# Patient Record
Sex: Female | Born: 1960 | Race: White | Hispanic: No | Marital: Single | Smoking: Former smoker
Health system: Southern US, Community
[De-identification: ages and names within clinical notes are randomized; demographics above are authoritative.]

## PROBLEM LIST (undated history)

## (undated) DIAGNOSIS — R200 Anesthesia of skin: Secondary | ICD-10-CM

## (undated) DIAGNOSIS — K589 Irritable bowel syndrome without diarrhea: Secondary | ICD-10-CM

## (undated) DIAGNOSIS — F419 Anxiety disorder, unspecified: Secondary | ICD-10-CM

## (undated) DIAGNOSIS — I749 Embolism and thrombosis of unspecified artery: Secondary | ICD-10-CM

## (undated) DIAGNOSIS — E039 Hypothyroidism, unspecified: Secondary | ICD-10-CM

## (undated) DIAGNOSIS — R1310 Dysphagia, unspecified: Secondary | ICD-10-CM

## (undated) DIAGNOSIS — F329 Major depressive disorder, single episode, unspecified: Secondary | ICD-10-CM

## (undated) DIAGNOSIS — M1990 Unspecified osteoarthritis, unspecified site: Secondary | ICD-10-CM

## (undated) DIAGNOSIS — Z936 Other artificial openings of urinary tract status: Secondary | ICD-10-CM

## (undated) DIAGNOSIS — C801 Malignant (primary) neoplasm, unspecified: Secondary | ICD-10-CM

## (undated) DIAGNOSIS — M069 Rheumatoid arthritis, unspecified: Secondary | ICD-10-CM

## (undated) DIAGNOSIS — K222 Esophageal obstruction: Secondary | ICD-10-CM

## (undated) DIAGNOSIS — K635 Polyp of colon: Secondary | ICD-10-CM

## (undated) DIAGNOSIS — F32A Depression, unspecified: Secondary | ICD-10-CM

## (undated) DIAGNOSIS — G629 Polyneuropathy, unspecified: Secondary | ICD-10-CM

## (undated) DIAGNOSIS — E785 Hyperlipidemia, unspecified: Secondary | ICD-10-CM

## (undated) DIAGNOSIS — K08109 Complete loss of teeth, unspecified cause, unspecified class: Secondary | ICD-10-CM

## (undated) DIAGNOSIS — K5900 Constipation, unspecified: Secondary | ICD-10-CM

## (undated) DIAGNOSIS — I10 Essential (primary) hypertension: Secondary | ICD-10-CM

## (undated) DIAGNOSIS — Z972 Presence of dental prosthetic device (complete) (partial): Secondary | ICD-10-CM

## (undated) DIAGNOSIS — R002 Palpitations: Secondary | ICD-10-CM

## (undated) DIAGNOSIS — R079 Chest pain, unspecified: Secondary | ICD-10-CM

## (undated) DIAGNOSIS — K219 Gastro-esophageal reflux disease without esophagitis: Secondary | ICD-10-CM

## (undated) DIAGNOSIS — R221 Localized swelling, mass and lump, neck: Secondary | ICD-10-CM

## (undated) DIAGNOSIS — R202 Paresthesia of skin: Secondary | ICD-10-CM

## (undated) DIAGNOSIS — Z973 Presence of spectacles and contact lenses: Secondary | ICD-10-CM

## (undated) DIAGNOSIS — K649 Unspecified hemorrhoids: Secondary | ICD-10-CM

## (undated) HISTORY — PX: TONSILLECTOMY: SUR1361

## (undated) HISTORY — DX: Rheumatoid arthritis, unspecified: M06.9

## (undated) HISTORY — DX: Malignant (primary) neoplasm, unspecified: C80.1

## (undated) HISTORY — DX: Depression, unspecified: F32.A

## (undated) HISTORY — DX: Polyp of colon: K63.5

## (undated) HISTORY — DX: Essential (primary) hypertension: I10

## (undated) HISTORY — PX: UPPER GI ENDOSCOPY: SHX6162

## (undated) HISTORY — DX: Chest pain, unspecified: R07.9

## (undated) HISTORY — DX: Dysphagia, unspecified: R13.10

## (undated) HISTORY — DX: Embolism and thrombosis of unspecified artery: I74.9

## (undated) HISTORY — DX: Hyperlipidemia, unspecified: E78.5

## (undated) HISTORY — PX: THYROID SURGERY: SHX805

## (undated) HISTORY — DX: Gastro-esophageal reflux disease without esophagitis: K21.9

## (undated) HISTORY — PX: ABDOMINAL HYSTERECTOMY: SHX81

## (undated) HISTORY — PX: ABLATION: SHX5711

## (undated) HISTORY — DX: Constipation, unspecified: K59.00

## (undated) HISTORY — DX: Major depressive disorder, single episode, unspecified: F32.9

## (undated) HISTORY — PX: COLONOSCOPY: SHX174

## (undated) HISTORY — DX: Localized swelling, mass and lump, neck: R22.1

## (undated) HISTORY — DX: Palpitations: R00.2

---

## 1998-10-18 ENCOUNTER — Emergency Department (HOSPITAL_COMMUNITY): Admission: EM | Admit: 1998-10-18 | Discharge: 1998-10-18 | Payer: Self-pay | Admitting: Emergency Medicine

## 2000-10-21 ENCOUNTER — Emergency Department (HOSPITAL_COMMUNITY): Admission: EM | Admit: 2000-10-21 | Discharge: 2000-10-21 | Payer: Self-pay | Admitting: Emergency Medicine

## 2000-10-21 ENCOUNTER — Encounter: Payer: Self-pay | Admitting: Emergency Medicine

## 2004-09-25 ENCOUNTER — Ambulatory Visit: Payer: Self-pay | Admitting: Internal Medicine

## 2007-03-15 ENCOUNTER — Emergency Department: Payer: Self-pay

## 2007-03-15 ENCOUNTER — Other Ambulatory Visit: Payer: Self-pay

## 2007-06-22 ENCOUNTER — Ambulatory Visit: Payer: Self-pay | Admitting: Family Medicine

## 2007-06-22 DIAGNOSIS — E663 Overweight: Secondary | ICD-10-CM | POA: Insufficient documentation

## 2007-06-22 DIAGNOSIS — F329 Major depressive disorder, single episode, unspecified: Secondary | ICD-10-CM | POA: Insufficient documentation

## 2007-06-22 DIAGNOSIS — F17200 Nicotine dependence, unspecified, uncomplicated: Secondary | ICD-10-CM | POA: Insufficient documentation

## 2007-06-22 DIAGNOSIS — I10 Essential (primary) hypertension: Secondary | ICD-10-CM | POA: Insufficient documentation

## 2007-06-22 DIAGNOSIS — K219 Gastro-esophageal reflux disease without esophagitis: Secondary | ICD-10-CM | POA: Insufficient documentation

## 2007-06-22 DIAGNOSIS — M1990 Unspecified osteoarthritis, unspecified site: Secondary | ICD-10-CM | POA: Insufficient documentation

## 2007-06-23 ENCOUNTER — Encounter: Payer: Self-pay | Admitting: Family Medicine

## 2007-06-23 LAB — CONVERTED CEMR LAB
ALT: 14 units/L (ref 0–35)
AST: 15 units/L (ref 0–37)
Albumin: 3.7 g/dL (ref 3.5–5.2)
Alkaline Phosphatase: 78 units/L (ref 39–117)
BUN: 7 mg/dL (ref 6–23)
Basophils Absolute: 0 10*3/uL (ref 0.0–0.1)
Basophils Relative: 0.3 % (ref 0.0–1.0)
Bilirubin, Direct: 0.2 mg/dL (ref 0.0–0.3)
CO2: 29 meq/L (ref 19–32)
Calcium: 9 mg/dL (ref 8.4–10.5)
Chloride: 105 meq/L (ref 96–112)
Cholesterol: 218 mg/dL (ref 0–200)
Creatinine, Ser: 0.7 mg/dL (ref 0.4–1.2)
Direct LDL: 167.2 mg/dL
Eosinophils Absolute: 0.2 10*3/uL (ref 0.0–0.6)
Eosinophils Relative: 3.3 % (ref 0.0–5.0)
GFR calc Af Amer: 115 mL/min
GFR calc non Af Amer: 95 mL/min
Glucose, Bld: 73 mg/dL (ref 70–99)
HCT: 42 % (ref 36.0–46.0)
HDL: 23.6 mg/dL — ABNORMAL LOW (ref >39.0)
Hemoglobin: 13.9 g/dL (ref 12.0–15.0)
Lymphocytes Relative: 34.8 % (ref 12.0–46.0)
MCHC: 33 g/dL (ref 30.0–36.0)
MCV: 89.1 fL (ref 78.0–100.0)
Monocytes Absolute: 0.4 10*3/uL (ref 0.2–0.7)
Monocytes Relative: 5.5 % (ref 3.0–11.0)
Neutro Abs: 4.2 10*3/uL (ref 1.4–7.7)
Neutrophils Relative %: 56.1 % (ref 43.0–77.0)
Platelets: 380 10*3/uL (ref 150–400)
Potassium: 4.1 meq/L (ref 3.5–5.1)
RBC: 4.71 M/uL (ref 3.87–5.11)
RDW: 12.8 % (ref 11.5–14.6)
Sodium: 142 meq/L (ref 135–145)
TSH: 1.88 microintl units/mL (ref 0.35–5.50)
Total Bilirubin: 0.7 mg/dL (ref 0.3–1.2)
Total CHOL/HDL Ratio: 9.2
Total Protein: 6.1 g/dL (ref 6.0–8.3)
Triglycerides: 150 mg/dL — ABNORMAL HIGH (ref 0–149)
VLDL: 30 mg/dL (ref 0–40)
WBC: 7.4 10*3/uL (ref 4.5–10.5)

## 2007-07-22 ENCOUNTER — Ambulatory Visit: Payer: Self-pay | Admitting: Family Medicine

## 2008-06-16 ENCOUNTER — Encounter: Admission: RE | Admit: 2008-06-16 | Discharge: 2008-06-16 | Payer: Self-pay | Admitting: Family Medicine

## 2009-07-05 ENCOUNTER — Encounter: Admission: RE | Admit: 2009-07-05 | Discharge: 2009-07-05 | Payer: Self-pay | Admitting: Family Medicine

## 2010-04-21 ENCOUNTER — Emergency Department: Payer: Self-pay | Admitting: Emergency Medicine

## 2010-06-18 NOTE — Assessment & Plan Note (Signed)
Summary: new to be est patient fasting/mhf   Vital Signs:  Patient Profile:   50 Years Old Female Height:     68 inches Weight:      213 pounds Temp:     97.5 degrees F oral Pulse rate:   78 / minute Pulse rhythm:   regular BP sitting:   172 / 114  (left arm) Cuff size:   large  Vitals Entered By: Alfred Levins, CMA (June 22, 2007 10:42 AM)                 Chief Complaint:  establish.  History of Present Illness: 50 yr old female to establish with [place]. Has not had a primary care doctor for at least 10 years. Feels good. Wants to quit smoking and lose some weight. Has a 3 month supply of Chantix at home which she plans to start taking soon. Is fasting today.   Current Allergies (reviewed today): No known allergies   Past Medical History:    Reviewed history and no changes required:       Depression       GERD       Hypertension       Osteoarthritis  Past Surgical History:    Reviewed history and no changes required:       Tonsillectomy   Family History:    Reviewed history and no changes required:       Family History Diabetes 1st degree relative       Family History High cholesterol       Family History Hypertension       Family History of Stroke M 1st degree relative <50       Family History of Heart disease  Social History:    Reviewed history and no changes required:       Single       Current Smoker       Alcohol use-no       Drug use-no       Regular exercise-no   Risk Factors:  Tobacco use:  current Drug use:  no Alcohol use:  no Exercise:  no   Review of Systems  The patient denies anorexia, fever, weight loss, vision loss, decreased hearing, hoarseness, chest pain, syncope, dyspnea on exhertion, peripheral edema, prolonged cough, hemoptysis, abdominal pain, melena, hematochezia, severe indigestion/heartburn, hematuria, incontinence, genital sores, muscle weakness, suspicious skin lesions, transient blindness, difficulty walking,  depression, unusual weight change, abnormal bleeding, enlarged lymph nodes, angioedema, breast masses, and testicular masses.     Physical Exam  General:     overweight-appearing.   Neck:     No deformities, masses, or tenderness noted. Lungs:     Normal respiratory effort, chest expands symmetrically. Lungs are clear to auscultation, no crackles or wheezes. Heart:     Normal rate and regular rhythm. S1 and S2 normal without gallop, murmur, click, rub or other extra sounds.    Impression & Recommendations:  Problem # 1:  HYPERTENSION (ICD-401.9)  Orders: UA Dipstick w/o Micro (81002) Venipuncture (91478) TLB-Lipid Panel (80061-LIPID) TLB-BMP (Basic Metabolic Panel-BMET) (80048-METABOL) TLB-CBC Platelet - w/Differential (85025-CBCD) TLB-Hepatic/Liver Function Pnl (80076-HEPATIC) TLB-TSH (Thyroid Stimulating Hormone) (84443-TSH)  Her updated medication list for this problem includes:    Diovan Hct 160-25 Mg Tabs (Valsartan-hydrochlorothiazide) ..... Once daily   Problem # 2:  NICOTINE ADDICTION (ICD-305.1)  Problem # 3:  OVERWEIGHT (ICD-278.02)  Complete Medication List: 1)  Diovan Hct 160-25 Mg Tabs (Valsartan-hydrochlorothiazide) .... Once daily  Patient Instructions: 1)  We discussed the importance of getting exercise, watching her diet, and losing weight. Encouraged her to begin Chantix as well as a daily nicotine patch to stop smoking. Will get labs today. Start Diovan HCT. Recheck with cpx in 3-4 weeks.    Prescriptions: DIOVAN HCT 160-25 MG  TABS (VALSARTAN-HYDROCHLOROTHIAZIDE) once daily  #28 x 0   Entered and Authorized by:   Nelwyn Salisbury MD   Signed by:   Nelwyn Salisbury MD on 06/22/2007   Method used:   Samples Given   RxID:   [id number]  ]  Appended Document: Orders Update    Clinical Lists Changes  Observations: Added new observation of COMMENTS: ..................................................................Marland KitchenWynona Canes, CMA   June 23, 2007 8:24 AM  (06/23/2007 8:24) Added new observation of PH URINE: 6.0  (06/23/2007 8:24) Added new observation of SPEC GR URIN: 1.020  (06/23/2007 8:24) Added new observation of APPEARANCE U: Clear  (06/23/2007 8:24) Added new observation of UA COLOR: yellow  (06/23/2007 8:24) Added new observation of WBC DIPSTK U: negative  (06/23/2007 8:24) Added new observation of NITRITE URN: negative  (06/23/2007 8:24) Added new observation of UROBILINOGEN: 0.2  (06/23/2007 8:24) Added new observation of PROTEIN, URN: negative  (06/23/2007 8:24) Added new observation of BLOOD UR DIP: 2+  (06/23/2007 8:24) Added new observation of KETONES URN: negative  (06/23/2007 8:24) Added new observation of BILIRUBIN UR: negative  (06/23/2007 8:24) Added new observation of GLUCOSE, URN: 100  (06/23/2007 8:24)       Laboratory Results   Urine Tests   Date/Time Reported: June 23, 2007 8:24 AM   Routine Urinalysis   Color: yellow Appearance: Clear Glucose: 100   (Normal Range: Negative) Bilirubin: negative   (Normal Range: Negative) Ketone: negative   (Normal Range: Negative) Spec. Gravity: 1.020   (Normal Range: 1.003-1.035) Blood: 2+   (Normal Range: Negative) pH: 6.0   (Normal Range: 5.0-8.0) Protein: negative   (Normal Range: Negative) Urobilinogen: 0.2   (Normal Range: 0-1) Nitrite: negative   (Normal Range: Negative) Leukocyte Esterace: negative   (Normal Range: Negative)    Comments: ..................................................................Marland KitchenWynona Canes, CMA  June 23, 2007 8:24 AM

## 2010-06-18 NOTE — Letter (Signed)
Summary: Lipid Letter  [facility]  [address] [place], [place] [address]   Phone: [phone]  Fax: [phone]    06/23/2007  Donna [name] [address] [place], [place]  [address]  Dear Donna [name]:  We have carefully reviewed your last lipid profile from 06/22/2007 and the results are noted below with a summary of recommendations for lipid management.    Cholesterol:       218     Goal: <   HDL "good" Cholesterol:   29.5     Goal: >   LDL "bad" Cholesterol:   DEL     Goal: <   Triglycerides:       150     Goal: <    Your labs are normal except your cholesterol is high.  Please watch your diet.    TLC Diet (Therapeutic Lifestyle Change): Saturated Fats & Transfatty acids should be kept < 7% of total calories ***Reduce Saturated Fats Polyunstaurated Fat can be up to 10% of total calories Monounsaturated Fat Fat can be up to 20% of total calories Total Fat should be no greater than 25-35% of total calories Carbohydrates should be 50-60% of total calories Protein should be approximately 15% of total calories Fiber should be at least 20-30 grams a day ***Increased fiber may help lower LDL Total Cholesterol should be < 200mg /day Consider adding plant stanol/sterols to diet (example: Benacol spread) ***A higher intake of unsaturated fat may reduce Triglycerides and Increase HDL    Adjunctive Measures (may lower LIPIDS and reduce risk of Heart Attack) include: Aerobic Exercise (20-30 minutes 3-4 times a week) Limit Alcohol Consumption Weight Reduction Aspirin 75-81 mg a day by mouth (if not allergic or contraindicated) Vitamin E 400 IU a day by mouth Folic Acid 1mg  a day by mouth Dietary Fiber 20-30 grams a day by mouth   If you have any questions, please call. We appreciate being able to work with you.   Sincerely,    [place] [facility] [employer]

## 2010-06-18 NOTE — Assessment & Plan Note (Signed)
Summary: cpx/pap/njr ok per doc/njr   Vital Signs:  Patient Profile:   50 Years Old Female Height:     68 inches Weight:      213 pounds Temp:     97.9 degrees F oral Pulse rate:   78 / minute Pulse rhythm:   regular BP sitting:   112 / 74  (left arm) Cuff size:   large  Vitals Entered By: Alfred Levins, CMA (July 22, 2007 10:12 AM)                 Chief Complaint:  bp check.  History of Present Illness: Here to follow up HTN. Has been on Diovan HCT, and is doing well. BP is well controlled and she feels fine. Recent labs were normal except high cholesterol.    Current Allergies: No known allergies   Past Medical History:    Reviewed history from 06/22/2007 and no changes required:       Depression       GERD       Hypertension       Osteoarthritis     Review of Systems  The patient denies anorexia, fever, weight loss, weight gain, vision loss, decreased hearing, hoarseness, chest pain, syncope, dyspnea on exhertion, peripheral edema, prolonged cough, hemoptysis, abdominal pain, melena, hematochezia, severe indigestion/heartburn, hematuria, incontinence, genital sores, muscle weakness, suspicious skin lesions, transient blindness, difficulty walking, depression, unusual weight change, abnormal bleeding, enlarged lymph nodes, angioedema, breast masses, and testicular masses.     Physical Exam  General:     Well-developed,well-nourished,in no acute distress; alert,appropriate and cooperative throughout examination    Impression & Recommendations:  Problem # 1:  HYPERTENSION (ICD-401.9)  Her updated medication list for this problem includes:    Diovan Hct 160-25 Mg Tabs (Valsartan-hydrochlorothiazide) ..... Once daily   Complete Medication List: 1)  Diovan Hct 160-25 Mg Tabs (Valsartan-hydrochlorothiazide) .... Once daily   Patient Instructions: 1)  Continue meds, diet, and exercise. Plan to do cpx soon.    Prescriptions: DIOVAN HCT 160-25 MG  TABS  (VALSARTAN-HYDROCHLOROTHIAZIDE) once daily  #30 x 11   Entered and Authorized by:   Nelwyn Salisbury MD   Signed by:   Nelwyn Salisbury MD on 07/22/2007   Method used:   Electronically sent to ...       [facility]       [address]       [place], [place]  [address]       Ph: [phone]       Fax: [phone]   RxID:   [id number]  ]

## 2010-07-18 LAB — HM COLONOSCOPY

## 2011-06-17 ENCOUNTER — Inpatient Hospital Stay: Payer: Self-pay | Admitting: Internal Medicine

## 2011-06-17 LAB — BASIC METABOLIC PANEL
Anion Gap: 10 (ref 7–16)
BUN: 11 mg/dL (ref 7–18)
Calcium, Total: 9.2 mg/dL (ref 8.5–10.1)
Chloride: 104 mmol/L (ref 98–107)
Co2: 29 mmol/L (ref 21–32)
Creatinine: 0.74 mg/dL (ref 0.60–1.30)
EGFR (African American): >60
EGFR (Non-African Amer.): >60
Glucose: 96 mg/dL (ref 65–99)
Osmolality: 284 (ref 275–301)
Potassium: 3.1 mmol/L — ABNORMAL LOW (ref 3.5–5.1)
Sodium: 143 mmol/L (ref 136–145)

## 2011-06-17 LAB — CBC
HCT: 40.8 % (ref 35.0–47.0)
HGB: 14 g/dL (ref 12.0–16.0)
MCH: 29.9 pg (ref 26.0–34.0)
MCHC: 34.3 g/dL (ref 32.0–36.0)
MCV: 87 fL (ref 80–100)
Platelet: 354 10*3/uL (ref 150–440)
RBC: 4.67 10*6/uL (ref 3.80–5.20)
RDW: 13.4 % (ref 11.5–14.5)
WBC: 9.5 10*3/uL (ref 3.6–11.0)

## 2011-06-17 LAB — TROPONIN I: Troponin-I: 0.06 ng/mL — ABNORMAL HIGH

## 2011-06-18 DIAGNOSIS — R079 Chest pain, unspecified: Secondary | ICD-10-CM

## 2011-06-18 LAB — CK TOTAL AND CKMB (NOT AT ARMC)
CK, Total: 65 U/L (ref 21–215)
CK, Total: 49 U/L (ref 21–215)
CK, Total: 51 U/L (ref 21–215)
CK-MB: 1.3 ng/mL (ref 0.5–3.6)
CK-MB: 1.2 ng/mL (ref 0.5–3.6)
CK-MB: 0.9 ng/mL (ref 0.5–3.6)

## 2011-06-18 LAB — LIPID PANEL
Cholesterol: 183 mg/dL (ref 0–200)
HDL Cholesterol: 28 mg/dL — ABNORMAL LOW (ref 40–60)
Ldl Cholesterol, Calc: 128 mg/dL — ABNORMAL HIGH (ref 0–100)
Triglycerides: 133 mg/dL (ref 0–200)
VLDL Cholesterol, Calc: 27 mg/dL (ref 5–40)

## 2011-06-18 LAB — TROPONIN I
Troponin-I: 0.06 ng/mL — ABNORMAL HIGH
Troponin-I: 0.11 ng/mL — ABNORMAL HIGH

## 2011-09-09 ENCOUNTER — Other Ambulatory Visit: Payer: Self-pay | Admitting: Family Medicine

## 2011-09-09 DIAGNOSIS — E0590 Thyrotoxicosis, unspecified without thyrotoxic crisis or storm: Secondary | ICD-10-CM

## 2011-09-17 ENCOUNTER — Encounter (HOSPITAL_COMMUNITY)
Admission: RE | Admit: 2011-09-17 | Discharge: 2011-09-17 | Disposition: A | Discharge status: Home / Self Care | Payer: PRIVATE HEALTH INSURANCE | Source: Ambulatory Visit | Attending: Family Medicine | Admitting: Family Medicine

## 2011-09-17 DIAGNOSIS — E0590 Thyrotoxicosis, unspecified without thyrotoxic crisis or storm: Secondary | ICD-10-CM | POA: Insufficient documentation

## 2011-09-18 ENCOUNTER — Encounter (HOSPITAL_COMMUNITY)
Admission: RE | Admit: 2011-09-18 | Discharge: 2011-09-18 | Disposition: A | Discharge status: Home / Self Care | Payer: PRIVATE HEALTH INSURANCE | Source: Ambulatory Visit | Attending: Family Medicine | Admitting: Family Medicine

## 2011-09-18 MED ORDER — SODIUM IODIDE I 131 CAPSULE
8.0000 | Freq: Once | INTRAVENOUS | Status: AC | PRN
  Administered 2011-09-17: 8 via ORAL

## 2011-09-18 MED ORDER — SODIUM PERTECHNETATE TC 99M INJECTION
10.6000 | Freq: Once | INTRAVENOUS | Status: AC | PRN
  Administered 2011-09-18: 10.6 via INTRAVENOUS

## 2011-09-22 ENCOUNTER — Other Ambulatory Visit: Payer: Self-pay | Admitting: Family Medicine

## 2011-09-22 DIAGNOSIS — E042 Nontoxic multinodular goiter: Secondary | ICD-10-CM

## 2011-09-25 ENCOUNTER — Ambulatory Visit
Admission: RE | Admit: 2011-09-25 | Discharge: 2011-09-25 | Disposition: A | Discharge status: Home / Self Care | Payer: PRIVATE HEALTH INSURANCE | Source: Ambulatory Visit | Attending: Family Medicine | Admitting: Family Medicine

## 2011-09-25 DIAGNOSIS — E042 Nontoxic multinodular goiter: Secondary | ICD-10-CM

## 2011-09-30 ENCOUNTER — Other Ambulatory Visit: Payer: Self-pay | Admitting: Family Medicine

## 2011-09-30 DIAGNOSIS — E042 Nontoxic multinodular goiter: Secondary | ICD-10-CM

## 2011-10-08 ENCOUNTER — Ambulatory Visit
Admission: RE | Admit: 2011-10-08 | Discharge: 2011-10-08 | Disposition: A | Discharge status: Home / Self Care | Payer: PRIVATE HEALTH INSURANCE | Source: Ambulatory Visit | Attending: Family Medicine | Admitting: Family Medicine

## 2011-10-08 ENCOUNTER — Other Ambulatory Visit (HOSPITAL_COMMUNITY)
Admission: RE | Admit: 2011-10-08 | Discharge: 2011-10-08 | Disposition: A | Discharge status: Home / Self Care | Payer: PRIVATE HEALTH INSURANCE | Source: Ambulatory Visit | Attending: Diagnostic Radiology | Admitting: Diagnostic Radiology

## 2011-10-08 DIAGNOSIS — E042 Nontoxic multinodular goiter: Secondary | ICD-10-CM

## 2011-10-08 DIAGNOSIS — E041 Nontoxic single thyroid nodule: Secondary | ICD-10-CM | POA: Insufficient documentation

## 2011-10-15 ENCOUNTER — Other Ambulatory Visit: Payer: Self-pay | Admitting: Family Medicine

## 2011-10-15 DIAGNOSIS — E0590 Thyrotoxicosis, unspecified without thyrotoxic crisis or storm: Secondary | ICD-10-CM

## 2011-10-23 ENCOUNTER — Ambulatory Visit (HOSPITAL_COMMUNITY): Payer: PRIVATE HEALTH INSURANCE

## 2011-11-04 ENCOUNTER — Encounter (HOSPITAL_COMMUNITY)
Admission: RE | Admit: 2011-11-04 | Discharge: 2011-11-04 | Disposition: A | Discharge status: Home / Self Care | Payer: PRIVATE HEALTH INSURANCE | Source: Ambulatory Visit | Attending: Family Medicine | Admitting: Family Medicine

## 2011-11-04 DIAGNOSIS — E0590 Thyrotoxicosis, unspecified without thyrotoxic crisis or storm: Secondary | ICD-10-CM | POA: Insufficient documentation

## 2011-11-04 MED ORDER — SODIUM IODIDE I 131 CAPSULE
14.8800 | Freq: Once | INTRAVENOUS | Status: AC | PRN
  Administered 2011-11-04: 14.88 via ORAL

## 2012-04-13 ENCOUNTER — Encounter (INDEPENDENT_AMBULATORY_CARE_PROVIDER_SITE_OTHER): Payer: Self-pay | Admitting: General Surgery

## 2012-04-20 ENCOUNTER — Ambulatory Visit (INDEPENDENT_AMBULATORY_CARE_PROVIDER_SITE_OTHER): Payer: No Typology Code available for payment source | Admitting: General Surgery

## 2012-04-20 ENCOUNTER — Encounter (INDEPENDENT_AMBULATORY_CARE_PROVIDER_SITE_OTHER): Payer: Self-pay | Admitting: General Surgery

## 2012-04-20 VITALS — BP 126/78 | HR 76 | Temp 97.9°F | Resp 16 | Ht 67.5 in | Wt 219.1 lb

## 2012-04-20 DIAGNOSIS — L723 Sebaceous cyst: Secondary | ICD-10-CM

## 2012-04-20 DIAGNOSIS — L720 Epidermal cyst: Secondary | ICD-10-CM

## 2012-04-20 NOTE — Progress Notes (Signed)
Patient ID: Donna [name], female   DOB: August 30, 1960, 51 y.o.   MRN: [id number]  Chief Complaint  Patient presents with  . Mass    Neck    HPI Donna [name] is a 51 y.o. female.  The patient is referred by Dr. Gailen Shelter for a left neck cyst. Patient has since been there for several years. She states that it becomes erythematous but has never drained. She states is no pain involved. She does have a history of having cyst removed on her scalp. HPI  Past Medical History  Diagnosis Date  . Hypertension   . Colon polyp   . Graves disease   . GERD (gastroesophageal reflux disease)   . Hyperlipidemia   . Palpitations   . Constipation   . Neck mass   . Trouble swallowing   . Chest pain     Past Surgical History  Procedure Date  . Tonsillectomy   . Thyroid surgery 10/2011 - approximate    ablation    Family History  Problem Relation Age of Onset  . Heart attack Mother   . CVA Father   . Cancer Brother     lung cancer - stage 4  . Cancer Brother     skin     Social History History  Substance Use Topics  . Smoking status: Former Smoker    Quit date: 06/19/2011  . Smokeless tobacco: Not on file  . Alcohol Use: No    No Known Allergies  Current Outpatient Prescriptions  Medication Sig Dispense Refill  . amLODipine (NORVASC) 5 MG tablet Take 5 mg by mouth daily.      . hydrochlorothiazide (MICROZIDE) 12.5 MG capsule Take 12.5 mg by mouth daily.      Donna Kitchen levothyroxine (SYNTHROID, LEVOTHROID) 100 MCG tablet Take 100 mcg by mouth daily.      . simvastatin (ZOCOR) 20 MG tablet Take 20 mg by mouth every evening.        Review of Systems Review of Systems  All other systems reviewed and are negative.    Blood pressure 126/78, pulse 76, temperature 97.9 F (36.6 C), temperature source Temporal, resp. rate 16, height 5' 7.5" (1.715 m), weight 219 lb 2 oz (99.394 kg).  Physical Exam Physical Exam  Constitutional: She appears well-developed and well-nourished.  HENT:  Head:  Normocephalic and atraumatic.  Eyes: Conjunctivae normal and EOM are normal. Pupils are equal, round, and reactive to light.  Neck: Normal range of motion. Neck supple.    Cardiovascular: Normal rate, regular rhythm and normal heart sounds.   Pulmonary/Chest: Effort normal and breath sounds normal.  Abdominal: Soft. Bowel sounds are normal.  Musculoskeletal: Normal range of motion.    Data Reviewed none  Assessment    51year-old female with a left subcutaneous cyst approximately 1 x 2 cm    Plan    1. We'll proceed to the operating room for excision of the left neck subcutaneous cyst.  2. All risks and benefits were discussed with the patient, to generally include infection, bleeding, damage to surrounding structures, and recurrence. Alternatives were offered and described.  All questions were answered and the patient voiced understanding of the procedure and wishes to proceed at this point.        Marigene [name]., Donna [name] 04/20/2012, 3:27 PM

## 2012-04-21 ENCOUNTER — Ambulatory Visit (INDEPENDENT_AMBULATORY_CARE_PROVIDER_SITE_OTHER): Payer: PRIVATE HEALTH INSURANCE | Admitting: General Surgery

## 2012-05-07 ENCOUNTER — Encounter (HOSPITAL_BASED_OUTPATIENT_CLINIC_OR_DEPARTMENT_OTHER): Payer: Self-pay | Admitting: *Deleted

## 2012-05-07 NOTE — Progress Notes (Signed)
To come in for bmet 

## 2012-05-10 ENCOUNTER — Ambulatory Visit (HOSPITAL_COMMUNITY)
Admission: RE | Admit: 2012-05-10 | Discharge: 2012-05-10 | Disposition: A | Discharge status: Home / Self Care | Payer: PRIVATE HEALTH INSURANCE | Source: Ambulatory Visit | Attending: Anesthesiology | Admitting: Anesthesiology

## 2012-05-10 ENCOUNTER — Encounter (HOSPITAL_COMMUNITY): Payer: Self-pay | Admitting: Pharmacy Technician

## 2012-05-10 ENCOUNTER — Other Ambulatory Visit (HOSPITAL_COMMUNITY): Payer: PRIVATE HEALTH INSURANCE

## 2012-05-10 ENCOUNTER — Encounter (HOSPITAL_COMMUNITY)
Admission: RE | Admit: 2012-05-10 | Discharge: 2012-05-10 | Disposition: A | Discharge status: Home / Self Care | Payer: PRIVATE HEALTH INSURANCE | Source: Ambulatory Visit | Attending: General Surgery | Admitting: General Surgery

## 2012-05-10 ENCOUNTER — Encounter (HOSPITAL_COMMUNITY): Payer: Self-pay

## 2012-05-10 DIAGNOSIS — Z01810 Encounter for preprocedural cardiovascular examination: Secondary | ICD-10-CM | POA: Insufficient documentation

## 2012-05-10 DIAGNOSIS — I10 Essential (primary) hypertension: Secondary | ICD-10-CM | POA: Insufficient documentation

## 2012-05-10 DIAGNOSIS — Z01818 Encounter for other preprocedural examination: Secondary | ICD-10-CM | POA: Insufficient documentation

## 2012-05-10 DIAGNOSIS — R05 Cough: Secondary | ICD-10-CM | POA: Insufficient documentation

## 2012-05-10 DIAGNOSIS — R059 Cough, unspecified: Secondary | ICD-10-CM | POA: Insufficient documentation

## 2012-05-10 DIAGNOSIS — Z87891 Personal history of nicotine dependence: Secondary | ICD-10-CM | POA: Insufficient documentation

## 2012-05-10 DIAGNOSIS — Z01812 Encounter for preprocedural laboratory examination: Secondary | ICD-10-CM | POA: Insufficient documentation

## 2012-05-10 DIAGNOSIS — I517 Cardiomegaly: Secondary | ICD-10-CM | POA: Insufficient documentation

## 2012-05-10 HISTORY — DX: Irritable bowel syndrome, unspecified: K58.9

## 2012-05-10 HISTORY — DX: Paresthesia of skin: R20.2

## 2012-05-10 HISTORY — DX: Esophageal obstruction: K22.2

## 2012-05-10 HISTORY — DX: Unspecified osteoarthritis, unspecified site: M19.90

## 2012-05-10 HISTORY — DX: Polyneuropathy, unspecified: G62.9

## 2012-05-10 HISTORY — DX: Unspecified hemorrhoids: K64.9

## 2012-05-10 HISTORY — DX: Anesthesia of skin: R20.0

## 2012-05-10 HISTORY — DX: Anxiety disorder, unspecified: F41.9

## 2012-05-10 HISTORY — DX: Hypothyroidism, unspecified: E03.9

## 2012-05-10 LAB — BASIC METABOLIC PANEL
BUN: 16 mg/dL (ref 6–23)
CO2: 29 mEq/L (ref 19–32)
Calcium: 9.7 mg/dL (ref 8.4–10.5)
Chloride: 101 mEq/L (ref 96–112)
Creatinine, Ser: 0.87 mg/dL (ref 0.50–1.10)
GFR calc Af Amer: 87 mL/min — ABNORMAL LOW (ref >90)
GFR calc non Af Amer: 75 mL/min — ABNORMAL LOW (ref >90)
Glucose, Bld: 91 mg/dL (ref 70–99)
Potassium: 3.7 mEq/L (ref 3.5–5.1)
Sodium: 140 mEq/L (ref 135–145)

## 2012-05-10 LAB — CBC
HCT: 38.3 % (ref 36.0–46.0)
Hemoglobin: 13.1 g/dL (ref 12.0–15.0)
MCH: 28.9 pg (ref 26.0–34.0)
MCHC: 34.2 g/dL (ref 30.0–36.0)
MCV: 84.4 fL (ref 78.0–100.0)
Platelets: 329 10*3/uL (ref 150–400)
RBC: 4.54 MIL/uL (ref 3.87–5.11)
RDW: 12.4 % (ref 11.5–15.5)
WBC: 10 10*3/uL (ref 4.0–10.5)

## 2012-05-10 LAB — SURGICAL PCR SCREEN
MRSA, PCR: NEGATIVE
Staphylococcus aureus: POSITIVE — AB

## 2012-05-10 NOTE — Progress Notes (Addendum)
Pt was seen at [facility] in  January of this year with chest pain.  Pt said EKG was abnormal and 2 sets of Tropin were elevated.  Pt had a stress test during her stay and was seen by a cardiologist.  Pt did not have to have a follow up with a cardiologist.  Pt denies chest pain and has quit smoking 06/19/11.    I requested discarge notes and stress test and EKG and echo from [facility] and left chart for Revonda Standard to  review.

## 2012-05-10 NOTE — Pre-Procedure Instructions (Signed)
20 JAIDYNN BALSTER  05/10/2012   Your procedure is scheduled on:  Thursday, December 26th.  Report to [facility] at 11:00 AM.  Call this number if you have problems the morning of surgery: [phone]   Remember: Nothing to eat or drink after Midnight.     Take these medicines the morning of surgery with A SIP OF WATER: Amlodipine (Norvasc), Famotidine (Pepcid), Levothyroxine).  May take Acetaminophen (Tylenol) if needed.     Stop taking Ibuprofen.    Do not wear jewelry, make-up or nail polish.  Do not wear lotions, powders, or perfumes. You may wear deodorant.  Do not shave 48 hours prior to surgery. Men may shave face and neck.  Do not bring valuables to the hospital.  Contacts, dentures or bridgework may not be worn into surgery.  Leave suitcase in the car. After surgery it may be brought to your room.  For patients admitted to the hospital, checkout time is 11:00 AM the day of discharge.   Patients discharged the day of surgery will not be allowed to drive home.  Name and phone number of your driver:-   Special Instructions: Shower using CHG 2 nights before surgery and the night before surgery.  If you shower the day of surgery use CHG.  Use special wash - you have one bottle of CHG for all showers.  You should use approximately 1/3 of the bottle for each shower.   Please read over the following fact sheets that you were given: Pain Booklet, Coughing and Deep Breathing and Surgical Site Infection Prevention

## 2012-05-11 NOTE — Consult Note (Signed)
Anesthesia Chart Review:  Patient is a 51 year old female scheduled for excision of left neck subcutaneous cyst by Dr. Derrell Lolling on 05/13/12.  History includes obesity, former smoker, hypertension, hyperlipidemia, palpitations, IBS, GERD, hypothyroidism, anxiety, chest pain with history of normal stress test 06/18/11, neuropathy, Schatzki's ring.  PCP is listed as Dr. Lynnea Ferrier.  EKG on 05/10/12 showed NSR, possible LAE, poor r wave progression, non-specific T wave abnormality.  It does not appear significantly changed since at least 06/18/11.  Nuclear stress test on 06/18/11 [facility]) showed: Exercise myocardial perfusion imaging study was no significant ischemia noted. Normal perfusion and normal wall motion was normal ejection fraction estimated at 65%. No EKG changes concerning for ischemia. No symptoms of chest pain with exertion. Overall this is a low risk scan.  (This was done for evaluation of chest pain with troponin ranging from 0.06-0.11.  According to her discharge summary, patient was evaluated by Cardiologist Dr. Julien Nordmann who felt patient did not have ACS.  Ultimately pain was felt likely musculoskeletal.  Patient has since stopped smoking.)  CXR on 05/10/12 showed mild cardiomegaly.  Pre-operative labs noted.  Anticipate she can proceed as planned.  Shonna Chock, PA-C 05/11/12 1010

## 2012-05-12 MED ORDER — CEFAZOLIN SODIUM-DEXTROSE 2-3 GM-% IV SOLR
2.0000 g | INTRAVENOUS | Status: AC
  Administered 2012-05-13: 2 g via INTRAVENOUS
  Filled 2012-05-12: qty 50

## 2012-05-13 ENCOUNTER — Encounter (HOSPITAL_COMMUNITY): Payer: Self-pay | Admitting: *Deleted

## 2012-05-13 ENCOUNTER — Encounter (HOSPITAL_COMMUNITY): Payer: Self-pay | Admitting: Vascular Surgery

## 2012-05-13 ENCOUNTER — Ambulatory Visit (HOSPITAL_COMMUNITY): Payer: PRIVATE HEALTH INSURANCE | Admitting: Vascular Surgery

## 2012-05-13 ENCOUNTER — Ambulatory Visit (HOSPITAL_COMMUNITY)
Admission: RE | Admit: 2012-05-13 | Discharge: 2012-05-13 | Disposition: A | Discharge status: Home / Self Care | Payer: PRIVATE HEALTH INSURANCE | Source: Ambulatory Visit | Attending: General Surgery | Admitting: General Surgery

## 2012-05-13 ENCOUNTER — Encounter (HOSPITAL_COMMUNITY)
Admission: RE | Disposition: A | Discharge status: Home / Self Care | Payer: Self-pay | Source: Ambulatory Visit | Attending: General Surgery

## 2012-05-13 ENCOUNTER — Ambulatory Visit (HOSPITAL_BASED_OUTPATIENT_CLINIC_OR_DEPARTMENT_OTHER)
Admission: RE | Admit: 2012-05-13 | Payer: PRIVATE HEALTH INSURANCE | Source: Ambulatory Visit | Admitting: General Surgery

## 2012-05-13 ENCOUNTER — Encounter (HOSPITAL_BASED_OUTPATIENT_CLINIC_OR_DEPARTMENT_OTHER): Admission: RE | Payer: Self-pay | Source: Ambulatory Visit

## 2012-05-13 DIAGNOSIS — Z8249 Family history of ischemic heart disease and other diseases of the circulatory system: Secondary | ICD-10-CM | POA: Insufficient documentation

## 2012-05-13 DIAGNOSIS — K219 Gastro-esophageal reflux disease without esophagitis: Secondary | ICD-10-CM | POA: Insufficient documentation

## 2012-05-13 DIAGNOSIS — Z801 Family history of malignant neoplasm of trachea, bronchus and lung: Secondary | ICD-10-CM | POA: Insufficient documentation

## 2012-05-13 DIAGNOSIS — I10 Essential (primary) hypertension: Secondary | ICD-10-CM | POA: Insufficient documentation

## 2012-05-13 DIAGNOSIS — L723 Sebaceous cyst: Secondary | ICD-10-CM | POA: Insufficient documentation

## 2012-05-13 DIAGNOSIS — Z87891 Personal history of nicotine dependence: Secondary | ICD-10-CM | POA: Insufficient documentation

## 2012-05-13 DIAGNOSIS — Z860100 Personal history of colon polyps, unspecified: Secondary | ICD-10-CM | POA: Insufficient documentation

## 2012-05-13 DIAGNOSIS — Z823 Family history of stroke: Secondary | ICD-10-CM | POA: Insufficient documentation

## 2012-05-13 DIAGNOSIS — E785 Hyperlipidemia, unspecified: Secondary | ICD-10-CM | POA: Insufficient documentation

## 2012-05-13 DIAGNOSIS — E0500 Thyrotoxicosis with diffuse goiter without thyrotoxic crisis or storm: Secondary | ICD-10-CM | POA: Insufficient documentation

## 2012-05-13 HISTORY — PX: CYST REMOVAL NECK: SHX6281

## 2012-05-13 HISTORY — DX: Presence of spectacles and contact lenses: Z97.3

## 2012-05-13 HISTORY — DX: Presence of dental prosthetic device (complete) (partial): K08.109

## 2012-05-13 HISTORY — DX: Presence of dental prosthetic device (complete) (partial): Z97.2

## 2012-05-13 HISTORY — PX: EAR CYST EXCISION: SHX22

## 2012-05-13 SURGERY — CYST REMOVAL
Anesthesia: General | Site: Neck

## 2012-05-13 SURGERY — CYST REMOVAL
Anesthesia: General | Site: Neck | Laterality: Left | Wound class: Clean

## 2012-05-13 MED ORDER — OXYCODONE-ACETAMINOPHEN 5-325 MG PO TABS: 1.0000 | ORAL_TABLET | ORAL | Status: DC | PRN

## 2012-05-13 MED ORDER — DEXAMETHASONE SODIUM PHOSPHATE 4 MG/ML IJ SOLN
INTRAMUSCULAR | Status: DC | PRN
  Administered 2012-05-13: 4 mg via INTRAVENOUS

## 2012-05-13 MED ORDER — FENTANYL CITRATE 0.05 MG/ML IJ SOLN
INTRAMUSCULAR | Status: DC | PRN
  Administered 2012-05-13: 100 ug via INTRAVENOUS

## 2012-05-13 MED ORDER — BUPIVACAINE HCL (PF) 0.25 % IJ SOLN
INTRAMUSCULAR | Status: AC
  Filled 2012-05-13: qty 30

## 2012-05-13 MED ORDER — 0.9 % SODIUM CHLORIDE (POUR BTL) OPTIME
TOPICAL | Status: DC | PRN
  Administered 2012-05-13: 1000 mL

## 2012-05-13 MED ORDER — BUPIVACAINE HCL (PF) 0.25 % IJ SOLN
INTRAMUSCULAR | Status: DC | PRN
  Administered 2012-05-13: 1 mL

## 2012-05-13 MED ORDER — LIDOCAINE HCL (CARDIAC) 20 MG/ML IV SOLN
INTRAVENOUS | Status: DC | PRN
  Administered 2012-05-13: 60 mg via INTRAVENOUS

## 2012-05-13 MED ORDER — SUCCINYLCHOLINE CHLORIDE 20 MG/ML IJ SOLN
INTRAMUSCULAR | Status: DC | PRN
  Administered 2012-05-13: 80 mg via INTRAVENOUS

## 2012-05-13 MED ORDER — CHLORHEXIDINE GLUCONATE 4 % EX LIQD: 1.0000 "application " | Freq: Once | CUTANEOUS | Status: DC

## 2012-05-13 MED ORDER — METOCLOPRAMIDE HCL 5 MG/ML IJ SOLN: 10.0000 mg | Freq: Once | INTRAMUSCULAR | Status: DC | PRN

## 2012-05-13 MED ORDER — MIDAZOLAM HCL 5 MG/5ML IJ SOLN
INTRAMUSCULAR | Status: DC | PRN
  Administered 2012-05-13: 2 mg via INTRAVENOUS

## 2012-05-13 MED ORDER — LIDOCAINE HCL 4 % MT SOLN
OROMUCOSAL | Status: DC | PRN
  Administered 2012-05-13: 4 mL via TOPICAL

## 2012-05-13 MED ORDER — FENTANYL CITRATE 0.05 MG/ML IJ SOLN: 25.0000 ug | INTRAMUSCULAR | Status: DC | PRN

## 2012-05-13 MED ORDER — LACTATED RINGERS IV SOLN
INTRAVENOUS | Status: DC
  Administered 2012-05-13: 12:00:00 via INTRAVENOUS

## 2012-05-13 MED ORDER — PROPOFOL 10 MG/ML IV BOLUS
INTRAVENOUS | Status: DC | PRN
  Administered 2012-05-13: 200 mg via INTRAVENOUS

## 2012-05-13 MED ORDER — ONDANSETRON HCL 4 MG/2ML IJ SOLN
INTRAMUSCULAR | Status: DC | PRN
  Administered 2012-05-13: 4 mg via INTRAVENOUS

## 2012-05-13 MED ORDER — LIDOCAINE-EPINEPHRINE (PF) 1 %-1:200000 IJ SOLN
INTRAMUSCULAR | Status: AC
  Filled 2012-05-13: qty 10

## 2012-05-13 SURGICAL SUPPLY — 36 items
ADH SKN CLS APL DERMABOND .7 (GAUZE/BANDAGES/DRESSINGS) ×1
BLADE SURG 15 STRL LF DISP TIS (BLADE) ×1 IMPLANT
BLADE SURG 15 STRL SS (BLADE) ×2
CHLORAPREP W/TINT 26ML (MISCELLANEOUS) ×2 IMPLANT
CLOTH BEACON ORANGE TIMEOUT ST (SAFETY) ×2 IMPLANT
COVER SURGICAL LIGHT HANDLE (MISCELLANEOUS) ×2 IMPLANT
DECANTER SPIKE VIAL GLASS SM (MISCELLANEOUS) ×4 IMPLANT
DERMABOND ADVANCED (GAUZE/BANDAGES/DRESSINGS) ×1
DERMABOND ADVANCED .7 DNX12 (GAUZE/BANDAGES/DRESSINGS) ×1 IMPLANT
DRAPE PED LAPAROTOMY (DRAPES) ×2 IMPLANT
DRAPE UTILITY 15X26 W/TAPE STR (DRAPE) ×4 IMPLANT
ELECT CAUTERY BLADE 6.4 (BLADE) ×2 IMPLANT
ELECT REM PT RETURN 9FT ADLT (ELECTROSURGICAL) ×2
ELECTRODE REM PT RTRN 9FT ADLT (ELECTROSURGICAL) ×1 IMPLANT
GAUZE SPONGE 4X4 16PLY XRAY LF (GAUZE/BANDAGES/DRESSINGS) ×1 IMPLANT
GLOVE BIO SURGEON STRL SZ7.5 (GLOVE) ×3 IMPLANT
GLOVE BIOGEL PI IND STRL 8 (GLOVE) ×1 IMPLANT
GLOVE BIOGEL PI INDICATOR 8 (GLOVE) ×1
GLOVE SURG SS PI 6.0 STRL IVOR (GLOVE) ×1 IMPLANT
GOWN STRL NON-REIN LRG LVL3 (GOWN DISPOSABLE) ×4 IMPLANT
GOWN STRL REIN XL XLG (GOWN DISPOSABLE) ×1 IMPLANT
KIT BASIN OR (CUSTOM PROCEDURE TRAY) ×2 IMPLANT
KIT ROOM TURNOVER OR (KITS) ×2 IMPLANT
NDL HYPO 25GX1X1/2 BEV (NEEDLE) ×1 IMPLANT
NEEDLE HYPO 25GX1X1/2 BEV (NEEDLE) ×2 IMPLANT
NS IRRIG 1000ML POUR BTL (IV SOLUTION) ×2 IMPLANT
PACK SURGICAL SETUP 50X90 (CUSTOM PROCEDURE TRAY) ×2 IMPLANT
PAD ARMBOARD 7.5X6 YLW CONV (MISCELLANEOUS) ×2 IMPLANT
PENCIL BUTTON HOLSTER BLD 10FT (ELECTRODE) ×2 IMPLANT
SUT MNCRL AB 4-0 PS2 18 (SUTURE) ×2 IMPLANT
SUT VIC AB 3-0 SH 27 (SUTURE) ×2
SUT VIC AB 3-0 SH 27XBRD (SUTURE) ×1 IMPLANT
SYR BULB 3OZ (MISCELLANEOUS) ×2 IMPLANT
SYR CONTROL 10ML LL (SYRINGE) ×2 IMPLANT
TOWEL OR 17X24 6PK STRL BLUE (TOWEL DISPOSABLE) ×2 IMPLANT
TOWEL OR 17X26 10 PK STRL BLUE (TOWEL DISPOSABLE) ×2 IMPLANT

## 2012-05-13 NOTE — Anesthesia Procedure Notes (Signed)
Procedure Name: Intubation Date/Time: 05/13/2012 1:01 PM Performed by: Elon Alas Pre-anesthesia Checklist: Patient identified, Timeout performed, Emergency Drugs available, Suction available and Patient being monitored Patient Re-evaluated:Patient Re-evaluated prior to inductionOxygen Delivery Method: Circle system utilized Preoxygenation: Pre-oxygenation with 100% oxygen Intubation Type: IV induction Ventilation: Mask ventilation without difficulty Laryngoscope Size: Mac and 3 Tube type: Oral Tube size: 7.0 mm Number of attempts: 1 Airway Equipment and Method: Stylet and LTA kit utilized Placement Confirmation: positive ETCO2,  ETT inserted through vocal cords under direct vision and breath sounds checked- equal and bilateral Secured at: 22 cm Tube secured with: Tape Dental Injury: Teeth and Oropharynx as per pre-operative assessment

## 2012-05-13 NOTE — Preoperative (Signed)
Beta Blockers   Reason not to administer Beta Blockers:Not Applicable 

## 2012-05-13 NOTE — Op Note (Signed)
Pre Operative Diagnosis:  Subcutaneous neck cyst  Post Operative Diagnosis: same  Procedure: excision of neck cyst  Surgeon: Dr. Axel Filler  Assistant: none  Anesthesia: GETA  EBL: <5cc  Complications: none  Counts: reported as correct x 2  Findings:  The patient 1 cm subcutaneous cystic mass.  Indications for procedure:  Patient is a 51 year old female with a left-sided neck subcutaneous pass. Patient states this had become we then decided to have this electively removed.  Details of the procedure:The patient was taken back to the operating room. The patient was placed in supine position with bilateral SCDs in place. After appropriate anitbiotics were confirmed, a time-out was confirmed and all facts were verified  a 1/2 cm is made over the cystic. Bovie cautery was used to maintain hemostasis and dissection was carried down the mass. This was bluntly dissected circumferentially and sent off to pathology.  There was irrigated out with sterile saline. 3-0 Vicryl was used to reapproximate the the deep dermal layer, 4-0 monocryl was used to reapproximate the skin, and it was then dressed Dermabond.  The patient was taken to the recovery room in stable condition.

## 2012-05-13 NOTE — H&P (Signed)
  Chief Complaint   Patient presents with   .  Mass     Neck   HPI  Donna [name] is a 51 y.o. female. The patient is referred by Dr. Gailen Shelter for a left neck cyst. Patient has since been there for several years. She states that it becomes erythematous but has never drained. She states is no pain involved. She does have a history of having cyst removed on her scalp.  HPI  Past Medical History   Diagnosis  Date   .  Hypertension    .  Colon polyp    .  Graves disease    .  GERD (gastroesophageal reflux disease)    .  Hyperlipidemia    .  Palpitations    .  Constipation    .  Neck mass    .  Trouble swallowing    .  Chest pain     Past Surgical History   Procedure  Date   .  Tonsillectomy    .  Thyroid surgery  10/2011 - approximate     ablation    Family History   Problem  Relation  Age of Onset   .  Heart attack  Mother    .  CVA  Father    .  Cancer  Brother       lung cancer - stage 4    .  Cancer  Brother       skin   Social History  History   Substance Use Topics   .  Smoking status:  Former Smoker     Quit date:  06/19/2011   .  Smokeless tobacco:  Not on file   .  Alcohol Use:  No   No Known Allergies  Current Outpatient Prescriptions   Medication  Sig  Dispense  Refill   .  amLODipine (NORVASC) 5 MG tablet  Take 5 mg by mouth daily.     .  hydrochlorothiazide (MICROZIDE) 12.5 MG capsule  Take 12.5 mg by mouth daily.     Marland Kitchen  levothyroxine (SYNTHROID, LEVOTHROID) 100 MCG tablet  Take 100 mcg by mouth daily.     .  simvastatin (ZOCOR) 20 MG tablet  Take 20 mg by mouth every evening.     Review of Systems  Review of Systems  All other systems reviewed and are negative.  Blood pressure 126/78, pulse 76, temperature 97.9 F (36.6 C), temperature source Temporal, resp. rate 16, height 5' 7.5" (1.715 m), weight 219 lb 2 oz (99.394 kg).  Physical Exam  Physical Exam  Constitutional: She appears well-developed and well-nourished.  HENT:  Head: Normocephalic and  atraumatic.  Eyes: Conjunctivae normal and EOM are normal. Pupils are equal, round, and reactive to light.  Neck: Normal range of motion. Neck supple.   Cardiovascular: Normal rate, regular rhythm and normal heart sounds.  Pulmonary/Chest: Effort normal and breath sounds normal.  Abdominal: Soft. Bowel sounds are normal.  Musculoskeletal: Normal range of motion.  Data Reviewed  none  Assessment  51year-old female with a left subcutaneous cyst approximately 1 x 2 cm  Plan  1. We'll proceed to the operating room for excision of the left neck subcutaneous cyst.  2. All risks and benefits were discussed with the patient, to generally include infection, bleeding, damage to surrounding structures, and recurrence. Alternatives were offered and described. All questions were answered and the patient voiced understanding of the procedure and wishes to proceed at this point.

## 2012-05-13 NOTE — Anesthesia Preprocedure Evaluation (Addendum)
Anesthesia Evaluation  Patient identified by MRN, date of birth, ID band Patient awake    Reviewed: Allergy & Precautions, H&P , NPO status , Patient's Chart, lab work & pertinent test results, reviewed documented beta blocker date and time   Airway Mallampati: II TM Distance: >3 FB Neck ROM: full    Dental  (+) Dental Advisory Given, Edentulous Upper and Edentulous Lower   Pulmonary neg pulmonary ROS,  breath sounds clear to auscultation        Cardiovascular hypertension, Pt. on medications Rhythm:regular     Neuro/Psych PSYCHIATRIC DISORDERS Anxiety Depression negative neurological ROS  negative psych ROS   GI/Hepatic Neg liver ROS, GERD-  Medicated,  Endo/Other  Hypothyroidism   Renal/GU negative Renal ROS  negative genitourinary   Musculoskeletal   Abdominal   Peds  Hematology negative hematology ROS (+)   Anesthesia Other Findings See surgeon's H&P   Reproductive/Obstetrics negative OB ROS                         Anesthesia Physical Anesthesia Plan  ASA: III  Anesthesia Plan: General   Post-op Pain Management:    Induction: Intravenous  Airway Management Planned: Oral ETT  Additional Equipment:   Intra-op Plan:   Post-operative Plan: Extubation in OR  Informed Consent: I have reviewed the patients History and Physical, chart, labs and discussed the procedure including the risks, benefits and alternatives for the proposed anesthesia with the patient or authorized representative who has indicated his/her understanding and acceptance.   Dental advisory given  Plan Discussed with: Surgeon and Anesthesiologist  Anesthesia Plan Comments:        Anesthesia Quick Evaluation

## 2012-05-13 NOTE — Anesthesia Postprocedure Evaluation (Signed)
Anesthesia Post Note  Patient: Donna [name]  Procedure(s) Performed: Procedure(s) (LRB): CYST REMOVAL (Left)  Anesthesia type: general  Patient location: PACU  Post pain: Pain level controlled  Post assessment: Patient's Cardiovascular Status Stable  Last Vitals:  Filed Vitals:   05/13/12 1358  BP:   Pulse: 58  Temp:   Resp: 23    Post vital signs: Reviewed and stable  Level of consciousness: sedated  Complications: No apparent anesthesia complications

## 2012-05-13 NOTE — Transfer of Care (Signed)
Immediate Anesthesia Transfer of Care Note  Patient: Donna [name]  Procedure(s) Performed: Procedure(s) (LRB) with comments: CYST REMOVAL (Left) - excision of neck cyst  Patient Location: PACU  Anesthesia Type:General  Level of Consciousness: awake, alert  and oriented  Airway & Oxygen Therapy: Patient Spontanous Breathing and Patient connected to nasal cannula oxygen  Post-op Assessment: Report given to PACU RN, Post -op Vital signs reviewed and stable and Patient moving all extremities X 4  Post vital signs: Reviewed and stable  Complications: No apparent anesthesia complications

## 2012-05-17 ENCOUNTER — Encounter (HOSPITAL_COMMUNITY): Payer: Self-pay | Admitting: General Surgery

## 2012-06-02 ENCOUNTER — Encounter (INDEPENDENT_AMBULATORY_CARE_PROVIDER_SITE_OTHER): Payer: Self-pay | Admitting: General Surgery

## 2012-06-02 ENCOUNTER — Encounter (INDEPENDENT_AMBULATORY_CARE_PROVIDER_SITE_OTHER): Payer: No Typology Code available for payment source | Admitting: General Surgery

## 2012-06-02 ENCOUNTER — Ambulatory Visit (INDEPENDENT_AMBULATORY_CARE_PROVIDER_SITE_OTHER): Payer: No Typology Code available for payment source | Admitting: General Surgery

## 2012-06-02 VITALS — BP 132/80 | HR 73 | Temp 98.0°F | Ht 67.5 in | Wt 223.4 lb

## 2012-06-02 DIAGNOSIS — Z9889 Other specified postprocedural states: Secondary | ICD-10-CM

## 2012-06-02 NOTE — Progress Notes (Signed)
Patient ID: Donna [name], female   DOB: [birth year], 52 y.o.   MRN: [id number] This 52 year old female status post neck mass removal. Patient had epidermoid inclusion cyst per pathology this was discussed with the patient.  The patient has been doing well postoperatively without any complaints.  On exam:  Wounds clean dry and intact without erythema or drainage.  Assessment and plan: Patient followup when necessary

## 2012-08-06 ENCOUNTER — Other Ambulatory Visit: Payer: Self-pay | Admitting: Physician Assistant

## 2012-08-06 DIAGNOSIS — I10 Essential (primary) hypertension: Secondary | ICD-10-CM

## 2012-08-06 NOTE — Telephone Encounter (Signed)
Medication refilled per protocol. 

## 2012-10-09 ENCOUNTER — Other Ambulatory Visit: Payer: Self-pay | Admitting: Family Medicine

## 2012-11-08 ENCOUNTER — Other Ambulatory Visit: Payer: Self-pay | Admitting: Family Medicine

## 2012-11-09 NOTE — Telephone Encounter (Signed)
Medication refilled per protocol.Patient needs to be seen before any further refills 

## 2012-11-23 ENCOUNTER — Telehealth: Payer: Self-pay | Admitting: Family Medicine

## 2012-11-23 DIAGNOSIS — I10 Essential (primary) hypertension: Secondary | ICD-10-CM

## 2012-11-23 DIAGNOSIS — E538 Deficiency of other specified B group vitamins: Secondary | ICD-10-CM

## 2012-11-23 DIAGNOSIS — Z79899 Other long term (current) drug therapy: Secondary | ICD-10-CM

## 2012-11-23 DIAGNOSIS — E785 Hyperlipidemia, unspecified: Secondary | ICD-10-CM

## 2012-11-23 DIAGNOSIS — E0500 Thyrotoxicosis with diffuse goiter without thyrotoxic crisis or storm: Secondary | ICD-10-CM

## 2012-11-24 NOTE — Telephone Encounter (Signed)
Labs ordered for 7/18 OV

## 2012-11-30 ENCOUNTER — Other Ambulatory Visit: Payer: PRIVATE HEALTH INSURANCE

## 2012-11-30 DIAGNOSIS — Z79899 Other long term (current) drug therapy: Secondary | ICD-10-CM

## 2012-11-30 DIAGNOSIS — E538 Deficiency of other specified B group vitamins: Secondary | ICD-10-CM

## 2012-11-30 DIAGNOSIS — E785 Hyperlipidemia, unspecified: Secondary | ICD-10-CM

## 2012-11-30 DIAGNOSIS — E0500 Thyrotoxicosis with diffuse goiter without thyrotoxic crisis or storm: Secondary | ICD-10-CM

## 2012-11-30 DIAGNOSIS — I10 Essential (primary) hypertension: Secondary | ICD-10-CM

## 2012-11-30 LAB — VITAMIN B12: Vitamin B-12: 859 pg/mL (ref 211–911)

## 2012-11-30 LAB — CBC WITH DIFFERENTIAL/PLATELET
Basophils Absolute: 0 10*3/uL (ref 0.0–0.1)
Basophils Relative: 0 % (ref 0–1)
Eosinophils Absolute: 0.3 10*3/uL (ref 0.0–0.7)
Eosinophils Relative: 4 % (ref 0–5)
HCT: 37.4 % (ref 36.0–46.0)
Hemoglobin: 12.7 g/dL (ref 12.0–15.0)
Lymphocytes Relative: 32 % (ref 12–46)
Lymphs Abs: 2.1 10*3/uL (ref 0.7–4.0)
MCH: 27.3 pg (ref 26.0–34.0)
MCHC: 34 g/dL (ref 30.0–36.0)
MCV: 80.4 fL (ref 78.0–100.0)
Monocytes Absolute: 0.3 10*3/uL (ref 0.1–1.0)
Monocytes Relative: 5 % (ref 3–12)
Neutro Abs: 3.7 10*3/uL (ref 1.7–7.7)
Neutrophils Relative %: 59 % (ref 43–77)
Platelets: 359 10*3/uL (ref 150–400)
RBC: 4.65 MIL/uL (ref 3.87–5.11)
RDW: 13.6 % (ref 11.5–15.5)
WBC: 6.4 10*3/uL (ref 4.0–10.5)

## 2012-11-30 LAB — TSH: TSH: 0.024 u[IU]/mL — ABNORMAL LOW (ref 0.350–4.500)

## 2012-11-30 LAB — COMPLETE METABOLIC PANEL WITH GFR
ALT: 11 U/L (ref 0–35)
AST: 13 U/L (ref 0–37)
Albumin: 4.1 g/dL (ref 3.5–5.2)
Alkaline Phosphatase: 84 U/L (ref 39–117)
BUN: 15 mg/dL (ref 6–23)
CO2: 29 mEq/L (ref 19–32)
Calcium: 9.8 mg/dL (ref 8.4–10.5)
Chloride: 107 mEq/L (ref 96–112)
Creat: 0.68 mg/dL (ref 0.50–1.10)
GFR, Est African American: >89 mL/min
GFR, Est Non African American: >89 mL/min
Glucose, Bld: 95 mg/dL (ref 70–99)
Potassium: 4.5 mEq/L (ref 3.5–5.3)
Sodium: 144 mEq/L (ref 135–145)
Total Bilirubin: 0.4 mg/dL (ref 0.3–1.2)
Total Protein: 6.6 g/dL (ref 6.0–8.3)

## 2012-11-30 LAB — LIPID PANEL
Cholesterol: 163 mg/dL (ref 0–200)
HDL: 35 mg/dL — ABNORMAL LOW (ref >39)
LDL Cholesterol: 105 mg/dL — ABNORMAL HIGH (ref 0–99)
Total CHOL/HDL Ratio: 4.7 Ratio
Triglycerides: 116 mg/dL (ref <150)
VLDL: 23 mg/dL (ref 0–40)

## 2012-12-03 ENCOUNTER — Encounter: Payer: Self-pay | Admitting: Family Medicine

## 2012-12-03 ENCOUNTER — Other Ambulatory Visit: Payer: Self-pay | Admitting: Family Medicine

## 2012-12-03 ENCOUNTER — Ambulatory Visit (INDEPENDENT_AMBULATORY_CARE_PROVIDER_SITE_OTHER): Payer: PRIVATE HEALTH INSURANCE | Admitting: Family Medicine

## 2012-12-03 VITALS — BP 120/70 | HR 60 | Temp 97.6°F | Resp 18 | Wt 215.0 lb

## 2012-12-03 DIAGNOSIS — I10 Essential (primary) hypertension: Secondary | ICD-10-CM

## 2012-12-03 DIAGNOSIS — E039 Hypothyroidism, unspecified: Secondary | ICD-10-CM | POA: Insufficient documentation

## 2012-12-03 DIAGNOSIS — E785 Hyperlipidemia, unspecified: Secondary | ICD-10-CM

## 2012-12-03 DIAGNOSIS — E538 Deficiency of other specified B group vitamins: Secondary | ICD-10-CM

## 2012-12-03 DIAGNOSIS — Z79899 Other long term (current) drug therapy: Secondary | ICD-10-CM

## 2012-12-03 HISTORY — DX: Hyperlipidemia, unspecified: E78.5

## 2012-12-03 MED ORDER — LEVOTHYROXINE SODIUM 175 MCG PO TABS: 175.0000 ug | ORAL_TABLET | Freq: Every day | ORAL | Status: DC

## 2012-12-03 MED ORDER — PHENTERMINE HCL 37.5 MG PO CAPS: 37.5000 mg | ORAL_CAPSULE | ORAL | Status: DC

## 2012-12-03 MED ORDER — LEVOTHYROXINE SODIUM 75 MCG PO TABS: 75.0000 ug | ORAL_TABLET | Freq: Every day | ORAL | Status: DC

## 2012-12-03 NOTE — Telephone Encounter (Signed)
Pharmacy ask that we order levothyroxine 175 mcg instead of 100 mcg + 75 mcg.  New order placed for one med.

## 2012-12-03 NOTE — Progress Notes (Signed)
Subjective:    Patient ID: Donna [name], female    DOB: 10/01/1960, 53 y.o.   MRN: [id number]  HPI Patient is here for a followup of hypertension. She's currently taking Norvasc 10 mg by mouth daily and hydrochlorothiazide 12.5 mg by mouth daily.  She denies chest pain, shortness of breath, dyspnea on exertion. Her blood pressure is well controlled at 120/70.  She also has hyperlipidemia.  She is currently on Zocor 20 mg by mouth daily. She denies myalgia or right upper quadrant pain.  She also has hypothyroidism. She is currently on levothyroxine 200 mg by mouth daily. She continues to report  trouble with weight gain.  However she has lost 5 pounds since her office visit in January.  Unfortunately her thyroid is supertherapeutic.    She is requesting medication to help with weight loss.  She is dieting. She denies fast food consumption. She is trying to exercise more frequently. She is becoming depressed over her inability to lose weight.  Appointment on 11/30/2012  Component Date Value Range Status  . Sodium 11/30/2012 144  135 - 145 mEq/L Final  . Potassium 11/30/2012 4.5  3.5 - 5.3 mEq/L Final  . Chloride 11/30/2012 107  96 - 112 mEq/L Final  . CO2 11/30/2012 29  19 - 32 mEq/L Final  . Glucose, Bld 11/30/2012 95  70 - 99 mg/dL Final  . BUN 40/98/1191 15  6 - 23 mg/dL Final  . Creat 47/82/9562 0.68  0.50 - 1.10 mg/dL Final  . Total Bilirubin 11/30/2012 0.4  0.3 - 1.2 mg/dL Final  . Alkaline Phosphatase 11/30/2012 84  39 - 117 U/L Final  . AST 11/30/2012 13  0 - 37 U/L Final  . ALT 11/30/2012 11  0 - 35 U/L Final  . Total Protein 11/30/2012 6.6  6.0 - 8.3 g/dL Final  . Albumin 13/12/6576 4.1  3.5 - 5.2 g/dL Final  . Calcium 46/96/2952 9.8  8.4 - 10.5 mg/dL Final  . GFR, Est African American 11/30/2012 >89   Final  . GFR, Est Non African American 11/30/2012 >89   Final   Comment:                            The estimated GFR is a calculation valid for adults (>=15 years old)                     that uses the CKD-EPI algorithm to adjust for age and sex. It is                            not to be used for children, pregnant women, hospitalized patients,                             patients on dialysis, or with rapidly changing kidney function.                          According to the NKDEP, eGFR >89 is normal, 60-89 shows mild                          impairment, 30-59 shows moderate impairment, 15-29 shows severe  impairment and <15 is ESRD.                             . TSH 11/30/2012 0.024* 0.350 - 4.500 uIU/mL Final  . Cholesterol 11/30/2012 163  0 - 200 mg/dL Final   Comment: ATP III Classification:                                < 200        mg/dL        Desirable                               200 - 239     mg/dL        Borderline High                               >= 240        mg/dL        High                             . Triglycerides 11/30/2012 116  <150 mg/dL Final  . HDL 16/02/9603 35* >39 mg/dL Final  . Total CHOL/HDL Ratio 11/30/2012 4.7   Final  . VLDL 11/30/2012 23  0 - 40 mg/dL Final  . LDL Cholesterol 11/30/2012 105* 0 - 99 mg/dL Final   Comment:                            Total Cholesterol/HDL Ratio:CHD Risk                                                 Coronary Heart Disease Risk Table                                                                 Men       Women                                   1/2 Average Risk              3.4        3.3                                       Average Risk              5.0        4.4                                    2X Average Risk  9.6        7.1                                    3X Average Risk             23.4       11.0                          Use the calculated Patient Ratio above and the CHD Risk table                           to determine the patient's CHD Risk.                          ATP III Classification (LDL):                                < 100         mg/dL         Optimal                               100 - 129     mg/dL         Near or Above Optimal                               130 - 159     mg/dL         Borderline High                               160 - 189     mg/dL         High                                > 190        mg/dL         Very High                             . Vitamin B-12 11/30/2012 859  211 - 911 pg/mL Final  . WBC 11/30/2012 6.4  4.0 - 10.5 K/uL Final  . RBC 11/30/2012 4.65  3.87 - 5.11 MIL/uL Final  . Hemoglobin 11/30/2012 12.7  12.0 - 15.0 g/dL Final  . HCT 11/91/4782 37.4  36.0 - 46.0 % Final  . MCV 11/30/2012 80.4  78.0 - 100.0 fL Final  . MCH 11/30/2012 27.3  26.0 - 34.0 pg Final  . MCHC 11/30/2012 34.0  30.0 - 36.0 g/dL Final  . RDW 95/62/1308 13.6  11.5 - 15.5 % Final  . Platelets 11/30/2012 359  150 - 400 K/uL Final  . Neutrophils Relative % 11/30/2012 59  43 - 77 % Final  . Neutro Abs 11/30/2012 3.7  1.7 - 7.7 K/uL Final  . Lymphocytes Relative 11/30/2012 32  12 - 46 % Final  . Lymphs Abs 11/30/2012 2.1  0.7 - 4.0 K/uL Final  .  Monocytes Relative 11/30/2012 5  3 - 12 % Final  . Monocytes Absolute 11/30/2012 0.3  0.1 - 1.0 K/uL Final  . Eosinophils Relative 11/30/2012 4  0 - 5 % Final  . Eosinophils Absolute 11/30/2012 0.3  0.0 - 0.7 K/uL Final  . Basophils Relative 11/30/2012 0  0 - 1 % Final  . Basophils Absolute 11/30/2012 0.0  0.0 - 0.1 K/uL Final  . Smear Review 11/30/2012 Criteria for review not met   Final   Past Medical History  Diagnosis Date  . Hypertension   . Colon polyp   . GERD (gastroesophageal reflux disease)   . Hyperlipidemia   . Palpitations   . Constipation   . Neck mass   . Trouble swallowing   . Chest pain   . Full dentures   . Wears glasses   . Hypothyroidism   . Anxiety   . IBS (irritable bowel syndrome)   . Hemorrhoid   . Numbness and tingling in hands   . Numbness in both legs   . Neuropathy     feet  . Arthritis   . Schatzki's ring   . HLD  (hyperlipidemia) 12/03/2012   Current outpatient prescriptions:acetaminophen (TYLENOL) 325 MG tablet, Take 650 mg by mouth every 6 (six) hours as needed. For pain, Disp: , Rfl: ;  amLODipine (NORVASC) 10 MG tablet, , Disp: , Rfl: ;  diphenhydrAMINE (BENADRYL) 25 MG tablet, Take 25 mg by mouth at bedtime as needed. For sleep, Disp: , Rfl: ;  famotidine (PEPCID) 20 MG tablet, Take 20 mg by mouth 2 (two) times daily as needed. For acid reflux, Disp: , Rfl:  hydrochlorothiazide (MICROZIDE) 12.5 MG capsule, TAKE ONE CAPSULE BY MOUTH EVERY DAY, Disp: 30 capsule, Rfl: 6;  ibuprofen (ADVIL,MOTRIN) 200 MG tablet, Take 400 mg by mouth every 6 (six) hours as needed. For pain, Disp: , Rfl: ;  levothyroxine (LEVOTHROID) 100 MCG tablet, Take 100 mcg by mouth. 2 tab po qd, Disp: , Rfl: ;  simvastatin (ZOCOR) 20 MG tablet, TAKE ONE TABLET BY MOUTH AT BEDTIME, Disp: 30 tablet, Rfl: 0 vitamin B-12 (CYANOCOBALAMIN) 1000 MCG tablet, Take 1,000 mcg by mouth daily., Disp: , Rfl: ;  levothyroxine (SYNTHROID) 75 MCG tablet, Take 1 tablet (75 mcg total) by mouth daily before breakfast., Disp: 30 tablet, Rfl: 5;  phentermine 37.5 MG capsule, Take 1 capsule (37.5 mg total) by mouth every morning., Disp: 30 capsule, Rfl: 2  No Known Allergies History   Social History  . Marital Status: Single    Spouse Name: N/A    Number of Children: N/A  . Years of Education: N/A   Occupational History  . Not on file.   Social History Main Topics  . Smoking status: Former Smoker -- 32 years    Quit date: 06/19/2011  . Smokeless tobacco: Not on file  . Alcohol Use: No  . Drug Use: No  . Sexually Active: Not on file   Other Topics Concern  . Not on file   Social History Narrative  . No narrative on file   Family History  Problem Relation Age of Onset  . Heart attack Mother   . CVA Father   . Cancer Brother     lung cancer - stage 4  . Cancer Brother     skin       Review of Systems  All other systems reviewed and are  negative.       Objective:   Physical Exam  Vitals  reviewed. Constitutional: She is oriented to person, place, and time. She appears well-developed and well-nourished.  HENT:  Head: Normocephalic and atraumatic.  Right Ear: External ear normal.  Left Ear: External ear normal.  Eyes: Conjunctivae and EOM are normal. Pupils are equal, round, and reactive to light.  Neck: Neck supple. No JVD present. No thyromegaly present.  Cardiovascular: Normal rate, regular rhythm, normal heart sounds and intact distal pulses.  Exam reveals no gallop and no friction rub.   No murmur heard. Pulmonary/Chest: Effort normal and breath sounds normal. No respiratory distress. She has no wheezes. She has no rales. She exhibits no tenderness.  Abdominal: Soft. Bowel sounds are normal. She exhibits no distension and no mass. There is no tenderness. There is no rebound and no guarding.  Musculoskeletal: Normal range of motion. She exhibits no edema.  Lymphadenopathy:    She has no cervical adenopathy.  Neurological: She is alert and oriented to person, place, and time. She has normal reflexes. She displays normal reflexes. No cranial nerve deficit. She exhibits normal muscle tone. Coordination normal.  Skin: Skin is warm. No rash noted. No erythema. No pallor.  Psychiatric: She has a normal mood and affect. Her behavior is normal. Judgment and thought content normal.          Assessment & Plan:  1. Encounter for long-term (current) use of other medications B12 level was normal. I recommended she continue over-the-counter vitamin B12 given her history of peripheral neuropathy and B12 deficiency. I also prescribed patient adipex 37.5 mg by mouth every morning with 2 refills to use for weight loss. Continue to encourage therapeutic lifestyle  2. HTN (hypertension) Blood pressure is well controlled. Continue current medication at the present dosages.  3. HLD (hyperlipidemia) LDL cholesterol is acceptable. I  did recommend increasing aerobic exercise to try to raise her low HDL.  4. B12 deficiency See problem #1  5. Unspecified hypothyroidism Her TSH is supertherapeutic.  Decrease levothyroxine to 175 mcg by mouth daily and recheck TSH in 2 months.

## 2012-12-12 ENCOUNTER — Other Ambulatory Visit: Payer: Self-pay | Admitting: Family Medicine

## 2013-01-01 ENCOUNTER — Ambulatory Visit: Payer: PRIVATE HEALTH INSURANCE | Admitting: Emergency Medicine

## 2013-01-01 VITALS — BP 130/80 | HR 67 | Temp 97.6°F | Resp 16 | Ht 68.5 in | Wt 206.6 lb

## 2013-01-01 DIAGNOSIS — L519 Erythema multiforme, unspecified: Secondary | ICD-10-CM

## 2013-01-01 DIAGNOSIS — L538 Other specified erythematous conditions: Secondary | ICD-10-CM

## 2013-01-01 NOTE — Patient Instructions (Signed)
Erythema Multiforme Erythema multiforme (EM) is a rash that occurs mostly on the skin. Sometimes it occurs on the lips and mouth. It is usually a mild illness that goes away on its own. It usually affects young adults in the spring and fall. It tends to be recurrent with each episode lasting 1 to 4 weeks. CAUSES  The cause of EM may be an overreaction by the body's immune system to a trigger (something that causes the body to react).  Common triggers include:  Infections, including:  Viruses.  Bacteria.  Fungi.  Parasites.  Medicines. Less common triggers include:  Foods.  Chemicals.  Injuries to the skin.  Pregnancy.  Other illnesses. In some cases the cause may not be known. SYMPTOMS  The rash from EM shows up suddenly. The rash may appear days after the trigger. It may start as small, red, round or oval marks that become bumps or raised welts over 24 to 48 hours. These can spread and be quite large (about one inch [several centimeters]). These skin changes usually appear first on the backs of the hands, then spread to the tops of the feet, arms, elbows, knees, palms and soles. There may be a mild rash on the lips and lining of the mouth. The skin rash may show up in waves over a few days. There may be mild itching or burning of the skin at first. It may take up to 4 weeks to go away. The rash may come back again at a later time. DIAGNOSIS  Diagnosis of EM is usually made by physical exam. Sometimes a skin biopsy is done if the diagnosis is not certain. A skin biopsy is the removal of a small piece of tissue which can be examined under a microscope by a specialist (pathologist). TREATMENT  Most episodes of EM heal on their own and treatment may not be needed. If possible, it is best to remove the trigger or treat the infection. If your trigger is a herpes virus infection (cold sore), use sunscreen lotion and sunscreen-containing lip balm to prevent sunlight triggered outbreaks of  herpes virus. Medicine for itching may be given. Medicines can be used for severe cases and to prevent repeat bouts of EM.  HOME CARE INSTRUCTIONS   If possible, avoid known triggers.  If a medicine was your trigger, be sure to notify all of your caregivers. You should avoid this medicine or any like it in the future. SEEK MEDICAL CARE IF:   Your EM rash shows up again in the future SEEK IMMEDIATE MEDICAL CARE IF:   Red, swollen lips or mouth develop.  Burning feeling in the mouth or lips.  Blisters or open sores in the mouth, lips, vagina, penis or anus.  Eye pain, redness or drainage.  Blisters on the skin.  Difficulty breathing.  Difficulty swallowing; drooling.  Blood in urine.  Pain with urinating. Document Released: 05/05/2005 Document Revised: 07/28/2011 Document Reviewed: 04/21/2008 [employer] Patient Information 2014 [employer].  

## 2013-01-01 NOTE — Progress Notes (Signed)
[facility] [address] [place] [address] [phone]  Date:  01/01/2013   Name:  Donna [name]   DOB:  [birth year]   MRN:  [id number]  PCP:  Odette Fraction, MD    Chief Complaint: Rash   History of Present Illness:  Donna [name] is a 52 y.o. very pleasant female patient who presents with the following:  Has an eruption predominantly on her palms and hands since Wednesday.  Pruritic.  No recent antecedent infection or medication change.  No arthritis, no fever or chills, nausea or vomiting.  No stool change, cough or coryza.  No ill contact.  No improvement with over the counter medications or other home remedies. Denies other complaint or health concern today.   Patient Active Problem List   Diagnosis Date Noted  . Unspecified hypothyroidism 12/03/2012  . HLD (hyperlipidemia) 12/03/2012  . OVERWEIGHT 06/22/2007  . NICOTINE ADDICTION 06/22/2007  . DEPRESSION 06/22/2007  . HYPERTENSION 06/22/2007  . GERD 06/22/2007  . OSTEOARTHRITIS 06/22/2007    Past Medical History  Diagnosis Date  . Hypertension   . Colon polyp   . GERD (gastroesophageal reflux disease)   . Hyperlipidemia   . Palpitations   . Constipation   . Neck mass   . Trouble swallowing   . Chest pain   . Full dentures   . Wears glasses   . Hypothyroidism   . Anxiety   . IBS (irritable bowel syndrome)   . Hemorrhoid   . Numbness and tingling in hands   . Numbness in both legs   . Neuropathy     feet  . Arthritis   . Schatzki's ring   . HLD (hyperlipidemia) 12/03/2012  . Depression     Past Surgical History  Procedure Laterality Date  . Tonsillectomy    . Thyroid surgery  10/2011 - approximate    ablation  . Colonoscopy    . Upper gi endoscopy    . Ear cyst excision  05/13/2012    Procedure: CYST REMOVAL;  Surgeon: Ralene Ok, MD;  Location: [facility];  Service: General;  Laterality: Left;  excision of neck cyst  . Cyst removal neck  05/13/12    History   Substance Use Topics  . Smoking status: Former Smoker -- 105 years    Quit date: 06/19/2011  . Smokeless tobacco: Not on file  . Alcohol Use: No    Family History  Problem Relation Age of Onset  . Heart attack Mother   . Hypertension Mother   . Diabetes Mother   . Heart disease Mother   . CVA Father   . Hypertension Father   . Diabetes Father   . Stroke Father   . Cancer Brother     lung cancer - stage 4  . Cancer Brother     skin   . Heart disease Maternal Grandfather     No Known Allergies  Medication list has been reviewed and updated.  Current Outpatient Prescriptions on File Prior to Visit  Medication Sig Dispense Refill  . acetaminophen (TYLENOL) 325 MG tablet Take 650 mg by mouth every 6 (six) hours as needed. For pain      . amLODipine (NORVASC) 10 MG tablet       . amLODipine (NORVASC) 10 MG tablet Take 1 tablet (10 mg total) by mouth daily.  30 tablet  5  . diphenhydrAMINE (BENADRYL) 25 MG tablet Take 25 mg by mouth at bedtime as needed. For  sleep      . famotidine (PEPCID) 20 MG tablet Take 20 mg by mouth 2 (two) times daily as needed. For acid reflux      . hydrochlorothiazide (MICROZIDE) 12.5 MG capsule TAKE ONE CAPSULE BY MOUTH EVERY DAY  30 capsule  6  . ibuprofen (ADVIL,MOTRIN) 200 MG tablet Take 400 mg by mouth every 6 (six) hours as needed. For pain      . levothyroxine (SYNTHROID, LEVOTHROID) 175 MCG tablet Take 1 tablet (175 mcg total) by mouth daily before breakfast.  30 tablet  1  . phentermine 37.5 MG capsule Take 1 capsule (37.5 mg total) by mouth every morning.  30 capsule  2  . simvastatin (ZOCOR) 20 MG tablet TAKE ONE TABLET BY MOUTH ONCE DAILY AT BEDTIME  30 tablet  5  . vitamin B-12 (CYANOCOBALAMIN) 1000 MCG tablet Take 1,000 mcg by mouth daily.       No current facility-administered medications on file prior to visit.    Review of Systems:  As per HPI, otherwise negative.    Physical Examination: Filed Vitals:   01/01/13 0852  BP:  130/80  Pulse: 67  Temp: 97.6 F (36.4 C)  Resp: 16   Filed Vitals:   01/01/13 0852  Height: 5' 8.5" (1.74 m)  Weight: 206 lb 9.6 oz (93.713 kg)   Body mass index is 30.95 kg/(m^2). Ideal Body Weight: Weight in (lb) to have BMI = 25: 166.5  GEN: WDWN, NAD, Non-toxic, A & O x 3 HEENT: Atraumatic, Normocephalic. Neck supple. No masses, No LAD. Ears and Nose: No external deformity. CV: RRR, No M/G/R. No JVD. No thrill. No extra heart sounds. PULM: CTA B, no wheezes, crackles, rhonchi. No retractions. No resp. distress. No accessory muscle use. ABD: S, NT, ND, +BS. No rebound. No HSM. EXTR: No c/c/e NEURO Normal gait.  PSYCH: Normally interactive. Conversant. Not depressed or anxious appearing.  Calm demeanor.  SKIN:  Erythematous rash with target on hands including palms.    Assessment and Plan: Erythema multiforme Benadryl   Signed,  Ellison Carwin, MD

## 2013-01-25 ENCOUNTER — Ambulatory Visit (INDEPENDENT_AMBULATORY_CARE_PROVIDER_SITE_OTHER): Payer: PRIVATE HEALTH INSURANCE | Admitting: Family Medicine

## 2013-01-25 ENCOUNTER — Encounter: Payer: Self-pay | Admitting: Family Medicine

## 2013-01-25 VITALS — BP 138/74 | HR 78 | Temp 97.9°F | Resp 18

## 2013-01-25 DIAGNOSIS — J029 Acute pharyngitis, unspecified: Secondary | ICD-10-CM

## 2013-01-25 DIAGNOSIS — B86 Scabies: Secondary | ICD-10-CM

## 2013-01-25 MED ORDER — AMOXICILLIN 875 MG PO TABS: 875.0000 mg | ORAL_TABLET | Freq: Two times a day (BID) | ORAL | Status: DC

## 2013-01-25 MED ORDER — PERMETHRIN 5 % EX CREA: TOPICAL_CREAM | Freq: Once | CUTANEOUS | Status: DC

## 2013-01-25 NOTE — Progress Notes (Signed)
Subjective:    Patient ID: Donna [name], female    DOB: Apr 23, 1961, 52 y.o.   MRN: [id number]  HPI Patient reports 2 weeks of sore throat. She reports subjective fever. 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 She denies any cough. She denies any rhinorrhea. She denies any ear pain nausea vomiting or diarrhea.    She also has a pruritic rash on both forearms consistent of numerous 1-2 mm skin colored papules and numerous excoriations. It is only located on the forearms and on the hands. Past Medical History  Diagnosis Date  . Hypertension   . Colon polyp   . GERD (gastroesophageal reflux disease)   . Hyperlipidemia   . Palpitations   . Constipation   . Neck mass   . Trouble swallowing   . Chest pain   . Full dentures   . Wears glasses   . Hypothyroidism   . Anxiety   . IBS (irritable bowel syndrome)   . Hemorrhoid   . Numbness and tingling in hands   . Numbness in both legs   . Neuropathy     feet  . Arthritis   . Schatzki's ring   . HLD (hyperlipidemia) 12/03/2012  . Depression    Current Outpatient Prescriptions on File Prior to Visit  Medication Sig Dispense Refill  . acetaminophen (TYLENOL) 325 MG tablet Take 650 mg by mouth every 6 (six) hours as needed. For pain      . amLODipine (NORVASC) 10 MG tablet Take 1 tablet (10 mg total) by mouth daily.  30 tablet  5  . diphenhydrAMINE (BENADRYL) 25 MG tablet Take 25 mg by mouth at bedtime as needed. For sleep      . famotidine (PEPCID) 20 MG tablet Take 20 mg by mouth 2 (two) times daily as needed. For acid reflux      . hydrochlorothiazide (MICROZIDE) 12.5 MG capsule TAKE ONE CAPSULE BY MOUTH EVERY DAY  30 capsule  6  . ibuprofen (ADVIL,MOTRIN) 200 MG tablet Take 400 mg by mouth every 6 (six) hours as needed. For pain      . levothyroxine (SYNTHROID, LEVOTHROID) 175 MCG tablet Take 1 tablet (175 mcg total) by mouth daily before breakfast.  30 tablet  1  . phentermine 37.5 MG capsule Take 1 capsule (37.5 mg  total) by mouth every morning.  30 capsule  2  . simvastatin (ZOCOR) 20 MG tablet TAKE ONE TABLET BY MOUTH ONCE DAILY AT BEDTIME  30 tablet  5  . vitamin B-12 (CYANOCOBALAMIN) 1000 MCG tablet Take 1,000 mcg by mouth daily.       No current facility-administered medications on file prior to visit.   No Known Allergies History   Social History  . Marital Status: Single    Spouse Name: N/A    Number of Children: N/A  . Years of Education: N/A   Occupational History  . Not on file.   Social History Main Topics  . Smoking status: Former Smoker -- 32 years    Quit date: 06/19/2011  . Smokeless tobacco: Not on file  . Alcohol Use: No  . Drug Use: No  . Sexual Activity: Yes   Other Topics Concern  . Not on file   Social History Narrative  . No narrative on file      Review of Systems  All other systems reviewed and are negative.       Objective:   Physical Exam  Vitals reviewed. Constitutional:  She appears well-developed and well-nourished. No distress.  HENT:  Right Ear: External ear normal.  Left Ear: External ear normal.  Nose: Nose normal.  Mouth/Throat: Posterior oropharyngeal edema and posterior oropharyngeal erythema present. No oropharyngeal exudate.  Eyes: Pupils are equal, round, and reactive to light. Right eye exhibits no discharge. Left eye exhibits no discharge. No scleral icterus.  Neck: Normal range of motion. Neck supple.  Cardiovascular: Normal rate, regular rhythm and normal heart sounds.  Exam reveals no gallop and no friction rub.   No murmur heard. Pulmonary/Chest: Effort normal and breath sounds normal. No respiratory distress. She has no wheezes. She has no rales.  Abdominal: Soft. Bowel sounds are normal.  Lymphadenopathy:    She has no cervical adenopathy.  Skin: Rash (see description and history of present illness) noted. She is not diaphoretic. No erythema.          Assessment & Plan:  1. Acute pharyngitis [place] 2 weeks of sore  throat that is not responding to tincture of time and her known exposure to strep throat, I would treat with amoxicillin 875 mg by mouth twice a day for 10 days. - amoxicillin (AMOXIL) 875 MG tablet; Take 1 tablet (875 mg total) by mouth 2 (two) times daily.  Dispense: 20 tablet; Refill: 0  2. Scabies Begin Elimite cream. Apply head to toe and rinse off after 8 hours.  Recheck in 3-4 days if no better.

## 2013-03-18 ENCOUNTER — Other Ambulatory Visit: Payer: Self-pay | Admitting: Family Medicine

## 2013-03-18 NOTE — Telephone Encounter (Signed)
.?   OK to Refill Phentermine

## 2013-03-18 NOTE — Telephone Encounter (Signed)
ok 

## 2013-04-07 ENCOUNTER — Other Ambulatory Visit: Payer: Self-pay | Admitting: Family Medicine

## 2013-04-21 ENCOUNTER — Other Ambulatory Visit: Payer: Self-pay | Admitting: Family Medicine

## 2013-04-21 NOTE — Telephone Encounter (Signed)
Ok x 1

## 2013-04-21 NOTE — Telephone Encounter (Signed)
Ok to refill 

## 2013-05-16 ENCOUNTER — Ambulatory Visit (INDEPENDENT_AMBULATORY_CARE_PROVIDER_SITE_OTHER): Payer: PRIVATE HEALTH INSURANCE | Admitting: Family Medicine

## 2013-05-16 ENCOUNTER — Encounter: Payer: Self-pay | Admitting: Family Medicine

## 2013-05-16 VITALS — BP 128/74 | HR 64 | Temp 97.0°F | Resp 14 | Wt 215.0 lb

## 2013-05-16 DIAGNOSIS — E785 Hyperlipidemia, unspecified: Secondary | ICD-10-CM

## 2013-05-16 DIAGNOSIS — E039 Hypothyroidism, unspecified: Secondary | ICD-10-CM

## 2013-05-16 DIAGNOSIS — M75100 Unspecified rotator cuff tear or rupture of unspecified shoulder, not specified as traumatic: Secondary | ICD-10-CM

## 2013-05-16 DIAGNOSIS — M7551 Bursitis of right shoulder: Secondary | ICD-10-CM

## 2013-05-16 DIAGNOSIS — K5900 Constipation, unspecified: Secondary | ICD-10-CM

## 2013-05-16 LAB — COMPLETE METABOLIC PANEL WITH GFR
ALT: 15 U/L (ref 0–35)
AST: 16 U/L (ref 0–37)
Albumin: 4.2 g/dL (ref 3.5–5.2)
Alkaline Phosphatase: 81 U/L (ref 39–117)
BUN: 11 mg/dL (ref 6–23)
CO2: 26 mEq/L (ref 19–32)
Calcium: 9.4 mg/dL (ref 8.4–10.5)
Chloride: 105 mEq/L (ref 96–112)
Creat: 0.59 mg/dL (ref 0.50–1.10)
GFR, Est African American: >89 mL/min
GFR, Est Non African American: >89 mL/min
Glucose, Bld: 97 mg/dL (ref 70–99)
Potassium: 4.3 mEq/L (ref 3.5–5.3)
Sodium: 143 mEq/L (ref 135–145)
Total Bilirubin: 0.4 mg/dL (ref 0.3–1.2)
Total Protein: 6.6 g/dL (ref 6.0–8.3)

## 2013-05-16 LAB — TSH: TSH: 0.173 u[IU]/mL — ABNORMAL LOW (ref 0.350–4.500)

## 2013-05-16 LAB — LIPID PANEL
Cholesterol: 148 mg/dL (ref 0–200)
HDL: 46 mg/dL (ref >39)
LDL Cholesterol: 83 mg/dL (ref 0–99)
Total CHOL/HDL Ratio: 3.2 Ratio
Triglycerides: 96 mg/dL (ref <150)
VLDL: 19 mg/dL (ref 0–40)

## 2013-05-16 NOTE — Progress Notes (Signed)
Subjective:    Patient ID: Donna [name], female    DOB: 03-30-1961, 52 y.o.   MRN: [id number]  HPI  Patient presents today for followup of her hypertension, hyperlipidemia, and hypothyroidism. She is due for fasting lipid panel, CMP, and TSH. Her blood pressures well controlled today. She denies any chest pain shortness of breath or dyspnea on exertion. Otherwise she is doing well. She does have 2 other medical concerns.   His ago she developed pain in her right shoulder. It hurts to abduct the arm greater than 90. It aches and throbs constantly. He got better started taking ibuprofen. She denies any injury to the shoulder. She denies any neck pain. She denies any neuropathic pain in the right arm.  She also reports one month of constipation. She is going to bathroom every other day. The stools are hard. She has tried increasing fiber without success. She does not get regular exercise. She is not drinking any regular water. She tried over-the-counter stool softeners with success. Past Medical History  Diagnosis Date  . Hypertension   . Colon polyp   . GERD (gastroesophageal reflux disease)   . Hyperlipidemia   . Palpitations   . Constipation   . Neck mass   . Trouble swallowing   . Chest pain   . Full dentures   . Wears glasses   . Hypothyroidism   . Anxiety   . IBS (irritable bowel syndrome)   . Hemorrhoid   . Numbness and tingling in hands   . Numbness in both legs   . Neuropathy     feet  . Arthritis   . Schatzki's ring   . HLD (hyperlipidemia) 12/03/2012  . Depression    Current Outpatient Prescriptions on File Prior to Visit  Medication Sig Dispense Refill  . acetaminophen (TYLENOL) 325 MG tablet Take 650 mg by mouth every 6 (six) hours as needed. For pain      . amLODipine (NORVASC) 10 MG tablet Take 1 tablet (10 mg total) by mouth daily.  30 tablet  5  . diphenhydrAMINE (BENADRYL) 25 MG tablet Take 25 mg by mouth at bedtime as needed. For sleep      . famotidine  (PEPCID) 20 MG tablet Take 20 mg by mouth 2 (two) times daily as needed. For acid reflux      . hydrochlorothiazide (MICROZIDE) 12.5 MG capsule TAKE ONE CAPSULE BY MOUTH ONCE DAILY  30 capsule  3  . ibuprofen (ADVIL,MOTRIN) 200 MG tablet Take 400 mg by mouth every 6 (six) hours as needed. For pain      . levothyroxine (SYNTHROID, LEVOTHROID) 175 MCG tablet TAKE ONE TABLET BY MOUTH ONCE DAILY BEFORE BREAKFAST  30 tablet  1  . phentermine 37.5 MG capsule TAKE ONE CAPSULE BY MOUTH IN THE MORNING  30 capsule  0  . simvastatin (ZOCOR) 20 MG tablet TAKE ONE TABLET BY MOUTH ONCE DAILY AT BEDTIME  30 tablet  5  . vitamin B-12 (CYANOCOBALAMIN) 1000 MCG tablet Take 1,000 mcg by mouth daily.       No current facility-administered medications on file prior to visit.   No Known Allergies History   Social History  . Marital Status: Single    Spouse Name: N/A    Number of Children: N/A  . Years of Education: N/A   Occupational History  . Not on file.   Social History Main Topics  . Smoking status: Former Smoker -- 32 years    Quit date: 06/19/2011  .  Smokeless tobacco: Not on file  . Alcohol Use: No  . Drug Use: No  . Sexual Activity: Yes   Other Topics Concern  . Not on file   Social History Narrative  . No narrative on file     Review of Systems  All other systems reviewed and are negative.       Objective:   Physical Exam  Vitals reviewed. Cardiovascular: Normal rate, regular rhythm and normal heart sounds.  Exam reveals no gallop and no friction rub.   No murmur heard. Pulmonary/Chest: Effort normal and breath sounds normal. No respiratory distress. She has no wheezes. She has no rales.  Abdominal: Soft. Bowel sounds are normal. She exhibits no distension. There is no tenderness. There is no rebound.  Musculoskeletal: She exhibits tenderness.   patient has decreased range of motion of the right shoulder. Abduction is limited to 110 due to pain. She has a positive empty can  sign in the right shoulder. She has a positive Hawkins maneuver in the right shoulder.        Assessment & Plan:  1. HLD (hyperlipidemia) Check fasting lipid panel. Goal LDL is less than 100. - COMPLETE METABOLIC PANEL WITH GFR - Lipid panel  2. Unspecified hypothyroidism Given her constipation I will check a TSH to make sure thyroid medication is therapeutic. - TSH  3. Subacromial bursitis, right The patient decided to continue ibuprofen 800 mg by mouth twice a day for the next 2 weeks. If symptoms do not improve, she will proceed with a cortisone injection in the right shoulder.  4. Unspecified constipation I recommended increasing water to 6-8 glasses a day, increasing fiber, and increasing aerobic exercise. I will also check a TSH. If constipation does not improve after conservative measures we will begin further wu.

## 2013-05-17 ENCOUNTER — Encounter: Payer: Self-pay | Admitting: *Deleted

## 2013-05-17 ENCOUNTER — Telehealth: Payer: Self-pay | Admitting: Family Medicine

## 2013-05-17 NOTE — Telephone Encounter (Signed)
Call back number [phone] Pt is needing to know what she needs to about her hand

## 2013-05-18 NOTE — Telephone Encounter (Signed)
Pt states that her hand had started hurting like her shoulder and was maybe wanting a shot in her wrist? Pt did go get a wrist splint yesterday and it has helped a lot and if it gets worse she will call us back for an appointment.

## 2013-05-25 ENCOUNTER — Other Ambulatory Visit: Payer: Self-pay | Admitting: Family Medicine

## 2013-07-02 ENCOUNTER — Other Ambulatory Visit: Payer: Self-pay | Admitting: Family Medicine

## 2013-07-28 ENCOUNTER — Other Ambulatory Visit: Payer: Self-pay | Admitting: Family Medicine

## 2013-07-28 NOTE — Telephone Encounter (Signed)
Refill appropriate and filled per protocol. 

## 2013-09-02 ENCOUNTER — Encounter: Payer: Self-pay | Admitting: Family Medicine

## 2013-09-02 ENCOUNTER — Ambulatory Visit (INDEPENDENT_AMBULATORY_CARE_PROVIDER_SITE_OTHER): Payer: No Typology Code available for payment source | Admitting: Family Medicine

## 2013-09-02 VITALS — BP 120/78 | HR 64 | Temp 97.1°F | Resp 18 | Ht 67.5 in | Wt 210.0 lb

## 2013-09-02 DIAGNOSIS — F3289 Other specified depressive episodes: Secondary | ICD-10-CM

## 2013-09-02 DIAGNOSIS — F32A Depression, unspecified: Secondary | ICD-10-CM

## 2013-09-02 DIAGNOSIS — F329 Major depressive disorder, single episode, unspecified: Secondary | ICD-10-CM

## 2013-09-02 MED ORDER — VENLAFAXINE HCL ER 75 MG PO CP24: 150.0000 mg | ORAL_CAPSULE | Freq: Every day | ORAL | Status: DC

## 2013-09-02 NOTE — Progress Notes (Signed)
Subjective:    Patient ID: Donna [name], female    DOB: 1961/03/31, 53 y.o.   MRN: [id number]  HPI Patient has a history of severe depression in the past.  Years ago she was admitted to the hospital for 2 weeks after an accidental overdose of sleeping pills. She's been treated with numerous antidepressants in the past including Paxil, amitriptyline, and Zoloft. She saw modest improvement with some of his medications as she cannot remember one particularly worked well.  Last few months she's had increasing symptoms of feelings of depression, increasing anhedonia, insomnia due to perseveration over stress and daily events, poor concentration, decreased energy, decreased appetite, and increased mood swings. He cries very easily for no reason. She denies any suicidal ideation. She denies any homicidal ideation. Past Medical History  Diagnosis Date  . Hypertension   . Colon polyp   . GERD (gastroesophageal reflux disease)   . Hyperlipidemia   . Palpitations   . Constipation   . Neck mass   . Trouble swallowing   . Chest pain   . Full dentures   . Wears glasses   . Hypothyroidism   . Anxiety   . IBS (irritable bowel syndrome)   . Hemorrhoid   . Numbness and tingling in hands   . Numbness in both legs   . Neuropathy     feet  . Arthritis   . Schatzki's ring   . HLD (hyperlipidemia) 12/03/2012  . Depression    Current Outpatient Prescriptions on File Prior to Visit  Medication Sig Dispense Refill  . acetaminophen (TYLENOL) 325 MG tablet Take 650 mg by mouth every 6 (six) hours as needed. For pain      . amLODipine (NORVASC) 10 MG tablet TAKE ONE TABLET BY MOUTH ONCE DAILY  30 tablet  5  . diphenhydrAMINE (BENADRYL) 25 MG tablet Take 25 mg by mouth at bedtime as needed. For sleep      . famotidine (PEPCID) 20 MG tablet Take 20 mg by mouth 2 (two) times daily as needed. For acid reflux      . hydrochlorothiazide (MICROZIDE) 12.5 MG capsule TAKE ONE CAPSULE BY MOUTH ONCE DAILY  30  capsule  3  . ibuprofen (ADVIL,MOTRIN) 200 MG tablet Take 400 mg by mouth every 6 (six) hours as needed. For pain      . levothyroxine (SYNTHROID, LEVOTHROID) 175 MCG tablet TAKE ONE TABLET BY MOUTH ONCE DAILY BEFORE BREAKFAST  30 tablet  5  . phentermine 37.5 MG capsule TAKE ONE CAPSULE BY MOUTH IN THE MORNING  30 capsule  0  . simvastatin (ZOCOR) 20 MG tablet TAKE ONE TABLET BY MOUTH AT BEDTIME  30 tablet  5  . vitamin B-12 (CYANOCOBALAMIN) 1000 MCG tablet Take 1,000 mcg by mouth daily.       No current facility-administered medications on file prior to visit.   No Known Allergies History   Social History  . Marital Status: Single    Spouse Name: N/A    Number of Children: N/A  . Years of Education: N/A   Occupational History  . Not on file.   Social History Main Topics  . Smoking status: Former Smoker -- 37 years    Quit date: 06/19/2011  . Smokeless tobacco: Not on file  . Alcohol Use: No  . Drug Use: No  . Sexual Activity: Yes   Other Topics Concern  . Not on file   Social History Narrative  . No narrative on file  Family History  Problem Relation Age of Onset  . Heart attack Mother   . Hypertension Mother   . Diabetes Mother   . Heart disease Mother   . CVA Father   . Hypertension Father   . Diabetes Father   . Stroke Father   . Cancer Brother     lung cancer - stage 4  . Cancer Brother     skin   . Heart disease Maternal Grandfather       Review of Systems  All other systems reviewed and are negative.      Objective:   Physical Exam  Vitals reviewed. Cardiovascular: Normal rate and regular rhythm.   Pulmonary/Chest: Effort normal and breath sounds normal.  Psychiatric: Her speech is normal and behavior is normal. Judgment and thought content normal. Cognition and memory are normal. She exhibits a depressed mood.          Assessment & Plan:  1. Depression Begin Effexor XR 75 mg by mouth every morning for one week then increase to 150 mg  by mouth every morning thereafter. Recheck in one month or sooner if worse. I also gave the patient contact information for local counselors for therapy. - venlafaxine XR (EFFEXOR-XR) 75 MG 24 hr capsule; Take 2 capsules (150 mg total) by mouth daily with breakfast.  Dispense: 60 capsule; Refill: 3

## 2013-09-08 ENCOUNTER — Other Ambulatory Visit: Payer: Self-pay | Admitting: Family Medicine

## 2013-09-08 ENCOUNTER — Encounter: Payer: Self-pay | Admitting: Family Medicine

## 2013-09-08 DIAGNOSIS — Z1231 Encounter for screening mammogram for malignant neoplasm of breast: Secondary | ICD-10-CM

## 2013-12-07 ENCOUNTER — Other Ambulatory Visit: Payer: Self-pay | Admitting: Family Medicine

## 2013-12-09 ENCOUNTER — Telehealth: Payer: Self-pay | Admitting: Family Medicine

## 2013-12-09 MED ORDER — HYDROCHLOROTHIAZIDE 12.5 MG PO CAPS: 12.5000 mg | ORAL_CAPSULE | Freq: Every day | ORAL | Status: DC

## 2013-12-09 NOTE — Telephone Encounter (Signed)
Medication refilled per protocol. 

## 2014-01-16 ENCOUNTER — Other Ambulatory Visit: Payer: Self-pay | Admitting: Family Medicine

## 2014-01-16 NOTE — Telephone Encounter (Signed)
Refill appropriate and filled per protocol. 

## 2014-04-26 ENCOUNTER — Other Ambulatory Visit: Payer: Self-pay | Admitting: Family Medicine

## 2014-04-27 ENCOUNTER — Encounter: Payer: Self-pay | Admitting: Family Medicine

## 2014-04-27 NOTE — Telephone Encounter (Signed)
Medication refill for one time only.  Patient needs to be seen.  Letter sent for patient to call and schedule 

## 2014-05-03 ENCOUNTER — Other Ambulatory Visit: Payer: Self-pay | Admitting: *Deleted

## 2014-05-03 MED ORDER — HYDROCHLOROTHIAZIDE 12.5 MG PO CAPS: 12.5000 mg | ORAL_CAPSULE | Freq: Every day | ORAL | Status: DC

## 2014-05-03 NOTE — Telephone Encounter (Signed)
Received fax requesting refill on HCTZ.  Medication filled x1 with no refills.   Requires office visit before any further refills can be given.   Letter sent.

## 2014-05-04 ENCOUNTER — Ambulatory Visit (INDEPENDENT_AMBULATORY_CARE_PROVIDER_SITE_OTHER): Payer: No Typology Code available for payment source | Admitting: Family Medicine

## 2014-05-04 ENCOUNTER — Encounter: Payer: Self-pay | Admitting: Family Medicine

## 2014-05-04 VITALS — BP 126/84 | HR 74 | Temp 97.8°F | Resp 18 | Ht 67.5 in | Wt 228.0 lb

## 2014-05-04 DIAGNOSIS — E038 Other specified hypothyroidism: Secondary | ICD-10-CM

## 2014-05-04 DIAGNOSIS — I10 Essential (primary) hypertension: Secondary | ICD-10-CM

## 2014-05-04 DIAGNOSIS — E785 Hyperlipidemia, unspecified: Secondary | ICD-10-CM

## 2014-05-04 LAB — COMPLETE METABOLIC PANEL WITH GFR
ALT: 9 U/L (ref 0–35)
AST: 15 U/L (ref 0–37)
Albumin: 4 g/dL (ref 3.5–5.2)
Alkaline Phosphatase: 103 U/L (ref 39–117)
BUN: 11 mg/dL (ref 6–23)
CO2: 26 mEq/L (ref 19–32)
Calcium: 9.2 mg/dL (ref 8.4–10.5)
Chloride: 101 mEq/L (ref 96–112)
Creat: 0.74 mg/dL (ref 0.50–1.10)
GFR, Est African American: >89 mL/min
GFR, Est Non African American: >89 mL/min
Glucose, Bld: 92 mg/dL (ref 70–99)
Potassium: 3.3 mEq/L — ABNORMAL LOW (ref 3.5–5.3)
Sodium: 140 mEq/L (ref 135–145)
Total Bilirubin: 0.4 mg/dL (ref 0.2–1.2)
Total Protein: 6.3 g/dL (ref 6.0–8.3)

## 2014-05-04 LAB — CBC WITH DIFFERENTIAL/PLATELET
Basophils Absolute: 0 10*3/uL (ref 0.0–0.1)
Basophils Relative: 0 % (ref 0–1)
Eosinophils Absolute: 0.3 10*3/uL (ref 0.0–0.7)
Eosinophils Relative: 4 % (ref 0–5)
HCT: 37.6 % (ref 36.0–46.0)
Hemoglobin: 12.6 g/dL (ref 12.0–15.0)
Lymphocytes Relative: 29 % (ref 12–46)
Lymphs Abs: 2.2 10*3/uL (ref 0.7–4.0)
MCH: 27.6 pg (ref 26.0–34.0)
MCHC: 33.5 g/dL (ref 30.0–36.0)
MCV: 82.3 fL (ref 78.0–100.0)
MPV: 9.1 fL — ABNORMAL LOW (ref 9.4–12.4)
Monocytes Absolute: 0.5 10*3/uL (ref 0.1–1.0)
Monocytes Relative: 6 % (ref 3–12)
Neutro Abs: 4.6 10*3/uL (ref 1.7–7.7)
Neutrophils Relative %: 61 % (ref 43–77)
Platelets: 370 10*3/uL (ref 150–400)
RBC: 4.57 MIL/uL (ref 3.87–5.11)
RDW: 13.1 % (ref 11.5–15.5)
WBC: 7.5 10*3/uL (ref 4.0–10.5)

## 2014-05-04 LAB — LIPID PANEL
Cholesterol: 154 mg/dL (ref 0–200)
HDL: 37 mg/dL — ABNORMAL LOW (ref >39)
LDL Cholesterol: 98 mg/dL (ref 0–99)
Total CHOL/HDL Ratio: 4.2 Ratio
Triglycerides: 97 mg/dL (ref <150)
VLDL: 19 mg/dL (ref 0–40)

## 2014-05-04 LAB — TSH: TSH: 1.077 u[IU]/mL (ref 0.350–4.500)

## 2014-05-04 NOTE — Progress Notes (Signed)
Subjective:    Patient ID: Donna [name], female    DOB: [birth year], 53 y.o.   MRN: [id number]  HPI Patient is here today for recheck of her blood pressure, her thyroid condition, and her hyperlipidemia. She has gained substantial weight since I last saw her. She is not dieting. She is not exercising. She does report that she has some mild dyspnea on exertion although she denies any angina or chest pain. She denies any cough. She declines pulmonary function test and chest x-ray today. She attributes the shortness of breath to obesity and deconditioning. She would like to try to start gradually exercising and see if the shortness of breath improves. If not at that time she would consent to a chest x-ray as well as pulmonary function test. Patient has had a stress test within the last 2 years that was normal. Past Medical History  Diagnosis Date  . Hypertension   . Colon polyp   . GERD (gastroesophageal reflux disease)   . Hyperlipidemia   . Palpitations   . Constipation   . Neck mass   . Trouble swallowing   . Chest pain   . Full dentures   . Wears glasses   . Hypothyroidism   . Anxiety   . IBS (irritable bowel syndrome)   . Hemorrhoid   . Numbness and tingling in hands   . Numbness in both legs   . Neuropathy     feet  . Arthritis   . Schatzki's ring   . HLD (hyperlipidemia) 12/03/2012  . Depression    Past Surgical History  Procedure Laterality Date  . Tonsillectomy    . Thyroid surgery  10/2011 - approximate    ablation  . Colonoscopy    . Upper gi endoscopy    . Ear cyst excision  05/13/2012    Procedure: CYST REMOVAL;  Surgeon: Ralene Ok, MD;  Location: [facility];  Service: General;  Laterality: Left;  excision of neck cyst  . Cyst removal neck  05/13/12   Current Outpatient Prescriptions on File Prior to Visit  Medication Sig Dispense Refill  . amLODipine (NORVASC) 10 MG tablet TAKE ONE TABLET BY MOUTH ONCE DAILY 30 tablet 3  . diphenhydrAMINE (BENADRYL) 25 MG  tablet Take 25 mg by mouth at bedtime as needed. For sleep    . famotidine (PEPCID) 20 MG tablet Take 20 mg by mouth 2 (two) times daily as needed. For acid reflux    . hydrochlorothiazide (MICROZIDE) 12.5 MG capsule Take 1 capsule (12.5 mg total) by mouth daily. 30 capsule 0  . levothyroxine (SYNTHROID, LEVOTHROID) 175 MCG tablet TAKE ONE TABLET BY MOUTH ONCE DAILY BEFORE BREAKFAST 30 tablet 0  . simvastatin (ZOCOR) 20 MG tablet TAKE ONE TABLET BY MOUTH AT BEDTIME 30 tablet 3  . venlafaxine XR (EFFEXOR-XR) 75 MG 24 hr capsule TAKE TWO CAPSULES BY MOUTH ONCE DAILY WITH BREAKFAST 60 capsule 0  . vitamin B-12 (CYANOCOBALAMIN) 1000 MCG tablet Take 1,000 mcg by mouth daily.    . phentermine 37.5 MG capsule TAKE ONE CAPSULE BY MOUTH IN THE MORNING (Patient not taking: Reported on 05/04/2014) 30 capsule 0   No current facility-administered medications on file prior to visit.   No Known Allergies History   Social History  . Marital Status: Single    Spouse Name: N/A    Number of Children: N/A  . Years of Education: N/A   Occupational History  . Not on file.   Social History Main Topics  .  Smoking status: Former Smoker -- 20 years    Quit date: 06/19/2011  . Smokeless tobacco: Not on file  . Alcohol Use: No  . Drug Use: No  . Sexual Activity: Yes   Other Topics Concern  . Not on file   Social History Narrative      Review of Systems  All other systems reviewed and are negative.      Objective:   Physical Exam  Constitutional: She appears well-developed and well-nourished.  Eyes: Conjunctivae are normal.  Neck: Neck supple. No JVD present. No thyromegaly present.  Cardiovascular: Normal rate, regular rhythm, normal heart sounds and intact distal pulses.  Exam reveals no gallop and no friction rub.   No murmur heard. Pulmonary/Chest: Effort normal and breath sounds normal. No respiratory distress. She has no wheezes. She has no rales. She exhibits no tenderness.  Abdominal:  Soft. Bowel sounds are normal. She exhibits no distension and no mass. There is no tenderness. There is no rebound and no guarding.  Musculoskeletal: She exhibits no edema.  Lymphadenopathy:    She has no cervical adenopathy.  Vitals reviewed.         Assessment & Plan:  HLD (hyperlipidemia) - Plan: COMPLETE METABOLIC PANEL WITH GFR, CBC with Differential, Lipid panel  Other specified hypothyroidism - Plan: TSH  Essential hypertension  Patient's blood pressure is excellent. I will check her TSH to ensure that she is on the appropriate dose of levothyroxine. I will also check a fasting lipid panel with a goal LDL cholesterol less than 130. I have recommended increasing diet and exercise attempts to help lose weight and improve her deconditioning. If her dyspnea does not improve using these measures, I will obtain a chest x-ray as well as pulmonary function testing to rule out stage I COPD.

## 2014-05-08 ENCOUNTER — Telehealth: Payer: Self-pay | Admitting: Family Medicine

## 2014-05-08 MED ORDER — HYDROCHLOROTHIAZIDE 12.5 MG PO CAPS: 12.5000 mg | ORAL_CAPSULE | Freq: Every day | ORAL | Status: DC

## 2014-05-08 NOTE — Telephone Encounter (Signed)
Patient called about lab results.  Told all normal.  Continue all medications no changes.  HCTZ refilled

## 2014-05-08 NOTE — Telephone Encounter (Signed)
-----   Message from Susy Frizzle, MD sent at 05/05/2014  7:13 AM EST ----- Labs are good.  Nochange in meds.  Recheck in 6 months, sooner if sob worsens.

## 2014-06-03 ENCOUNTER — Other Ambulatory Visit: Payer: Self-pay | Admitting: Family Medicine

## 2014-08-15 ENCOUNTER — Encounter (HOSPITAL_COMMUNITY): Payer: Self-pay | Admitting: Emergency Medicine

## 2014-08-15 ENCOUNTER — Emergency Department (HOSPITAL_COMMUNITY)
Admission: EM | Admit: 2014-08-15 | Discharge: 2014-08-15 | Disposition: A | Discharge status: Home / Self Care | Payer: PRIVATE HEALTH INSURANCE | Attending: Emergency Medicine | Admitting: Emergency Medicine

## 2014-08-15 DIAGNOSIS — Z87738 Personal history of other specified (corrected) congenital malformations of digestive system: Secondary | ICD-10-CM | POA: Diagnosis not present

## 2014-08-15 DIAGNOSIS — K219 Gastro-esophageal reflux disease without esophagitis: Secondary | ICD-10-CM | POA: Insufficient documentation

## 2014-08-15 DIAGNOSIS — Z79899 Other long term (current) drug therapy: Secondary | ICD-10-CM | POA: Diagnosis not present

## 2014-08-15 DIAGNOSIS — R5383 Other fatigue: Secondary | ICD-10-CM | POA: Insufficient documentation

## 2014-08-15 DIAGNOSIS — R197 Diarrhea, unspecified: Secondary | ICD-10-CM | POA: Diagnosis not present

## 2014-08-15 DIAGNOSIS — R109 Unspecified abdominal pain: Secondary | ICD-10-CM | POA: Insufficient documentation

## 2014-08-15 DIAGNOSIS — I10 Essential (primary) hypertension: Secondary | ICD-10-CM | POA: Insufficient documentation

## 2014-08-15 DIAGNOSIS — F419 Anxiety disorder, unspecified: Secondary | ICD-10-CM | POA: Diagnosis not present

## 2014-08-15 DIAGNOSIS — M791 Myalgia: Secondary | ICD-10-CM | POA: Insufficient documentation

## 2014-08-15 DIAGNOSIS — R05 Cough: Secondary | ICD-10-CM | POA: Diagnosis not present

## 2014-08-15 DIAGNOSIS — E039 Hypothyroidism, unspecified: Secondary | ICD-10-CM | POA: Insufficient documentation

## 2014-08-15 DIAGNOSIS — Z8669 Personal history of other diseases of the nervous system and sense organs: Secondary | ICD-10-CM | POA: Diagnosis not present

## 2014-08-15 DIAGNOSIS — Z86010 Personal history of colonic polyps: Secondary | ICD-10-CM | POA: Diagnosis not present

## 2014-08-15 DIAGNOSIS — Z972 Presence of dental prosthetic device (complete) (partial): Secondary | ICD-10-CM | POA: Diagnosis not present

## 2014-08-15 DIAGNOSIS — E785 Hyperlipidemia, unspecified: Secondary | ICD-10-CM | POA: Insufficient documentation

## 2014-08-15 DIAGNOSIS — Z87891 Personal history of nicotine dependence: Secondary | ICD-10-CM | POA: Insufficient documentation

## 2014-08-15 DIAGNOSIS — R1110 Vomiting, unspecified: Secondary | ICD-10-CM | POA: Diagnosis not present

## 2014-08-15 DIAGNOSIS — R531 Weakness: Secondary | ICD-10-CM | POA: Insufficient documentation

## 2014-08-15 LAB — URINALYSIS, ROUTINE W REFLEX MICROSCOPIC
Bilirubin Urine: NEGATIVE
Glucose, UA: NEGATIVE mg/dL
Hgb urine dipstick: NEGATIVE
Ketones, ur: NEGATIVE mg/dL
Leukocytes, UA: NEGATIVE
Nitrite: NEGATIVE
Protein, ur: NEGATIVE mg/dL
Specific Gravity, Urine: 1.034 — ABNORMAL HIGH (ref 1.005–1.030)
Urobilinogen, UA: 0.2 mg/dL (ref 0.0–1.0)
pH: 5 (ref 5.0–8.0)

## 2014-08-15 LAB — COMPREHENSIVE METABOLIC PANEL
ALT: 13 U/L (ref 0–35)
AST: 20 U/L (ref 0–37)
Albumin: 4.2 g/dL (ref 3.5–5.2)
Alkaline Phosphatase: 103 U/L (ref 39–117)
Anion gap: 11 (ref 5–15)
BUN: 22 mg/dL (ref 6–23)
CO2: 27 mmol/L (ref 19–32)
Calcium: 8.7 mg/dL (ref 8.4–10.5)
Chloride: 102 mmol/L (ref 96–112)
Creatinine, Ser: 0.76 mg/dL (ref 0.50–1.10)
GFR calc Af Amer: >90 mL/min (ref >90)
GFR calc non Af Amer: >90 mL/min (ref >90)
Glucose, Bld: 172 mg/dL — ABNORMAL HIGH (ref 70–99)
Potassium: 3.4 mmol/L — ABNORMAL LOW (ref 3.5–5.1)
Sodium: 140 mmol/L (ref 135–145)
Total Bilirubin: 0.6 mg/dL (ref 0.3–1.2)
Total Protein: 7.6 g/dL (ref 6.0–8.3)

## 2014-08-15 LAB — CBC WITH DIFFERENTIAL/PLATELET
Basophils Absolute: 0 10*3/uL (ref 0.0–0.1)
Basophils Relative: 0 % (ref 0–1)
Eosinophils Absolute: 0 10*3/uL (ref 0.0–0.7)
Eosinophils Relative: 0 % (ref 0–5)
HCT: 41 % (ref 36.0–46.0)
Hemoglobin: 13.7 g/dL (ref 12.0–15.0)
Lymphocytes Relative: 2 % — ABNORMAL LOW (ref 12–46)
Lymphs Abs: 0.3 10*3/uL — ABNORMAL LOW (ref 0.7–4.0)
MCH: 28 pg (ref 26.0–34.0)
MCHC: 33.4 g/dL (ref 30.0–36.0)
MCV: 83.8 fL (ref 78.0–100.0)
Monocytes Absolute: 0.3 10*3/uL (ref 0.1–1.0)
Monocytes Relative: 2 % — ABNORMAL LOW (ref 3–12)
Neutro Abs: 13 10*3/uL — ABNORMAL HIGH (ref 1.7–7.7)
Neutrophils Relative %: 96 % — ABNORMAL HIGH (ref 43–77)
Platelets: 359 10*3/uL (ref 150–400)
RBC: 4.89 MIL/uL (ref 3.87–5.11)
RDW: 13.1 % (ref 11.5–15.5)
WBC: 13.6 10*3/uL — ABNORMAL HIGH (ref 4.0–10.5)

## 2014-08-15 LAB — POC OCCULT BLOOD, ED: Fecal Occult Bld: POSITIVE — AB

## 2014-08-15 LAB — LIPASE, BLOOD: Lipase: 21 U/L (ref 11–59)

## 2014-08-15 LAB — TROPONIN I: Troponin I: 0.03 ng/mL (ref <0.031)

## 2014-08-15 MED ORDER — ONDANSETRON 8 MG PO TBDP
8.0000 mg | ORAL_TABLET | Freq: Once | ORAL | Status: AC
  Administered 2014-08-15: 8 mg via ORAL
  Filled 2014-08-15: qty 1

## 2014-08-15 MED ORDER — ONDANSETRON 8 MG PO TBDP: 8.0000 mg | ORAL_TABLET | Freq: Three times a day (TID) | ORAL | Status: DC | PRN

## 2014-08-15 NOTE — ED Provider Notes (Signed)
Hemoccult + but stool was brown per nursing, no melena/blood reported   Ripley Fraise, MD 08/15/14 1031

## 2014-08-15 NOTE — ED Notes (Signed)
Pt. aware of stool sample needed.  Pt. was offered fluids.

## 2014-08-15 NOTE — ED Notes (Signed)
Guaiac specimen card to lab.  Specimen via digital rectal approach.

## 2014-08-15 NOTE — Discharge Instructions (Signed)

## 2014-08-15 NOTE — ED Notes (Signed)
Patient c/o N/V/D onset 2000 yesterday. Patient reports 10-12 episodes of diarrhea in last 24 hours, denies blood in same; one episode of vomiting in last 24 hours; epigastric pain described as "pressure".

## 2014-08-15 NOTE — ED Provider Notes (Signed)
CSN: [id number]     Arrival date & time 08/15/14  6301 History   First MD Initiated Contact with Patient 08/15/14 0710     Chief Complaint  Patient presents with  . Nausea  . Emesis  . Diarrhea    Patient is a 54 y.o. female presenting with vomiting and diarrhea. The history is provided by the patient.  Emesis Severity:  Moderate Timing:  Intermittent Progression:  Improving Chronicity:  New Relieved by:  Nothing Worsened by:  Nothing tried Associated symptoms: abdominal pain, cough, diarrhea and myalgias   Associated symptoms: no fever   Diarrhea Associated symptoms: abdominal pain, cough, myalgias and vomiting   Pt reports she began having diarrhea yesterday - she has had multiple episodes that are nonbloody She also reports some vomiting - she reports after an episode that "felt like my GERD" she had dark material "come out of my nose"  She denies bloody emesis She CP.  No new SOB. She reports mild cough She is a paramedic +sick contacts No recent travel She reports associated abdominal cramping  Past Medical History  Diagnosis Date  . Hypertension   . Colon polyp   . GERD (gastroesophageal reflux disease)   . Hyperlipidemia   . Palpitations   . Constipation   . Neck mass   . Trouble swallowing   . Chest pain   . Full dentures   . Wears glasses   . Hypothyroidism   . Anxiety   . IBS (irritable bowel syndrome)   . Hemorrhoid   . Numbness and tingling in hands   . Numbness in both legs   . Neuropathy     feet  . Arthritis   . Schatzki's ring   . HLD (hyperlipidemia) 12/03/2012  . Depression    Past Surgical History  Procedure Laterality Date  . Tonsillectomy    . Thyroid surgery  10/2011 - approximate    ablation  . Colonoscopy    . Upper gi endoscopy    . Ear cyst excision  05/13/2012    Procedure: CYST REMOVAL;  Surgeon: Ralene Ok, MD;  Location: [facility];  Service: General;  Laterality: Left;  excision of neck cyst  . Cyst removal neck  05/13/12    Family History  Problem Relation Age of Onset  . Heart attack Mother   . Hypertension Mother   . Diabetes Mother   . Heart disease Mother   . CVA Father   . Hypertension Father   . Diabetes Father   . Stroke Father   . Cancer Brother     lung cancer - stage 4  . Cancer Brother     skin   . Heart disease Maternal Grandfather    History  Substance Use Topics  . Smoking status: Former Smoker -- 67 years    Quit date: 06/19/2011  . Smokeless tobacco: Not on file  . Alcohol Use: No   OB History    No data available     Review of Systems  Constitutional: Positive for fatigue.  Respiratory: Positive for cough.   Cardiovascular: Negative for chest pain.  Gastrointestinal: Positive for vomiting, abdominal pain and diarrhea. Negative for blood in stool.  Musculoskeletal: Positive for myalgias.  Neurological: Positive for weakness.  All other systems reviewed and are negative.     Allergies  Review of patient's allergies indicates no known allergies.  Home Medications   Prior to Admission medications   Medication Sig Start Date End Date Taking? Authorizing Provider  amLODipine (NORVASC) 10 MG tablet TAKE ONE TABLET BY MOUTH ONCE DAILY 06/05/14  Yes Susy Frizzle, MD  diphenhydrAMINE (BENADRYL) 25 MG tablet Take 25 mg by mouth at bedtime as needed. For sleep   Yes Historical Provider, MD  famotidine (PEPCID) 20 MG tablet Take 20 mg by mouth 2 (two) times daily as needed. For acid reflux   Yes Historical Provider, MD  hydrochlorothiazide (MICROZIDE) 12.5 MG capsule Take 1 capsule (12.5 mg total) by mouth daily. 05/08/14  Yes Susy Frizzle, MD  levothyroxine (SYNTHROID, LEVOTHROID) 175 MCG tablet Take 1 tablet (175 mcg total) by mouth daily before breakfast. 06/05/14  Yes Susy Frizzle, MD  simvastatin (ZOCOR) 20 MG tablet TAKE ONE TABLET BY MOUTH AT BEDTIME 06/05/14  Yes Susy Frizzle, MD  venlafaxine XR (EFFEXOR-XR) 75 MG 24 hr capsule TAKE TWO CAPSULES BY MOUTH  ONCE DAILY WITH BREAKFAST Patient taking differently: TAKE ONE CAPSULES BY MOUTH ONCE DAILY WITH BREAKFAST 04/27/14  Yes Susy Frizzle, MD  vitamin B-12 (CYANOCOBALAMIN) 1000 MCG tablet Take 1,000 mcg by mouth daily.   Yes Historical Provider, MD  phentermine 37.5 MG capsule TAKE ONE CAPSULE BY MOUTH IN THE MORNING Patient not taking: Reported on 05/04/2014 04/21/13   Susy Frizzle, MD  venlafaxine XR (EFFEXOR-XR) 75 MG 24 hr capsule TAKE TWO CAPSULES BY MOUTH ONCE DAILY WITH BREAKFAST Patient not taking: Reported on 08/15/2014 06/05/14   Susy Frizzle, MD   BP 154/86 mmHg  Pulse 87  Temp(Src) 97.5 F (36.4 C) (Oral)  Resp 18  Ht 5\' 7"  (1.702 m)  Wt 230 lb (104.327 kg)  BMI 36.01 kg/m2  SpO2 100% Physical Exam CONSTITUTIONAL: Well developed/well nourished HEAD: Normocephalic/atraumatic EYES: EOMI/PERRL, no icterus ENMT: Mucous membranes dry NECK: supple no meningeal signs SPINE/BACK:entire spine nontender CV: S1/S2 noted, no murmurs/rubs/gallops noted LUNGS: Lungs are clear to auscultation bilaterally, no apparent distress ABDOMEN: soft, nontender, no rebound or guarding, bowel sounds noted throughout abdomen GU:no cva tenderness NEURO: Pt is awake/alert/appropriate, moves all extremitiesx4.  No facial droop.   EXTREMITIES: pulses normal/equal, full ROM SKIN: warm, color normal PSYCH: no abnormalities of mood noted, alert and oriented to situation  ED Course  Procedures   8:17 AM Pt requests oral zofran Labs pending Suspect viral GE  10:18 AM Pt improved Taking PO fluids No vomiting/diarrhea here  I doubt acute GI bleed given history/exam/labs She would like to go home I feel this is appropriate We discussed strict return precautions  Labs Review Labs Reviewed  CBC WITH DIFFERENTIAL/PLATELET - Abnormal; Notable for the following:    WBC 13.6 (*)    Neutrophils Relative % 96 (*)    Neutro Abs 13.0 (*)    Lymphocytes Relative 2 (*)    Lymphs Abs 0.3 (*)     Monocytes Relative 2 (*)    All other components within normal limits  COMPREHENSIVE METABOLIC PANEL - Abnormal; Notable for the following:    Potassium 3.4 (*)    Glucose, Bld 172 (*)    All other components within normal limits  URINALYSIS, ROUTINE W REFLEX MICROSCOPIC - Abnormal; Notable for the following:    Specific Gravity, Urine 1.034 (*)    All other components within normal limits  POC OCCULT BLOOD, ED - Abnormal; Notable for the following:    Fecal Occult Bld POSITIVE (*)    All other components within normal limits  LIPASE, BLOOD  TROPONIN I    Medications  ondansetron (ZOFRAN-ODT) disintegrating tablet 8 mg (  8 mg Oral Given 08/15/14 0855)     MDM   Final diagnoses:  Vomiting and diarrhea    Nursing notes including past medical history and social history reviewed and considered in documentation Labs/vital reviewed myself and considered during evaluation     Ripley Fraise, MD 08/15/14 1018

## 2014-09-10 NOTE — Consult Note (Signed)
General Aspect 54 year old female who presents with long smoking hx, hyperlipidemia, strong family hx of CAD and CVA, who presents with  left-sided chest pain. Cardiology was consulted for chest pain.   Symptoms began about 4:30 in the evening while the patient was walking in a store. She developed chest discomfort. ain was worse with movement and palpation. She describes it as sharp pain that is constant, severity 5 to 6/10. She denies any shortness of breath, palpitations, or dizziness associated with her symptoms. The patient denies any other aggravating or alleviating factors. Some other epsiodes of chest pain, breast pain, worse with stress.    Present Illness . SOCIAL HISTORY: Smoker 1 ppd, since she was 54 yo, no significant alcohol abuse, or drug use. She is employed as a Audiological scientist. Two jobs.   FAMILY HISTORY: Positive for hypertension. Mother died at the age of 58 and had a myocardial infarction. Father is deceased and had a myocardial infarction. One brother and one sister in their late 36s have had a stroke.   Physical Exam:   GEN WD, WN, NAD    HEENT red conjunctivae    NECK supple    RESP normal resp effort  clear BS    CARD Regular rate and rhythm  No murmur    ABD denies tenderness  soft    LYMPH negative neck    EXTR negative edema    SKIN normal to palpation    NEURO motor/sensory function intact    PSYCH alert, A+O to time, place, person, good insight   Review of Systems:   Subjective/Chief Complaint stress, chest pain    General: No Complaints    Skin: No Complaints    ENT: No Complaints    Eyes: No Complaints    Neck: No Complaints    Respiratory: No Complaints    Cardiovascular: Chest pain or discomfort    Gastrointestinal: No Complaints    Genitourinary: No Complaints    Vascular: No Complaints    Musculoskeletal: No Complaints    Neurologic: No Complaints    Hematologic: No Complaints    Endocrine: No Complaints     Psychiatric: No Complaints    Review of Systems: All other systems were reviewed and found to be negative    Medications/Allergies Reviewed Medications/Allergies reviewed     HTN:    Denies surgical history.:        Admit Diagnosis:   CHEST PAIN: 18-Jun-2011, Active, CHEST PAIN      Admit Reason:   Unspecified chest pain: (786.50) 17-Jun-2011, Active, ICD9, Unspecified chest pain, Auto-generated by [facility] Based on Admission Order   Intermediate coronary syndrome: (411.1) 17-Jun-2011, Active, ICD9, Intermediate coronary syndrome, Auto-generated by [facility] Based on Admission Order   Acute myocardial infarction, unspecified site, episode of care unspecified: (410.90) 17-Jun-2011, Active, ICD9, Acute myocardial infarction, unspecified site, episode of care unspecified, Auto-generated by [facility] Based on Admission Order  Home Medications:  enalapril-hydrochlorothiazide 5 mg-12.5 mg tablet: 1 tab(s) orally once a day x 30 days , Active    Routine Hem:  29-Jan-13 20:49    WBC (CBC) 9.5   RBC (CBC) 4.67   Hemoglobin (CBC) 14.0   Hematocrit (CBC) 40.8   Platelet Count (CBC) 354   MCV 87   MCH 29.9   MCHC 34.3   RDW 13.4  Routine Chem:  29-Jan-13 20:49    Glucose, Serum 96   BUN 11   Creatinine (comp) 0.74   Sodium, Serum 143   Potassium, Serum 3.1  Chloride, Serum 104   CO2, Serum 29   Calcium (Total), Serum 9.2   Anion Gap 10   Osmolality (calc) 284   eGFR (African American) >60   eGFR (Non-African American) >60  Cardiac:  29-Jan-13 20:49    Troponin I 0.06   CK, Total 65   CPK-MB, Serum 1.3  30-Jan-13 04:47    Troponin I 0.06   CK, Total 49   CPK-MB, Serum 1.2  Routine Chem:  30-Jan-13 04:47    Cholesterol, Serum 183   Triglycerides, Serum 133   HDL (INHOUSE) 28   VLDL Cholesterol Calculated 27   LDL Cholesterol Calculated 128   EKG:   Interpretation EKG shows NSR with T wave ABN in V1 to V3    Rate 72   Radiology Results: XRay:    29-Jan-13 21:22, Chest  Portable Single View   Chest Portable Single View    REASON FOR EXAM:    cp  COMMENTS:       PROCEDURE: DXR - DXR PORTABLE CHEST SINGLE VIEW  - Jun 17 2011  9:22PM     RESULT: The lungs are clear. The cardiovascular structures are   unremarkable.     IMPRESSION:      No acute abnormality.    Thank you for the opportunity to contribute to the care of your patient.         Verified By: Osa Craver, M.D., MD    No Known Allergies:   Vital Signs/Nurse's Notes: **Vital Signs.:   30-Jan-13 04:26   Vital Signs Type Routine   Temperature Temperature (F) 97.8   Celsius 36.5   Pulse Pulse 62   Pulse source per Dinamap   Respirations Respirations 20   Systolic BP Systolic BP 881   Diastolic BP (mmHg) Diastolic BP (mmHg) 78   Mean BP 95   BP Source Dinamap   Pulse Ox % Pulse Ox % 97   Pulse Ox Activity Level  At rest   Oxygen Delivery Room Air/ 21 %     Impression 54 year old female who presents with long smoking hx, hyperlipidemia, strong family hx of CAD and CVA, who presents with  left-sided chest pain.   A/P 1) Chest pain She does have risk factors for CAD, smoking, hyperlipidemia, strong family hx. Stress test ordered to rule out ischemia cardiac enz neg so far  2) HTN: bp sable potassium low. Would hold HCTZ, increase potassium in her diet Would continue enalapril at 10 mg daily  3) Hyperlpidemia: Would continue statin   Electronic Signatures: Ida Rogue (MD)  (Signed 30-Jan-13 09:44)  Authored: General Aspect/Present Illness, History and Physical Exam, Review of System, Past Medical History, Health Issues, Home Medications, Labs, EKG , Radiology, Allergies, Vital Signs/Nurse's Notes, Impression/Plan   Last Updated: 30-Jan-13 09:44 by Ida Rogue (MD)

## 2014-09-10 NOTE — Discharge Summary (Signed)
PATIENT NAME:  Donna [name], Donna [name] MR#:  [id number] DATE OF BIRTH:  [birth year]  DATE OF ADMISSION:  06/17/2011 DATE OF DISCHARGE:  06/18/2011  DIAGNOSES: 1. Atypical noncardiac chest pain, possibly musculoskeletal,  2. Hypertension.  3. Hyperlipidemia.  4. Hypokalemia.  5. Smoking.   DISPOSITION: Patient will be discharged home.   FOLLOW UP: Follow up with primary care physician in 1 to 2 weeks after discharge.   DIET: Low sodium.   ACTIVITY: As tolerated.   DISCHARGE MEDICATIONS:  1. Enalapril 10 mg daily.  2. Simvastatin 20 mg daily.   CONSULTATION: Cardiology consultation with Dr. Rockey Situ.   LABORATORY, DIAGNOSTIC, AND RADIOLOGICAL DATA: Inpatient stress test negative for any ischemia. Chest x-ray showed no acute cardiopulmonary pathology. CBC normal. Potassium 3.1 which is supplemented. Phospholipid profile showed cholesterol level of 128. Cardiac enzymes 0.06 to 0.11.   HOSPITAL COURSE: Patient is 54 year old female with history of hypertension, hyperlipidemia, smoking who presented with left-sided chest pain. Given her risk factors she was admitted to the hospital. Cardiac enzymes were checked and ranged from 0.06 to 0.11. Patient was evaluated by Dr. Rockey Situ who felt that patient did not have any acute coronary syndrome. Her chest pain was felt to be musculoskeletal because she had tenderness to palpation on her chest wall. Patient underwent an inpatient nuclear stress test which showed no evidence of ischemia. Patient had hypokalemia which was supplemented and hypokalemia was felt to be due to use of HCTZ. Dr. Rockey Situ recommended that patient's antihypertensive medication be changed to enalapril 10 mg daily. Patient was also found to have hyperlipidemia with LDL of 128. She reported that she was taking symbicor on and off. On recommendation of cardiologist she has been prescribed simvastatin. Patient smokes 1 to 1-1/2 packs per day and was counseled about smoking cessation. Patient was being  discharged home in a stable condition. She will follow up with her primary care physician in 1 to 2 weeks after discharge.   TIME SPENT: 45 minutes.  ____________________________ Cherre Huger, MD sp:cms D: 06/18/2011 19:27:27 ET T: 06/19/2011 12:07:22 ET JOB#: [id number]  cc: Cherre Huger, MD, <Dictator> Cherre Huger MD ELECTRONICALLY SIGNED 06/19/2011 16:44

## 2014-09-10 NOTE — H&P (Signed)
PATIENT NAME:  Donna [name], Donna [name] MR#:  [id number] DATE OF BIRTH:  [birth year]  DATE OF ADMISSION:  06/17/2011  PRIMARY CARE PHYSICIAN: None.  EMERGENCY ROOM PHYSICIAN: Conni Slipper, MD  CHIEF COMPLAINT: Chest pain.   HISTORY OF PRESENT ILLNESS: The patient is a 54 year old female who presents with the chief complaint of substernal and left-sided chest pain. Symptoms began about 4:30 in the evening while the patient was walking in a store. She developed chest discomfort. She describes it as sharp pain that is constant, severity 5 to 6/10. She denies any shortness of breath, palpitations, or dizziness associated with her symptoms. The patient denies any other aggravating or alleviating factors.   ALLERGIES: No known drug allergies.   PAST MEDICAL HISTORY: Hypertension.   CURRENT MEDICATIONS: Enalapril/HCTZ.  SOCIAL HISTORY: The patient denies tobacco abuse, alcohol abuse, or drug use. She is employed as a Audiological scientist.   FAMILY HISTORY: Positive for hypertension. Mother died at the age of 71 and had a myocardial infarction. Father is deceased and had a myocardial infarction. One brother and one sister in their late 8s have had a stroke.   REVIEW OF SYSTEMS: CONSTITUTIONAL: The patient denies any fevers, chills, or any night sweats. HEENT: The patient denies any hearing loss, dysphagia, visual problems, or sore throat. CARDIOVASCULAR: The patient denies any chest pain, orthopnea, or paroxysmal nocturnal dyspnea. RESPIRATORY: The patient denied any cough, wheezing, or hemoptysis. GI: The patient denies any nausea, vomiting, abdominal pain, hematemesis, hematochezia, or melena. GENITOURINARY: The patient denies any hematuria, dysuria, or frequency. NEURO: The patient denies any headache, focal weakness, or seizures. SKIN: The patient any lesions or rash. ENDOCRINE: The patient denies any polyuria, polyphagia, or polydipsia. MUSCULOSKELETAL: The patient denies any arthritis, joint swelling, or swelling.  HEMATOLOGIC: The patient denies any easy bleeding or bruising.   PHYSICAL EXAMINATION:   VITAL SIGNS: Temperature 97.1, heart rate 66, respirations 26, blood pressure 144/90, and oxygen saturation 96%.   HEENT: Atraumatic, normocephalic. Pupils are equal, round, and reactive to light and accommodation. Sclerae anicteric. Mucous membranes moist.   NECK: Supple. No organomegaly.   CARDIOVASCULAR: S1 and S2 regular rate and rhythm. No gallops. No thrills. No murmurs.  RESPIRATORY: Lungs are clear to auscultation. No rales, no rhonchi, no wheezes, and no bronchial breath sounds.   GI: Abdomen is soft, nontender, and nondistended. Normal bowel sounds. No hepatosplenomegaly.   GU: There is no hematuria or masses noted.   SKIN: No lesions and no rash.   ENDOCRINE: No masses and no thyromegaly. No lymphadenopathy or nodes palpable.   NEUROLOGIC: Cranial nerves II through XII grossly intact. Motor strength is 5/5 in bilateral upper and lower extremities. Sensation is within normal limits. No focal neurological deficits are noted on examination.   MUSCULOSKELETAL: No arthritis, joint effusion, or swelling.   HEMATOLOGIC: No ecchymosis or petechiae noted.   LABS/STUDIES: EKG: Normal sinus rhythm, left atrial enlargement, 86 beats per minute.   WBC count 9500, hemoglobin 14, hematocrit 40.8, platelet count 354, and MCV 87. Glucose 96, BUN 11, creatinine 0.74, sodium 143, potassium 3.1, chloride 104, CO2 29, calcium 9.2, and estimated GFR is greater than 60. Troponin 0.06.   ASSESSMENT AND PLAN:  1. The patient at 54 year old female who presents with the chief complaint of chest pain and elevated troponin. Admit to telemetry. Start chest pain clinical pathway orders. Aspirin, metoprolol, and Lovenox. Check cardiac enzymes, troponin, and echo. Cardiology consultation.  2. Hypokalemia. Replace potassium. Recheck in the morning. ____________________________ Tyrone Schimke,  MD  jsp:slb D: 06/18/2011 00:17:00 ET T: 06/18/2011 07:01:18 ET JOB#: [id number]  cc: Tyrone Schimke, MD, <Dictator> Tyrone Schimke MD ELECTRONICALLY SIGNED 06/18/2011 22:01

## 2014-10-25 ENCOUNTER — Other Ambulatory Visit: Payer: No Typology Code available for payment source | Admitting: Family Medicine

## 2014-10-25 ENCOUNTER — Ambulatory Visit (INDEPENDENT_AMBULATORY_CARE_PROVIDER_SITE_OTHER): Payer: No Typology Code available for payment source | Admitting: Physician Assistant

## 2014-10-25 ENCOUNTER — Encounter: Payer: Self-pay | Admitting: Physician Assistant

## 2014-10-25 ENCOUNTER — Other Ambulatory Visit: Payer: No Typology Code available for payment source | Admitting: Physician Assistant

## 2014-10-25 VITALS — BP 130/86 | HR 63 | Temp 98.3°F | Resp 19 | Wt 233.0 lb

## 2014-10-25 DIAGNOSIS — Z1239 Encounter for other screening for malignant neoplasm of breast: Secondary | ICD-10-CM

## 2014-10-25 DIAGNOSIS — Z124 Encounter for screening for malignant neoplasm of cervix: Secondary | ICD-10-CM | POA: Diagnosis not present

## 2014-10-25 DIAGNOSIS — N950 Postmenopausal bleeding: Secondary | ICD-10-CM | POA: Diagnosis not present

## 2014-10-25 DIAGNOSIS — Z01419 Encounter for gynecological examination (general) (routine) without abnormal findings: Secondary | ICD-10-CM

## 2014-10-25 NOTE — Progress Notes (Signed)
Patient ID: MAELA TAKEDA MRN: [id number], DOB: 03-17-1961, 54 y.o. Date of Encounter: @DATE @  Chief Complaint:  Chief Complaint  Patient presents with  . Pap smear    HPI: 54 y.o. year old white female  presents with c/o postmenopausal bleeding.  She sees Dr. Dennard Schaumann at our office for her routine visits and management of her medical problems.  She states that last week she started noticing some bleeding. Says that she was uncomfortable having her GYN exam with Dr. Dennard Schaumann so she scheduled this visit with me.  States that she has not had a menses in > 5 years. Says that last week she noticed some brown coloration in her underwear for about 4 days. Then on Wednesday (which was 8 days ago) she actually saw some pink tinged blood. Since then she has had small amount of spotting/blood present each day. Says that she also feels cramps like she had with her menses in the past.  States that she has not had a Pap smear in 20 years.  Says that she has not had a mammogram in over 2 years.   Past Medical History  Diagnosis Date  . Hypertension   . Colon polyp   . GERD (gastroesophageal reflux disease)   . Hyperlipidemia   . Palpitations   . Constipation   . Neck mass   . Trouble swallowing   . Chest pain   . Full dentures   . Wears glasses   . Hypothyroidism   . Anxiety   . IBS (irritable bowel syndrome)   . Hemorrhoid   . Numbness and tingling in hands   . Numbness in both legs   . Neuropathy     feet  . Arthritis   . Schatzki's ring   . HLD (hyperlipidemia) 55/18/2014  . Depression      Home Meds: Outpatient Prescriptions Prior to Visit  Medication Sig Dispense Refill  . amLODipine (NORVASC) 10 MG tablet TAKE ONE TABLET BY MOUTH ONCE DAILY 30 tablet 11  . diphenhydrAMINE (BENADRYL) 25 MG tablet Take 25 mg by mouth at bedtime as needed. For sleep    . famotidine (PEPCID) 20 MG tablet Take 20 mg by mouth 2 (two) times daily as needed. For acid reflux    .  hydrochlorothiazide (MICROZIDE) 12.5 MG capsule Take 1 capsule (12.5 mg total) by mouth daily. 30 capsule 5  . levothyroxine (SYNTHROID, LEVOTHROID) 175 MCG tablet Take 1 tablet (175 mcg total) by mouth daily before breakfast. 30 tablet 11  . ondansetron (ZOFRAN ODT) 8 MG disintegrating tablet Take 1 tablet (8 mg total) by mouth every 8 (eight) hours as needed for vomiting. 8 tablet 0  . simvastatin (ZOCOR) 20 MG tablet TAKE ONE TABLET BY MOUTH AT BEDTIME 30 tablet 5  . venlafaxine XR (EFFEXOR-XR) 75 MG 24 hr capsule TAKE TWO CAPSULES BY MOUTH ONCE DAILY WITH BREAKFAST (Patient taking differently: TAKE ONE CAPSULES BY MOUTH ONCE DAILY WITH BREAKFAST) 60 capsule 0  . vitamin B-12 (CYANOCOBALAMIN) 1000 MCG tablet Take 1,000 mcg by mouth daily.     No facility-administered medications prior to visit.    Allergies: No Known Allergies  History   Social History  . Marital Status: Single    Spouse Name: N/A  . Number of Children: N/A  . Years of Education: N/A   Occupational History  . Not on file.   Social History Main Topics  . Smoking status: Former Smoker -- 54 years    Quit date: 06/19/2011  .  Smokeless tobacco: Not on file  . Alcohol Use: No  . Drug Use: No  . Sexual Activity: Yes   Other Topics Concern  . Not on file   Social History Narrative    Family History  Problem Relation Age of Onset  . Heart attack Mother   . Hypertension Mother   . Diabetes Mother   . Heart disease Mother   . CVA Father   . Hypertension Father   . Diabetes Father   . Stroke Father   . Cancer Brother     lung cancer - stage 4  . Cancer Brother     skin   . Heart disease Maternal Grandfather      Review of Systems:  See HPI for pertinent ROS. All other ROS negative.    Physical Exam: Blood pressure 130/86, pulse 63, temperature 98.3 F (36.8 C), temperature source Oral, resp. rate 19, weight 233 lb (105.688 kg)., Body mass index is 36.48 kg/(m^2). General: Obese WF. Appears in no  acute distress. Neck: Supple. No thyromegaly. No lymphadenopathy. Breast Exam:  Inspection is normal. Symmetrical. No skin changes. Palpation is normal. No masses on with palpation. No nipple discharge. Lungs: Clear bilaterally to auscultation without wheezes, rales, or rhonchi. Breathing is unlabored. Heart: RRR with S1 S2. No murmurs, rubs, or gallops. Musculoskeletal:  Strength and tone normal for age. Pelvic Exam: External genitalia normal. Vaginal mucosa normal. Cervix appears normal. Pap smear obtained. Bimanual exam is normal with no adnexal mass and no uterine mass. However body habitus/obesity does limit this exam. Extremities/Skin: Warm and dry.  Neuro: Alert and oriented X 3. Moves all extremities spontaneously. Gait is normal. CNII-XII grossly in tact. Psych:  Responds to questions appropriately with a normal affect.     ASSESSMENT AND PLAN:  54 y.o. year old female with  1. Postmenopausal bleeding - PAP, Thin Prep w/HPV rflx HPV Type 16/18 - Ambulatory referral to Gynecology  2. Encounter for cervical Pap smear with pelvic exam - PAP, Thin Prep w/HPV rflx HPV Type 16/18  3. Breast cancer screening - MM Digital Screening; Future  Discussed that she definitely needs follow-up with gynecologist. She is agreeable with this. States that she will definitely follow up with gynecologist .  Also, discussed importance of follow-up with mammogram and she is agreeable to go if I will schedule.   Marin Olp [place], [place], [facility] 10/25/2014 2:47 PM

## 2014-10-27 ENCOUNTER — Telehealth: Payer: Self-pay | Admitting: Family Medicine

## 2014-10-27 NOTE — Telephone Encounter (Signed)
Patient calling to see if her gynecology referral has been done - need to coordinate with work schedule [phone]

## 2014-10-27 NOTE — Telephone Encounter (Signed)
Referral has been faxed to [facility]..10/27/14/ss

## 2014-10-28 LAB — PAP, THIN PREP W/HPV RFLX HPV TYPE 16/18: HPV DNA High Risk: NOT DETECTED

## 2014-11-01 ENCOUNTER — Encounter: Payer: Self-pay | Admitting: *Deleted

## 2014-11-15 ENCOUNTER — Other Ambulatory Visit: Payer: Self-pay | Admitting: Obstetrics and Gynecology

## 2014-12-07 DIAGNOSIS — C569 Malignant neoplasm of unspecified ovary: Secondary | ICD-10-CM | POA: Insufficient documentation

## 2015-02-11 ENCOUNTER — Other Ambulatory Visit: Payer: Self-pay | Admitting: Family Medicine

## 2015-06-24 ENCOUNTER — Other Ambulatory Visit: Payer: Self-pay | Admitting: Family Medicine

## 2015-06-25 ENCOUNTER — Other Ambulatory Visit: Payer: Self-pay | Admitting: Physician Assistant

## 2015-06-25 DIAGNOSIS — Z1231 Encounter for screening mammogram for malignant neoplasm of breast: Secondary | ICD-10-CM

## 2015-06-25 NOTE — Telephone Encounter (Signed)
Refill appropriate and filled per protocol. 

## 2015-07-24 ENCOUNTER — Other Ambulatory Visit: Payer: Self-pay | Admitting: Family Medicine

## 2015-08-06 ENCOUNTER — Other Ambulatory Visit: Payer: Self-pay | Admitting: Family Medicine

## 2015-10-29 ENCOUNTER — Ambulatory Visit: Payer: Self-pay | Admitting: Physician Assistant

## 2015-10-29 ENCOUNTER — Encounter: Payer: Self-pay | Admitting: Physician Assistant

## 2015-10-29 VITALS — BP 120/80 | HR 71 | Temp 98.1°F

## 2015-10-29 DIAGNOSIS — R21 Rash and other nonspecific skin eruption: Secondary | ICD-10-CM

## 2015-10-29 DIAGNOSIS — M62838 Other muscle spasm: Secondary | ICD-10-CM

## 2015-10-29 MED ORDER — AMOXICILLIN 875 MG PO TABS: 875.0000 mg | ORAL_TABLET | Freq: Two times a day (BID) | ORAL | Status: DC

## 2015-10-29 MED ORDER — BACLOFEN 10 MG PO TABS: 10.0000 mg | ORAL_TABLET | Freq: Three times a day (TID) | ORAL | Status: DC

## 2015-10-29 NOTE — Progress Notes (Signed)
S: c/o 2 bite marks that are itchy on her upper back, been there for a week, did mow the grass before she saw them, ? Tick vs [place], also some back spasms, has knot in her back, hx of ovarian CA  O: vitals wnl, nad, 2 large hive like areas with bite marks in center, no pus or drainage, typical of stari tick bite; also 2 cm by 1cm knot in r side of back, smooth nonmobile, n/v intact  A: tick bite, knot in back with muscle spasms  P: amoxil, baclofen, f/u with oncologist for eval of back pain

## 2016-02-19 ENCOUNTER — Other Ambulatory Visit: Payer: Self-pay | Admitting: Family Medicine

## 2016-03-28 ENCOUNTER — Telehealth: Payer: Self-pay | Admitting: Family Medicine

## 2016-03-28 NOTE — Telephone Encounter (Signed)
NTBS sooner if worsening

## 2016-03-28 NOTE — Telephone Encounter (Signed)
Pt calling stating that she has been having "chest discomfort"  Says has been going on for awhile and Oncologist advised she see her PCP.  Tried to get more specific answer but patient remained vague.  Only stressed that she wanted to see PCP.  Advised next available appt with PCP is Wednesday 04/09/16.  She made appt for that day.  Pt told if having chest discomfort, acute, short of breath or worsening, PCP does advise going to ED immediatly.

## 2016-04-09 ENCOUNTER — Ambulatory Visit: Payer: Self-pay | Admitting: Family Medicine

## 2016-06-24 ENCOUNTER — Ambulatory Visit: Payer: No Typology Code available for payment source | Admitting: Family Medicine

## 2016-06-30 ENCOUNTER — Encounter: Payer: Self-pay | Admitting: Family Medicine

## 2016-06-30 ENCOUNTER — Other Ambulatory Visit: Payer: Self-pay | Admitting: Family Medicine

## 2016-06-30 ENCOUNTER — Ambulatory Visit (INDEPENDENT_AMBULATORY_CARE_PROVIDER_SITE_OTHER): Payer: Managed Care, Other (non HMO) | Admitting: Family Medicine

## 2016-06-30 VITALS — BP 126/80 | HR 78 | Temp 97.9°F | Resp 18 | Ht 67.5 in | Wt 243.0 lb

## 2016-06-30 DIAGNOSIS — I10 Essential (primary) hypertension: Secondary | ICD-10-CM | POA: Diagnosis not present

## 2016-06-30 DIAGNOSIS — C569 Malignant neoplasm of unspecified ovary: Secondary | ICD-10-CM | POA: Diagnosis not present

## 2016-06-30 DIAGNOSIS — E038 Other specified hypothyroidism: Secondary | ICD-10-CM | POA: Diagnosis not present

## 2016-06-30 DIAGNOSIS — E7800 Pure hypercholesterolemia, unspecified: Secondary | ICD-10-CM | POA: Diagnosis not present

## 2016-06-30 NOTE — Progress Notes (Signed)
Subjective:    Patient ID: Donna [name], female    DOB: [birth year], 56 y.o.   MRN: [id number]  HPI I have not seen this patient in quite some time. She has a history of ovarian cancer. She is currently being treated at [facility]. She underwent a hysterectomy. Recently her CA-125 was found to have risen CT scan of abdomen and pelvis revealed fluid in the pelvis and also borderline enlarged lymph nodes. Patient was diagnosed with recurrent ovarian cancer was started in the clinical trial. At the current time she is being treated at [facility] with carboplatin and doxorubicin. She states that the doctors at [facility] and perform an echocardiogram which was "normal". I have not received it this information. She also has a history of hypertension currently on amlodipine and hydrochlorothiazide. However she states that her potassium levels continue to remain low on chemotherapy. Therefore hydrochlorothiazide may not be the best option for this patient. She denies any cramps or muscle pains. She also has a history of hyperlipidemia and is currently taking simvastatin. She denies any myalgias or right upper quadrant pain. I'm aware that she is taking simvastatin and amlodipine. She is primarily taking amlodipine because I felt that in the past she was having vasospasms causing chest pain. Since she started amlodipine, the symptoms have improved. She is also on levothyroxine for hypothyroidism. She is overdue to recheck a TSH. Otherwise she is doing well. Past Medical History:  Diagnosis Date  . Anxiety   . Arthritis   . Cancer ([place])    ovarian, recurrent on chemo (carboplatin, doxorubicin) at [facility]  . Chest pain   . Colon polyp   . Constipation   . Depression   . Full dentures   . GERD (gastroesophageal reflux disease)   . Hemorrhoid   . HLD (hyperlipidemia) 12/03/2012  . Hyperlipidemia   . Hypertension   . Hypothyroidism   . IBS (irritable bowel syndrome)   . Neck mass   . Neuropathy (HCC)    feet  . Numbness  and tingling in hands   . Numbness in both legs   . Palpitations   . Schatzki's ring   . Trouble swallowing   . Wears glasses    Past Surgical History:  Procedure Laterality Date  . COLONOSCOPY    . CYST REMOVAL NECK  05/13/12  . EAR CYST EXCISION  05/13/2012   Procedure: CYST REMOVAL;  Surgeon: Ralene Ok, MD;  Location: [place];  Service: General;  Laterality: Left;  excision of neck cyst  . THYROID SURGERY  10/2011 - approximate   ablation  . TONSILLECTOMY    . UPPER GI ENDOSCOPY     Current Outpatient Prescriptions on File Prior to Visit  Medication Sig Dispense Refill  . amLODipine (NORVASC) 10 MG tablet TAKE ONE TABLET BY MOUTH ONCE DAILY 30 tablet 11  . diphenhydrAMINE (BENADRYL) 25 MG tablet Take 25 mg by mouth at bedtime as needed. For sleep    . famotidine (PEPCID) 20 MG tablet Take 20 mg by mouth 2 (two) times daily as needed. For acid reflux    . hydrochlorothiazide (MICROZIDE) 12.5 MG capsule TAKE ONE CAPSULE BY MOUTH ONCE DAILY 30 capsule 11  . levothyroxine (SYNTHROID, LEVOTHROID) 175 MCG tablet TAKE ONE TABLET BY MOUTH ONCE DAILY BEFORE  BREAKFAST 30 tablet 11  . ondansetron (ZOFRAN ODT) 8 MG disintegrating tablet Take 1 tablet (8 mg total) by mouth every 8 (eight) hours as needed for vomiting. 8 tablet 0  . potassium chloride  SA (K-DUR,KLOR-CON) 20 MEQ tablet Take 20 mEq by mouth daily.     . simvastatin (ZOCOR) 20 MG tablet TAKE ONE TABLET BY MOUTH AT BEDTIME 30 tablet 11  . vitamin B-12 (CYANOCOBALAMIN) 1000 MCG tablet Take 1,000 mcg by mouth daily.    . [DISCONTINUED] phentermine 37.5 MG capsule TAKE ONE CAPSULE BY MOUTH IN THE MORNING (Patient not taking: Reported on 05/04/2014) 30 capsule 0   No current facility-administered medications on file prior to visit.    Allergies  Allergen Reactions  . Paclitaxel Other (See Comments)    Flushing, Itching, Throat tightness   Social History   Social History  . Marital status: Single    Spouse name: N/A  .  Number of children: N/A  . Years of education: N/A   Occupational History  . Not on file.   Social History Main Topics  . Smoking status: Former Smoker    Years: 32.00    Quit date: 06/19/2011  . Smokeless tobacco: Never Used  . Alcohol use No  . Drug use: No  . Sexual activity: Yes   Other Topics Concern  . Not on file   Social History Narrative  . No narrative on file   Current Outpatient Prescriptions on File Prior to Visit  Medication Sig Dispense Refill  . amLODipine (NORVASC) 10 MG tablet TAKE ONE TABLET BY MOUTH ONCE DAILY 30 tablet 11  . diphenhydrAMINE (BENADRYL) 25 MG tablet Take 25 mg by mouth at bedtime as needed. For sleep    . famotidine (PEPCID) 20 MG tablet Take 20 mg by mouth 2 (two) times daily as needed. For acid reflux    . hydrochlorothiazide (MICROZIDE) 12.5 MG capsule TAKE ONE CAPSULE BY MOUTH ONCE DAILY 30 capsule 11  . levothyroxine (SYNTHROID, LEVOTHROID) 175 MCG tablet TAKE ONE TABLET BY MOUTH ONCE DAILY BEFORE  BREAKFAST 30 tablet 11  . ondansetron (ZOFRAN ODT) 8 MG disintegrating tablet Take 1 tablet (8 mg total) by mouth every 8 (eight) hours as needed for vomiting. 8 tablet 0  . potassium chloride SA (K-DUR,KLOR-CON) 20 MEQ tablet Take 20 mEq by mouth daily.     . simvastatin (ZOCOR) 20 MG tablet TAKE ONE TABLET BY MOUTH AT BEDTIME 30 tablet 11  . vitamin B-12 (CYANOCOBALAMIN) 1000 MCG tablet Take 1,000 mcg by mouth daily.    . [DISCONTINUED] phentermine 37.5 MG capsule TAKE ONE CAPSULE BY MOUTH IN THE MORNING (Patient not taking: Reported on 05/04/2014) 30 capsule 0   No current facility-administered medications on file prior to visit.    Allergies  Allergen Reactions  . Paclitaxel Other (See Comments)    Flushing, Itching, Throat tightness   Social History   Social History  . Marital status: Single    Spouse name: N/A  . Number of children: N/A  . Years of education: N/A   Occupational History  . Not on file.   Social History Main  Topics  . Smoking status: Former Smoker    Years: 32.00    Quit date: 06/19/2011  . Smokeless tobacco: Never Used  . Alcohol use No  . Drug use: No  . Sexual activity: Yes   Other Topics Concern  . Not on file   Social History Narrative  . No narrative on file      Review of Systems  All other systems reviewed and are negative.      Objective:   Physical Exam  Constitutional: She appears well-developed and well-nourished.  Neck: Neck supple. No JVD  present. No thyromegaly present.  Cardiovascular: Normal rate, regular rhythm and normal heart sounds.   No murmur heard. Pulmonary/Chest: Effort normal and breath sounds normal. No respiratory distress. She has no wheezes. She has no rales.  Abdominal: Soft. Bowel sounds are normal. She exhibits no distension and no mass. There is no tenderness. There is no rebound and no guarding.  Musculoskeletal: She exhibits no edema.  Lymphadenopathy:    She has no cervical adenopathy.  Vitals reviewed.         Assessment & Plan:  Other specified hypothyroidism - Plan: TSH  Pure hypercholesterolemia - Plan: CBC with Differential/Platelet, COMPLETE METABOLIC PANEL WITH GFR, LDL Cholesterol, Direct  Malignant neoplasm of ovary, unspecified laterality (HCC)  Benign essential HTN  Blood pressure today is well controlled. Goal blood pressure is less than 140/90. Check the patient's potassium today. If her potassium is low, I will discontinue hydrochlorothiazide and replace it with lisinopril. I will also check a TSH to ensure that her levothyroxine is in the therapeutic range. Check a direct LDL. Goal LDL cholesterol is less than 100. Consider switching simvastatin to Lipitor to avoid toxicity with amlodipine. I would like to receive records from [facility] regarding her echocardiogram to evaluate for cardiomyopathy on the doxorubicin.

## 2016-07-01 LAB — CBC WITH DIFFERENTIAL/PLATELET
Basophils Absolute: 0 cells/uL (ref 0–200)
Basophils Relative: 0 %
Eosinophils Absolute: 35 cells/uL (ref 15–500)
Eosinophils Relative: 1 %
HCT: 27.8 % — ABNORMAL LOW (ref 35.0–45.0)
Hemoglobin: 9.5 g/dL — ABNORMAL LOW (ref 12.0–15.0)
Lymphocytes Relative: 44 %
Lymphs Abs: 1540 cells/uL (ref 850–3900)
MCH: 32.2 pg (ref 27.0–33.0)
MCHC: 34.2 g/dL (ref 32.0–36.0)
MCV: 94.2 fL (ref 80.0–100.0)
MPV: 8.9 fL (ref 7.5–12.5)
Monocytes Absolute: 140 cells/uL — ABNORMAL LOW (ref 200–950)
Monocytes Relative: 4 %
Neutro Abs: 1785 cells/uL (ref 1500–7800)
Neutrophils Relative %: 51 %
Platelets: 171 10*3/uL (ref 140–400)
RBC: 2.95 MIL/uL — ABNORMAL LOW (ref 3.80–5.10)
RDW: 16.6 % — ABNORMAL HIGH (ref 11.0–15.0)
WBC: 3.5 10*3/uL — ABNORMAL LOW (ref 3.8–10.8)

## 2016-07-01 LAB — COMPLETE METABOLIC PANEL WITH GFR
ALT: 15 U/L (ref 6–29)
AST: 16 U/L (ref 10–35)
Albumin: 4.1 g/dL (ref 3.6–5.1)
Alkaline Phosphatase: 89 U/L (ref 33–130)
BUN: 9 mg/dL (ref 7–25)
CO2: 28 mmol/L (ref 20–31)
Calcium: 9.4 mg/dL (ref 8.6–10.4)
Chloride: 104 mmol/L (ref 98–110)
Creat: 0.67 mg/dL (ref 0.50–1.05)
GFR, Est African American: >89 mL/min (ref >=60)
GFR, Est Non African American: >89 mL/min (ref >=60)
Glucose, Bld: 88 mg/dL (ref 70–99)
Potassium: 3.8 mmol/L (ref 3.5–5.3)
Sodium: 141 mmol/L (ref 135–146)
Total Bilirubin: 0.4 mg/dL (ref 0.2–1.2)
Total Protein: 6.1 g/dL (ref 6.1–8.1)

## 2016-07-01 LAB — TSH: TSH: 5.2 mIU/L — ABNORMAL HIGH

## 2016-07-01 LAB — LDL CHOLESTEROL, DIRECT: Direct LDL: 113 mg/dL (ref <130)

## 2016-07-03 LAB — IRON AND TIBC
%SAT: 57 % — ABNORMAL HIGH (ref 11–50)
Iron: 169 ug/dL — ABNORMAL HIGH (ref 45–160)
TIBC: 299 ug/dL (ref 250–450)
UIBC: 130 ug/dL (ref 125–400)

## 2016-07-03 LAB — FERRITIN: Ferritin: 246 ng/mL — ABNORMAL HIGH (ref 10–232)

## 2016-07-03 LAB — VITAMIN B12: Vitamin B-12: 474 pg/mL (ref 200–1100)

## 2016-07-15 ENCOUNTER — Telehealth: Payer: Self-pay | Admitting: Family Medicine

## 2016-07-15 MED ORDER — AZITHROMYCIN 250 MG PO TABS: ORAL_TABLET | ORAL | 0 refills | Status: DC

## 2016-07-15 NOTE — Telephone Encounter (Signed)
Medication called/sent to requested pharmacy and pt aware 

## 2016-07-15 NOTE — Telephone Encounter (Signed)
zpack

## 2016-07-15 NOTE — Telephone Encounter (Signed)
Pt is having symptoms of congestion, cough, fever, chills, and doesn't feel good. Wants to know if she can get antibiotic called in. Please call her back.

## 2016-08-12 ENCOUNTER — Other Ambulatory Visit: Payer: Self-pay | Admitting: Family Medicine

## 2016-10-02 ENCOUNTER — Ambulatory Visit: Payer: Self-pay | Admitting: Physician Assistant

## 2016-10-02 ENCOUNTER — Encounter: Payer: Self-pay | Admitting: Physician Assistant

## 2016-10-02 ENCOUNTER — Encounter (INDEPENDENT_AMBULATORY_CARE_PROVIDER_SITE_OTHER): Payer: Self-pay

## 2016-10-02 VITALS — BP 130/70 | HR 70 | Temp 98.5°F | Ht 69.0 in | Wt 240.0 lb

## 2016-10-02 DIAGNOSIS — Z0189 Encounter for other specified special examinations: Principal | ICD-10-CM

## 2016-10-02 DIAGNOSIS — Z0000 Encounter for general adult medical examination without abnormal findings: Secondary | ICD-10-CM

## 2016-10-02 DIAGNOSIS — Z008 Encounter for other general examination: Secondary | ICD-10-CM

## 2016-10-02 NOTE — Progress Notes (Signed)
S: pt here for  biometrics for insurance purposes, had a physical with her pcp on 06/2016;  no complaints ros neg. PMH:    Social:  Fam:  O: vitals wnl, nad, ENT wnl, neck supple no lymph, lungs c t a, cv rrr, abd soft nontender bs normal all 4 quads  A: wellness, biometric physical  P: need lipid profile

## 2016-10-03 LAB — LIPID PANEL
Chol/HDL Ratio: 4.6 ratio — ABNORMAL HIGH (ref 0.0–4.4)
Cholesterol, Total: 174 mg/dL (ref 100–199)
HDL: 38 mg/dL — ABNORMAL LOW (ref >39)
LDL Calculated: 110 mg/dL — ABNORMAL HIGH (ref 0–99)
Triglycerides: 128 mg/dL (ref 0–149)
VLDL Cholesterol Cal: 26 mg/dL (ref 5–40)

## 2017-02-03 DIAGNOSIS — R911 Solitary pulmonary nodule: Secondary | ICD-10-CM | POA: Insufficient documentation

## 2017-02-03 DIAGNOSIS — N899 Noninflammatory disorder of vagina, unspecified: Secondary | ICD-10-CM | POA: Insufficient documentation

## 2017-03-20 ENCOUNTER — Other Ambulatory Visit: Payer: Self-pay | Admitting: Family Medicine

## 2017-09-12 ENCOUNTER — Other Ambulatory Visit: Payer: Self-pay | Admitting: Family Medicine

## 2017-10-15 ENCOUNTER — Other Ambulatory Visit: Payer: Self-pay | Admitting: Family Medicine

## 2017-11-02 ENCOUNTER — Encounter: Payer: No Typology Code available for payment source | Admitting: Family Medicine

## 2017-11-04 ENCOUNTER — Ambulatory Visit (INDEPENDENT_AMBULATORY_CARE_PROVIDER_SITE_OTHER): Payer: Managed Care, Other (non HMO) | Admitting: Family Medicine

## 2017-11-04 ENCOUNTER — Encounter: Payer: Self-pay | Admitting: Family Medicine

## 2017-11-04 DIAGNOSIS — E7800 Pure hypercholesterolemia, unspecified: Secondary | ICD-10-CM

## 2017-11-04 DIAGNOSIS — Z114 Encounter for screening for human immunodeficiency virus [HIV]: Secondary | ICD-10-CM

## 2017-11-04 DIAGNOSIS — Z0000 Encounter for general adult medical examination without abnormal findings: Secondary | ICD-10-CM

## 2017-11-04 DIAGNOSIS — Z23 Encounter for immunization: Secondary | ICD-10-CM | POA: Diagnosis not present

## 2017-11-04 DIAGNOSIS — Z1159 Encounter for screening for other viral diseases: Secondary | ICD-10-CM

## 2017-11-04 DIAGNOSIS — R739 Hyperglycemia, unspecified: Secondary | ICD-10-CM

## 2017-11-04 DIAGNOSIS — I10 Essential (primary) hypertension: Secondary | ICD-10-CM

## 2017-11-04 DIAGNOSIS — E039 Hypothyroidism, unspecified: Secondary | ICD-10-CM

## 2017-11-04 DIAGNOSIS — C569 Malignant neoplasm of unspecified ovary: Secondary | ICD-10-CM

## 2017-11-05 LAB — CBC WITH DIFFERENTIAL/PLATELET
Basophils Absolute: 38 cells/uL (ref 0–200)
Basophils Relative: 0.5 %
Eosinophils Absolute: 203 cells/uL (ref 15–500)
Eosinophils Relative: 2.7 %
HCT: 38.3 % (ref 35.0–45.0)
Hemoglobin: 13.4 g/dL (ref 11.7–15.5)
Lymphs Abs: 1913 cells/uL (ref 850–3900)
MCH: 28.4 pg (ref 27.0–33.0)
MCHC: 35 g/dL (ref 32.0–36.0)
MCV: 81.1 fL (ref 80.0–100.0)
MPV: 9.3 fL (ref 7.5–12.5)
Monocytes Relative: 6.1 %
Neutro Abs: 4890 cells/uL (ref 1500–7800)
Neutrophils Relative %: 65.2 %
Platelets: 303 10*3/uL (ref 140–400)
RBC: 4.72 10*6/uL (ref 3.80–5.10)
RDW: 13 % (ref 11.0–15.0)
Total Lymphocyte: 25.5 %
WBC mixed population: 458 cells/uL (ref 200–950)
WBC: 7.5 10*3/uL (ref 3.8–10.8)

## 2017-11-05 LAB — COMPLETE METABOLIC PANEL WITH GFR
AG Ratio: 1.9 (calc) (ref 1.0–2.5)
ALT: 12 U/L (ref 6–29)
AST: 14 U/L (ref 10–35)
Albumin: 4.2 g/dL (ref 3.6–5.1)
Alkaline phosphatase (APISO): 97 U/L (ref 33–130)
BUN: 12 mg/dL (ref 7–25)
CO2: 26 mmol/L (ref 20–32)
Calcium: 9.5 mg/dL (ref 8.6–10.4)
Chloride: 104 mmol/L (ref 98–110)
Creat: 0.72 mg/dL (ref 0.50–1.05)
GFR, Est African American: 107 mL/min/{1.73_m2} (ref >=60)
GFR, Est Non African American: 92 mL/min/{1.73_m2} (ref >=60)
Globulin: 2.2 g/dL (calc) (ref 1.9–3.7)
Glucose, Bld: 100 mg/dL — ABNORMAL HIGH (ref 65–99)
Potassium: 3.9 mmol/L (ref 3.5–5.3)
Sodium: 142 mmol/L (ref 135–146)
Total Bilirubin: 0.5 mg/dL (ref 0.2–1.2)
Total Protein: 6.4 g/dL (ref 6.1–8.1)

## 2017-11-05 LAB — TSH: TSH: 0.36 mIU/L — ABNORMAL LOW (ref 0.40–4.50)

## 2017-11-05 LAB — LIPID PANEL
Cholesterol: 171 mg/dL (ref <200)
HDL: 37 mg/dL — ABNORMAL LOW (ref >50)
LDL Cholesterol (Calc): 109 mg/dL (calc) — ABNORMAL HIGH
Non-HDL Cholesterol (Calc): 134 mg/dL (calc) — ABNORMAL HIGH (ref <130)
Total CHOL/HDL Ratio: 4.6 (calc) (ref <5.0)
Triglycerides: 134 mg/dL (ref <150)

## 2017-11-05 LAB — HIV ANTIBODY (ROUTINE TESTING W REFLEX): HIV 1&2 Ab, 4th Generation: NONREACTIVE

## 2017-11-05 LAB — HEMOGLOBIN A1C
Hgb A1c MFr Bld: 5.3 % of total Hgb (ref <5.7)
Mean Plasma Glucose: 105 (calc)
eAG (mmol/L): 5.8 (calc)

## 2017-11-05 LAB — HEPATITIS C ANTIBODY
Hepatitis C Ab: NONREACTIVE
SIGNAL TO CUT-OFF: 0.02 (ref <1.00)

## 2017-11-05 NOTE — Progress Notes (Signed)
Subjective:    Patient ID: Donna [name], female    DOB: 1960/06/15, 57 y.o.   MRN: [id number]  HPI  06/2016 I have not seen this patient in quite some time. She has a history of ovarian cancer. She is currently being treated at [facility]. She underwent a hysterectomy. Recently her CA-125 was found to have risen CT scan of abdomen and pelvis revealed fluid in the pelvis and also borderline enlarged lymph nodes. Patient was diagnosed with recurrent ovarian cancer was started in the clinical trial. At the current time she is being treated at [facility] with carboplatin and doxorubicin. She states that the doctors at [facility] and perform an echocardiogram which was "normal". I have not received it this information. She also has a history of hypertension currently on amlodipine and hydrochlorothiazide. However she states that her potassium levels continue to remain low on chemotherapy. Therefore hydrochlorothiazide may not be the best option for this patient. She denies any cramps or muscle pains. She also has a history of hyperlipidemia and is currently taking simvastatin. She denies any myalgias or right upper quadrant pain. I'm aware that she is taking simvastatin and amlodipine. She is primarily taking amlodipine because I felt that in the past she was having vasospasms causing chest pain. Since she started amlodipine, the symptoms have improved. She is also on levothyroxine for hypothyroidism. She is overdue to recheck a TSH. Otherwise she is doing well.  At that time, my plan was: Blood pressure today is well controlled. Goal blood pressure is less than 140/90. Check the patient's potassium today. If her potassium is low, I will discontinue hydrochlorothiazide and replace it with lisinopril. I will also check a TSH to ensure that her levothyroxine is in the therapeutic range. Check a direct LDL. Goal LDL cholesterol is less than 100. Consider switching simvastatin to Lipitor to avoid toxicity with amlodipine. I would  like to receive records from [facility] regarding her echocardiogram to evaluate for cardiomyopathy on the doxorubicin.  11/05/17 I have not seen the patient since February 2018.  She is here today for complete physical exam.  As stated above, she has a history of ovarian cancer.  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  Per the patient's report, it was a midline mass adherent to both ovaries.  She underwent a total abdominal hysterectomy/bilateral salpingo-oophorectomy.  She is being monitored with CT scans every 2 to 3 months.  This is been performed at [facility].  She states that there are questionable lesions around the vaginal cuff.  She states that there are 2 such lesions that they are watching it at the present time they are too small to treat.  She is not currently on chemotherapy.  In the past she underwent surgery as well as chemotherapy with carboplatin and doxorubicin.  At the present time she is in remission.  Regarding her immunizations, the patient is due for Pneumovax 23 given her cancer history.  She reluctantly agrees to receive this.  She is also due for HIV and hepatitis C screening based on her age.  She does not require Pap smear given her surgical history and her current monitoring regimen with frequent CT scans of the pelvis.  It is slightly confusing however she states that her doctors/oncologist are based out of [facility] however because of her insurance, she has to receive her imaging through [facility].  We have not received any of records and have asked her to have her physicians fax [place] her  records at her earliest convenience.  Apparently she was recently told that her blood sugar was slightly elevated as well and she would like [place] to check a hemoglobin A1c.  Her colonoscopy is up-to-date.  Most recent colonoscopy was in 2012 and was normal.  She states that she has had a mammogram recently at [facility] and that this was normal and up-to-date.  Again we have not received records of  this.  She is overdue for fasting lab work to monitor her hypothyroidism, hyperlipidemia. Past Medical History:  Diagnosis Date  . Anxiety   . Arthritis   . Cancer ([place])    ovarian, recurrent on chemo (carboplatin, doxorubicin) at [facility]  . Chest pain   . Colon polyp   . Constipation   . Depression   . Full dentures   . GERD (gastroesophageal reflux disease)   . Hemorrhoid   . HLD (hyperlipidemia) 12/03/2012  . Hyperlipidemia   . Hypertension   . Hypothyroidism   . IBS (irritable bowel syndrome)   . Neck mass   . Neuropathy    feet  . Numbness and tingling in hands   . Numbness in both legs   . Palpitations   . Schatzki's ring   . Trouble swallowing   . Wears glasses    Past Surgical History:  Procedure Laterality Date  . ABDOMINAL HYSTERECTOMY    . COLONOSCOPY    . CYST REMOVAL NECK  05/13/12  . EAR CYST EXCISION  05/13/2012   Procedure: CYST REMOVAL;  Surgeon: Ralene Ok, MD;  Location: [place];  Service: General;  Laterality: Left;  excision of neck cyst  . THYROID SURGERY  10/2011 - approximate   ablation  . TONSILLECTOMY    . UPPER GI ENDOSCOPY     Current Outpatient Medications on File Prior to Visit  Medication Sig Dispense Refill  . amLODipine (NORVASC) 10 MG tablet TAKE 1 TABLET BY MOUTH ONCE DAILY 90 tablet 2  . diphenhydrAMINE (BENADRYL) 25 MG tablet Take 25 mg by mouth at bedtime as needed. For sleep    . famotidine (PEPCID) 20 MG tablet Take by mouth.    . hydrochlorothiazide (MICROZIDE) 12.5 MG capsule TAKE 1 CAPSULE BY MOUTH ONCE DAILY 90 capsule 1  . levothyroxine (SYNTHROID, LEVOTHROID) 175 MCG tablet TAKE 1 TABLET BY MOUTH ONCE DAILY BEFORE  BREAKFAST 90 tablet 2  . ondansetron (ZOFRAN ODT) 8 MG disintegrating tablet Take 1 tablet (8 mg total) by mouth every 8 (eight) hours as needed for vomiting. 8 tablet 0  . potassium chloride SA (K-DUR,KLOR-CON) 20 MEQ tablet Take 20 mEq by mouth daily.     . simvastatin (ZOCOR) 20 MG tablet TAKE 1 TABLET BY  MOUTH AT BEDTIME 90 tablet 1  . vitamin B-12 (CYANOCOBALAMIN) 1000 MCG tablet Take 1,000 mcg by mouth daily.    . [DISCONTINUED] phentermine 37.5 MG capsule TAKE ONE CAPSULE BY MOUTH IN THE MORNING (Patient not taking: Reported on 05/04/2014) 30 capsule 0   No current facility-administered medications on file prior to visit.    Allergies  Allergen Reactions  . Paclitaxel Other (See Comments)    Flushing, Itching, Throat tightness   Social History   Socioeconomic History  . Marital status: Single    Spouse name: Not on file  . Number of children: Not on file  . Years of education: Not on file  . Highest education level: Not on file  Occupational History  . Not on file  Social Needs  . Financial resource strain:  Not on file  . Food insecurity:    Worry: Not on file    Inability: Not on file  . Transportation needs:    Medical: Not on file    Non-medical: Not on file  Tobacco Use  . Smoking status: Former Smoker    Years: 32.00    Last attempt to quit: 06/19/2011    Years since quitting: 6.3  . Smokeless tobacco: Never Used  Substance and Sexual Activity  . Alcohol use: No  . Drug use: No  . Sexual activity: Yes  Lifestyle  . Physical activity:    Days per week: Not on file    Minutes per session: Not on file  . Stress: Not on file  Relationships  . Social connections:    Talks on phone: Not on file    Gets together: Not on file    Attends religious service: Not on file    Active member of club or organization: Not on file    Attends meetings of clubs or organizations: Not on file    Relationship status: Not on file  . Intimate partner violence:    Fear of current or ex partner: Not on file    Emotionally abused: Not on file    Physically abused: Not on file    Forced sexual activity: Not on file  Other Topics Concern  . Not on file  Social History Narrative  . Not on file   Current Outpatient Medications on File Prior to Visit  Medication Sig Dispense  Refill  . amLODipine (NORVASC) 10 MG tablet TAKE 1 TABLET BY MOUTH ONCE DAILY 90 tablet 2  . diphenhydrAMINE (BENADRYL) 25 MG tablet Take 25 mg by mouth at bedtime as needed. For sleep    . famotidine (PEPCID) 20 MG tablet Take by mouth.    . hydrochlorothiazide (MICROZIDE) 12.5 MG capsule TAKE 1 CAPSULE BY MOUTH ONCE DAILY 90 capsule 1  . levothyroxine (SYNTHROID, LEVOTHROID) 175 MCG tablet TAKE 1 TABLET BY MOUTH ONCE DAILY BEFORE  BREAKFAST 90 tablet 2  . ondansetron (ZOFRAN ODT) 8 MG disintegrating tablet Take 1 tablet (8 mg total) by mouth every 8 (eight) hours as needed for vomiting. 8 tablet 0  . potassium chloride SA (K-DUR,KLOR-CON) 20 MEQ tablet Take 20 mEq by mouth daily.     . simvastatin (ZOCOR) 20 MG tablet TAKE 1 TABLET BY MOUTH AT BEDTIME 90 tablet 1  . vitamin B-12 (CYANOCOBALAMIN) 1000 MCG tablet Take 1,000 mcg by mouth daily.    . [DISCONTINUED] phentermine 37.5 MG capsule TAKE ONE CAPSULE BY MOUTH IN THE MORNING (Patient not taking: Reported on 05/04/2014) 30 capsule 0   No current facility-administered medications on file prior to visit.    Allergies  Allergen Reactions  . Paclitaxel Other (See Comments)    Flushing, Itching, Throat tightness   Social History   Socioeconomic History  . Marital status: Single    Spouse name: Not on file  . Number of children: Not on file  . Years of education: Not on file  . Highest education level: Not on file  Occupational History  . Not on file  Social Needs  . Financial resource strain: Not on file  . Food insecurity:    Worry: Not on file    Inability: Not on file  . Transportation needs:    Medical: Not on file    Non-medical: Not on file  Tobacco Use  . Smoking status: Former Smoker    Years: 32.00  Last attempt to quit: 06/19/2011    Years since quitting: 6.3  . Smokeless tobacco: Never Used  Substance and Sexual Activity  . Alcohol use: No  . Drug use: No  . Sexual activity: Yes  Lifestyle  . Physical  activity:    Days per week: Not on file    Minutes per session: Not on file  . Stress: Not on file  Relationships  . Social connections:    Talks on phone: Not on file    Gets together: Not on file    Attends religious service: Not on file    Active member of club or organization: Not on file    Attends meetings of clubs or organizations: Not on file    Relationship status: Not on file  . Intimate partner violence:    Fear of current or ex partner: Not on file    Emotionally abused: Not on file    Physically abused: Not on file    Forced sexual activity: Not on file  Other Topics Concern  . Not on file  Social History Narrative  . Not on file      Review of Systems  All other systems reviewed and are negative.      Objective:   Physical Exam  Constitutional: She is oriented to person, place, and time. She appears well-developed and well-nourished. No distress.  HENT:  Head: Normocephalic and atraumatic.  Right Ear: External ear normal.  Left Ear: External ear normal.  Nose: Nose normal.  Mouth/Throat: Oropharynx is clear and moist. No oropharyngeal exudate.  Eyes: Pupils are equal, round, and reactive to light. Conjunctivae and EOM are normal. Right eye exhibits no discharge. Left eye exhibits no discharge. No scleral icterus.  Neck: Neck supple. No JVD present. No tracheal deviation present. No thyromegaly present.  Cardiovascular: Normal rate, regular rhythm, normal heart sounds and intact distal pulses. Exam reveals no gallop and no friction rub.  No murmur heard. Pulmonary/Chest: Effort normal and breath sounds normal. No stridor. No respiratory distress. She has no wheezes. She has no rales.  Abdominal: Soft. Bowel sounds are normal. She exhibits no distension and no mass. There is no tenderness. There is no rebound and no guarding.  Musculoskeletal: She exhibits no edema.  Lymphadenopathy:    She has no cervical adenopathy.  Neurological: She is alert and  oriented to person, place, and time. She has normal reflexes. She displays normal reflexes. No cranial nerve deficit. She exhibits normal muscle tone. Coordination normal.  Skin: Skin is warm. No rash noted. She is not diaphoretic. No erythema. No pallor.  Psychiatric: She has a normal mood and affect. Her behavior is normal. Judgment and thought content normal.  Vitals reviewed.         Assessment & Plan:  General medical exam  Hypothyroidism, unspecified type - Plan: COMPLETE METABOLIC PANEL WITH GFR, CBC with Differential/Platelet, Lipid panel, TSH, Hemoglobin A1c  Hyperglycemia - Plan: COMPLETE METABOLIC PANEL WITH GFR, CBC with Differential/Platelet, Lipid panel, TSH, Hemoglobin A1c  Encounter for screening for HIV - Plan: HIV antibody  Encounter for hepatitis C screening test for low risk patient - Plan: Hepatitis C Antibody  Primary malignant neoplasm of ovary, unspecified laterality ([place])  Essential hypertension  Pure hypercholesterolemia  Patient received Pneumovax 23 today in clinic.  We discussed the shingles vaccine and she will check on the [place] prior to receiving it.  Regarding her cancer screening, please see the history and present illness section.  Per the patient's  report her mammogram is up-to-date, this was performed recently at [facility].  She does not require Pap smear.  Her colonoscopy is up-to-date.  She is being followed with CAT scans every 2 to 3 months to monitor the 2 lesions on her vaginal cuff by her oncologist at [facility].  She is currently in remission.  I will screen the patient for hepatitis C.  I will also screen the patient for HIV.  Regarding her blood pressures well controlled at 110/80.  I will check a TSH to monitor treatment of her hypothyroidism.  I will check a fasting lipid panel to monitor treatment of her hyperlipidemia.  Ideally given her smoking history, I would like her LDL cholesterol below 100.  Given her recent report of hyperglycemia, I will  also check a hemoglobin A1c.

## 2017-11-13 ENCOUNTER — Other Ambulatory Visit: Payer: Self-pay | Admitting: Family Medicine

## 2017-11-13 DIAGNOSIS — E039 Hypothyroidism, unspecified: Secondary | ICD-10-CM

## 2017-11-13 DIAGNOSIS — Z79899 Other long term (current) drug therapy: Secondary | ICD-10-CM

## 2017-11-13 MED ORDER — LEVOTHYROXINE SODIUM 150 MCG PO TABS: 150.0000 ug | ORAL_TABLET | Freq: Every day | ORAL | 3 refills | Status: DC

## 2017-11-27 ENCOUNTER — Other Ambulatory Visit: Payer: Self-pay | Admitting: Family Medicine

## 2017-11-27 MED ORDER — POTASSIUM CHLORIDE CRYS ER 20 MEQ PO TBCR: 20.0000 meq | EXTENDED_RELEASE_TABLET | Freq: Every day | ORAL | 3 refills | Status: DC

## 2017-12-09 ENCOUNTER — Ambulatory Visit: Payer: Managed Care, Other (non HMO) | Admitting: Physician Assistant

## 2017-12-09 ENCOUNTER — Other Ambulatory Visit: Payer: Self-pay

## 2017-12-09 ENCOUNTER — Encounter: Payer: Self-pay | Admitting: Physician Assistant

## 2017-12-09 VITALS — BP 124/80 | HR 75 | Temp 98.2°F | Resp 18 | Ht 67.5 in | Wt 233.2 lb

## 2017-12-09 DIAGNOSIS — B9689 Other specified bacterial agents as the cause of diseases classified elsewhere: Secondary | ICD-10-CM

## 2017-12-09 DIAGNOSIS — J988 Other specified respiratory disorders: Secondary | ICD-10-CM | POA: Diagnosis not present

## 2017-12-09 MED ORDER — AZITHROMYCIN 250 MG PO TABS: ORAL_TABLET | ORAL | 0 refills | Status: DC

## 2017-12-09 NOTE — Progress Notes (Signed)
Patient ID: Donna [name] MRN: [id number], DOB: 10/18/1960, 57 y.o. Date of Encounter: 12/09/2017, 11:23 AM    Chief Complaint:  Chief Complaint  Patient presents with  . Otalgia      . Nasal Congestion  . no appetite  . Sore Throat     HPI: 57 y.o. year old female presents with above.    Reports that she has had some mild sore throat off and on over the last 3 to 4 days. She reports that she has been having a lot of nasal congestion and blowing thick mucus from her nose over the past week. She reports that her right ear has also had a little bit of achiness intermittently. Just has not felt well for several days.  Feels rundown.  Was having symptoms of some mild fevers and chills feeling hot then cold off and on yesterday.    Home Meds:   Outpatient Medications Prior to Visit  Medication Sig Dispense Refill  . amLODipine (NORVASC) 10 MG tablet TAKE 1 TABLET BY MOUTH ONCE DAILY 90 tablet 2  . diphenhydrAMINE (BENADRYL) 25 MG tablet Take 25 mg by mouth at bedtime as needed. For sleep    . famotidine (PEPCID) 20 MG tablet Take by mouth.    . hydrochlorothiazide (MICROZIDE) 12.5 MG capsule TAKE 1 CAPSULE BY MOUTH ONCE DAILY 90 capsule 1  . levothyroxine (SYNTHROID, LEVOTHROID) 150 MCG tablet Take 1 tablet (150 mcg total) by mouth daily. 90 tablet 3  . ondansetron (ZOFRAN ODT) 8 MG disintegrating tablet Take 1 tablet (8 mg total) by mouth every 8 (eight) hours as needed for vomiting. 8 tablet 0  . potassium chloride SA (K-DUR,KLOR-CON) 20 MEQ tablet Take 1 tablet (20 mEq total) by mouth daily. 90 tablet 3  . simvastatin (ZOCOR) 20 MG tablet TAKE 1 TABLET BY MOUTH AT BEDTIME 90 tablet 1  . vitamin B-12 (CYANOCOBALAMIN) 1000 MCG tablet Take 1,000 mcg by mouth daily.     No facility-administered medications prior to visit.     Allergies:  Allergies  Allergen Reactions  . Paclitaxel Other (See Comments)    Flushing, Itching, Throat tightness      Review of Systems: See  HPI for pertinent ROS. All other ROS negative.    Physical Exam: Blood pressure 124/80, pulse 75, temperature 98.2 F (36.8 C), temperature source Oral, resp. rate 18, height 5' 7.5" (1.715 m), weight 105.8 kg (233 lb 3.2 oz), SpO2 97 %., Body mass index is 35.99 kg/m. General:  WF. Appears in no acute distress. HEENT: Normocephalic, atraumatic, eyes without discharge, sclera non-icteric, nares are without discharge. Bilateral auditory canals clear, TM's are without perforation. Right TM is dull. Left TM normal. Oral cavity moist, posterior pharynx with mild erythema.  No exudate, no peritonsillar abscess.  No tenderness with percussion to frontal or maxillary sinuses bilaterally. Neck: Supple. No thyromegaly. No lymphadenopathy. Lungs: Clear bilaterally to auscultation without wheezes, rales, or rhonchi. Breathing is unlabored. Heart: Regular rhythm. No murmurs, rubs, or gallops. Msk:  Strength and tone normal for age. Extremities/Skin: Warm and dry.  Neuro: Alert and oriented X 3. Moves all extremities spontaneously. Gait is normal. CNII-XII grossly in tact. Psych:  Responds to questions appropriately with a normal affect.     ASSESSMENT AND PLAN:  57 y.o. year old female with   1. Bacterial respiratory infection Discussed that I would send in an antibiotic.  She then states that "Z-Pak usually works wonders for"  her and ask if can use  this.  Discussed that this would be appropriate so we will send in azithromycin.  She is to take this as directed.  Follow-up if symptoms do not resolve within 1 week after completion of antibiotic. - azithromycin (ZITHROMAX) 250 MG tablet; Day 1: Take 2 daily.  Days 2 - 5: Take 1 daily.  Dispense: 6 tablet; Refill: 0   Signed, [address] [place], [place], [facility] 12/09/2017 11:23 AM

## 2018-05-01 ENCOUNTER — Other Ambulatory Visit: Payer: Self-pay | Admitting: Family Medicine

## 2018-06-02 ENCOUNTER — Encounter: Payer: Self-pay | Admitting: Family Medicine

## 2018-06-02 ENCOUNTER — Ambulatory Visit: Payer: No Typology Code available for payment source | Admitting: Family Medicine

## 2018-06-02 VITALS — BP 128/82 | HR 73 | Temp 97.9°F | Resp 15 | Ht 67.5 in | Wt 246.2 lb

## 2018-06-02 DIAGNOSIS — R609 Edema, unspecified: Secondary | ICD-10-CM | POA: Diagnosis not present

## 2018-06-02 DIAGNOSIS — M25562 Pain in left knee: Secondary | ICD-10-CM | POA: Diagnosis not present

## 2018-06-02 DIAGNOSIS — M2550 Pain in unspecified joint: Secondary | ICD-10-CM

## 2018-06-02 DIAGNOSIS — E038 Other specified hypothyroidism: Secondary | ICD-10-CM

## 2018-06-02 MED ORDER — PREDNISONE 20 MG PO TABS: 40.0000 mg | ORAL_TABLET | Freq: Every day | ORAL | 0 refills | Status: AC
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
## 2018-06-02 NOTE — Patient Instructions (Signed)
RICE Therapy for Routine Care of Injuries Many injuries can be cared for with rest, ice, compression, and elevation (RICE therapy). This includes:  Resting the injured part.  Putting ice on the injury.  Putting pressure (compression) on the injury.  Raising the injured part (elevation). Using RICE therapy can help to lessen pain and swelling. Supplies needed:  Ice.  Plastic bag.  Towel.  Elastic bandage.  Pillow or pillows to raise (elevate) your injured body part. How to care for your injury with RICE therapy Rest Limit your normal activities, and try not to use the injured part of your body. You can go back to your normal activities when your doctor says it is okay to do them and you feel okay. Ask your doctor if you should do exercises to help your injury get better. Ice Put ice on the injured area. Do not put ice on your bare skin.  Put ice in a plastic bag.  Place a towel between your skin and the bag.  Leave the ice on for 20 minutes, 2-3 times a day. Use ice on as many days as told by your doctor.  Compression Compression means putting pressure on the injured area. This can be done with an elastic bandage. If an elastic bandage has been put on your injury:  Do not wrap the bandage too tight. Wrap the bandage more loosely if part of your body away from the bandage is blue, swollen, cold, painful, or loses feeling (gets numb).  Take off the bandage and put it on again. Do this every 3-4 hours or as told by your doctor.  See your doctor if the bandage seems to make your problems worse.  Elevation Elevation means keeping the injured area raised. If you can, raise the injured area above your heart or the center of your chest. Contact a doctor if:  You keep having pain and swelling.  Your symptoms get worse. Get help right away if:  You have sudden bad pain at your injury or lower than your injury.  You have redness or more swelling around your injury.  You  have tingling or numbness at your injury or lower than your injury, and it does not go away when you take off the bandage. Summary  Many injuries can be cared for using rest, ice, compression, and elevation (RICE therapy).  You can go back to your normal activities when you feel okay and your doctor says it is okay.  Put ice on the injured area as told by your doctor.  Get help if your symptoms get worse or if you keep having pain and swelling. This information is not intended to replace advice given to you by your health care provider. Make sure you discuss any questions you have with your health care provider. Document Released: 10/22/2007 Document Revised: 01/23/2017 Document Reviewed: 01/23/2017 Elsevier Interactive Patient Education  2019 [employer].   How to Use a Knee Brace  A knee brace is a device that you wear to support your knee, especially if you have arthritis or the knee is healing after an injury or surgery. There are several types of knee braces. Some are designed to prevent an injury (prophylactic brace). These are often worn during sports. Others support an injured knee (functional brace) or keep it still while it heals (rehabilitative brace). People with severe arthritis of the knee may benefit from a brace that takes some pressure off the knee (unloader brace). Most knee braces are made from a  combination of cloth and metal or plastic. You may need to wear a knee brace:  To relieve knee pain.  To help your knee support your weight (improve stability).  To help you walk farther, or to move more easily (improve mobility).  To prevent injury.  To support your knee while it heals from surgery or from an injury. What are the risks? Generally, knee braces are safe to wear. However, problems may occur, including:  Skin irritation that may cause pain and lead to infection.  Making your condition worse if you wear the brace in the wrong way. How to use a knee  brace Different braces will have different instructions for use. Your health care provider will tell you or show you:  How to put on your brace.  How to adjust the brace.  When and how often to wear the brace.  How to remove the brace.  If you need any assistive devices in addition to the brace, such as crutches or a cane. In general, your brace should:  Have the hinge of the brace line up with the bend of your knee.  Have straps, hooks, or tapes that fasten snugly around your leg.  Not feel too tight or too loose. How to care for a knee brace  Check your brace often for signs of damage, such as loose connections or attachments. Your knee brace may get damaged or wear out during normal use.  Wash the fabric parts of your brace with soap and water.  Read the insert that comes with your brace for other specific care instructions. Contact a health care provider if your knee brace:  Is too loose or too tight and you cannot adjust it.  Causes pain, skin redness, swelling, bruising, or irritation.  Is not helping to relieve your problem.  Is making your knee pain worse. Summary  A knee brace is a device that you wear to support your knee, especially if you have arthritis or your knee is healing after an injury or surgery.  Different braces will have different instructions for use. Your health care provider will tell you or show you how to use your knee brace.  Check your brace often for signs of damage, such as loose connections or attachments. Your knee brace may get damaged or wear out during normal use.  Wear your knee brace as told by your health care provider.  Contact a health care provider if your knee brace is not helping to relieve your problem or is making your knee pain worse. This information is not intended to replace advice given to you by your health care provider. Make sure you discuss any questions you have with your health care provider. Document Released:  07/26/2003 Document Revised: 11/18/2017 Document Reviewed: 11/18/2017 Elsevier Interactive Patient Education  2019 [employer]   Knee Effusion  Knee effusion means that you have extra fluid in your knee. This can cause pain. Your knee may be more difficult to bend and move. What are the causes? Common causes are:  Arthritis.  Infection.  Injury.  Autoimmune disease. This means that your body's defense system (immune system) mistakenly attacks healthy body tissues. Follow these instructions at home: Medicines  Take medicines only as told by your doctor.  Do not drive or use heavy machinery while taking prescription pain medicine.  If you are taking prescription pain medicine, take actions to help prevent or treat trouble pooping (constipation). Your doctor may recommend that you: ? Drink enough fluid to  keep your pee (urine) pale yellow. ? Eat foods that are high in fiber. These include fresh fruits and vegetables, whole grains, and beans. ? Avoid eating fatty or sweet foods. ? Take a medicine for constipation. If you have a brace:  Wear the brace as told by your doctor. Remove it only as told by your doctor.  Loosen the brace if your toes tingle, become numb, or turn cold and blue.  Keep the brace clean.  If the brace is not waterproof: ? Do not let it get wet. ? Cover it with a watertight covering when you take a bath or a shower. Managing pain, stiffness, and swelling   If told, apply ice to the swollen area: ? If you have a removable brace, remove it as told by your doctor. ? Put ice in a plastic bag. ? Place a towel between your skin and the bag. ? Leave the ice on for 20 minutes, 2-3 times per day.  Keep your knee raised (elevated) when you are sitting or lying down. General instructions  Do not use any products that contain nicotine or tobacco, such as cigarettes and e-cigarettes. These can delay healing. If you need help quitting, ask your doctor.  Use  crutches as told by your doctor.  Do exercises as told by your doctor.  Rest as told by your doctor.  Keep all follow-up visits as told by your doctor. This is important. Contact a doctor if you:  Continue to have pain in your knee. Get help right away if you:  Have swelling or redness of your knee that gets worse or does not get better.  Have serious pain in your knee.  Have a fever. Summary  Knee effusion is when you have extra fluid in your knee. This causes pain and swelling and makes it hard to bend and move your knee.  Take medicines only as told by your doctor.  If you have a brace, wear the brace as told by your doctor. This information is not intended to replace advice given to you by your health care provider. Make sure you discuss any questions you have with your health care provider. Document Released: 06/07/2010 Document Revised: 05/17/2017 Document Reviewed: 05/17/2017 Elsevier Interactive Patient Education  2019 [employer].

## 2018-06-03 LAB — CBC WITH DIFFERENTIAL/PLATELET
Absolute Monocytes: 461 cells/uL (ref 200–950)
Basophils Absolute: 38 cells/uL (ref 0–200)
Basophils Relative: 0.4 %
Eosinophils Absolute: 163 cells/uL (ref 15–500)
Eosinophils Relative: 1.7 %
HCT: 39.3 % (ref 35.0–45.0)
Hemoglobin: 13.4 g/dL (ref 11.7–15.5)
Lymphs Abs: 1392 cells/uL (ref 850–3900)
MCH: 28.5 pg (ref 27.0–33.0)
MCHC: 34.1 g/dL (ref 32.0–36.0)
MCV: 83.4 fL (ref 80.0–100.0)
MPV: 9.6 fL (ref 7.5–12.5)
Monocytes Relative: 4.8 %
Neutro Abs: 7546 cells/uL (ref 1500–7800)
Neutrophils Relative %: 78.6 %
Platelets: 394 10*3/uL (ref 140–400)
RBC: 4.71 10*6/uL (ref 3.80–5.10)
RDW: 13 % (ref 11.0–15.0)
Total Lymphocyte: 14.5 %
WBC: 9.6 10*3/uL (ref 3.8–10.8)

## 2018-06-03 LAB — COMPLETE METABOLIC PANEL WITH GFR
AG Ratio: 1.6 (calc) (ref 1.0–2.5)
ALT: 12 U/L (ref 6–29)
AST: 14 U/L (ref 10–35)
Albumin: 4.1 g/dL (ref 3.6–5.1)
Alkaline phosphatase (APISO): 105 U/L (ref 33–130)
BUN: 12 mg/dL (ref 7–25)
CO2: 27 mmol/L (ref 20–32)
Calcium: 9.5 mg/dL (ref 8.6–10.4)
Chloride: 101 mmol/L (ref 98–110)
Creat: 0.77 mg/dL (ref 0.50–1.05)
GFR, Est African American: 98 mL/min/{1.73_m2} (ref >=60)
GFR, Est Non African American: 85 mL/min/{1.73_m2} (ref >=60)
Globulin: 2.6 g/dL (calc) (ref 1.9–3.7)
Glucose, Bld: 108 mg/dL — ABNORMAL HIGH (ref 65–99)
Potassium: 3.6 mmol/L (ref 3.5–5.3)
Sodium: 141 mmol/L (ref 135–146)
Total Bilirubin: 0.3 mg/dL (ref 0.2–1.2)
Total Protein: 6.7 g/dL (ref 6.1–8.1)

## 2018-06-03 LAB — TSH: TSH: 2.28 mIU/L (ref 0.40–4.50)

## 2018-06-03 LAB — T4, FREE: Free T4: 1.3 ng/dL (ref 0.8–1.8)

## 2018-06-03 LAB — T3, FREE: T3, Free: 2.5 pg/mL (ref 2.3–4.2)

## 2018-06-04 ENCOUNTER — Encounter (INDEPENDENT_AMBULATORY_CARE_PROVIDER_SITE_OTHER): Payer: Self-pay | Admitting: Family Medicine

## 2018-06-04 ENCOUNTER — Ambulatory Visit (INDEPENDENT_AMBULATORY_CARE_PROVIDER_SITE_OTHER): Payer: Managed Care, Other (non HMO)

## 2018-06-04 ENCOUNTER — Ambulatory Visit (INDEPENDENT_AMBULATORY_CARE_PROVIDER_SITE_OTHER): Payer: Managed Care, Other (non HMO) | Admitting: Family Medicine

## 2018-06-04 DIAGNOSIS — M25562 Pain in left knee: Secondary | ICD-10-CM

## 2018-06-04 MED ORDER — COLCHICINE 0.6 MG PO TABS: 0.6000 mg | ORAL_TABLET | Freq: Two times a day (BID) | ORAL | 3 refills | Status: DC | PRN

## 2018-06-04 MED ORDER — ETODOLAC 400 MG PO TABS: 400.0000 mg | ORAL_TABLET | Freq: Two times a day (BID) | ORAL | 3 refills | Status: DC | PRN

## 2018-06-04 NOTE — Progress Notes (Signed)
Office Visit Note   Patient: Donna [name]           Date of Birth: 02/13/1961           MRN: [id number] Visit Date: 06/04/2018 Requested by: Delsa Grana, PA-C [address], [place] [address] PCP: Susy Frizzle, MD  Subjective: Chief Complaint  Patient presents with  . Left Knee - Pain    Crackling and crunchy behind kneecap x months.  Pain and swelling x 7 days, NKI. 60 - 70% better after starting oral steroid yesterday.    HPI: She is a 58 year old with left knee pain.  Intermittent symptoms for the past 6 or 7 months, but in the past week she has had a significant worsening of pain.  She saw her PCP and was placed on prednisone which has helped dramatically with her pain.  Her pain is primarily anterolateral, worse when going up steps and better when at rest.  She has noticed some swelling and some tightness in the back of her knee as well.  She also has stiffness and swelling of the fingers of her right hand.  She is never had troubles with her knees prior to this year but they always make a crunching noise when she moves them.  She has a family history of gout in her brothers but she has never had gout herself.                ROS: She has hypertension and GERD, hypothyroidism.  Other systems were reviewed and are negative.  Objective: Vital Signs: There were no vitals taken for this visit.  Physical Exam:  Left knee: 2+ effusion with no warmth or erythema.  2+ patellofemoral crepitus in both knees.  No pain with patella compression today.  She is tender on the lateral joint line but has no palpable click with McMurray's.  Ligaments feel stable.  She has what feels like a popliteal cyst.  Imaging: X-rays left knee: Moderate tricompartmental DJD, worst in the patellofemoral compartment.  No sign of loose body or stress fracture.  Assessment & Plan: 1.  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   Cannot completely rule out gout. -Finish steroids, then Lodine as needed.  Glucosamine sulfate, straight leg raises for strengthening.  If she has recurrent severe pain we will aspirate the knee and send for crystal analysis.   Follow-Up Instructions: No follow-ups on file.      Procedures: No procedures performed  No notes on file    PMFS History: Patient Active Problem List   Diagnosis Date Noted  . Nodule of vagina 02/03/2017  . Pulmonary nodule 02/03/2017  . Primary malignant neoplasm of ovary ([place]) 12/07/2014  . Unspecified hypothyroidism 12/03/2012  . HLD (hyperlipidemia) 12/03/2012  . OVERWEIGHT 06/22/2007  . NICOTINE ADDICTION 06/22/2007  . DEPRESSION 06/22/2007  . Essential hypertension 06/22/2007  . GERD 06/22/2007  . OSTEOARTHRITIS 06/22/2007   Past Medical History:  Diagnosis Date  . Anxiety   . Arthritis   . Cancer ([place])    ovarian, recurrent on chemo (carboplatin, doxorubicin) at [facility]  . Chest pain   . Colon polyp   . Constipation   . Depression   . Full dentures   . GERD (gastroesophageal reflux disease)   . Hemorrhoid   . HLD (hyperlipidemia) 12/03/2012  . Hyperlipidemia   . Hypertension   . Hypothyroidism   . IBS (irritable bowel syndrome)   . Neck  mass   . Neuropathy    feet  . Numbness and tingling in hands   . Numbness in both legs   . Palpitations   . Schatzki's ring   . Trouble swallowing   . Wears glasses     Family History  Problem Relation Age of Onset  . Heart attack Mother   . Hypertension Mother   . Diabetes Mother   . Heart disease Mother   . CVA Father   . Hypertension Father   . Diabetes Father   . Stroke Father   . Cancer Brother        lung cancer - stage 4  . Cancer Brother        skin   . Heart disease Maternal Grandfather     Past Surgical History:  Procedure Laterality Date  . ABDOMINAL HYSTERECTOMY    . COLONOSCOPY    . CYST REMOVAL NECK  05/13/12  . EAR CYST EXCISION  05/13/2012   Procedure: CYST  REMOVAL;  Surgeon: Ralene Ok, MD;  Location: [facility];  Service: General;  Laterality: Left;  excision of neck cyst  . THYROID SURGERY  10/2011 - approximate   ablation  . TONSILLECTOMY    . UPPER GI ENDOSCOPY     Social History   Occupational History  . Not on file  Tobacco Use  . Smoking status: Former Smoker    Years: 32.00    Last attempt to quit: 06/19/2011    Years since quitting: 6.9  . Smokeless tobacco: Never Used  Substance and Sexual Activity  . Alcohol use: No  . Drug use: No  . Sexual activity: Yes

## 2018-06-07 ENCOUNTER — Encounter: Payer: Self-pay | Admitting: Family Medicine

## 2018-06-09 ENCOUNTER — Telehealth (INDEPENDENT_AMBULATORY_CARE_PROVIDER_SITE_OTHER): Payer: Self-pay

## 2018-06-09 NOTE — Telephone Encounter (Signed)
Patient called stating that she is having pain and swelling in her hands and her left knee, but states that her right hand is the worse. Having trouble gripping and holding things. Has an appointment scheduled for Thursday, 06/10/2018.  Cb# is [phone].  Please advise.  Thank you.

## 2018-06-09 NOTE — Telephone Encounter (Signed)
I called and advised the patient Dr. Junius Roads said he will evaluate her hands at tomorrow's visit, along with rechecking her knee.

## 2018-06-10 ENCOUNTER — Ambulatory Visit (INDEPENDENT_AMBULATORY_CARE_PROVIDER_SITE_OTHER): Payer: Managed Care, Other (non HMO) | Admitting: Family Medicine

## 2018-06-10 ENCOUNTER — Encounter (INDEPENDENT_AMBULATORY_CARE_PROVIDER_SITE_OTHER): Payer: Self-pay | Admitting: Family Medicine

## 2018-06-10 DIAGNOSIS — M79641 Pain in right hand: Secondary | ICD-10-CM

## 2018-06-10 DIAGNOSIS — M25562 Pain in left knee: Secondary | ICD-10-CM | POA: Diagnosis not present

## 2018-06-10 DIAGNOSIS — M79642 Pain in left hand: Secondary | ICD-10-CM | POA: Diagnosis not present

## 2018-06-10 MED ORDER — PREDNISONE 10 MG PO TABS: ORAL_TABLET | ORAL | 1 refills | Status: DC

## 2018-06-10 MED ORDER — LIDOCAINE HCL 1 % IJ SOLN
5.0000 mL | INTRAMUSCULAR | Status: AC | PRN
  Administered 2018-06-10: 5 mL

## 2018-06-10 NOTE — Progress Notes (Signed)
Office Visit Note   Patient: Donna [name]           Date of Birth: 01/10/1961           MRN: [id number] Visit Date: 06/10/2018 Requested by: Susy Frizzle, MD [address] [place],  [address] PCP: Susy Frizzle, MD  Subjective: Chief Complaint  Patient presents with  . Left Knee - Pain, Follow-up    Knee felt better over the weekend. Finished the Prednisone on Sunday. Pain returned some on Monday. Started etodolac, colchicine and glucosamine on Monday.  . Bil hand swelling x 10-11 days    HPI: She is here with recurrent left knee pain as well as bilateral hand pain, finger swelling and stiffness, elbow pain and left shoulder pain.  Since last visit her oral steroid prescription ran out and soon thereafter she started having pain again.  It has been severe for the past 2 or 3 days.  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              ROS: Otherwise noncontributory, no fevers or chills or night sweats.  Objective: Vital Signs: There were no vitals taken for this visit.  Physical Exam:  Hands: She has swelling and stiffness of the PIP joints of her second through fourth fingers of both hands.  No erythema or warmth.  There is slight swelling of her right wrist joint with no warmth or erythema.  No swelling in the elbows. Left knee: 1-2+ effusion with no warmth or erythema.  Imaging: None today.  Assessment & Plan: 1.  Recurrent left knee and bilateral hand pain with swelling -Discussed with patient and elected to aspirate the left knee and send the fluid for crystal analysis.  We will also draw some labs, arthritis panel. -Resume prednisone for now and keep her out of work for 1 more week.   Follow-Up Instructions: No follow-ups on file.      Procedures: Large Joint Inj: L knee on 06/10/2018 8:42 AM Indications: joint swelling and pain Details: 18 G 1.5 in needle, superolateral approach Medications: 5 mL lidocaine 1 % Aspirate:  clear and yellow   Only about 8 cc obtained. Consent was given by the patient.      No notes on file    PMFS History: Patient Active Problem List   Diagnosis Date Noted  . Nodule of vagina 02/03/2017  . Pulmonary nodule 02/03/2017  . Primary malignant neoplasm of ovary ([facility]) 12/07/2014  . Unspecified hypothyroidism 12/03/2012  . HLD (hyperlipidemia) 12/03/2012  . OVERWEIGHT 06/22/2007  . NICOTINE ADDICTION 06/22/2007  . DEPRESSION 06/22/2007  . Essential hypertension 06/22/2007  . GERD 06/22/2007  . OSTEOARTHRITIS 06/22/2007   Past Medical History:  Diagnosis Date  . Anxiety   . Arthritis   . Cancer ([facility])    ovarian, recurrent on chemo (carboplatin, doxorubicin) at [facility]  . Chest pain   . Colon polyp   . Constipation   . Depression   . Full dentures   . GERD (gastroesophageal reflux disease)   . Hemorrhoid   . HLD (hyperlipidemia) 12/03/2012  . Hyperlipidemia   . Hypertension   . Hypothyroidism   . IBS (irritable bowel syndrome)   . Neck mass   . Neuropathy    feet  . Numbness and tingling in hands   . Numbness in both legs   . Palpitations   . Schatzki's ring   . Trouble swallowing   .  Wears glasses     Family History  Problem Relation Age of Onset  . Heart attack Mother   . Hypertension Mother   . Diabetes Mother   . Heart disease Mother   . CVA Father   . Hypertension Father   . Diabetes Father   . Stroke Father   . Cancer Brother        lung cancer - stage 4  . Cancer Brother        skin   . Heart disease Maternal Grandfather     Past Surgical History:  Procedure Laterality Date  . ABDOMINAL HYSTERECTOMY    . COLONOSCOPY    . CYST REMOVAL NECK  05/13/12  . EAR CYST EXCISION  05/13/2012   Procedure: CYST REMOVAL;  Surgeon: Ralene Ok, MD;  Location: [facility];  Service: General;  Laterality: Left;  excision of neck cyst  . THYROID SURGERY  10/2011 - approximate   ablation  . TONSILLECTOMY    . UPPER GI ENDOSCOPY     Social History    Occupational History  . Not on file  Tobacco Use  . Smoking status: Former Smoker    Years: 32.00    Last attempt to quit: 06/19/2011    Years since quitting: 6.9  . Smokeless tobacco: Never Used  Substance and Sexual Activity  . Alcohol use: No  . Drug use: No  . Sexual activity: Yes

## 2018-06-11 ENCOUNTER — Telehealth (INDEPENDENT_AMBULATORY_CARE_PROVIDER_SITE_OTHER): Payer: Self-pay | Admitting: Family Medicine

## 2018-06-11 LAB — TIQ-NTM

## 2018-06-11 NOTE — Telephone Encounter (Signed)
Knee fluid is unremarkable, no crystals seen.

## 2018-06-14 ENCOUNTER — Other Ambulatory Visit (INDEPENDENT_AMBULATORY_CARE_PROVIDER_SITE_OTHER): Payer: Self-pay

## 2018-06-15 ENCOUNTER — Telehealth (INDEPENDENT_AMBULATORY_CARE_PROVIDER_SITE_OTHER): Payer: Self-pay

## 2018-06-15 NOTE — Telephone Encounter (Signed)
I called the patient and explained the delay in getting all of her lab results (see previous phone call message to [facility] from today). We should receive the results within the next couple of days.

## 2018-06-15 NOTE — Telephone Encounter (Signed)
FYI: I called [facility] and spoke with Tiffany to clear up confusion on the arthritis panel that was ordered - that panel could not be pulled up in the [facility] system. Therefore, I changed that order to RF, ANA and uric acid (an ESR could not be ordered because the specimen was no longer stable - greater than 24 hours). She will add these to the lab order, along with the diagnosis code for bilateral hand pain. Will await results.

## 2018-06-16 ENCOUNTER — Telehealth (INDEPENDENT_AMBULATORY_CARE_PROVIDER_SITE_OTHER): Payer: Self-pay | Admitting: Family Medicine

## 2018-06-16 ENCOUNTER — Encounter (INDEPENDENT_AMBULATORY_CARE_PROVIDER_SITE_OTHER): Payer: Self-pay | Admitting: Family Medicine

## 2018-06-16 DIAGNOSIS — M25562 Pain in left knee: Secondary | ICD-10-CM

## 2018-06-16 DIAGNOSIS — M79641 Pain in right hand: Secondary | ICD-10-CM

## 2018-06-16 DIAGNOSIS — M79642 Pain in left hand: Secondary | ICD-10-CM

## 2018-06-16 DIAGNOSIS — M0579 Rheumatoid arthritis with rheumatoid factor of multiple sites without organ or systems involvement: Secondary | ICD-10-CM

## 2018-06-16 MED ORDER — COLCHICINE 0.6 MG PO TABS: 0.6000 mg | ORAL_TABLET | Freq: Two times a day (BID) | ORAL | 6 refills | Status: DC | PRN

## 2018-06-16 NOTE — Addendum Note (Signed)
Addended by: Hortencia Pilar on: 06/16/2018 03:00 PM   Modules accepted: Orders

## 2018-06-16 NOTE — Telephone Encounter (Signed)
Rheumatoid factor is positive at 317.  ANA is still pending.  Uric acid level is slightly elevated at 7.6, but there were no gout crystals in the synovial fluid.

## 2018-06-18 LAB — SYNOVIAL CELL COUNT + DIFF, W/ CRYSTALS
Basophils, %: 0 %
Eosinophils-Synovial: 0 % (ref 0–2)
Lymphocytes-Synovial Fld: 69 % (ref 0–74)
Monocyte/Macrophage: 29 % (ref 0–69)
Neutrophil, Synovial: 2 % (ref 0–24)
Synoviocytes, %: 0 % (ref 0–15)
WBC, Synovial: 889 cells/uL — ABNORMAL HIGH (ref <150)

## 2018-06-18 LAB — TEST AUTHORIZATION

## 2018-06-18 LAB — RHEUMATOID FACTOR: Rheumatoid fact SerPl-aCnc: 317 IU/mL — ABNORMAL HIGH (ref <14)

## 2018-06-18 LAB — EXTRA LAV TOP TUBE

## 2018-06-18 LAB — URIC ACID: Uric Acid, Serum: 7.6 mg/dL — ABNORMAL HIGH (ref 2.5–7.0)

## 2018-06-18 LAB — ANA: Anti Nuclear Antibody(ANA): NEGATIVE

## 2018-06-23 ENCOUNTER — Other Ambulatory Visit: Payer: Self-pay | Admitting: Family Medicine

## 2018-06-25 ENCOUNTER — Encounter (INDEPENDENT_AMBULATORY_CARE_PROVIDER_SITE_OTHER): Payer: Self-pay | Admitting: Family Medicine

## 2018-06-28 ENCOUNTER — Other Ambulatory Visit: Payer: Self-pay | Admitting: *Deleted

## 2018-06-28 MED ORDER — AMLODIPINE BESYLATE 10 MG PO TABS: 10.0000 mg | ORAL_TABLET | Freq: Every day | ORAL | 0 refills | Status: DC

## 2018-07-13 NOTE — Progress Notes (Signed)
Office Visit Note  Patient: Donna [name]             Date of Birth: [birth year]           MRN: [id number]             PCP: Susy Frizzle, MD Referring: Eunice Blase, MD Visit Date: 07/27/2018 Occupation: Paramedic  Subjective:  Pain and swelling in multiple joints.   History of Present Illness: Donna [name] is a 58 y.o. female seen in consultation per request of Dr. Karren Burly.  According to patient in January 2020 she developed sudden pain and swelling in her left knee joint.  She tried ice pack without results.  She was seen by her PCP a week later and was given prednisone taper.  She was also referred to Dr. Karren Burly.  She states at that time Dr. Karren Burly did x-ray and started her on anti-inflammatories which did not help.  She went for a follow-up visit with increased pain and swelling in her left knee joint.  At the time she was also having swelling in her bilateral hands.  She states Dr. Karren Burly aspirated her left knee joint which was negative for crystals.  Labs were also drawn which showed positive rheumatoid factor.  She was placed on colchicine and prednisone taper.  She states once she finished the prednisone taper her symptoms flared again and she was given a second course of prednisone.  She is currently taking prednisone 10 mg 3 times a day.  She is also taking ibuprofen.  Despite of that she continues to have a lot of pain and swelling in her joints.  She could describes pain in her bilateral elbows, bilateral ankles and feet occasionally and persistent pain and swelling in her bilateral hands and knee joints.  She was also diagnosed with ovarian cancer in 2016 for which she had 2 rounds of chemotherapy.  She states recently she was seen by her oncologist and her C 125 was elevated.  She has follow-up visit with the oncologist.    Activities of Daily Living:  Patient reports morning stiffness for 24 hours.   Patient Reports nocturnal pain.  Difficulty dressing/grooming:  Reports Difficulty climbing stairs: Reports Difficulty getting out of chair: Reports Difficulty using hands for taps, buttons, cutlery, and/or writing: Denies  Review of Systems  Constitutional: Positive for fatigue.  HENT: Negative for mouth sores, mouth dryness and nose dryness.   Eyes: Negative for dryness.  Respiratory: Negative for shortness of breath and difficulty breathing.   Cardiovascular: Negative for chest pain.  Gastrointestinal: Positive for constipation and diarrhea. Negative for blood in stool.  Endocrine: Positive for increased urination.  Genitourinary: Negative for difficulty urinating and painful urination.  Musculoskeletal: Positive for arthralgias, joint pain, joint swelling, muscle weakness, morning stiffness and muscle tenderness.  Skin: Negative for color change, rash, hair loss and sensitivity to sunlight.  Allergic/Immunologic: Negative for susceptible to infections.  Neurological: Negative for dizziness, fainting and headaches.  Hematological: Negative for bruising/bleeding tendency and swollen glands.  Psychiatric/Behavioral: Positive for depressed mood and sleep disturbance. The patient is nervous/anxious.     PMFS History:  Patient Active Problem List   Diagnosis Date Noted  . Nodule of vagina 02/03/2017  . Pulmonary nodule 02/03/2017  . Primary malignant neoplasm of ovary ([place]) 12/07/2014  . Hypothyroidism 12/03/2012  . HLD (hyperlipidemia) 12/03/2012  . OVERWEIGHT 06/22/2007  . NICOTINE ADDICTION 06/22/2007  . DEPRESSION 06/22/2007  . Essential hypertension 06/22/2007  . GERD 06/22/2007  .  OSTEOARTHRITIS 06/22/2007    Past Medical History:  Diagnosis Date  . Anxiety   . Arthritis   . Cancer ([place])    ovarian, recurrent on chemo (carboplatin, doxorubicin) at [facility]  . Chest pain   . Colon polyp   . Constipation   . Depression   . Full dentures   . GERD (gastroesophageal reflux disease)   . Hemorrhoid   . HLD (hyperlipidemia) 12/03/2012  .  Hyperlipidemia   . Hypertension   . Hypothyroidism   . IBS (irritable bowel syndrome)   . Neck mass   . Neuropathy    feet  . Numbness and tingling in hands   . Numbness in both legs   . Palpitations   . Schatzki's ring   . Trouble swallowing   . Wears glasses     Family History  Problem Relation Age of Onset  . Heart attack Mother   . Hypertension Mother   . Diabetes Mother   . Heart disease Mother   . CVA Father   . Hypertension Father   . Diabetes Father   . Stroke Father   . Cancer Brother        lung cancer - stage 4  . Cancer Brother        skin   . Heart disease Maternal Grandfather   . Heart attack Sister        CABG  . Diabetes Sister   . Stroke Sister   . Hypertension Sister   . Hyperlipidemia Sister   . Kidney disease Sister   . Stroke Sister   . Heart attack Sister    Past Surgical History:  Procedure Laterality Date  . ABDOMINAL HYSTERECTOMY    . ABLATION    . COLONOSCOPY    . CYST REMOVAL NECK  05/13/12  . EAR CYST EXCISION  05/13/2012   Procedure: CYST REMOVAL;  Surgeon: Ralene Ok, MD;  Location: [facility];  Service: General;  Laterality: Left;  excision of neck cyst  . THYROID SURGERY  10/2011 - approximate   ablation  . TONSILLECTOMY    . UPPER GI ENDOSCOPY     Social History   Social History Narrative  . Not on file   Immunization History  Administered Date(s) Administered  . Influenza,inj,Quad PF,6+ Mos 03/23/2015, 02/11/2016, 04/07/2017, 03/12/2018  . Pneumococcal Polysaccharide-23 11/04/2017     Objective: Vital Signs: BP (!) 153/101 (BP Location: Right Arm, Patient Position: Sitting, Cuff Size: Large)   Pulse 63   Resp 16   Ht 5\' 7"  (1.702 m)   Wt 250 lb 3.2 oz (113.5 kg)   BMI 39.19 kg/m    Physical Exam Vitals signs and nursing note reviewed.  Constitutional:      Appearance: She is well-developed.  HENT:     Head: Normocephalic and atraumatic.  Eyes:     Conjunctiva/sclera: Conjunctivae normal.  Neck:      Musculoskeletal: Normal range of motion.  Cardiovascular:     Rate and Rhythm: Normal rate and regular rhythm.     Heart sounds: Normal heart sounds.  Pulmonary:     Effort: Pulmonary effort is normal.     Breath sounds: Normal breath sounds.  Abdominal:     General: Bowel sounds are normal.     Palpations: Abdomen is soft.  Lymphadenopathy:     Cervical: No cervical adenopathy.  Skin:    General: Skin is warm and dry.     Capillary Refill: Capillary refill takes less than 2 seconds.  Neurological:     Mental Status: She is alert and oriented to person, place, and time.  Psychiatric:        Behavior: Behavior normal.      Musculoskeletal Exam: C-spine good range of motion.  Shoulder joints elbow joints with good range of motion with some discomfort.  She had painful range of motion of bilateral wrist joints.  Synovitis was noted over several of her MCPs and PIPs as described below.  She has bilateral knee joint effusion and warmth and swelling.  She tenderness on palpation of her left ankle joint.  Tenderness over MTPs was noted as described below.  CDAI Exam: CDAI Score: 24.6  Patient Global Assessment: 8 (mm); Provider Global Assessment: 8 (mm) Swollen: 10 ; Tender: 16  Joint Exam      Right  Left  Elbow   Tender     Wrist   Tender   Tender  MCP 2  Swollen Tender     IP  Swollen Tender     PIP 2  Swollen Tender  Swollen Tender  PIP 3  Swollen Tender  Swollen Tender  PIP 4  Swollen Tender     PIP 5  Swollen Tender     Knee  Swollen Tender  Swollen Tender  Ankle      Tender  MTP 2   Tender     MTP 3   Tender        Investigation: Findings:  06/10/18: ANA-, RF 317, uric acid 7.6  Left knee synovial analysis: WBC count 889, orange color  Component     Latest Ref Rng & Units 06/10/2018  Site      LEFT KNEE  Color, Synovial     STRAW/YELL ORANGE (A)  Appearance-Synovial     CLEAR/HAZY HAZY  WBC, Synovial     <150 cells/uL 889 (H)  Neutrophil, Synovial     0 -  24 % 2  Lymphocytes-Synovial Fld     0 - 74 % 69  Monocyte/Macrophage     0 - 69 % 29  Eosinophils-Synovial     0 - 2 % 0  Basophils, %     0 % 0  Synoviocytes, %     0 - 15 % 0  Crystals     NONE SEEN /HPF   Mucin Clot     GOOD   QUESTION/PROBLEM:        Specimen(s) Received:      SS  EXTRA LAVENDER-TOP TUBE        Uric Acid, Serum     2.5 - 7.0 mg/dL 7.6 (H)  Anti Nuclear Antibody(ANA)     NEGATIVE NEGATIVE  RA Latex Turbid.     <14 IU/mL 317 (H)  June 2019 hepatitis C negative, HIV negative, TSH 0.36 Imaging: Xr Foot 2 Views Left  Result Date: 07/27/2018 Juxta-articular osteopenia was noted.  PIP and DIP narrowing was noted.  First MTP narrowing was noted.  No intertarsal joint space narrowing was noted.  Dorsal spurring and inferior calcaneal spur was noted. Impression: These findings are consistent with inflammatory arthritis and osteoarthritis overlap.  Xr Foot 2 Views Right  Result Date: 07/27/2018 First MTP narrowing PIP and DIP narrowing was noted.  Juxta-articular osteopenia was noted.  Possible cystic versus erosive changes noted in the first PIP joint.  No intertarsal joint space narrowing was noted.  Calcaneal spur was noted. Impression: These findings are consistent with osteoarthritis and inflammatory arthritis.  Xr Hand 2 View Left  Result Date: 07/27/2018 Juxta-articular osteopenia was noted.  Soft tissue swelling was noted.  Narrowing of PIP joints was noted.  CMC narrowing was noted.  No intercarpal or radiocarpal joint space narrowing was noted.  No erosive changes were noted. Impression: These findings are consistent with inflammatory arthritis and osteoarthritis overlap.  Xr Hand 2 View Right  Result Date: 07/27/2018 Juxta-articular osteopenia was noted.  Soft tissue swelling was noted.  PIP DIP and CMC narrowing was noted.  No intercarpal radiocarpal joint space narrowing was noted.  Possible erosion versus cystic change was noted over the radius.  Impression: These findings are consistent with rheumatoid arthritis and osteoarthritis overlap.  Xr Knee 3 View Right  Result Date: 07/27/2018 Moderate medial compartment narrowing was noted.  No chondrocalcinosis was noted.  Effusion was noted.  Severe patellofemoral narrowing was noted. Impression: These findings are consistent with moderate osteoarthritis and inflammatory arthritis.   Recent Labs: Lab Results  Component Value Date   WBC 9.6 06/02/2018   HGB 13.4 06/02/2018   PLT 394 06/02/2018   NA 141 06/02/2018   K 3.6 06/02/2018   CL 101 06/02/2018   CO2 27 06/02/2018   GLUCOSE 108 (H) 06/02/2018   BUN 12 06/02/2018   CREATININE 0.77 06/02/2018   BILITOT 0.3 06/02/2018   ALKPHOS 89 06/30/2016   AST 14 06/02/2018   ALT 12 06/02/2018   PROT 6.7 06/02/2018   ALBUMIN 4.1 06/30/2016   CALCIUM 9.5 06/02/2018   GFRAA 98 06/02/2018    Speciality Comments: No specialty comments available.  Procedures:  No procedures performed Allergies: Paclitaxel   Assessment / Plan:     Visit Diagnoses: Rheumatoid arthritis with rheumatoid factor of multiple sites without organ or systems involvement ([place]) - 06/10/18: RF 317, ANA-, Uric acid 7.6 -patient has severe rheumatoid arthritis with pain and swelling in multiple joints.  Detailed counseling was provided and different treatment options were discussed.  I will obtain following labs before any treatment could be initiated.  Plan: Urinalysis, Routine w reflex microscopic, Sedimentation rate, Cyclic citrul peptide antibody, IgG, 14-3-3 eta Protein.  Due to severe nature of her rheumatoid arthritis she is currently totally disabled.  I will give her a work note until the end of March.  High risk medication use -currently on prednisone 30 mg p.o. daily despite of that she has significant swelling and bilateral knee joint effusions.  I handout on methotrexate was given today.  Indication side effects contraindications were discussed.  Consent was  taken.  Once the labs are available we can start her on methotrexate 15 mg subcu weekly and increase it as tolerated.  She will also need labs every 2 weeks x 2 and then every 2 months to monitor for drug toxicity.  Most likely she will need combination therapy.  Plan: CBC with Differential/Platelet, COMPLETE METABOLIC PANEL WITH GFR, Hepatitis B core antibody, IgM, Hepatitis B surface antigen, QuantiFERON-TB Gold Plus, Serum protein electrophoresis with reflex, IgG, IgA, IgM, Glucose 6 phosphate dehydrogenase, DG Chest 2 View  Bilateral hand pain -x-rays were consistent with rheumatoid arthritis with soft tissue swelling and juxta-articular osteopenia.  Plan: XR Hand 2 View Right, XR Hand 2 View Left  Chronic pain of left knee - 06/10/18: Left knee synovial analysis-WBC count 889, orange color.  The synovial fluid was negative for crystals.  I will advise her to discontinue colchicine.  Chronic pain of right knee -x-ray showed moderate osteoarthritis and severe chondromalacia patella.  Also she has significant effusion in bilateral knee  joints today.  Plan: XR KNEE 3 VIEW RIGHT  Pain in both feet -he has tenderness in her feet but no warmth swelling or synovitis was noted.  Plan: XR Foot 2 Views Right, XR Foot 2 Views Left  Essential hypertension-her blood pressure is elevated.  She is also on prednisone which does not help.  Have advised her to monitor blood pressure closely and follow-up with her PCP.  History of gastroesophageal reflux (GERD)  History of IBS  History of colonic polyps  History of hyperlipidemia  Pulmonary nodule  Primary malignant neoplasm of ovary, unspecified laterality ([place]) - 2016, CTX x2.  Patient states her titers were high recently and she has an appointment coming up with the oncologist.  She may need chemotherapy.  In case we have to use a biologic agent definitely permission from her oncologist.  Other specified hypothyroidism  Anxiety and depression  Other  fatigue - Plan: CK   Orders: Orders Placed This Encounter  Procedures  . XR KNEE 3 VIEW RIGHT  . XR Hand 2 View Right  . XR Hand 2 View Left  . XR Foot 2 Views Right  . XR Foot 2 Views Left  . DG Chest 2 View  . CBC with Differential/Platelet  . COMPLETE METABOLIC PANEL WITH GFR  . Urinalysis, Routine w reflex microscopic  . Sedimentation rate  . Cyclic citrul peptide antibody, IgG  . 14-3-3 eta Protein  . CK  . QuantiFERON-TB Gold Plus  . Serum protein electrophoresis with reflex  . IgG, IgA, IgM  . Glucose 6 phosphate dehydrogenase  . CBC with Differential/Platelet  . COMPLETE METABOLIC PANEL WITH GFR   No orders of the defined types were placed in this encounter.   Face-to-face time spent with patient was 90 minutes. Greater than 50% of time was spent in counseling and coordination of care.  Follow-Up Instructions: Return in about 1 week (around 08/03/2018) for Rheumatoid arthritis.   Bo Merino, MD  Note - This record has been created using Editor, commissioning.  Chart creation errors have been sought, but may not always  have been located. Such creation errors do not reflect on  the standard of medical care.

## 2018-07-14 ENCOUNTER — Encounter (INDEPENDENT_AMBULATORY_CARE_PROVIDER_SITE_OTHER): Payer: Self-pay | Admitting: Family Medicine

## 2018-07-16 ENCOUNTER — Other Ambulatory Visit (INDEPENDENT_AMBULATORY_CARE_PROVIDER_SITE_OTHER): Payer: Self-pay | Admitting: Family Medicine

## 2018-07-16 ENCOUNTER — Encounter (INDEPENDENT_AMBULATORY_CARE_PROVIDER_SITE_OTHER): Payer: Self-pay | Admitting: Family Medicine

## 2018-07-27 ENCOUNTER — Telehealth: Payer: Self-pay | Admitting: Pharmacist

## 2018-07-27 ENCOUNTER — Ambulatory Visit (INDEPENDENT_AMBULATORY_CARE_PROVIDER_SITE_OTHER): Payer: Managed Care, Other (non HMO)

## 2018-07-27 ENCOUNTER — Ambulatory Visit (INDEPENDENT_AMBULATORY_CARE_PROVIDER_SITE_OTHER): Payer: Self-pay

## 2018-07-27 ENCOUNTER — Encounter: Payer: Self-pay | Admitting: Rheumatology

## 2018-07-27 ENCOUNTER — Ambulatory Visit (HOSPITAL_COMMUNITY)
Admission: RE | Admit: 2018-07-27 | Discharge: 2018-07-27 | Disposition: A | Discharge status: Home / Self Care | Payer: Managed Care, Other (non HMO) | Source: Ambulatory Visit | Attending: Rheumatology | Admitting: Rheumatology

## 2018-07-27 ENCOUNTER — Ambulatory Visit: Payer: Managed Care, Other (non HMO) | Admitting: Rheumatology

## 2018-07-27 VITALS — BP 153/101 | HR 63 | Resp 16 | Ht 67.0 in | Wt 250.2 lb

## 2018-07-27 DIAGNOSIS — M25562 Pain in left knee: Secondary | ICD-10-CM | POA: Diagnosis not present

## 2018-07-27 DIAGNOSIS — M79672 Pain in left foot: Secondary | ICD-10-CM | POA: Diagnosis not present

## 2018-07-27 DIAGNOSIS — G8929 Other chronic pain: Secondary | ICD-10-CM

## 2018-07-27 DIAGNOSIS — M79641 Pain in right hand: Secondary | ICD-10-CM

## 2018-07-27 DIAGNOSIS — F419 Anxiety disorder, unspecified: Secondary | ICD-10-CM

## 2018-07-27 DIAGNOSIS — M25561 Pain in right knee: Secondary | ICD-10-CM | POA: Diagnosis not present

## 2018-07-27 DIAGNOSIS — C569 Malignant neoplasm of unspecified ovary: Secondary | ICD-10-CM

## 2018-07-27 DIAGNOSIS — Z8719 Personal history of other diseases of the digestive system: Secondary | ICD-10-CM

## 2018-07-27 DIAGNOSIS — M79671 Pain in right foot: Secondary | ICD-10-CM | POA: Diagnosis not present

## 2018-07-27 DIAGNOSIS — Z79899 Other long term (current) drug therapy: Secondary | ICD-10-CM | POA: Diagnosis not present

## 2018-07-27 DIAGNOSIS — Z8639 Personal history of other endocrine, nutritional and metabolic disease: Secondary | ICD-10-CM

## 2018-07-27 DIAGNOSIS — Z86010 Personal history of colonic polyps: Secondary | ICD-10-CM

## 2018-07-27 DIAGNOSIS — M79642 Pain in left hand: Secondary | ICD-10-CM

## 2018-07-27 DIAGNOSIS — E038 Other specified hypothyroidism: Secondary | ICD-10-CM

## 2018-07-27 DIAGNOSIS — F329 Major depressive disorder, single episode, unspecified: Secondary | ICD-10-CM

## 2018-07-27 DIAGNOSIS — R5383 Other fatigue: Secondary | ICD-10-CM

## 2018-07-27 DIAGNOSIS — M0579 Rheumatoid arthritis with rheumatoid factor of multiple sites without organ or systems involvement: Secondary | ICD-10-CM | POA: Diagnosis not present

## 2018-07-27 DIAGNOSIS — I10 Essential (primary) hypertension: Secondary | ICD-10-CM

## 2018-07-27 DIAGNOSIS — R911 Solitary pulmonary nodule: Secondary | ICD-10-CM

## 2018-07-27 NOTE — Telephone Encounter (Signed)
Received a Prior Authorization request from [employer] for [place]. Authorization has been submitted to patient's insurance via Cover My Meds. Will update once we receive a response.

## 2018-07-27 NOTE — Progress Notes (Signed)
CXR normal.

## 2018-07-27 NOTE — Patient Instructions (Addendum)
Standing Labs We placed an order today for your standing lab work.    Please come back and get your standing labs after starting methotrexate in 2 weeks, 4 weeks, 8 weeks, then every 3 months.  We have open lab Monday through Friday from 8:30-11:30 AM and 1:30-4:00 PM  at the office of Dr. Bo Merino.   You may experience shorter wait times on Monday and Friday afternoons. The office is located at [address] [place], [place], [place] [address] No appointment is necessary.   Labs are drawn by [facility].  You may receive a bill from [facility] for your lab work.  If you wish to have your labs drawn at another location, please call the office 24 hours in advance to send orders.  If you have any questions regarding directions or hours of operation,  please call [phone].   Just as a reminder please drink plenty of water prior to coming for your lab work. Thanks!   Methotrexate tablets What is this medicine? METHOTREXATE (METH oh TREX ate) is a chemotherapy drug used to treat cancer including breast cancer, leukemia, and lymphoma. This medicine can also be used to treat psoriasis and certain kinds of arthritis. This medicine may be used for other purposes; ask your health care provider or pharmacist if you have questions. COMMON BRAND NAME(S): Rheumatrex, Trexall What should I tell my health care provider before I take this medicine? They need to know if you have any of these conditions: -fluid in the stomach area or lungs -if you often drink alcohol -infection or immune system problems -kidney disease or on hemodialysis -liver disease -low blood counts, like low white cell, platelet, or red cell counts -lung disease -radiation therapy -stomach ulcers -ulcerative colitis -an unusual or allergic reaction to methotrexate, other medicines, foods, dyes, or preservatives -pregnant or trying to get pregnant -breast-feeding How should I use this medicine? Take this medicine by  mouth with a glass of water. Follow the directions on the prescription label. Take your medicine at regular intervals. Do not take it more often than directed. Do not stop taking except on your doctor's advice. Make sure you know why you are taking this medicine and how often you should take it. If this medicine is used for a condition that is not cancer, like arthritis or psoriasis, it should be taken weekly, NOT daily. Taking this medicine more often than directed can cause serious side effects, even death. Talk to your healthcare provider about safe handling and disposal of this medicine. You may need to take special precautions. Talk to your pediatrician regarding the use of this medicine in children. While this drug may be prescribed for selected conditions, precautions do apply. Overdosage: If you think you have taken too much of this medicine contact a poison control center or emergency room at once. NOTE: This medicine is only for you. Do not share this medicine with others. What if I miss a dose? If you miss a dose, talk with your doctor or health care professional. Do not take double or extra doses. What may interact with this medicine? This medicine may interact with the following medication: -acitretin -aspirin and aspirin-like medicines including salicylates -azathioprine -certain antibiotics like penicillins, tetracycline, and chloramphenicol -cyclosporine -gold -hydroxychloroquine -live virus vaccines -NSAIDs, medicines for pain and inflammation, like ibuprofen or naproxen -other cytotoxic agents -penicillamine -phenylbutazone -phenytoin -probenecid -retinoids such as isotretinoin and tretinoin -steroid medicines like prednisone or cortisone -sulfonamides like sulfasalazine and trimethoprim/sulfamethoxazole -theophylline This list may not  describe all possible interactions. Give your health care provider a list of all the medicines, herbs, non-prescription drugs, or dietary  supplements you use. Also tell them if you smoke, drink alcohol, or use illegal drugs. Some items may interact with your medicine. What should I watch for while using this medicine? Avoid alcoholic drinks. This medicine can make you more sensitive to the sun. Keep out of the sun. If you cannot avoid being in the sun, wear protective clothing and use sunscreen. Do not use sun lamps or tanning beds/booths. You may need blood work done while you are taking this medicine. Call your doctor or health care professional for advice if you get a fever, chills or sore throat, or other symptoms of a cold or flu. Do not treat yourself. This drug decreases your body's ability to fight infections. Try to avoid being around people who are sick. This medicine may increase your risk to bruise or bleed. Call your doctor or health care professional if you notice any unusual bleeding. Check with your doctor or health care professional if you get an attack of severe diarrhea, nausea and vomiting, or if you sweat a lot. The loss of too much body fluid can make it dangerous for you to take this medicine. Talk to your doctor about your risk of cancer. You may be more at risk for certain types of cancers if you take this medicine. Both men and women must use effective birth control with this medicine. Do not become pregnant while taking this medicine or until at least 1 normal menstrual cycle has occurred after stopping it. Women should inform their doctor if they wish to become pregnant or think they might be pregnant. Men should not father a child while taking this medicine and for 3 months after stopping it. There is a potential for serious side effects to an unborn child. Talk to your health care professional or pharmacist for more information. Do not breast-feed an infant while taking this medicine. What side effects may I notice from receiving this medicine? Side effects that you should report to your doctor or health care  professional as soon as possible: -allergic reactions like skin rash, itching or hives, swelling of the face, lips, or tongue -breathing problems or shortness of breath -diarrhea -dry, nonproductive cough -low blood counts - this medicine may decrease the number of white blood cells, red blood cells and platelets. You may be at increased risk for infections and bleeding. -mouth sores -redness, blistering, peeling or loosening of the skin, including inside the mouth -signs of infection - fever or chills, cough, sore throat, pain or trouble passing urine -signs and symptoms of bleeding such as bloody or black, tarry stools; red or dark-brown urine; spitting up blood or brown material that looks like coffee grounds; red spots on the skin; unusual bruising or bleeding from the eye, gums, or nose -signs and symptoms of kidney injury like trouble passing urine or change in the amount of urine -signs and symptoms of liver injury like dark yellow or brown urine; general ill feeling or flu-like symptoms; light-colored stools; loss of appetite; nausea; right upper belly pain; unusually weak or tired; yellowing of the eyes or skin Side effects that usually do not require medical attention (report to your doctor or health care professional if they continue or are bothersome): -dizziness -hair loss -tiredness -upset stomach -vomiting This list may not describe all possible side effects. Call your doctor for medical advice about side effects. You may report  side effects to FDA at 1-800-FDA-1088. Where should I keep my medicine? Keep out of the reach of children. Store at room temperature between 20 and 25 degrees C (68 and 77 degrees F). Protect from light. Throw away any unused medicine after the expiration date. NOTE: This sheet is a summary. It may not cover all possible information. If you have questions about this medicine, talk to your doctor, pharmacist, or health care provider.  2019 Elsevier/Gold  Standard (2016-12-25 13:38:43) Rheumatoid Arthritis Rheumatoid arthritis (RA) is a long-term (chronic) disease. RA causes inflammation in your joints. Your joints may feel painful, stiff, swollen, and warm. RA may start slowly. It most often affects the small joints of the hands and feet. It can also affect other parts of the body. Symptoms of RA often come and go. There is no cure for RA, but medicines can help your symptoms. What are the causes?  RA is an autoimmune disease. This means that your body's defense system (immune system) attacks healthy parts of your body by mistake. The exact cause of RA is not known. What increases the risk?  Being a woman.  Having a family history of RA or other diseases like RA.  Smoking.  Being overweight.  Being exposed to pollutants or chemicals. What are the signs or symptoms?  Morning stiffness that lasts longer than 30 minutes. This is often the first symptom.  Symptoms start slowly. They are often worse in the morning.  As RA gets worse, symptoms may include: ? Pain, stiffness, swelling, warmth, and tenderness in joints on both sides of your body. ? Loss of energy. ? Not feeling hungry. ? Weight loss. ? A low fever. ? Dry eyes and a dry mouth. ? Firm lumps that grow under your skin. ? Changes in the way your joints look. ? Changes in the way your joints work.  Symptoms vary and they: ? Often come and go. ? Sometimes get worse for a period of time. These are called flares. How is this treated?   Treatment may include: ? Taking good care of yourself. Be sure to rest as needed, eat a healthy diet, and exercise. ? Medicines. These may include:  Pain relievers.  Medicines to help with inflammation.  Disease-modifying antirheumatic drugs (DMARDs).  Medicines called biologic response modifiers. ? Physical therapy and occupational therapy. ? Surgery, if joint damage is very bad. Your doctor will work with you to find the best  treatments. Follow these instructions at home: Activity  Return to your normal activities as told by your doctor. Ask your doctor what activities are safe for you.  Rest when you have a flare.  Exercise as told by your doctor. General instructions  Take over-the-counter and prescription medicines only as told by your doctor.  Keep all follow-up visits as told by your doctor. This is important. Where to find more information  [employer] of Rheumatology: www.rheumatology.[place]: www.arthritis.org Contact a doctor if:  You have a flare.  You have a fever.  You have problems because of your medicines. Get help right away if:  You have chest pain.  You have trouble breathing.  You get a hot, painful joint all of a sudden, and it is worse than your normal joint aches. Summary  RA is a long-term disease.  Symptoms of RA start slowly. They are often worse in the morning.  RA causes inflammation in your joints. This information is not intended to replace advice given to you by your health  care provider. Make sure you discuss any questions you have with your health care provider. Document Released: 07/28/2011 Document Revised: 01/06/2018 Document Reviewed: 01/06/2018 Elsevier Interactive Patient Education  2019 [employer].

## 2018-07-27 NOTE — Progress Notes (Signed)
Office Visit Note  Patient: Donna [name]             Date of Birth: September 11, 1960           MRN: [id number]             PCP: Susy Frizzle, MD Referring: Susy Frizzle, MD Visit Date: 08/03/2018 Occupation: @GUAROCC @  Subjective:  Pain in multiple joints   History of Present Illness: Donna [name] is a 58 y.o. female with history of seropositive rheumatoid arthritis and osteoarthritis.  She reports she is having worsening joint pain and joint swelling since her initial visit on 07/27/18.  She reports she was previously taking 20-40 mg of prednisone daily PRN.  She is having increased pain in both wrist joint and hands.  She is having increased pain in both shoulder joints that has been waking her up at night.  She has joint pain in both knee joints, ankle joints, and both feet. She has pain in both hands, both knee joints, and the right ankle joint. 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 She is followed by gynonc at [facility].   Activities of Daily Living:  Patient reports stiffness all day.   Patient Reports nocturnal pain.  Difficulty dressing/grooming: Reports Difficulty climbing stairs: Reports Difficulty getting out of chair: Reports Difficulty using hands for taps, buttons, cutlery, and/or writing: Reports  Review of Systems  Constitutional: Positive for fatigue.  HENT: Negative for mouth sores, mouth dryness and nose dryness.   Eyes: Negative for pain, itching, visual disturbance and dryness.  Respiratory: Negative for cough, hemoptysis, shortness of breath, wheezing and difficulty breathing.   Cardiovascular: Negative for chest pain, palpitations, hypertension and swelling in legs/feet.  Gastrointestinal: Positive for nausea. Negative for abdominal pain, blood in stool, constipation and diarrhea.  Endocrine: Negative for increased urination.  Genitourinary: Negative for painful urination, nocturia and pelvic pain.  Musculoskeletal:  Positive for arthralgias, joint pain, joint swelling and morning stiffness. Negative for myalgias, muscle weakness, muscle tenderness and myalgias.  Skin: Negative for color change, pallor, rash, hair loss, nodules/bumps, redness, skin tightness, ulcers and sensitivity to sunlight.  Allergic/Immunologic: Negative for susceptible to infections.  Neurological: Negative for dizziness, light-headedness, numbness, headaches and memory loss.  Hematological: Negative for swollen glands.  Psychiatric/Behavioral: Positive for sleep disturbance. Negative for depressed mood and confusion. The patient is not nervous/anxious.     PMFS History:  Patient Active Problem List   Diagnosis Date Noted  . Nodule of vagina 02/03/2017  . Pulmonary nodule 02/03/2017  . Primary malignant neoplasm of ovary ([place]) 12/07/2014  . Hypothyroidism 12/03/2012  . HLD (hyperlipidemia) 12/03/2012  . OVERWEIGHT 06/22/2007  . NICOTINE ADDICTION 06/22/2007  . DEPRESSION 06/22/2007  . Essential hypertension 06/22/2007  . GERD 06/22/2007  . OSTEOARTHRITIS 06/22/2007    Past Medical History:  Diagnosis Date  . Anxiety   . Arthritis   . Cancer ([place])    ovarian, recurrent on chemo (carboplatin, doxorubicin) at [facility]  . Chest pain   . Colon polyp   . Constipation   . Depression   . Full dentures   . GERD (gastroesophageal reflux disease)   . Hemorrhoid   . HLD (hyperlipidemia) 12/03/2012  . Hyperlipidemia   . Hypertension   . Hypothyroidism   . IBS (irritable bowel syndrome)   . Neck mass   . Neuropathy    feet  . Numbness and tingling in hands   . Numbness in  both legs   . Palpitations   . Schatzki's ring   . Trouble swallowing   . Wears glasses     Family History  Problem Relation Age of Onset  . Heart attack Mother   . Hypertension Mother   . Diabetes Mother   . Heart disease Mother   . CVA Father   . Hypertension Father   . Diabetes Father   . Stroke Father   . Cancer Brother        lung cancer -  stage 4  . Cancer Brother        skin   . Heart disease Maternal Grandfather   . Heart attack Sister        CABG  . Diabetes Sister   . Stroke Sister   . Hypertension Sister   . Hyperlipidemia Sister   . Kidney disease Sister   . Stroke Sister   . Heart attack Sister    Past Surgical History:  Procedure Laterality Date  . ABDOMINAL HYSTERECTOMY    . ABLATION    . COLONOSCOPY    . CYST REMOVAL NECK  05/13/12  . EAR CYST EXCISION  05/13/2012   Procedure: CYST REMOVAL;  Surgeon: Ralene Ok, MD;  Location: [facility];  Service: General;  Laterality: Left;  excision of neck cyst  . THYROID SURGERY  10/2011 - approximate   ablation  . TONSILLECTOMY    . UPPER GI ENDOSCOPY     Social History   Social History Narrative  . Not on file   Immunization History  Administered Date(s) Administered  . Influenza,inj,Quad PF,6+ Mos 03/23/2015, 02/11/2016, 04/07/2017, 03/12/2018  . Pneumococcal Polysaccharide-23 11/04/2017     Objective: Vital Signs: BP (!) 145/84 (BP Location: Left Arm, Patient Position: Sitting, Cuff Size: Large)   Pulse 67   Resp 15   Ht 5' 7"  (1.702 m)   Wt 252 lb (114.3 kg)   BMI 39.47 kg/m    Physical Exam Vitals signs and nursing note reviewed.  Constitutional:      Appearance: She is well-developed.  HENT:     Head: Normocephalic and atraumatic.  Eyes:     Conjunctiva/sclera: Conjunctivae normal.  Neck:     Musculoskeletal: Normal range of motion.  Cardiovascular:     Rate and Rhythm: Normal rate and regular rhythm.     Heart sounds: Normal heart sounds.  Pulmonary:     Effort: Pulmonary effort is normal.     Breath sounds: Normal breath sounds.  Abdominal:     General: Bowel sounds are normal.     Palpations: Abdomen is soft.  Lymphadenopathy:     Cervical: No cervical adenopathy.  Skin:    General: Skin is warm and dry.     Capillary Refill: Capillary refill takes less than 2 seconds.  Neurological:     Mental Status: She is alert and  oriented to person, place, and time.  Psychiatric:        Behavior: Behavior normal.      Musculoskeletal Exam: C-spine, thoracic spine, and lumbar spine good ROM.  No midline spinal tenderness.  No SI joint tenderness.  Shoulder joints painful ROM bilaterally.  Elbow joints and wrist joints good ROM with no synovitis. Synovitis of bilateral 2nd and 3rd MCPs.  She has tenderness of all PIP joints.  Hip joints good ROM with no discomfort.  She has warmth and swelling of both knee joints.  She has tenderness and swelling of the right ankle joint.  No tenderness  or synovitis of left ankle joint.   CDAI Exam: CDAI Score: 33.6  Patient Global Assessment: 9 (mm); Provider Global Assessment: 7 (mm) Swollen: 17 ; Tender: 17  Joint Exam      Right  Left  MCP 2  Swollen Tender  Swollen Tender  MCP 3  Swollen Tender  Swollen Tender  IP  Swollen Tender  Swollen Tender  PIP 2  Swollen Tender  Swollen Tender  PIP 3  Swollen Tender  Swollen Tender  PIP 4  Swollen Tender  Swollen Tender  PIP 5  Swollen Tender  Swollen Tender  Knee  Swollen Tender  Swollen Tender  Ankle  Swollen Tender        Investigation: No additional findings.  Imaging: Dg Chest 2 View  Result Date: 07/27/2018 CLINICAL DATA:  Immunosuppressive therapy. EXAM: CHEST - 2 VIEW COMPARISON:  Chest x-ray dated May 10, 2012. FINDINGS: Unchanged mild cardiomegaly. Normal mediastinal contours. Normal pulmonary vascularity. Chronically coarsened interstitial markings are similar to prior studies, and likely smoking-related. No focal consolidation, pleural effusion, or pneumothorax. No acute osseous abnormality. IMPRESSION: No active cardiopulmonary disease. Electronically Signed   By: Titus Dubin M.D.   On: 07/27/2018 15:09   Xr Foot 2 Views Left  Result Date: 07/27/2018 Juxta-articular osteopenia was noted.  PIP and DIP narrowing was noted.  First MTP narrowing was noted.  No intertarsal joint space narrowing was noted.   Dorsal spurring and inferior calcaneal spur was noted. Impression: These findings are consistent with inflammatory arthritis and osteoarthritis overlap.  Xr Foot 2 Views Right  Result Date: 07/27/2018 First MTP narrowing PIP and DIP narrowing was noted.  Juxta-articular osteopenia was noted.  Possible cystic versus erosive changes noted in the first PIP joint.  No intertarsal joint space narrowing was noted.  Calcaneal spur was noted. Impression: These findings are consistent with osteoarthritis and inflammatory arthritis.  Xr Hand 2 View Left  Result Date: 07/27/2018 Juxta-articular osteopenia was noted.  Soft tissue swelling was noted.  Narrowing of PIP joints was noted.  CMC narrowing was noted.  No intercarpal or radiocarpal joint space narrowing was noted.  No erosive changes were noted. Impression: These findings are consistent with inflammatory arthritis and osteoarthritis overlap.  Xr Hand 2 View Right  Result Date: 07/27/2018 Juxta-articular osteopenia was noted.  Soft tissue swelling was noted.  PIP DIP and CMC narrowing was noted.  No intercarpal radiocarpal joint space narrowing was noted.  Possible erosion versus cystic change was noted over the radius. Impression: These findings are consistent with rheumatoid arthritis and osteoarthritis overlap.  Xr Knee 3 View Right  Result Date: 07/27/2018 Moderate medial compartment narrowing was noted.  No chondrocalcinosis was noted.  Effusion was noted.  Severe patellofemoral narrowing was noted. Impression: These findings are consistent with moderate osteoarthritis and inflammatory arthritis.   Recent Labs: Lab Results  Component Value Date   WBC 11.2 (H) 07/27/2018   HGB 13.6 07/27/2018   PLT 348 07/27/2018   NA 142 07/27/2018   K 4.9 07/27/2018   CL 102 07/27/2018   CO2 31 07/27/2018   GLUCOSE 87 07/27/2018   BUN 19 07/27/2018   CREATININE 0.82 07/27/2018   BILITOT 0.5 07/27/2018   ALKPHOS 89 06/30/2016   AST 12 07/27/2018    ALT 18 07/27/2018   PROT 6.9 07/27/2018   PROT 7.0 07/27/2018   ALBUMIN 4.1 06/30/2016   CALCIUM 10.0 07/27/2018   GFRAA 91 07/27/2018   QFTBGOLDPLUS NEGATIVE 07/27/2018  July 27, 2018 UA negative,  TB Gold negative, IFE negative   , immunoglobulins normal, hepatitis B-, CK normal, G6PD normal, anti-CCP> 250, 14 3 3  eta negative   , ESR 25  Speciality Comments: No specialty comments available.  Procedures:  No procedures performed Allergies: Paclitaxel   Assessment / Plan:     Visit Diagnoses: Rheumatoid arthritis involving multiple sites with positive rheumatoid factor (HCC) - Positive RF, positive anti-CCP with severe synovitis.  Patient was having pain and swelling in multiple joints.  She states she has been taking prednisone at different dosage and anywhere from 20 mg to 5 mg PRN.  We had detailed discussion regarding starting prednisone and 20 mg and taper by 5 mg every 2 weeks.  The plan was to start her on methotrexate today.  After having detailed discussion with the patient and also spoke with her nurse practitioner Ms. Addison Lank we decided to see the response to chemotherapy before giving her methotrexate.  I am also not sure if methotrexate is going to be compatible with her chemotherapy.  We would like feedback from her oncologist.  High risk medication use - Consent on methotrexate was obtained.  Patient is aware of all the symptoms side effects of the medication.  Effusion of both knee joints-she continues to have effusion in her both knees.  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  She was advised to's follow-up with her PCP.  History of ovarian cancer - Followed up at [facility].  Patient had chemotherapy x2 for recurrence.  She has another recurrence now.  Pulmonary nodule  Pure hypercholesterolemia  Anxiety and depression  History of  hypothyroidism  History of IBS  History of gastroesophageal reflux (GERD)   Orders: No orders of the defined types were placed in this encounter.  Meds ordered this encounter  Medications  . predniSONE (DELTASONE) 20 MG tablet    Sig: Take 1 tablet (20 mg total) by mouth daily with breakfast.    Dispense:  30 tablet    Refill:  0    Face-to-face time spent with patient was 35 minutes. Greater than 50% of time was spent in counseling and coordination of care.  Follow-Up Instructions: Return in about 3 months (around 11/03/2018) for Rheumatoid arthritis, Osteoarthritis.   Bo Merino, MD  Note - This record has been created using Editor, commissioning.  Chart creation errors have been sought, but may not always  have been located. Such creation errors do not reflect on  the standard of medical care.

## 2018-07-27 NOTE — Telephone Encounter (Signed)
Dose of methotrexate will be 15 mg subq weekly for 2 weeks then increase to 20 mg weekly if tolerated along with folic acid 2 mg daily. Prescription pending lab results, chest x-ray, and insurance approval.  She has commercial insurance so she is eligible for a co-pay card and was given one for Otrexup.  Patient has used vial and syringe insulin in the past and is comfortable using methotrexate vial and syringe.

## 2018-07-27 NOTE — Progress Notes (Signed)
Pharmacy Note  Subjective: Patient presents today to the [facility] to see Dr. Estanislado Pandy.  Patient seen by the pharmacist for counseling on methotrexate for rheumatoid arthritis. She is naive to therapy.  She currently has ovarian cancer and will need approval from oncologist prior to initiating a biologic if inadequate response to methotrexate.  Objective: CBC    Component Value Date/Time   WBC 9.6 06/02/2018 1548   RBC 4.71 06/02/2018 1548   HGB 13.4 06/02/2018 1548   HGB 14.0 06/17/2011 2049   HCT 39.3 06/02/2018 1548   HCT 40.8 06/17/2011 2049   PLT 394 06/02/2018 1548   PLT 354 06/17/2011 2049   MCV 83.4 06/02/2018 1548   MCV 87 06/17/2011 2049   MCH 28.5 06/02/2018 1548   MCHC 34.1 06/02/2018 1548   RDW 13.0 06/02/2018 1548   RDW 13.4 06/17/2011 2049   LYMPHSABS 1,392 06/02/2018 1548   MONOABS 140 (L) 06/30/2016 1628   EOSABS 163 06/02/2018 1548   BASOSABS 38 06/02/2018 1548    CMP     Component Value Date/Time   NA 141 06/02/2018 1548   NA 143 06/17/2011 2049   K 3.6 06/02/2018 1548   K 3.1 (L) 06/17/2011 2049   CL 101 06/02/2018 1548   CL 104 06/17/2011 2049   CO2 27 06/02/2018 1548   CO2 29 06/17/2011 2049   GLUCOSE 108 (H) 06/02/2018 1548   GLUCOSE 96 06/17/2011 2049   BUN 12 06/02/2018 1548   BUN 11 06/17/2011 2049   CREATININE 0.77 06/02/2018 1548   CALCIUM 9.5 06/02/2018 1548   CALCIUM 9.2 06/17/2011 2049   PROT 6.7 06/02/2018 1548   ALBUMIN 4.1 06/30/2016 1628   AST 14 06/02/2018 1548   ALT 12 06/02/2018 1548   ALKPHOS 89 06/30/2016 1628   BILITOT 0.3 06/02/2018 1548   GFRNONAA 85 06/02/2018 1548   GFRAA 98 06/02/2018 1548    Baseline Immunosuppressant Therapy Labs TB gold: pending 07/27/2018  Hepatitis Latest Ref Rng & Units 11/04/2017  Hep C Ab NON-REACTI NON-REACTIVE  Hep C Ab NON-REACTI NON-REACTIVE    Lab Results  Component Value Date   HIV NON-REACTIVE 11/04/2017   SPEP: pending 08/06/2018  Immunoglobulins:  pending 08/06/2018  Chest-xray:  Pending 07/27/2018  Contraception: hysterectomy  Alcohol use: a few times a year  Assessment/Plan:   Patient was counseled on the purpose, proper use, and adverse effects of methotrexate including nausea, infection, and signs and symptoms of pneumonitis. Discussed that there is the possibility of an increased risk of malignancy, specifically lymphomas, but it is not well understood if this increased risk is due to the medication or the disease state.  Instructed patient that medication should be held for infection and prior to surgery.  Advised patient to avoid live vaccines. Recommend annual influenza, Pneumovax 23, Prevnar 13, and Shingrix as indicated.   Reviewed instructions with patient to take methotrexate weekly along with folic acid daily.  Discussed the importance of frequent monitoring of kidney and liver function and blood counts, and provided patient with standing lab instructions.  Counseled patient to avoid NSAIDs and alcohol while on methotrexate.  Provided patient with educational materials on methotrexate and answered all questions.   Patient voiced understanding.  Patient consented to methotrexate use.  Will upload into chart.    Dose of methotrexate will be 15 mg subq weekly for 2 weeks then increase to 20 mg weekly if tolerated along with folic acid 2 mg daily. Prescription pending lab results, chest x-ray, and  insurance approval.  She has Pharmacist, community so she is eligible for a co-pay card and was given one for Otrexup.  Patient has used vial and syringe insulin in the past and is comfortable using methotrexate vial and syringe.  All questions encouraged and answered.  Instructed patient to call with any further questions or concerns.  Mariella Saa, PharmD, [facility] Clinical Pharmacist  07/27/2018 11:00 AM

## 2018-07-29 NOTE — Telephone Encounter (Signed)
Received a fax regarding Prior Authorization from [place] for [place]. Authorization has been DENIED because patient must be unable to use oral methotrexate or generic vial and syringe methotrexate .  Will send document to scan center.  Phone# [phone]

## 2018-07-31 LAB — CBC WITH DIFFERENTIAL/PLATELET
Absolute Monocytes: 683 cells/uL (ref 200–950)
Basophils Absolute: 34 cells/uL (ref 0–200)
Basophils Relative: 0.3 %
Eosinophils Absolute: 56 cells/uL (ref 15–500)
Eosinophils Relative: 0.5 %
HCT: 40.2 % (ref 35.0–45.0)
Hemoglobin: 13.6 g/dL (ref 11.7–15.5)
Lymphs Abs: 1814 cells/uL (ref 850–3900)
MCH: 28.6 pg (ref 27.0–33.0)
MCHC: 33.8 g/dL (ref 32.0–36.0)
MCV: 84.5 fL (ref 80.0–100.0)
MPV: 9.5 fL (ref 7.5–12.5)
Monocytes Relative: 6.1 %
Neutro Abs: 8613 cells/uL — ABNORMAL HIGH (ref 1500–7800)
Neutrophils Relative %: 76.9 %
Platelets: 348 10*3/uL (ref 140–400)
RBC: 4.76 10*6/uL (ref 3.80–5.10)
RDW: 14.2 % (ref 11.0–15.0)
Total Lymphocyte: 16.2 %
WBC: 11.2 10*3/uL — ABNORMAL HIGH (ref 3.8–10.8)

## 2018-07-31 LAB — URINALYSIS, ROUTINE W REFLEX MICROSCOPIC
Bilirubin Urine: NEGATIVE
Glucose, UA: NEGATIVE
Hgb urine dipstick: NEGATIVE
Ketones, ur: NEGATIVE
Leukocytes,Ua: NEGATIVE
Nitrite: NEGATIVE
Protein, ur: NEGATIVE
Specific Gravity, Urine: 1.025 (ref 1.001–1.03)
pH: <=5 (ref 5.0–8.0)

## 2018-07-31 LAB — COMPLETE METABOLIC PANEL WITH GFR
AG Ratio: 1.6 (calc) (ref 1.0–2.5)
ALT: 18 U/L (ref 6–29)
AST: 12 U/L (ref 10–35)
Albumin: 4.2 g/dL (ref 3.6–5.1)
Alkaline phosphatase (APISO): 94 U/L (ref 37–153)
BUN: 19 mg/dL (ref 7–25)
CO2: 31 mmol/L (ref 20–32)
Calcium: 10 mg/dL (ref 8.6–10.4)
Chloride: 102 mmol/L (ref 98–110)
Creat: 0.82 mg/dL (ref 0.50–1.05)
GFR, Est African American: 91 mL/min/{1.73_m2} (ref >=60)
GFR, Est Non African American: 78 mL/min/{1.73_m2} (ref >=60)
Globulin: 2.7 g/dL (calc) (ref 1.9–3.7)
Glucose, Bld: 87 mg/dL (ref 65–99)
Potassium: 4.9 mmol/L (ref 3.5–5.3)
Sodium: 142 mmol/L (ref 135–146)
Total Bilirubin: 0.5 mg/dL (ref 0.2–1.2)
Total Protein: 6.9 g/dL (ref 6.1–8.1)

## 2018-07-31 LAB — PROTEIN ELECTROPHORESIS, SERUM, WITH REFLEX
Albumin ELP: 4 g/dL (ref 3.8–4.8)
Alpha 1: 0.4 g/dL — ABNORMAL HIGH (ref 0.2–0.3)
Alpha 2: 1 g/dL — ABNORMAL HIGH (ref 0.5–0.9)
Beta 2: 0.4 g/dL (ref 0.2–0.5)
Beta Globulin: 0.4 g/dL (ref 0.4–0.6)
Gamma Globulin: 0.7 g/dL — ABNORMAL LOW (ref 0.8–1.7)
Total Protein: 7 g/dL (ref 6.1–8.1)

## 2018-07-31 LAB — IGG, IGA, IGM
IgG (Immunoglobin G), Serum: 779 mg/dL (ref 600–1640)
IgM, Serum: 134 mg/dL (ref 50–300)
Immunoglobulin A: 159 mg/dL (ref 47–310)

## 2018-07-31 LAB — IFE INTERPRETATION: Immunofix Electr Int: NOT DETECTED

## 2018-07-31 LAB — CYCLIC CITRUL PEPTIDE ANTIBODY, IGG: Cyclic Citrullin Peptide Ab: >250 UNITS — ABNORMAL HIGH

## 2018-07-31 LAB — HEPATITIS B CORE ANTIBODY, IGM: Hep B C IgM: NONREACTIVE

## 2018-07-31 LAB — QUANTIFERON-TB GOLD PLUS
Mitogen-NIL: >10 IU/mL
NIL: 0.14 IU/mL
QuantiFERON-TB Gold Plus: NEGATIVE
TB1-NIL: <0 IU/mL
TB2-NIL: <0 IU/mL

## 2018-07-31 LAB — CK: Total CK: 31 U/L (ref 29–143)

## 2018-07-31 LAB — 14-3-3 ETA PROTEIN: 14-3-3 eta Protein: <0.2 ng/mL (ref <0.2)

## 2018-07-31 LAB — GLUCOSE 6 PHOSPHATE DEHYDROGENASE: G-6PDH: 15.7 U/g Hgb (ref 7.0–20.5)

## 2018-07-31 LAB — SEDIMENTATION RATE: Sed Rate: 25 mm/h (ref 0–30)

## 2018-07-31 LAB — HEPATITIS B SURFACE ANTIGEN: Hepatitis B Surface Ag: NONREACTIVE

## 2018-08-02 ENCOUNTER — Telehealth: Payer: Self-pay | Admitting: *Deleted

## 2018-08-02 ENCOUNTER — Telehealth: Payer: Self-pay | Admitting: Rheumatology

## 2018-08-02 ENCOUNTER — Encounter: Payer: Self-pay | Admitting: Rheumatology

## 2018-08-02 MED ORDER — PREDNISONE 10 MG PO TABS: 10.0000 mg | ORAL_TABLET | Freq: Every day | ORAL | 0 refills | Status: DC

## 2018-08-02 NOTE — Telephone Encounter (Signed)
Left message for patient Donna [name] to return call in regards to patient.

## 2018-08-02 NOTE — Telephone Encounter (Signed)
Per Dr. Estanislado Pandy send in prescription for Prednisone 10 mg daily. Patient advised. Patient advised that she should not take Ibuprofen with Prednisone. Patient states she will come to the office tomorrow and discuss further.

## 2018-08-02 NOTE — Progress Notes (Signed)
Labs are consistent with rheumatoid arthritis.

## 2018-08-02 NOTE — Telephone Encounter (Signed)
[name] from [facility] left a voicemail stating Donna [name] is a mutual patient and requesting a return call.  Please call back at [phone] (direct line)

## 2018-08-02 NOTE — Telephone Encounter (Signed)
Patient sent a message through my chart. Patient states she is almost out of prednisone and have had to increase my ibuprofen because all the pain, swelling and stiffness is getting worse. Please let me know what I need to do. She has a new patient follow up appointment on 08/03/18 to discuss lab results and starting Methotrexate. Please advise.

## 2018-08-02 NOTE — Telephone Encounter (Signed)
Ok to give Pred

## 2018-08-03 ENCOUNTER — Encounter: Payer: Self-pay | Admitting: Rheumatology

## 2018-08-03 ENCOUNTER — Other Ambulatory Visit: Payer: Self-pay

## 2018-08-03 ENCOUNTER — Ambulatory Visit: Payer: Managed Care, Other (non HMO) | Admitting: Rheumatology

## 2018-08-03 VITALS — BP 145/84 | HR 67 | Resp 15 | Ht 67.0 in | Wt 252.0 lb

## 2018-08-03 DIAGNOSIS — M25461 Effusion, right knee: Secondary | ICD-10-CM | POA: Diagnosis not present

## 2018-08-03 DIAGNOSIS — M170 Bilateral primary osteoarthritis of knee: Secondary | ICD-10-CM | POA: Diagnosis not present

## 2018-08-03 DIAGNOSIS — M25462 Effusion, left knee: Secondary | ICD-10-CM

## 2018-08-03 DIAGNOSIS — Z79899 Other long term (current) drug therapy: Secondary | ICD-10-CM | POA: Diagnosis not present

## 2018-08-03 DIAGNOSIS — M0579 Rheumatoid arthritis with rheumatoid factor of multiple sites without organ or systems involvement: Secondary | ICD-10-CM | POA: Diagnosis not present

## 2018-08-03 DIAGNOSIS — E7800 Pure hypercholesterolemia, unspecified: Secondary | ICD-10-CM

## 2018-08-03 DIAGNOSIS — R911 Solitary pulmonary nodule: Secondary | ICD-10-CM

## 2018-08-03 DIAGNOSIS — Z8639 Personal history of other endocrine, nutritional and metabolic disease: Secondary | ICD-10-CM

## 2018-08-03 DIAGNOSIS — F329 Major depressive disorder, single episode, unspecified: Secondary | ICD-10-CM

## 2018-08-03 DIAGNOSIS — I10 Essential (primary) hypertension: Secondary | ICD-10-CM

## 2018-08-03 DIAGNOSIS — F419 Anxiety disorder, unspecified: Secondary | ICD-10-CM

## 2018-08-03 DIAGNOSIS — Z8543 Personal history of malignant neoplasm of ovary: Secondary | ICD-10-CM

## 2018-08-03 DIAGNOSIS — Z8719 Personal history of other diseases of the digestive system: Secondary | ICD-10-CM

## 2018-08-03 MED ORDER — PREDNISONE 20 MG PO TABS: 20.0000 mg | ORAL_TABLET | Freq: Every day | ORAL | 0 refills | Status: DC

## 2018-08-03 NOTE — Patient Instructions (Signed)
Take prednisone 20 mg every morning for 2 weeks Prednisone 15 mg every morning for 2 weeks Prednisone 10 mg every morning for 2 weeks Prednisone 5 mg every morning for 2 weeks Prednisone 2.5 mg every morning for 2 weeks and then discontinue.  Please contact us if you continue to have swelling despite being on chemotherapy.

## 2018-08-15 ENCOUNTER — Other Ambulatory Visit: Payer: Self-pay | Admitting: Family Medicine

## 2018-08-18 NOTE — Progress Notes (Deleted)
July 27, 2018 CBC WBC count 11.2, CMP normal, UA normal, IFE negative, immunoglobulins normal, TB Gold negative, hepatitis B-, G6PD normal, CK 31, ESR 25, _0 eta negative, anti-CCP> 250

## 2018-08-22 ENCOUNTER — Other Ambulatory Visit: Payer: Self-pay

## 2018-08-22 ENCOUNTER — Ambulatory Visit (HOSPITAL_COMMUNITY)
Admission: EM | Admit: 2018-08-22 | Discharge: 2018-08-22 | Disposition: A | Discharge status: Home / Self Care | Payer: Managed Care, Other (non HMO) | Attending: Family Medicine | Admitting: Family Medicine

## 2018-08-22 ENCOUNTER — Encounter (HOSPITAL_COMMUNITY): Payer: Self-pay | Admitting: Family Medicine

## 2018-08-22 DIAGNOSIS — L03113 Cellulitis of right upper limb: Secondary | ICD-10-CM | POA: Diagnosis not present

## 2018-08-22 DIAGNOSIS — Z23 Encounter for immunization: Secondary | ICD-10-CM

## 2018-08-22 DIAGNOSIS — Z9221 Personal history of antineoplastic chemotherapy: Secondary | ICD-10-CM

## 2018-08-22 DIAGNOSIS — W5501XA Bitten by cat, initial encounter: Secondary | ICD-10-CM | POA: Diagnosis not present

## 2018-08-22 DIAGNOSIS — M069 Rheumatoid arthritis, unspecified: Secondary | ICD-10-CM | POA: Diagnosis not present

## 2018-08-22 DIAGNOSIS — C569 Malignant neoplasm of unspecified ovary: Secondary | ICD-10-CM

## 2018-08-22 MED ORDER — TETANUS-DIPHTHERIA TOXOIDS TD 5-2 LFU IM INJ: 0.5000 mL | INJECTION | Freq: Once | INTRAMUSCULAR | Status: DC

## 2018-08-22 MED ORDER — AMOXICILLIN-POT CLAVULANATE 875-125 MG PO TABS: 1.0000 | ORAL_TABLET | Freq: Two times a day (BID) | ORAL | 0 refills | Status: DC

## 2018-08-22 MED ORDER — TETANUS-DIPHTH-ACELL PERTUSSIS 5-2.5-18.5 LF-MCG/0.5 IM SUSP
INTRAMUSCULAR | Status: AC
  Filled 2018-08-22: qty 0.5

## 2018-08-22 MED ORDER — TETANUS-DIPHTH-ACELL PERTUSSIS 5-2.5-18.5 LF-MCG/0.5 IM SUSP
0.5000 mL | Freq: Once | INTRAMUSCULAR | Status: AC
  Administered 2018-08-22: 0.5 mL via INTRAMUSCULAR

## 2018-08-22 NOTE — ED Provider Notes (Signed)
[facility]    CSN: [id number] Arrival date & time: 08/22/18  1114     History   Chief Complaint Chief Complaint  Patient presents with  . Animal Bite    HPI Donna [name] is a 58 y.o. female.   This is the initial [facility] urgent care clinic visit for this 58 year old woman who is complaining of right arm infection.  She has a history of ovarian cancer and is being treated with chemotherapy.  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  She was scratched by her cat on her right forearm 4 days ago and 2 days ago developed redness which is increasing in size.  She has had no fever but the rheumatoid arthritis has given her quite a bit of myalgia which is not significantly changed over the course of this recent cellulitis.  Last tetanus shot was January 2010.     Past Medical History:  Diagnosis Date  . Anxiety   . Arthritis   . Cancer ([place])    ovarian, recurrent on chemo (carboplatin, doxorubicin) at [facility]  . Chest pain   . Colon polyp   . Constipation   . Depression   . Full dentures   . GERD (gastroesophageal reflux disease)   . Hemorrhoid   . HLD (hyperlipidemia) 12/03/2012  . Hyperlipidemia   . Hypertension   . Hypothyroidism   . IBS (irritable bowel syndrome)   . Neck mass   . Neuropathy    feet  . Numbness and tingling in hands   . Numbness in both legs   . Palpitations   . Schatzki's ring   . Trouble swallowing   . Wears glasses     Patient Active Problem List   Diagnosis Date Noted  . Nodule of vagina 02/03/2017  . Pulmonary nodule 02/03/2017  . Primary malignant neoplasm of ovary ([place]) 12/07/2014  . Hypothyroidism 12/03/2012  . HLD (hyperlipidemia) 12/03/2012  . OVERWEIGHT 06/22/2007  . NICOTINE ADDICTION 06/22/2007  . DEPRESSION 06/22/2007  . Essential hypertension 06/22/2007  . GERD 06/22/2007  . OSTEOARTHRITIS 06/22/2007    Past Surgical  History:  Procedure Laterality Date  . ABDOMINAL HYSTERECTOMY    . ABLATION    . COLONOSCOPY    . CYST REMOVAL NECK  05/13/12  . EAR CYST EXCISION  05/13/2012   Procedure: CYST REMOVAL;  Surgeon: Ralene Ok, MD;  Location: [place];  Service: General;  Laterality: Left;  excision of neck cyst  . THYROID SURGERY  10/2011 - approximate   ablation  . TONSILLECTOMY    . UPPER GI ENDOSCOPY      OB History   No obstetric history on file.      Home Medications    Prior to Admission medications   Medication Sig Start Date End Date Taking? Authorizing Provider  amLODipine (NORVASC) 10 MG tablet Take 1 tablet (10 mg total) by mouth daily. 06/28/18   Susy Frizzle, MD  amoxicillin-clavulanate (AUGMENTIN) 875-125 MG tablet Take 1 tablet by mouth every 12 (twelve) hours. 08/22/18   Robyn Haber, MD  colchicine 0.6 MG tablet Take 1 tablet (0.6 mg total) by mouth 2 (two) times daily as needed. 06/04/18   Hilts, Legrand Como, MD  colchicine 0.6 MG tablet Take 1 tablet (0.6 mg total) by mouth 2 (two) times daily as needed. 06/16/18   Hilts, Legrand Como, MD  diphenhydrAMINE (BENADRYL) 25 MG tablet Take 25 mg by  mouth at bedtime as needed. For sleep    [provider]  etodolac (LODINE) 400 MG tablet Take 1 tablet (400 mg total) by mouth 2 (two) times daily as needed. 06/04/18   Hilts, Legrand Como, MD  famotidine (PEPCID) 20 MG tablet Take by mouth.    [provider]  glucosamine-chondroitin 500-400 MG tablet Take 1 tablet by mouth daily.    [provider]  hydrochlorothiazide (MICROZIDE) 12.5 MG capsule Take 1 capsule by mouth once daily 08/16/18   Susy Frizzle, MD  levothyroxine (SYNTHROID, LEVOTHROID) 150 MCG tablet Take 1 tablet (150 mcg total) by mouth daily. 11/13/17   Susy Frizzle, MD  potassium chloride SA (K-DUR,KLOR-CON) 20 MEQ tablet Take 1 tablet (20 mEq total) by mouth daily. 11/27/17 09/05/20  Susy Frizzle, MD  predniSONE (DELTASONE) 10 MG tablet Take 1  tablet (10 mg total) by mouth daily with breakfast. 08/02/18   Bo Merino, MD  predniSONE (DELTASONE) 20 MG tablet Take 1 tablet (20 mg total) by mouth daily with breakfast. 08/03/18   Ofilia Neas, PA-C  simvastatin (ZOCOR) 20 MG tablet TAKE 1 TABLET BY MOUTH AT BEDTIME 08/16/18   Susy Frizzle, MD  vitamin B-12 (CYANOCOBALAMIN) 1000 MCG tablet Take 1,000 mcg by mouth daily.    [provider]    Family History Family History  Problem Relation Age of Onset  . Heart attack Mother   . Hypertension Mother   . Diabetes Mother   . Heart disease Mother   . CVA Father   . Hypertension Father   . Diabetes Father   . Stroke Father   . Cancer Brother        lung cancer - stage 4  . Cancer Brother        skin   . Heart disease Maternal Grandfather   . Heart attack Sister        CABG  . Diabetes Sister   . Stroke Sister   . Hypertension Sister   . Hyperlipidemia Sister   . Kidney disease Sister   . Stroke Sister   . Heart attack Sister     Social History Social History   Tobacco Use  . Smoking status: Former Smoker    Years: 32.00    Last attempt to quit: 06/19/2011    Years since quitting: 7.1  . Smokeless tobacco: Never Used  Substance Use Topics  . Alcohol use: No  . Drug use: No     Allergies   Paclitaxel   Review of Systems Review of Systems   Physical Exam Triage Vital Signs ED Triage Vitals  Enc Vitals Group     BP      Pulse      Resp      Temp      Temp src      SpO2      Weight      Height      Head Circumference      Peak Flow      Pain Score      Pain Loc      Pain Edu?      Excl. in [place]?    No data found.  Updated Vital Signs BP (!) 171/83 (BP Location: Left Arm)   Pulse 74   Temp 98.6 F (37 C) (Oral)   Resp 18   SpO2 100%    Physical Exam Vitals signs and nursing note reviewed.  Constitutional:      General:  She is not in acute distress.    Appearance: Normal appearance. She is obese. She is not  ill-appearing.  HENT:     Head: Normocephalic.     Mouth/Throat:     Mouth: Mucous membranes are moist.  Eyes:     Conjunctiva/sclera: Conjunctivae normal.  Neck:     Musculoskeletal: Normal range of motion and neck supple.  Musculoskeletal: Normal range of motion.  Skin:    General: Skin is warm and dry.     Findings: Rash present.  Neurological:     General: No focal deficit present.     Mental Status: She is alert.  Psychiatric:        Mood and Affect: Mood normal.        UC Treatments / Results  Labs (all labs ordered are listed, but only abnormal results are displayed) Labs Reviewed - No data to display  EKG None  Radiology No results found.  Procedures Procedures (including critical care time)  Medications Ordered in UC Medications  tetanus & diphtheria toxoids (adult) (TENIVAC) injection 0.5 mL (has no administration in time range)    Initial Impression / Assessment and Plan / UC Course  I have reviewed the triage vital signs and the nursing notes.  Pertinent labs & imaging results that were available during my care of the patient were reviewed by me and considered in my medical decision making (see chart for details).    Final Clinical Impressions(s) / UC Diagnoses   Final diagnoses:  Cat bite, initial encounter  Cellulitis of right arm   Discharge Instructions   None    ED Prescriptions    Medication Sig Dispense Auth. Provider   amoxicillin-clavulanate (AUGMENTIN) 875-125 MG tablet Take 1 tablet by mouth every 12 (twelve) hours. 14 tablet Robyn Haber, MD     Controlled Substance Prescriptions [place] Controlled Substance Registry consulted? Not Applicable   Robyn Haber, MD 08/22/18 1204

## 2018-08-22 NOTE — ED Triage Notes (Signed)
The patient presented to the [facility] with a complaint of a cat bite to the right forearm that occurred 5 days ago. The patient reported increased redness and swelling and both were noted. The patient reported that the cat was her animal and was up to date on vaccinations.

## 2018-08-23 ENCOUNTER — Telehealth: Payer: Self-pay | Admitting: Rheumatology

## 2018-08-23 ENCOUNTER — Ambulatory Visit: Payer: Managed Care, Other (non HMO) | Admitting: Rheumatology

## 2018-08-23 NOTE — Telephone Encounter (Signed)
Patient called to let Dr. Estanislado Pandy know that she had to go to Urgent Care with an infection in her arm from her cat scratching her last week.  Patient states she was given antibiotics.

## 2018-08-24 ENCOUNTER — Encounter: Payer: Self-pay | Admitting: Rheumatology

## 2018-08-24 NOTE — Telephone Encounter (Signed)
Attempted to contact the patient and left message for patient to call the office.  

## 2018-08-24 NOTE — Telephone Encounter (Signed)
Patient states she was given an antibiotics for a cat bite. Patient is not currently taking colchicine. Patient is currently on Prednisone 20 mg daily. Patient states she is having trouble walking. Patient states around 08/15/18. Patient states she is having extreme fatigue. Patient states she had chemo on 08/10/18. Patient states she thought the fatigue was due to the chemo. Patient states that she reached out to her oncologist. Patient states they advised her to call our office. Patient states she is only able to walk about 6-7 minutes without taking a break. Patient states the fatigue is in her legs accompanied by cramping. Patient states the cramping is also in her right foot including her toes. Patient states the cramping in her foot started on 08/21/18. Patient would like to discuss increasing her prednisone to help with this. Please advise.

## 2018-08-24 NOTE — Progress Notes (Signed)
Virtual Visit via Telephone Note  I connected with Donna [name] on 08/24/18 at  8:45 AM EDT by telephone and verified that I am speaking with the correct person using two identifiers.   I discussed the limitations, risks, security and privacy concerns of performing an evaluation and management service by telephone and the availability of in person appointments. I also discussed with the patient that there may be a patient responsible charge related to this service. The patient expressed understanding and agreed to proceed.  CC: Right foot pain   History of Present Illness: Patient is a 58 year old female with a past medical history of seropositive rheumatoid arthritis and osteoarthritis.  She is having severe right foot pain that started 1 week ago.  She is unable to bear full weight due to the pain.  She feels it is due to poor circulation and neuropathy related to chemotherapy.  She describes the pain as a burning sensation and pins/needles. She is having coolness in her right foot that goes up to her mid-shin. She states she could not tolerate taking gabapentin in the past.  She has joint stiffness in both knee joints but denies any other joint pain or joint swelling.  According to the patient she is unable to get in touch with her oncologist.  She reports 2 weeks ago they sent in tramadol for pain relief, which was not effective.  She states gabapentin in the past made her "loopy" but she would like a refill today. She was tearful on the phone today.  She is concerned about the coordination of care between [facility] and [place].  Review of Systems  Constitutional: Positive for malaise/fatigue. Negative for fever.  Eyes: Negative for photophobia, pain, discharge and redness.  Respiratory: Negative for cough, shortness of breath and wheezing.   Cardiovascular: Negative for chest pain and palpitations.  Gastrointestinal: Negative for blood in stool, constipation and diarrhea.   Genitourinary: Negative for dysuria.  Musculoskeletal: Positive for joint pain. Negative for back pain, myalgias and neck pain.  Skin: Negative for rash.  Neurological: Negative for dizziness and headaches.  Psychiatric/Behavioral: Negative for depression. The patient is not nervous/anxious and does not have insomnia.       Observations/Objective: Physical Exam  Constitutional: She is oriented to person, place, and time.  Neurological: She is alert and oriented to person, place, and time.  Psychiatric: Mood, memory, affect and judgment normal.   Patient reports morning stiffness for 2 hours.   Patient reports nocturnal pain.  Difficulty dressing/grooming: Denies Difficulty climbing stairs: Reports Difficulty getting out of chair: Reports Difficulty using hands for taps, buttons, cutlery, and/or writing: Denies  Findings:  July 27, 2018 CBC WBC 11.2, CMP normal, UA negative, CK 31, TB Gold negative, hepatitis B negative, IFE negative, G6PD normal, immunoglobulins normal, ESR 25, anti-CCP> 250, _0 eta negative  Assessment and Plan: Rheumatoid arthritis involving multiple sites with positive rheumatoid factor (HCC) - Positive RF, positive anti-CCP with severe synovitis.  After having detailed discussion with the patient and speaking with her nurse practitioner Ms. Addison Lank we decided to see the response to chemotherapy before giving her methotrexate.  She has noticed improvement in joint pain and joint swelling since starting on chemotherapy.  We will refer the patient to [facility] rheumatology to assist in better coordination care with her [place] oncology team.  High risk medication use - She is undergoing chemotherapy at [facility] cancer center.   Pain in right foot: She has been experiencing  severe right foot pain for the past 1 week that has progressively been worsening.  She denies any joint swelling or discoloration.  She is unable to bear full weight due to the discomfort.  She has  been using a wheelchair at times for assistance.  She describes the pain as a burning and pins/needles sensation.  She states she has a cool sensation in her foot and halfway up her right shin. A prescription for tramadol was prescribed by her oncologist 2 weeks ago but the patient states it has not been effective at controlling her pain.  She requested a prescription of gabapentin which will be send to the pharmacy.  She was advised to follow up with her oncologist urgently, and if she develops new or worsening symptoms to be seen in the ED.    Effusion of both knee joints-She is having stiffness in both knee joints.  She denies any effusions or warmth.    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    History of ovarian cancer - Followed up at [facility].  Patient had chemotherapy x2 for recurrence.  She is undergoing chemotherapy.   Follow Up Instructions: She will follow up with [place] in September if not evaluated by [facility] rheumatology.  Prescription for gabapentin 300 mg 1 capsule by mouth at bedtime will be sent to the pharmacy.     I discussed the assessment and treatment plan with the patient. The patient was provided an opportunity to ask questions and all were answered. The patient agreed with the plan and demonstrated an understanding of the instructions.   The patient was advised to call back or seek an in-person evaluation if the symptoms worsen or if the condition fails to improve as anticipated.  I provided 25 minutes of non-face-to-face time during this encounter.  Bo Merino, MD   Scribed by-  Ofilia Neas, PA-C

## 2018-08-24 NOTE — Telephone Encounter (Signed)
I reviewed patient's message in the [place].  I am uncertain of the fatigue and the leg cramps are coming from the chemotherapy or rheumatoid arthritis.  Patient states she has been having increased pain in her feet.  She is currently on prednisone 20 mg p.o. daily.  I have advised her to increase prednisone to 40 mg p.o. daily for 2 days and then decrease it to 30 mg p.o. daily for 2 days then she can resume 20 mg p.o. daily.  She was also interested in starting on methotrexate.  I am uncertain if she can take methotrexate while she is receiving chemotherapy.  I discussed we will get permission from her oncologist Dr. Fransisca Connors if methotrexate can be used along with her chemotherapy.  I assume with ongoing chemotherapy her rheumatoid arthritis should improve.  Please forward this note to Dr. Fransisca Connors at [facility] oncology.

## 2018-08-24 NOTE — Progress Notes (Signed)
July 27, 2018 CBC WBC 11.2, CMP normal, UA negative, CK 31, TB Gold negative, hepatitis B negative, IFE negative, G6PD normal, immunoglobulins normal, ESR 25, anti-CCP> 250, 14 3 3  eta negative

## 2018-08-31 ENCOUNTER — Encounter: Payer: Self-pay | Admitting: Rheumatology

## 2018-08-31 ENCOUNTER — Telehealth (INDEPENDENT_AMBULATORY_CARE_PROVIDER_SITE_OTHER): Payer: Managed Care, Other (non HMO) | Admitting: Rheumatology

## 2018-08-31 DIAGNOSIS — E7800 Pure hypercholesterolemia, unspecified: Secondary | ICD-10-CM

## 2018-08-31 DIAGNOSIS — M0579 Rheumatoid arthritis with rheumatoid factor of multiple sites without organ or systems involvement: Secondary | ICD-10-CM

## 2018-08-31 DIAGNOSIS — C569 Malignant neoplasm of unspecified ovary: Secondary | ICD-10-CM

## 2018-08-31 DIAGNOSIS — M170 Bilateral primary osteoarthritis of knee: Secondary | ICD-10-CM

## 2018-08-31 DIAGNOSIS — M25461 Effusion, right knee: Secondary | ICD-10-CM | POA: Diagnosis not present

## 2018-08-31 DIAGNOSIS — F329 Major depressive disorder, single episode, unspecified: Secondary | ICD-10-CM

## 2018-08-31 DIAGNOSIS — Z8543 Personal history of malignant neoplasm of ovary: Secondary | ICD-10-CM

## 2018-08-31 DIAGNOSIS — Z8639 Personal history of other endocrine, nutritional and metabolic disease: Secondary | ICD-10-CM

## 2018-08-31 DIAGNOSIS — I10 Essential (primary) hypertension: Secondary | ICD-10-CM

## 2018-08-31 DIAGNOSIS — M25462 Effusion, left knee: Secondary | ICD-10-CM

## 2018-08-31 DIAGNOSIS — Z8719 Personal history of other diseases of the digestive system: Secondary | ICD-10-CM

## 2018-08-31 DIAGNOSIS — M79671 Pain in right foot: Secondary | ICD-10-CM

## 2018-08-31 DIAGNOSIS — R911 Solitary pulmonary nodule: Secondary | ICD-10-CM

## 2018-08-31 DIAGNOSIS — Z79899 Other long term (current) drug therapy: Secondary | ICD-10-CM

## 2018-08-31 DIAGNOSIS — F419 Anxiety disorder, unspecified: Secondary | ICD-10-CM

## 2018-08-31 DIAGNOSIS — Z86010 Personal history of colonic polyps: Secondary | ICD-10-CM

## 2018-08-31 MED ORDER — GABAPENTIN 300 MG PO CAPS: 300.0000 mg | ORAL_CAPSULE | Freq: Every day | ORAL | 0 refills | Status: DC

## 2018-09-02 ENCOUNTER — Telehealth: Payer: Self-pay | Admitting: Rheumatology

## 2018-09-02 NOTE — Telephone Encounter (Signed)
-----   Message from [place] sent at 09/01/2018  9:54 AM EDT ----- Patient had virtual visit yesterday with Dr.Deveshwar. Please call to schedule follow up for September 2020. Thanks!

## 2018-09-02 NOTE — Telephone Encounter (Signed)
I tried to contact patient to schedule next follow up appt. Patient did not answer, and voicemail was full. I was not able to leave a message.

## 2018-09-07 ENCOUNTER — Telehealth: Payer: Self-pay | Admitting: Rheumatology

## 2018-09-07 MED ORDER — WARFARIN SODIUM 5 MG PO TABS
5.00 | ORAL_TABLET | ORAL | Status: DC
Start: 2018-09-08 — End: 2018-09-07

## 2018-09-07 MED ORDER — GENERIC EXTERNAL MEDICATION
150.00 | Status: DC
Start: 2018-09-08 — End: 2018-09-07

## 2018-09-07 MED ORDER — PREDNISONE 5 MG PO TABS
20.00 | ORAL_TABLET | ORAL | Status: DC
Start: 2018-09-08 — End: 2018-09-07

## 2018-09-07 MED ORDER — GABAPENTIN 100 MG PO CAPS
100.00 | ORAL_CAPSULE | ORAL | Status: DC
Start: 2018-09-07 — End: 2018-09-07

## 2018-09-07 MED ORDER — OXYCODONE HCL 5 MG PO TABS
5.00 | ORAL_TABLET | ORAL | Status: DC
Start: ? — End: 2018-09-07

## 2018-09-07 MED ORDER — HYDROCHLOROTHIAZIDE 25 MG PO TABS
12.50 | ORAL_TABLET | ORAL | Status: DC
Start: 2018-09-08 — End: 2018-09-07

## 2018-09-07 MED ORDER — ACETAMINOPHEN 325 MG PO TABS
650.00 | ORAL_TABLET | ORAL | Status: DC
Start: 2018-09-07 — End: 2018-09-07

## 2018-09-07 MED ORDER — SENNOSIDES-DOCUSATE SODIUM 8.6-50 MG PO TABS
2.00 | ORAL_TABLET | ORAL | Status: DC
Start: 2018-09-07 — End: 2018-09-07

## 2018-09-07 MED ORDER — PANTOPRAZOLE SODIUM 40 MG PO TBEC
40.00 | DELAYED_RELEASE_TABLET | ORAL | Status: DC
Start: ? — End: 2018-09-07

## 2018-09-07 MED ORDER — ENOXAPARIN SODIUM 120 MG/0.8ML ~~LOC~~ SOLN
120.00 | SUBCUTANEOUS | Status: DC
Start: 2018-09-07 — End: 2018-09-07

## 2018-09-07 MED ORDER — AMLODIPINE BESYLATE 10 MG PO TABS
10.00 | ORAL_TABLET | ORAL | Status: DC
Start: 2018-09-08 — End: 2018-09-07

## 2018-09-07 MED ORDER — POLYETHYLENE GLYCOL 3350 17 G PO PACK
17.00 | PACK | ORAL | Status: DC
Start: 2018-09-08 — End: 2018-09-07

## 2018-09-07 MED ORDER — SIMVASTATIN 20 MG PO TABS
20.00 | ORAL_TABLET | ORAL | Status: DC
Start: 2018-09-07 — End: 2018-09-07

## 2018-09-07 MED ORDER — ONDANSETRON HCL 4 MG/2ML IJ SOLN
4.00 | INTRAMUSCULAR | Status: DC
Start: ? — End: 2018-09-07

## 2018-09-07 MED ORDER — HYDROMORPHONE HCL 1 MG/ML IJ SOLN
.50 | INTRAMUSCULAR | Status: DC
Start: ? — End: 2018-09-07

## 2018-09-07 NOTE — Telephone Encounter (Signed)
-----   Message from [place] sent at 09/01/2018  9:54 AM EDT ----- Patient had virtual visit yesterday with Dr.Deveshwar. Please call to schedule follow up for September 2020. Thanks!

## 2018-09-07 NOTE — Telephone Encounter (Signed)
I spoke with patient in reference to scheduling a rov for 6 months. Per patient, Dr. Estanislado Pandy is referring her to rheumatologist at [facility], so she will not need a rov.

## 2018-09-09 ENCOUNTER — Ambulatory Visit (INDEPENDENT_AMBULATORY_CARE_PROVIDER_SITE_OTHER): Payer: Managed Care, Other (non HMO) | Admitting: Family Medicine

## 2018-09-09 ENCOUNTER — Other Ambulatory Visit: Payer: Self-pay

## 2018-09-09 DIAGNOSIS — I82463 Acute embolism and thrombosis of calf muscular vein, bilateral: Secondary | ICD-10-CM

## 2018-09-09 LAB — PT WITH INR/FINGERSTICK
INR, fingerstick: 1.3 ratio — ABNORMAL HIGH
PT, fingerstick: 15.5 s — ABNORMAL HIGH (ref 10.5–13.1)

## 2018-09-09 NOTE — Progress Notes (Signed)
Subjective:    Patient ID: Donna [name], female    DOB: 1960/10/26, 58 y.o.   MRN: [id number]  HPI  Patient was originally scheduled to be seen today in the office for hospital follow-up.  However on intake, she explains that she has been exposed to a Dietitian who has had COVID-19.  She was notified of this by [facility] after she had left the hospital.  Therefore we asked the patient to go back out to the parking lot so that I could see her outside.  The laboratory technician for appropriate personal protective equipment.  The point to today's visit was to recheck her INR.  She was discharged from the hospital on Sunday on Coumadin 5 mg tablets daily.  Today is day 4 and her INR as expected has not risen.  It is only 1.3 however she is only been on Coumadin for 4 days.  She is also taking Lovenox 120 mg subcu twice daily.  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  I have no hospital records to review.  She is also told that her cancer has returned.  This is the most likely cause of her DVT.  Due to the patient's quarantine for COVID exposure, I was unable to perform a physical exam.  We also did not take vital signs as we attempted to minimize contact. Past Medical History:  Diagnosis Date  . Anxiety   . Arthritis   . Cancer ([place])    ovarian, recurrent on chemo (carboplatin, doxorubicin) at [facility]  . Chest pain   . Colon polyp   . Constipation   . Depression   . Full dentures   . GERD (gastroesophageal reflux disease)   . Hemorrhoid   . HLD (hyperlipidemia) 12/03/2012  . Hyperlipidemia   . Hypertension   . Hypothyroidism   . IBS (irritable bowel syndrome)   . Neck mass   . Neuropathy    feet  . Numbness and tingling in hands   . Numbness in both legs   . Palpitations   . Schatzki's ring   . Trouble swallowing   . Wears glasses    Past Surgical History:  Procedure Laterality Date  . ABDOMINAL HYSTERECTOMY    . ABLATION    . COLONOSCOPY     . CYST REMOVAL NECK  05/13/12  . EAR CYST EXCISION  05/13/2012   Procedure: CYST REMOVAL;  Surgeon: Ralene Ok, MD;  Location: [place];  Service: General;  Laterality: Left;  excision of neck cyst  . THYROID SURGERY  10/2011 - approximate   ablation  . TONSILLECTOMY    . UPPER GI ENDOSCOPY     Current Outpatient Medications on File Prior to Visit  Medication Sig Dispense Refill  . amLODipine (NORVASC) 10 MG tablet Take 1 tablet (10 mg total) by mouth daily. 90 tablet 0  . amoxicillin-clavulanate (AUGMENTIN) 875-125 MG tablet Take 1 tablet by mouth every 12 (twelve) hours. 14 tablet 0  . colchicine 0.6 MG tablet Take 1 tablet (0.6 mg total) by mouth 2 (two) times daily as needed. 30 tablet 3  . colchicine 0.6 MG tablet Take 1 tablet (0.6 mg total) by mouth 2 (two) times daily as needed. 60 tablet 6  . diphenhydrAMINE (BENADRYL) 25 MG tablet Take 25 mg by mouth at bedtime as needed. For sleep    . etodolac (LODINE) 400 MG tablet Take 1 tablet (400 mg total) by mouth 2 (two) times daily as  needed. 60 tablet 3  . famotidine (PEPCID) 20 MG tablet Take by mouth.    . gabapentin (NEURONTIN) 300 MG capsule Take 1 capsule (300 mg total) by mouth at bedtime. 30 capsule 0  . glucosamine-chondroitin 500-400 MG tablet Take 1 tablet by mouth daily.    . hydrochlorothiazide (MICROZIDE) 12.5 MG capsule Take 1 capsule by mouth once daily 90 capsule 0  . levothyroxine (SYNTHROID, LEVOTHROID) 150 MCG tablet Take 1 tablet (150 mcg total) by mouth daily. 90 tablet 3  . potassium chloride SA (K-DUR,KLOR-CON) 20 MEQ tablet Take 1 tablet (20 mEq total) by mouth daily. 90 tablet 3  . predniSONE (DELTASONE) 10 MG tablet Take 1 tablet (10 mg total) by mouth daily with breakfast. 30 tablet 0  . predniSONE (DELTASONE) 20 MG tablet Take 1 tablet (20 mg total) by mouth daily with breakfast. 30 tablet 0  . simvastatin (ZOCOR) 20 MG tablet TAKE 1 TABLET BY MOUTH AT BEDTIME 90 tablet 0  . vitamin B-12 (CYANOCOBALAMIN)  1000 MCG tablet Take 1,000 mcg by mouth daily.     No current facility-administered medications on file prior to visit.    Allergies  Allergen Reactions  . Paclitaxel Other (See Comments)    Flushing, Itching, Throat tightness   Social History   Socioeconomic History  . Marital status: Single    Spouse name: Not on file  . Number of children: Not on file  . Years of education: Not on file  . Highest education level: Not on file  Occupational History  . Not on file  Social Needs  . Financial resource strain: Not on file  . Food insecurity:    Worry: Not on file    Inability: Not on file  . Transportation needs:    Medical: Not on file    Non-medical: Not on file  Tobacco Use  . Smoking status: Former Smoker    Years: 32.00    Last attempt to quit: 06/19/2011    Years since quitting: 7.2  . Smokeless tobacco: Never Used  Substance and Sexual Activity  . Alcohol use: No  . Drug use: No  . Sexual activity: Yes  Lifestyle  . Physical activity:    Days per week: Not on file    Minutes per session: Not on file  . Stress: Not on file  Relationships  . Social connections:    Talks on phone: Not on file    Gets together: Not on file    Attends religious service: Not on file    Active member of club or organization: Not on file    Attends meetings of clubs or organizations: Not on file    Relationship status: Not on file  . Intimate partner violence:    Fear of current or ex partner: Not on file    Emotionally abused: Not on file    Physically abused: Not on file    Forced sexual activity: Not on file  Other Topics Concern  . Not on file  Social History Narrative  . Not on file     Review of Systems  All other systems reviewed and are negative.      Objective:   Physical Exam  No physical exam was performed today as the patient was seen face-to-face in the parking lot due to her COVID-19 quarantine.  INR was found to be 1.3.  Patient is sitting comfortably in  her car.  She is in no respiratory distress.  She denies any chest pain.  She denies any dyspnea.  She is very confused as to what is going on and why she is taking the medication she is taking     Assessment & Plan:  Acute deep vein thrombosis (DVT) of calf muscle vein of both lower extremities - Plan: PT with INR/Fingerstick  Patient states that she has DVTs in both legs.  I have no records to review.  INR is subtherapeutic.  I will increase her Coumadin to 7.5 mg daily.  She will take this today, Friday, Saturday, Sunday and we will see the patient back on Monday at 9 AM and recheck her INR.  We will discontinue Lovenox once INR is therapeutic.

## 2018-09-09 NOTE — Addendum Note (Signed)
Addended by: Shary Decamp B on: 09/09/2018 10:15 AM   Modules accepted: Orders

## 2018-09-13 ENCOUNTER — Other Ambulatory Visit: Payer: Self-pay

## 2018-09-13 ENCOUNTER — Ambulatory Visit (INDEPENDENT_AMBULATORY_CARE_PROVIDER_SITE_OTHER): Payer: Managed Care, Other (non HMO) | Admitting: Family Medicine

## 2018-09-13 ENCOUNTER — Encounter: Payer: Self-pay | Admitting: Family Medicine

## 2018-09-13 DIAGNOSIS — I749 Embolism and thrombosis of unspecified artery: Secondary | ICD-10-CM

## 2018-09-13 DIAGNOSIS — I743 Embolism and thrombosis of arteries of the lower extremities: Secondary | ICD-10-CM

## 2018-09-13 LAB — PT WITH INR/FINGERSTICK
INR, fingerstick: 2 ratio — ABNORMAL HIGH
PT, fingerstick: 23.6 s — ABNORMAL HIGH (ref 10.5–13.1)

## 2018-09-13 NOTE — Progress Notes (Signed)
Subjective:    Patient ID: Donna [name], female    DOB: [birth year], 58 y.o.   MRN: [id number]  HPI 09/09/18 Patient was originally scheduled to be seen today in the office for hospital follow-up.  However on intake, she explains that she has been exposed to a Dietitian who has had COVID-19.  She was notified of this by [facility] after she had left the hospital.  Therefore we asked the patient to go back out to the parking lot so that I could see her outside.  The laboratory technician wore appropriate personal protective equipment.  The point to today's visit was to recheck her INR.  She was discharged from the hospital on Sunday on Coumadin 5 mg tablets daily.  Today is day 4 and her INR as expected has not risen.  It is only 1.3 however she is only been on Coumadin for 4 days.  She is also taking Lovenox 120 mg subcu twice daily.  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  I have no hospital records to review.  She is also told that her cancer has returned.  This is the most likely cause of her DVT.  Due to the patient's quarantine for COVID exposure, I was unable to perform a physical exam.  We also did not take vital signs as we attempted to minimize contact.  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  I have no records to review.  INR is subtherapeutic.  I will increase her Coumadin to 7.5 mg daily.  She will take this today, Friday, Saturday, Sunday and we will see the patient back on Monday at 9 AM and recheck her INR.  We will discontinue Lovenox once INR is therapeutic.  09/13/18 Here today for follow up.  Has been on Coumadin 7.5 mg daily Friday, Saturday, Sunday.  I was able to locate her hospital records from April 14.  I have copied these records and included them below for my reference:  Surgeries Performed: RLE angiogram with SFA, popliteal artery,and tibial artery thrombectomies as well as VBX stent placement in  aorta   Brief History of Present Illness: Donna [name] a 20 y.o.femalewith ovarian cancer (on chemo, last dose of taxol 3/24), RA (on 20mg  Prednisone daily) HTN, HLD, and hypothyroid who presents with right foot pain and coolness.   Patient states she has been having intermittent right foot painfor about 7 to 10 days.However, the pain has progressively worsened over the last 1 to 2 days and is now constant and severe. She states that on the morning of presentation (4/14) she noted her right foot to be cooler than the left foot, which prompted her to seek medical attention at the cancer clinic. At the cancer clinic she reports having sensation of pins-and-needles. She has tried Tylenol and Advil with limited relief heat helps a little bit. She denies any skin rash or redness however notes cooler right lower extremity from about the shin mid shin down. She denies fevers, chills, headaches, changes in vision, loss of consciousness, lightheadedness, dizziness, chest pain, or shortness of breath.  In the ED they were unable to palpate a pulse or obtain a doppler signals in the right lower extremity prompting vascular consultation. Patient remains hemodynamically appropriate and afebrile.  Hospital Course: As above, [name] presented on 08/31/2018. She was started on a heparin drip and she was optimized for surgery. As part of the  pre-op workup, a COVID screening was performed, and she was found to be negative. On 09/02/2018, she was taken to the operating room with Dr. Tobie Poet and Dr. Jimmye Norman for a RLE angiogram with placement of aortic stent and thrombectomies of the SFA, popliteal and tibial arteries. The procedure was without complication. Estimated blood loss was 163mL. Hemostasis was achieved prior to conclusion of the case. She remained stable throughout the operation and was transferred to the PACU and subsequently to a surgical stepdown unit for continued care. Post-operatively, they were  examined routinely by floor nursing and by the vascular surgery team. Heparin drip was resumed in the immediate post-op period, however, following surgery, the hematology team was consulted and recommendation for anticoagulation therapy to include Warfarin with a Lovenox bridge was made. She was transitioned from heparin drip to Warfarin and Lovenox on 09/06/2018.   During their hospitalization, she worked with physical therapy. They were cleared on 09/05/2018, with recommendation for rolling walker. Case management worked to facilitate delivery of the rolling walker prior to discharge.  Per patient request, a referral to establish care with [facility] rheumatology was made. [name] stated that she wished to transfer care from [facility] health to [facility] for management of her previously diagnosed rheumatoid arthritis.   On 09/05/2018, out of an abundance of precaution, she was placed on contact and droplet isolation due to positive diagnoses among some of the unit nursing staff. [name] was tested for Covid-19 on 09/06/2018 and no virus was detected.    INR today is 2.0 and therapeutic.  Given the sudden jump, I believe we need to reduce her Coumadin dose slightly to avoid a supratherapeutic INR in 1 week however I do believe she can discontinue Lovenox shots tomorrow.  Otherwise she is doing well with no concerns Past Medical History:  Diagnosis Date  . Anxiety   . Arthritis   . Cancer ([place])    ovarian, recurrent on chemo (carboplatin, doxorubicin) at [facility]  . Chest pain   . Colon polyp   . Constipation   . Depression   . Full dentures   . GERD (gastroesophageal reflux disease)   . Hemorrhoid   . HLD (hyperlipidemia) 12/03/2012  . Hyperlipidemia   . Hypertension   . Hypothyroidism   . IBS (irritable bowel syndrome)   . Neck mass   . Neuropathy    feet  . Numbness and tingling in hands   . Numbness in both legs   . Palpitations   . Schatzki's ring   . Trouble swallowing   . Wears glasses     Past Surgical History:  Procedure Laterality Date  . ABDOMINAL HYSTERECTOMY    . ABLATION    . COLONOSCOPY    . CYST REMOVAL NECK  05/13/12  . EAR CYST EXCISION  05/13/2012   Procedure: CYST REMOVAL;  Surgeon: Ralene Ok, MD;  Location: [place];  Service: General;  Laterality: Left;  excision of neck cyst  . THYROID SURGERY  10/2011 - approximate   ablation  . TONSILLECTOMY    . UPPER GI ENDOSCOPY     Current Outpatient Medications on File Prior to Visit  Medication Sig Dispense Refill  . amLODipine (NORVASC) 10 MG tablet Take 1 tablet (10 mg total) by mouth daily. 90 tablet 0  . amoxicillin-clavulanate (AUGMENTIN) 875-125 MG tablet Take 1 tablet by mouth every 12 (twelve) hours. 14 tablet 0  . colchicine 0.6 MG tablet Take 1 tablet (0.6 mg total) by mouth 2 (two) times daily  as needed. 60 tablet 6  . diphenhydrAMINE (BENADRYL) 25 MG tablet Take 25 mg by mouth at bedtime as needed. For sleep    . famotidine (PEPCID) 20 MG tablet Take by mouth.    . gabapentin (NEURONTIN) 300 MG capsule Take 1 capsule (300 mg total) by mouth at bedtime. 30 capsule 0  . glucosamine-chondroitin 500-400 MG tablet Take 1 tablet by mouth daily.    . hydrochlorothiazide (MICROZIDE) 12.5 MG capsule Take 1 capsule by mouth once daily 90 capsule 0  . levothyroxine (SYNTHROID, LEVOTHROID) 150 MCG tablet Take 1 tablet (150 mcg total) by mouth daily. 90 tablet 3  . potassium chloride SA (K-DUR,KLOR-CON) 20 MEQ tablet Take 1 tablet (20 mEq total) by mouth daily. 90 tablet 3  . predniSONE (DELTASONE) 10 MG tablet Take 1 tablet (10 mg total) by mouth daily with breakfast. 30 tablet 0  . predniSONE (DELTASONE) 20 MG tablet Take 1 tablet (20 mg total) by mouth daily with breakfast. 30 tablet 0  . simvastatin (ZOCOR) 20 MG tablet TAKE 1 TABLET BY MOUTH AT BEDTIME 90 tablet 0  . vitamin B-12 (CYANOCOBALAMIN) 1000 MCG tablet Take 1,000 mcg by mouth daily.    Marland Kitchen warfarin (COUMADIN) 5 MG tablet Take 7.5 mg by mouth daily.       No current facility-administered medications on file prior to visit.    Allergies  Allergen Reactions  . Paclitaxel Other (See Comments)    Flushing, Itching, Throat tightness   Social History   Socioeconomic History  . Marital status: Single    Spouse name: Not on file  . Number of children: Not on file  . Years of education: Not on file  . Highest education level: Not on file  Occupational History  . Not on file  Social Needs  . Financial resource strain: Not on file  . Food insecurity:    Worry: Not on file    Inability: Not on file  . Transportation needs:    Medical: Not on file    Non-medical: Not on file  Tobacco Use  . Smoking status: Former Smoker    Years: 32.00    Last attempt to quit: 06/19/2011    Years since quitting: 7.2  . Smokeless tobacco: Never Used  Substance and Sexual Activity  . Alcohol use: No  . Drug use: No  . Sexual activity: Yes  Lifestyle  . Physical activity:    Days per week: Not on file    Minutes per session: Not on file  . Stress: Not on file  Relationships  . Social connections:    Talks on phone: Not on file    Gets together: Not on file    Attends religious service: Not on file    Active member of club or organization: Not on file    Attends meetings of clubs or organizations: Not on file    Relationship status: Not on file  . Intimate partner violence:    Fear of current or ex partner: Not on file    Emotionally abused: Not on file    Physically abused: Not on file    Forced sexual activity: Not on file  Other Topics Concern  . Not on file  Social History Narrative  . Not on file     Review of Systems  All other systems reviewed and are negative.      Objective:   Physical Exam  No physical exam was performed today as the patient was seen face-to-face in  the parking lot due to her COVID-19 quarantine.  INR was found to be 1.3.  Patient is sitting comfortably in her car.  She is in no respiratory distress.   She denies any chest pain.  She denies any dyspnea.  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  Take 5 mg of Coumadin on Tuesday, Thursday, Sunday.  Recheck INR on Monday.  Discontinue Lovenox shots tomorrow.  Patient has a hypercoagulable state most likely due to her cancer and require long-term Coumadin indefinitely given the fact she had an arterial thrombosis.

## 2018-09-20 ENCOUNTER — Other Ambulatory Visit: Payer: Self-pay

## 2018-09-20 ENCOUNTER — Encounter: Payer: Self-pay | Admitting: Family Medicine

## 2018-09-20 ENCOUNTER — Ambulatory Visit: Payer: Managed Care, Other (non HMO) | Admitting: Family Medicine

## 2018-09-20 VITALS — BP 132/78 | HR 60 | Temp 98.0°F | Resp 14 | Ht 67.5 in | Wt 246.0 lb

## 2018-09-20 DIAGNOSIS — I743 Embolism and thrombosis of arteries of the lower extremities: Secondary | ICD-10-CM | POA: Diagnosis not present

## 2018-09-20 DIAGNOSIS — L03115 Cellulitis of right lower limb: Secondary | ICD-10-CM

## 2018-09-20 LAB — PT WITH INR/FINGERSTICK
INR, fingerstick: 2.5 ratio — ABNORMAL HIGH
PT, fingerstick: 30.1 s — ABNORMAL HIGH (ref 10.5–13.1)

## 2018-09-20 MED ORDER — CEPHALEXIN 500 MG PO CAPS: 500.0000 mg | ORAL_CAPSULE | Freq: Four times a day (QID) | ORAL | 0 refills | Status: DC

## 2018-09-20 NOTE — Progress Notes (Signed)
Subjective:    Patient ID: Donna [name], female    DOB: [birth year], 59 y.o.   MRN: [id number]  HPI 09/09/18 Patient was originally scheduled to be seen today in the office for hospital follow-up.  However on intake, she explains that she has been exposed to a Dietitian who has had COVID-19.  She was notified of this by [facility] after she had left the hospital.  Therefore we asked the patient to go back out to the parking lot so that I could see her outside.  The laboratory technician wore appropriate personal protective equipment.  The point to today's visit was to recheck her INR.  She was discharged from the hospital on Sunday on Coumadin 5 mg tablets daily.  Today is day 4 and her INR as expected has not risen.  It is only 1.3 however she is only been on Coumadin for 4 days.  She is also taking Lovenox 120 mg subcu twice daily.  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  I have no hospital records to review.  She is also told that her cancer has returned.  This is the most likely cause of her DVT.  Due to the patient's quarantine for COVID exposure, I was unable to perform a physical exam.  We also did not take vital signs as we attempted to minimize contact.  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  I have no records to review.  INR is subtherapeutic.  I will increase her Coumadin to 7.5 mg daily.  She will take this today, Friday, Saturday, Sunday and we will see the patient back on Monday at 9 AM and recheck her INR.  We will discontinue Lovenox once INR is therapeutic.  09/13/18 Here today for follow up.  Has been on Coumadin 7.5 mg daily Friday, Saturday, Sunday.  I was able to locate her hospital records from April 14.  I have copied these records and included them below for my reference:  Surgeries Performed: RLE angiogram with SFA, popliteal artery,and tibial artery thrombectomies as well as VBX stent placement in  aorta   Brief History of Present Illness: Donna [name] a 58 y.o.femalewith ovarian cancer (on chemo, last dose of taxol 3/24), RA (on 20mg  Prednisone daily) HTN, HLD, and hypothyroid who presents with right foot pain and coolness.   Patient states she has been having intermittent right foot painfor about 7 to 10 days.However, the pain has progressively worsened over the last 1 to 2 days and is now constant and severe. She states that on the morning of presentation (4/14) she noted her right foot to be cooler than the left foot, which prompted her to seek medical attention at the cancer clinic. At the cancer clinic she reports having sensation of pins-and-needles. She has tried Tylenol and Advil with limited relief heat helps a little bit. She denies any skin rash or redness however notes cooler right lower extremity from about the shin mid shin down. She denies fevers, chills, headaches, changes in vision, loss of consciousness, lightheadedness, dizziness, chest pain, or shortness of breath.  In the ED they were unable to palpate a pulse or obtain a doppler signals in the right lower extremity prompting vascular consultation. Patient remains hemodynamically appropriate and afebrile.  Hospital Course: As above, Donna [name] presented on 08/31/2018. She was started on a heparin drip and she was optimized for surgery. As part of the  pre-op workup, a COVID screening was performed, and she was found to be negative. On 09/02/2018, she was taken to the operating room with Dr. Tobie Poet and Dr. Jimmye Norman for a RLE angiogram with placement of aortic stent and thrombectomies of the SFA, popliteal and tibial arteries. The procedure was without complication. Estimated blood loss was 132mL. Hemostasis was achieved prior to conclusion of the case. She remained stable throughout the operation and was transferred to the PACU and subsequently to a surgical stepdown unit for continued care. Post-operatively, they were  examined routinely by floor nursing and by the vascular surgery team. Heparin drip was resumed in the immediate post-op period, however, following surgery, the hematology team was consulted and recommendation for anticoagulation therapy to include Warfarin with a Lovenox bridge was made. She was transitioned from heparin drip to Warfarin and Lovenox on 09/06/2018.   During their hospitalization, she worked with physical therapy. They were cleared on 09/05/2018, with recommendation for rolling walker. Case management worked to facilitate delivery of the rolling walker prior to discharge.  Per patient request, a referral to establish care with [facility] rheumatology was made. Donna [name] stated that she wished to transfer care from [facility] to [facility] for management of her previously diagnosed rheumatoid arthritis.   On 09/05/2018, out of an abundance of precaution, she was placed on contact and droplet isolation due to positive diagnoses among some of the unit nursing staff. Donna [name] was tested for Covid-19 on 09/06/2018 and no virus was detected.    INR today is 2.0 and therapeutic.  Given the sudden jump, I believe we need to reduce her Coumadin dose slightly to avoid a supratherapeutic INR in 1 week however I do believe she can discontinue Lovenox shots tomorrow.  Otherwise she is doing well with no concerns.  At that time, my plan was: Decrease Coumadin to 7.5 mg on Monday, Wednesday, Friday, and Saturday.  Take 5 mg of Coumadin on Tuesday, Thursday, Sunday.  Recheck INR on Monday.  Discontinue Lovenox shots tomorrow.  Patient has a hypercoagulable state most likely due to her cancer and require long-term Coumadin indefinitely given the fact she had an arterial thrombosis.  09/20/18 Here to recheck INR.  Patient's INR today is 2.5!.  I am very pleased with this.  She is currently taking 7.5 mg on Monday Wednesday and Friday and Saturday.  She is taking 5 mg of Coumadin on Tuesday, Thursday, and Sunday.  She is  now off the Lovenox.  However the surgical site where she had her thrombectomy has become tender and painful erythematous and indurated.  With a chaperone present, I examined the surgical site.  There is a vertical incision with staples in the right groin.  There is warm indurated painful erythematous skin surrounding the entire incision site.  The erythema extends out 6 cm horizontally from the incision site and 12 cm vertically.  It is firm and indurated but I do not appreciate any fluctuance.  It is tender to touch.  Past Medical History:  Diagnosis Date  . Anxiety   . Arterial thrombosis (HCC)    right sfa, popliteal artery.  Require thromectomy at [facility] and lifelong anticoagulation.  . Arthritis   . Cancer ([place])    ovarian, recurrent on chemo (carboplatin, doxorubicin) at [facility]  . Chest pain   . Colon polyp   . Constipation   . Depression   . Full dentures   . GERD (gastroesophageal reflux disease)   . Hemorrhoid   . HLD (  hyperlipidemia) 12/03/2012  . Hyperlipidemia   . Hypertension   . Hypothyroidism   . IBS (irritable bowel syndrome)   . Neck mass   . Neuropathy    feet  . Numbness and tingling in hands   . Numbness in both legs   . Palpitations   . Schatzki's ring   . Trouble swallowing   . Wears glasses    Past Surgical History:  Procedure Laterality Date  . ABDOMINAL HYSTERECTOMY    . ABLATION    . COLONOSCOPY    . CYST REMOVAL NECK  05/13/12  . EAR CYST EXCISION  05/13/2012   Procedure: CYST REMOVAL;  Surgeon: Ralene Ok, MD;  Location: [facility];  Service: General;  Laterality: Left;  excision of neck cyst  . THYROID SURGERY  10/2011 - approximate   ablation  . TONSILLECTOMY    . UPPER GI ENDOSCOPY     Current Outpatient Medications on File Prior to Visit  Medication Sig Dispense Refill  . amLODipine (NORVASC) 10 MG tablet Take 1 tablet (10 mg total) by mouth daily. 90 tablet 0  . amoxicillin-clavulanate (AUGMENTIN) 875-125 MG tablet Take 1 tablet by mouth  every 12 (twelve) hours. 14 tablet 0  . colchicine 0.6 MG tablet Take 1 tablet (0.6 mg total) by mouth 2 (two) times daily as needed. 60 tablet 6  . diphenhydrAMINE (BENADRYL) 25 MG tablet Take 25 mg by mouth at bedtime as needed. For sleep    . famotidine (PEPCID) 20 MG tablet Take by mouth.    . gabapentin (NEURONTIN) 300 MG capsule Take 1 capsule (300 mg total) by mouth at bedtime. 30 capsule 0  . glucosamine-chondroitin 500-400 MG tablet Take 1 tablet by mouth daily.    . hydrochlorothiazide (MICROZIDE) 12.5 MG capsule Take 1 capsule by mouth once daily 90 capsule 0  . levothyroxine (SYNTHROID, LEVOTHROID) 150 MCG tablet Take 1 tablet (150 mcg total) by mouth daily. 90 tablet 3  . potassium chloride SA (K-DUR,KLOR-CON) 20 MEQ tablet Take 1 tablet (20 mEq total) by mouth daily. 90 tablet 3  . predniSONE (DELTASONE) 10 MG tablet Take 1 tablet (10 mg total) by mouth daily with breakfast. 30 tablet 0  . predniSONE (DELTASONE) 20 MG tablet Take 1 tablet (20 mg total) by mouth daily with breakfast. 30 tablet 0  . simvastatin (ZOCOR) 20 MG tablet TAKE 1 TABLET BY MOUTH AT BEDTIME 90 tablet 0  . vitamin B-12 (CYANOCOBALAMIN) 1000 MCG tablet Take 1,000 mcg by mouth daily.    Marland Kitchen warfarin (COUMADIN) 5 MG tablet Take 7.5 mg by mouth daily.      No current facility-administered medications on file prior to visit.    Allergies  Allergen Reactions  . Paclitaxel Other (See Comments)    Flushing, Itching, Throat tightness   Social History   Socioeconomic History  . Marital status: Single    Spouse name: Not on file  . Number of children: Not on file  . Years of education: Not on file  . Highest education level: Not on file  Occupational History  . Not on file  Social Needs  . Financial resource strain: Not on file  . Food insecurity:    Worry: Not on file    Inability: Not on file  . Transportation needs:    Medical: Not on file    Non-medical: Not on file  Tobacco Use  . Smoking status:  Former Smoker    Years: 32.00    Last attempt to quit: 06/19/2011  Years since quitting: 7.2  . Smokeless tobacco: Never Used  Substance and Sexual Activity  . Alcohol use: No  . Drug use: No  . Sexual activity: Yes  Lifestyle  . Physical activity:    Days per week: Not on file    Minutes per session: Not on file  . Stress: Not on file  Relationships  . Social connections:    Talks on phone: Not on file    Gets together: Not on file    Attends religious service: Not on file    Active member of club or organization: Not on file    Attends meetings of clubs or organizations: Not on file    Relationship status: Not on file  . Intimate partner violence:    Fear of current or ex partner: Not on file    Emotionally abused: Not on file    Physically abused: Not on file    Forced sexual activity: Not on file  Other Topics Concern  . Not on file  Social History Narrative  . Not on file     Review of Systems  All other systems reviewed and are negative.      Objective:   Physical Exam Vitals signs reviewed.  Constitutional:      General: She is not in acute distress.    Appearance: Normal appearance. She is not ill-appearing or toxic-appearing.  Cardiovascular:     Rate and Rhythm: Normal rate and regular rhythm.     Pulses: Normal pulses.     Heart sounds: Normal heart sounds. No murmur. No friction rub. No gallop.   Pulmonary:     Effort: Pulmonary effort is normal. No respiratory distress.     Breath sounds: Normal breath sounds. No stridor. No wheezing, rhonchi or rales.  Musculoskeletal:     Right hip: She exhibits tenderness and swelling.       Legs:  Skin:    Findings: Erythema present.  Neurological:     Mental Status: She is alert.          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  Begin Keflex 500 mg 4 times daily for 7 days.  Recheck wound on Friday.  If still indurated firm and painful, we may remove the first 2 staples to see if there is any pus in the wound.  If improving on antibiotics however we will not perform this.  INR is therapeutic.  Continue Coumadin and his current dose and reassess INR in 4 weeks.

## 2018-09-24 ENCOUNTER — Other Ambulatory Visit: Payer: Self-pay

## 2018-09-24 ENCOUNTER — Ambulatory Visit: Payer: Managed Care, Other (non HMO) | Admitting: Family Medicine

## 2018-09-24 VITALS — BP 132/80 | HR 78 | Temp 98.4°F | Resp 18 | Ht 67.0 in | Wt 246.0 lb

## 2018-09-24 DIAGNOSIS — Z7901 Long term (current) use of anticoagulants: Secondary | ICD-10-CM

## 2018-09-24 DIAGNOSIS — L03115 Cellulitis of right lower limb: Secondary | ICD-10-CM | POA: Diagnosis not present

## 2018-09-24 DIAGNOSIS — I743 Embolism and thrombosis of arteries of the lower extremities: Secondary | ICD-10-CM

## 2018-09-24 LAB — PT WITH INR/FINGERSTICK
INR, fingerstick: 1.4 ratio — ABNORMAL HIGH
PT, fingerstick: 16.6 s — ABNORMAL HIGH (ref 10.5–13.1)

## 2018-09-24 MED ORDER — PREDNISONE 20 MG PO TABS: 20.0000 mg | ORAL_TABLET | Freq: Every day | ORAL | 0 refills | Status: DC

## 2018-09-24 NOTE — Progress Notes (Signed)
Subjective:    Patient ID: Donna [name], female    DOB: May 13, 1961, 58 y.o.   MRN: [id number]  HPI 09/09/18 Patient was originally scheduled to be seen today in the office for hospital follow-up.  However on intake, she explains that she has been exposed to a Dietitian who has had COVID-19.  She was notified of this by [facility] after she had left the hospital.  Therefore we asked the patient to go back out to the parking lot so that I could see her outside.  The laboratory technician wore appropriate personal protective equipment.  The point to today's visit was to recheck her INR.  She was discharged from the hospital on Sunday on Coumadin 5 mg tablets daily.  Today is day 4 and her INR as expected has not risen.  It is only 1.3 however she is only been on Coumadin for 4 days.  She is also taking Lovenox 120 mg subcu twice daily.  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  I have no hospital records to review.  She is also told that her cancer has returned.  This is the most likely cause of her DVT.  Due to the patient's quarantine for COVID exposure, I was unable to perform a physical exam.  We also did not take vital signs as we attempted to minimize contact.  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  I have no records to review.  INR is subtherapeutic.  I will increase her Coumadin to 7.5 mg daily.  She will take this today, Friday, Saturday, Sunday and we will see the patient back on Monday at 9 AM and recheck her INR.  We will discontinue Lovenox once INR is therapeutic.  09/13/18 Here today for follow up.  Has been on Coumadin 7.5 mg daily Friday, Saturday, Sunday.  I was able to locate her hospital records from April 14.  I have copied these records and included them below for my reference:  Surgeries Performed: RLE angiogram with SFA, popliteal artery,and tibial artery thrombectomies as well as VBX stent placement in  aorta   Brief History of Present Illness: Rosaleen [name] a 1 y.o.femalewith ovarian cancer (on chemo, last dose of taxol 3/24), RA (on 20mg  Prednisone daily) HTN, HLD, and hypothyroid who presents with right foot pain and coolness.   Patient states she has been having intermittent right foot painfor about 7 to 10 days.However, the pain has progressively worsened over the last 1 to 2 days and is now constant and severe. She states that on the morning of presentation (4/14) she noted her right foot to be cooler than the left foot, which prompted her to seek medical attention at the cancer clinic. At the cancer clinic she reports having sensation of pins-and-needles. She has tried Tylenol and Advil with limited relief heat helps a little bit. She denies any skin rash or redness however notes cooler right lower extremity from about the shin mid shin down. She denies fevers, chills, headaches, changes in vision, loss of consciousness, lightheadedness, dizziness, chest pain, or shortness of breath.  In the ED they were unable to palpate a pulse or obtain a doppler signals in the right lower extremity prompting vascular consultation. Patient remains hemodynamically appropriate and afebrile.  Hospital Course: As above, Donna [name] presented on 08/31/2018. She was started on a heparin drip and she was optimized for surgery. As part of the  pre-op workup, a COVID screening was performed, and she was found to be negative. On 09/02/2018, she was taken to the operating room with Dr. Tobie Poet and Dr. Jimmye Norman for a RLE angiogram with placement of aortic stent and thrombectomies of the SFA, popliteal and tibial arteries. The procedure was without complication. Estimated blood loss was 184mL. Hemostasis was achieved prior to conclusion of the case. She remained stable throughout the operation and was transferred to the PACU and subsequently to a surgical stepdown unit for continued care. Post-operatively, they were  examined routinely by floor nursing and by the vascular surgery team. Heparin drip was resumed in the immediate post-op period, however, following surgery, the hematology team was consulted and recommendation for anticoagulation therapy to include Warfarin with a Lovenox bridge was made. She was transitioned from heparin drip to Warfarin and Lovenox on 09/06/2018.   During their hospitalization, she worked with physical therapy. They were cleared on 09/05/2018, with recommendation for rolling walker. Case management worked to facilitate delivery of the rolling walker prior to discharge.  Per patient request, a referral to establish care with [facility] rheumatology was made. Ms. Down stated that she wished to transfer care from [facility] health to [facility] for management of her previously diagnosed rheumatoid arthritis.   On 09/05/2018, out of an abundance of precaution, she was placed on contact and droplet isolation due to positive diagnoses among some of the unit nursing staff. Ms. Yiu was tested for Covid-19 on 09/06/2018 and no virus was detected.    INR today is 2.0 and therapeutic.  Given the sudden jump, I believe we need to reduce her Coumadin dose slightly to avoid a supratherapeutic INR in 1 week however I do believe she can discontinue Lovenox shots tomorrow.  Otherwise she is doing well with no concerns.  At that time, my plan was: Decrease Coumadin to 7.5 mg on Monday, Wednesday, Friday, and Saturday.  Take 5 mg of Coumadin on Tuesday, Thursday, Sunday.  Recheck INR on Monday.  Discontinue Lovenox shots tomorrow.  Patient has a hypercoagulable state most likely due to her cancer and require long-term Coumadin indefinitely given the fact she had an arterial thrombosis.  09/20/18 Here to recheck INR.  Patient's INR today is 2.5!.  I am very pleased with this.  She is currently taking 7.5 mg on Monday Wednesday and Friday and Saturday.  She is taking 5 mg of Coumadin on Tuesday, Thursday, and Sunday.  She is  now off the Lovenox.  However the surgical site where she had her thrombectomy has become tender and painful erythematous and indurated.  With a chaperone present, I examined the surgical site.  There is a vertical incision with staples in the right groin.  There is warm indurated painful erythematous skin surrounding the entire incision site.  The erythema extends out 6 cm horizontally from the incision site and 12 cm vertically.  It is firm and indurated but I do not appreciate any fluctuance.  It is tender to touch.  At that time, my plan was: Patient appears to be developing a secondary cellulitis.  Begin Keflex 500 mg 4 times daily for 7 days.  Recheck wound on Friday.  If still indurated firm and painful, we may remove the first 2 staples to see if there is any pus in the wound.  If improving on antibiotics however we will not perform this.  INR is therapeutic.  Continue Coumadin and his current dose and reassess INR in 4 weeks.  09/24/18  Since  taking the antibiotic, the redness has improved on her leg.  The induration is much better.  The pain has improved.  There is white and serous drainage coming from the middle portion of the incision however there is no fluctuance to palpation and there is no tenderness to palpation.  Therefore I do not believe that the staples need to be removed today.  She has an additional 3 days of antibiotics to carry her through the weekend.  However her INR has dropped precipitously from 2.5 down to 1.4 Past Medical History:  Diagnosis Date  . Anxiety   . Arterial thrombosis (HCC)    right sfa, popliteal artery.  Require thromectomy at [facility] and lifelong anticoagulation.  . Arthritis   . Cancer ([place])    ovarian, recurrent on chemo (carboplatin, doxorubicin) at [facility]  . Chest pain   . Colon polyp   . Constipation   . Depression   . Full dentures   . GERD (gastroesophageal reflux disease)   . Hemorrhoid   . HLD (hyperlipidemia) 12/03/2012  . Hyperlipidemia   .  Hypertension   . Hypothyroidism   . IBS (irritable bowel syndrome)   . Neck mass   . Neuropathy    feet  . Numbness and tingling in hands   . Numbness in both legs   . Palpitations   . Schatzki's ring   . Trouble swallowing   . Wears glasses    Past Surgical History:  Procedure Laterality Date  . ABDOMINAL HYSTERECTOMY    . ABLATION    . COLONOSCOPY    . CYST REMOVAL NECK  05/13/12  . EAR CYST EXCISION  05/13/2012   Procedure: CYST REMOVAL;  Surgeon: Ralene Ok, MD;  Location: [facility];  Service: General;  Laterality: Left;  excision of neck cyst  . THYROID SURGERY  10/2011 - approximate   ablation  . TONSILLECTOMY    . UPPER GI ENDOSCOPY     Current Outpatient Medications on File Prior to Visit  Medication Sig Dispense Refill  . amLODipine (NORVASC) 10 MG tablet Take 1 tablet (10 mg total) by mouth daily. 90 tablet 0  . cephALEXin (KEFLEX) 500 MG capsule Take 1 capsule (500 mg total) by mouth 4 (four) times daily. 28 capsule 0  . colchicine 0.6 MG tablet Take 1 tablet (0.6 mg total) by mouth 2 (two) times daily as needed. 60 tablet 6  . dexamethasone (DECADRON) 4 MG tablet Take 2 tablets (8 mg) by mouth daily with breakfast for 3 days after chemotherapy each cycle.  Do not take prednisone on the days you take dexamethasone.    . diphenhydrAMINE (BENADRYL) 25 MG tablet Take 25 mg by mouth at bedtime as needed. For sleep    . famotidine (PEPCID) 20 MG tablet Take by mouth.    . gabapentin (NEURONTIN) 300 MG capsule Take 1 capsule (300 mg total) by mouth at bedtime. (Patient taking differently: Take 300 mg by mouth 3 (three) times daily. ) 30 capsule 0  . glucosamine-chondroitin 500-400 MG tablet Take 1 tablet by mouth daily.    . hydrochlorothiazide (MICROZIDE) 12.5 MG capsule Take 1 capsule by mouth once daily 90 capsule 0  . levothyroxine (SYNTHROID, LEVOTHROID) 150 MCG tablet Take 1 tablet (150 mcg total) by mouth daily. 90 tablet 3  . OLANZapine (ZYPREXA) 5 MG tablet Take 1  tablet by mouth nightly for 4 nights, starting on the day of chemotherapy each cycle.    . ondansetron (ZOFRAN) 8 MG tablet Take by  mouth.    . oxyCODONE (OXY IR/ROXICODONE) 5 MG immediate release tablet TAKE 1 TO 2 TABLETS BY MOUTH EVERY 6 HOURS AS NEEDED FOR PAIN FOR UP TO 5 DAYS    . potassium chloride SA (K-DUR,KLOR-CON) 20 MEQ tablet Take 1 tablet (20 mEq total) by mouth daily. 90 tablet 3  . predniSONE (DELTASONE) 10 MG tablet Take 1 tablet (10 mg total) by mouth daily with breakfast. 30 tablet 0  . predniSONE (DELTASONE) 20 MG tablet Take 1 tablet (20 mg total) by mouth daily with breakfast. 30 tablet 0  . simvastatin (ZOCOR) 20 MG tablet TAKE 1 TABLET BY MOUTH AT BEDTIME 90 tablet 0  . vitamin B-12 (CYANOCOBALAMIN) 1000 MCG tablet Take 1,000 mcg by mouth daily.    Marland Kitchen warfarin (COUMADIN) 5 MG tablet Take 7.5 mg by mouth daily.      No current facility-administered medications on file prior to visit.    Allergies  Allergen Reactions  . Paclitaxel Other (See Comments)    Flushing, Itching, Throat tightness   Social History   Socioeconomic History  . Marital status: Single    Spouse name: Not on file  . Number of children: Not on file  . Years of education: Not on file  . Highest education level: Not on file  Occupational History  . Not on file  Social Needs  . Financial resource strain: Not on file  . Food insecurity:    Worry: Not on file    Inability: Not on file  . Transportation needs:    Medical: Not on file    Non-medical: Not on file  Tobacco Use  . Smoking status: Former Smoker    Years: 32.00    Last attempt to quit: 06/19/2011    Years since quitting: 7.2  . Smokeless tobacco: Never Used  Substance and Sexual Activity  . Alcohol use: No  . Drug use: No  . Sexual activity: Yes  Lifestyle  . Physical activity:    Days per week: Not on file    Minutes per session: Not on file  . Stress: Not on file  Relationships  . Social connections:    Talks on phone:  Not on file    Gets together: Not on file    Attends religious service: Not on file    Active member of club or organization: Not on file    Attends meetings of clubs or organizations: Not on file    Relationship status: Not on file  . Intimate partner violence:    Fear of current or ex partner: Not on file    Emotionally abused: Not on file    Physically abused: Not on file    Forced sexual activity: Not on file  Other Topics Concern  . Not on file  Social History Narrative  . Not on file     Review of Systems  All other systems reviewed and are negative.      Objective:   Physical Exam Vitals signs reviewed.  Constitutional:      General: She is not in acute distress.    Appearance: Normal appearance. She is not ill-appearing or toxic-appearing.  Cardiovascular:     Rate and Rhythm: Normal rate and regular rhythm.     Pulses: Normal pulses.     Heart sounds: Normal heart sounds. No murmur. No friction rub. No gallop.   Pulmonary:     Effort: Pulmonary effort is normal. No respiratory distress.     Breath sounds: Normal  breath sounds. No stridor. No wheezing, rhonchi or rales.  Musculoskeletal:     Right hip: She exhibits tenderness and swelling.       Legs:  Skin:    Findings: Erythema present.  Neurological:     Mental Status: She is alert.          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  I will recheck this Monday after she is completed antibiotics.  If it still draining from the wound I would recommend removing the middle 2 staples and probing the wound for any collection of pus that may be persistent however it appears to have improved on antibiotics.  I am concerned by the drop in her INR.  I am not certain if that is due to antibiotics or if that could be due to the chemotherapy.  Therefore I  recommended that she take an additional 5 mg of Coumadin today and then increase her normal routine to 7.5 mg every day.  We will recheck her INR next week

## 2018-09-28 ENCOUNTER — Other Ambulatory Visit: Payer: Self-pay

## 2018-09-28 ENCOUNTER — Encounter: Payer: Self-pay | Admitting: Family Medicine

## 2018-09-28 ENCOUNTER — Ambulatory Visit (INDEPENDENT_AMBULATORY_CARE_PROVIDER_SITE_OTHER): Payer: Managed Care, Other (non HMO) | Admitting: Family Medicine

## 2018-09-28 VITALS — BP 130/78 | HR 80 | Temp 97.9°F | Resp 16 | Ht 67.5 in | Wt 249.0 lb

## 2018-09-28 DIAGNOSIS — R300 Dysuria: Secondary | ICD-10-CM

## 2018-09-28 DIAGNOSIS — I743 Embolism and thrombosis of arteries of the lower extremities: Secondary | ICD-10-CM

## 2018-09-28 DIAGNOSIS — L03115 Cellulitis of right lower limb: Secondary | ICD-10-CM

## 2018-09-28 DIAGNOSIS — E7800 Pure hypercholesterolemia, unspecified: Secondary | ICD-10-CM

## 2018-09-28 LAB — PT WITH INR/FINGERSTICK
INR, fingerstick: 1.6 ratio — ABNORMAL HIGH
PT, fingerstick: 19.4 s — ABNORMAL HIGH (ref 10.5–13.1)

## 2018-09-28 MED ORDER — WARFARIN SODIUM 5 MG PO TABS: 7.5000 mg | ORAL_TABLET | Freq: Every day | ORAL | 3 refills | Status: DC

## 2018-09-28 NOTE — Progress Notes (Signed)
Subjective:    Patient ID: Donna [name], female    DOB: 1961/03/14, 58 y.o.   MRN: [id number]  HPI 09/09/18 Patient was originally scheduled to be seen today in the office for hospital follow-up.  However on intake, she explains that she has been exposed to a Dietitian who has had COVID-19.  She was notified of this by [facility] after she had left the hospital.  Therefore we asked the patient to go back out to the parking lot so that I could see her outside.  The laboratory technician wore appropriate personal protective equipment.  The point to today's visit was to recheck her INR.  She was discharged from the hospital on Sunday on Coumadin 5 mg tablets daily.  Today is day 4 and her INR as expected has not risen.  It is only 1.3 however she is only been on Coumadin for 4 days.  She is also taking Lovenox 120 mg subcu twice daily.  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  I have no hospital records to review.  She is also told that her cancer has returned.  This is the most likely cause of her DVT.  Due to the patient's quarantine for COVID exposure, I was unable to perform a physical exam.  We also did not take vital signs as we attempted to minimize contact.  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  I have no records to review.  INR is subtherapeutic.  I will increase her Coumadin to 7.5 mg daily.  She will take this today, Friday, Saturday, Sunday and we will see the patient back on Monday at 9 AM and recheck her INR.  We will discontinue Lovenox once INR is therapeutic.  09/13/18 Here today for follow up.  Has been on Coumadin 7.5 mg daily Friday, Saturday, Sunday.  I was able to locate her hospital records from April 14.  I have copied these records and included them below for my reference:  Surgeries Performed: RLE angiogram with SFA, popliteal artery,and tibial artery thrombectomies as well as VBX stent placement in  aorta   Brief History of Present Illness: Donna [name] a 5 y.o.femalewith ovarian cancer (on chemo, last dose of taxol 3/24), RA (on 20mg  Prednisone daily) HTN, HLD, and hypothyroid who presents with right foot pain and coolness.   Patient states she has been having intermittent right foot painfor about 7 to 10 days.However, the pain has progressively worsened over the last 1 to 2 days and is now constant and severe. She states that on the morning of presentation (4/14) she noted her right foot to be cooler than the left foot, which prompted her to seek medical attention at the cancer clinic. At the cancer clinic she reports having sensation of pins-and-needles. She has tried Tylenol and Advil with limited relief heat helps a little bit. She denies any skin rash or redness however notes cooler right lower extremity from about the shin mid shin down. She denies fevers, chills, headaches, changes in vision, loss of consciousness, lightheadedness, dizziness, chest pain, or shortness of breath.  In the ED they were unable to palpate a pulse or obtain a doppler signals in the right lower extremity prompting vascular consultation. Patient remains hemodynamically appropriate and afebrile.  Hospital Course: As above, Donna [name] presented on 08/31/2018. She was started on a heparin drip and she was optimized for surgery. As part of the  pre-op workup, a COVID screening was performed, and she was found to be negative. On 09/02/2018, she was taken to the operating room with Dr. Tobie Poet and Dr. Jimmye Norman for a RLE angiogram with placement of aortic stent and thrombectomies of the SFA, popliteal and tibial arteries. The procedure was without complication. Estimated blood loss was 19mL. Hemostasis was achieved prior to conclusion of the case. She remained stable throughout the operation and was transferred to the PACU and subsequently to a surgical stepdown unit for continued care. Post-operatively, they were  examined routinely by floor nursing and by the vascular surgery team. Heparin drip was resumed in the immediate post-op period, however, following surgery, the hematology team was consulted and recommendation for anticoagulation therapy to include Warfarin with a Lovenox bridge was made. She was transitioned from heparin drip to Warfarin and Lovenox on 09/06/2018.   During their hospitalization, she worked with physical therapy. They were cleared on 09/05/2018, with recommendation for rolling walker. Case management worked to facilitate delivery of the rolling walker prior to discharge.  Per patient request, a referral to establish care with [facility] rheumatology was made. Donna [name] stated that she wished to transfer care from [facility] health to [facility] for management of her previously diagnosed rheumatoid arthritis.   On 09/05/2018, out of an abundance of precaution, she was placed on contact and droplet isolation due to positive diagnoses among some of the unit nursing staff. Donna [name] was tested for Covid-19 on 09/06/2018 and no virus was detected.    INR today is 2.0 and therapeutic.  Given the sudden jump, I believe we need to reduce her Coumadin dose slightly to avoid a supratherapeutic INR in 1 week however I do believe she can discontinue Lovenox shots tomorrow.  Otherwise she is doing well with no concerns.  At that time, my plan was: Decrease Coumadin to 7.5 mg on Monday, Wednesday, Friday, and Saturday.  Take 5 mg of Coumadin on Tuesday, Thursday, Sunday.  Recheck INR on Monday.  Discontinue Lovenox shots tomorrow.  Patient has a hypercoagulable state most likely due to her cancer and require long-term Coumadin indefinitely given the fact she had an arterial thrombosis.  09/20/18 Here to recheck INR.  Patient's INR today is 2.5!.  I am very pleased with this.  She is currently taking 7.5 mg on Monday Wednesday and Friday and Saturday.  She is taking 5 mg of Coumadin on Tuesday, Thursday, and Sunday.  She is  now off the Lovenox.  However the surgical site where she had her thrombectomy has become tender and painful erythematous and indurated.  With a chaperone present, I examined the surgical site.  There is a vertical incision with [name] in the right groin.  There is warm indurated painful erythematous skin surrounding the entire incision site.  The erythema extends out 6 cm horizontally from the incision site and 12 cm vertically.  It is firm and indurated but I do not appreciate any fluctuance.  It is tender to touch.  At that time, my plan was: Patient appears to be developing a secondary cellulitis.  Begin Keflex 500 mg 4 times daily for 7 days.  Recheck wound on Friday.  If still indurated firm and painful, we may remove the first 2 [name] to see if there is any pus in the wound.  If improving on antibiotics however we will not perform this.  INR is therapeutic.  Continue Coumadin and his current dose and reassess INR in 4 weeks.  09/24/18  Since  taking the antibiotic, the redness has improved on her leg.  The induration is much better.  The pain has improved.  There is white and serous drainage coming from the middle portion of the incision however there is no fluctuance to palpation and there is no tenderness to palpation.  Therefore I do not believe that the [name] need to be removed today.  She has an additional 3 days of antibiotics to carry her through the weekend.  However her INR has dropped precipitously from 2.5 down to 1.4.  At that time, my plan was: Cellulitis seems to be improved.  I will recheck this Monday after she is completed antibiotics.  If it still draining from the wound I would recommend removing the middle 2 [name] and probing the wound for any collection of pus that may be persistent however it appears to have improved on antibiotics.  I am concerned by the drop in her INR.  I am not certain if that is due to antibiotics or if that could be due to the chemotherapy.  Therefore I  recommended that she take an additional 5 mg of Coumadin today and then increase her normal routine to 7.5 mg every day.  We will recheck her INR next week  09/28/18 Here for recheck.  The redness has improved around the wound on her right leg.  There is now a pinkish color directly adjacent to the operative site but noes evidence of spreading cellulitis.  It is slightly warmer to the touch than the surrounding area but it is not painful.  It is not fiery red.  The patient denies any tenderness at all.  There is serous drainage coming from the center of the wound where the skin has dehisced slightly however there is no pus that I can express from that area.  Patient has reported some mild dysuria since starting the antibiotics and is also having some pelvic itching.  I suspect a yeast infection.  Her INR today has improved to 1.6.  Obviously this is too soon to check an INR after making a change just 4 days ago.  Therefore I do believe the 7.5 will be the correct/appropriate dose.  However I also want to try to get her to therapeutic as quick as possible.  She would also like to check her cholesterol while she is here Past Medical History:  Diagnosis Date  . Anxiety   . Arterial thrombosis (HCC)    right sfa, popliteal artery.  Require thromectomy at [facility] and lifelong anticoagulation.  . Arthritis   . Cancer ([place])    ovarian, recurrent on chemo (carboplatin, doxorubicin) at [facility]  . Chest pain   . Colon polyp   . Constipation   . Depression   . Full dentures   . GERD (gastroesophageal reflux disease)   . Hemorrhoid   . HLD (hyperlipidemia) 12/03/2012  . Hyperlipidemia   . Hypertension   . Hypothyroidism   . IBS (irritable bowel syndrome)   . Neck mass   . Neuropathy    feet  . Numbness and tingling in hands   . Numbness in both legs   . Palpitations   . Schatzki's ring   . Trouble swallowing   . Wears glasses    Past Surgical History:  Procedure Laterality Date  . ABDOMINAL  HYSTERECTOMY    . ABLATION    . COLONOSCOPY    . CYST REMOVAL NECK  05/13/12  . EAR CYST EXCISION  05/13/2012   Procedure: CYST  REMOVAL;  Surgeon: Ralene Ok, MD;  Location: [place];  Service: General;  Laterality: Left;  excision of neck cyst  . THYROID SURGERY  10/2011 - approximate   ablation  . TONSILLECTOMY    . UPPER GI ENDOSCOPY     Current Outpatient Medications on File Prior to Visit  Medication Sig Dispense Refill  . amLODipine (NORVASC) 10 MG tablet Take 1 tablet (10 mg total) by mouth daily. 90 tablet 0  . cephALEXin (KEFLEX) 500 MG capsule Take 1 capsule (500 mg total) by mouth 4 (four) times daily. 28 capsule 0  . colchicine 0.6 MG tablet Take 1 tablet (0.6 mg total) by mouth 2 (two) times daily as needed. 60 tablet 6  . dexamethasone (DECADRON) 4 MG tablet Take 2 tablets (8 mg) by mouth daily with breakfast for 3 days after chemotherapy each cycle.  Do not take prednisone on the days you take dexamethasone.    . diphenhydrAMINE (BENADRYL) 25 MG tablet Take 25 mg by mouth at bedtime as needed. For sleep    . famotidine (PEPCID) 20 MG tablet Take by mouth.    . gabapentin (NEURONTIN) 300 MG capsule Take 1 capsule (300 mg total) by mouth at bedtime. (Patient taking differently: Take 300 mg by mouth 3 (three) times daily. ) 30 capsule 0  . glucosamine-chondroitin 500-400 MG tablet Take 1 tablet by mouth daily.    . hydrochlorothiazide (MICROZIDE) 12.5 MG capsule Take 1 capsule by mouth once daily 90 capsule 0  . levothyroxine (SYNTHROID, LEVOTHROID) 150 MCG tablet Take 1 tablet (150 mcg total) by mouth daily. 90 tablet 3  . OLANZapine (ZYPREXA) 5 MG tablet Take 1 tablet by mouth nightly for 4 nights, starting on the day of chemotherapy each cycle.    . ondansetron (ZOFRAN) 8 MG tablet Take by mouth.    . oxyCODONE (OXY IR/ROXICODONE) 5 MG immediate release tablet TAKE 1 TO 2 TABLETS BY MOUTH EVERY 6 HOURS AS NEEDED FOR PAIN FOR UP TO 5 DAYS    . potassium chloride SA  (K-DUR,KLOR-CON) 20 MEQ tablet Take 1 tablet (20 mEq total) by mouth daily. 90 tablet 3  . predniSONE (DELTASONE) 20 MG tablet Take 1 tablet (20 mg total) by mouth daily with breakfast. 30 tablet 0  . simvastatin (ZOCOR) 20 MG tablet TAKE 1 TABLET BY MOUTH AT BEDTIME 90 tablet 0  . vitamin B-12 (CYANOCOBALAMIN) 1000 MCG tablet Take 1,000 mcg by mouth daily.    Marland Kitchen warfarin (COUMADIN) 5 MG tablet Take 7.5 mg by mouth daily.      No current facility-administered medications on file prior to visit.    Allergies  Allergen Reactions  . Paclitaxel Other (See Comments)    Flushing, Itching, Throat tightness   Social History   Socioeconomic History  . Marital status: Single    Spouse name: Not on file  . Number of children: Not on file  . Years of education: Not on file  . Highest education level: Not on file  Occupational History  . Not on file  Social Needs  . Financial resource strain: Not on file  . Food insecurity:    Worry: Not on file    Inability: Not on file  . Transportation needs:    Medical: Not on file    Non-medical: Not on file  Tobacco Use  . Smoking status: Former Smoker    Years: 32.00    Last attempt to quit: 06/19/2011    Years since quitting: 7.2  . Smokeless  tobacco: Never Used  Substance and Sexual Activity  . Alcohol use: No  . Drug use: No  . Sexual activity: Yes  Lifestyle  . Physical activity:    Days per week: Not on file    Minutes per session: Not on file  . Stress: Not on file  Relationships  . Social connections:    Talks on phone: Not on file    Gets together: Not on file    Attends religious service: Not on file    Active member of club or organization: Not on file    Attends meetings of clubs or organizations: Not on file    Relationship status: Not on file  . Intimate partner violence:    Fear of current or ex partner: Not on file    Emotionally abused: Not on file    Physically abused: Not on file    Forced sexual activity: Not on  file  Other Topics Concern  . Not on file  Social History Narrative  . Not on file     Review of Systems  All other systems reviewed and are negative.      Objective:   Physical Exam Vitals signs reviewed.  Constitutional:      General: She is not in acute distress.    Appearance: Normal appearance. She is not ill-appearing or toxic-appearing.  Cardiovascular:     Rate and Rhythm: Normal rate and regular rhythm.     Pulses: Normal pulses.     Heart sounds: Normal heart sounds. No murmur. No friction rub. No gallop.   Pulmonary:     Effort: Pulmonary effort is normal. No respiratory distress.     Breath sounds: Normal breath sounds. No stridor. No wheezing, rhonchi or rales.  Musculoskeletal:     Right hip: She exhibits no tenderness and no swelling.       Legs:  Skin:    Findings: No erythema.  Neurological:     Mental Status: She is alert.          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  INR is subtherapeutic therefore I recommended that she bolused 2 mg of Coumadin today, 10 mg of Coumadin tomorrow and then maintain at 7.5 mg of Coumadin daily thereafter.  Recheck INR in 10 days to make gross adjustment if necessary.  Cellulitis seems to be improving.  She has 1 day left of Keflex.  I see no residual infection today.  I gave the patient warning signs of recurrent infection and when to come back but otherwise finished antibiotic and no further follow-up is necessary for that unless warning signs develop.  I suspect her dysuria is likely due to a yeast infection however I will check a urinalysis.  If negative I  would recommend trying Diflucan for a yeast infection

## 2018-10-01 LAB — CBC WITH DIFFERENTIAL/PLATELET
Absolute Monocytes: 239 cells/uL (ref 200–950)
Basophils Absolute: 8 cells/uL (ref 0–200)
Basophils Relative: 0.1 %
Eosinophils Absolute: 8 cells/uL — ABNORMAL LOW (ref 15–500)
Eosinophils Relative: 0.1 %
HCT: 25.5 % — ABNORMAL LOW (ref 35.0–45.0)
Hemoglobin: 8.6 g/dL — ABNORMAL LOW (ref 11.7–15.5)
Lymphs Abs: 1863 cells/uL (ref 850–3900)
MCH: 29.8 pg (ref 27.0–33.0)
MCHC: 33.7 g/dL (ref 32.0–36.0)
MCV: 88.2 fL (ref 80.0–100.0)
MPV: 9.3 fL (ref 7.5–12.5)
Monocytes Relative: 3.1 %
Neutro Abs: 5583 cells/uL (ref 1500–7800)
Neutrophils Relative %: 72.5 %
Platelets: 187 10*3/uL (ref 140–400)
RBC: 2.89 10*6/uL — ABNORMAL LOW (ref 3.80–5.10)
RDW: 19.6 % — ABNORMAL HIGH (ref 11.0–15.0)
Total Lymphocyte: 24.2 %
WBC: 7.7 10*3/uL (ref 3.8–10.8)

## 2018-10-01 LAB — LIPID PANEL
Cholesterol: 237 mg/dL — ABNORMAL HIGH (ref <200)
HDL: 47 mg/dL — ABNORMAL LOW (ref >=50)
LDL Cholesterol (Calc): 156 mg/dL (calc) — ABNORMAL HIGH
Non-HDL Cholesterol (Calc): 190 mg/dL (calc) — ABNORMAL HIGH (ref <130)
Total CHOL/HDL Ratio: 5 (calc) — ABNORMAL HIGH (ref <5.0)
Triglycerides: 187 mg/dL — ABNORMAL HIGH (ref <150)

## 2018-10-01 LAB — COMPLETE METABOLIC PANEL WITH GFR
AG Ratio: 1.8 (calc) (ref 1.0–2.5)
ALT: 18 U/L (ref 6–29)
AST: 14 U/L (ref 10–35)
Albumin: 3.9 g/dL (ref 3.6–5.1)
Alkaline phosphatase (APISO): 90 U/L (ref 37–153)
BUN: 11 mg/dL (ref 7–25)
CO2: 28 mmol/L (ref 20–32)
Calcium: 9.4 mg/dL (ref 8.6–10.4)
Chloride: 101 mmol/L (ref 98–110)
Creat: 0.61 mg/dL (ref 0.50–1.05)
GFR, Est African American: 115 mL/min/{1.73_m2} (ref >=60)
GFR, Est Non African American: 99 mL/min/{1.73_m2} (ref >=60)
Globulin: 2.2 g/dL (calc) (ref 1.9–3.7)
Glucose, Bld: 103 mg/dL — ABNORMAL HIGH (ref 65–99)
Potassium: 4.3 mmol/L (ref 3.5–5.3)
Sodium: 142 mmol/L (ref 135–146)
Total Bilirubin: 0.5 mg/dL (ref 0.2–1.2)
Total Protein: 6.1 g/dL (ref 6.1–8.1)

## 2018-10-01 LAB — TEST AUTHORIZATION

## 2018-10-01 LAB — IRON: Iron: 95 ug/dL (ref 45–160)

## 2018-10-08 ENCOUNTER — Ambulatory Visit (INDEPENDENT_AMBULATORY_CARE_PROVIDER_SITE_OTHER): Payer: Managed Care, Other (non HMO) | Admitting: Family Medicine

## 2018-10-08 ENCOUNTER — Encounter: Payer: Self-pay | Admitting: Family Medicine

## 2018-10-08 ENCOUNTER — Other Ambulatory Visit: Payer: Self-pay

## 2018-10-08 VITALS — BP 118/68 | HR 80 | Temp 98.5°F | Resp 16 | Ht 67.5 in | Wt 249.0 lb

## 2018-10-08 DIAGNOSIS — D508 Other iron deficiency anemias: Secondary | ICD-10-CM | POA: Diagnosis not present

## 2018-10-08 DIAGNOSIS — I743 Embolism and thrombosis of arteries of the lower extremities: Secondary | ICD-10-CM | POA: Diagnosis not present

## 2018-10-08 LAB — CBC WITH DIFFERENTIAL/PLATELET
Absolute Monocytes: 649 cells/uL (ref 200–950)
Basophils Absolute: 19 cells/uL (ref 0–200)
Basophils Relative: 0.3 %
Eosinophils Absolute: 13 cells/uL — ABNORMAL LOW (ref 15–500)
Eosinophils Relative: 0.2 %
HCT: 25.3 % — ABNORMAL LOW (ref 35.0–45.0)
Hemoglobin: 8.6 g/dL — ABNORMAL LOW (ref 11.7–15.5)
Lymphs Abs: 2085 cells/uL (ref 850–3900)
MCH: 30.8 pg (ref 27.0–33.0)
MCHC: 34 g/dL (ref 32.0–36.0)
MCV: 90.7 fL (ref 80.0–100.0)
MPV: 9.3 fL (ref 7.5–12.5)
Monocytes Relative: 10.3 %
Neutro Abs: 3534 cells/uL (ref 1500–7800)
Neutrophils Relative %: 56.1 %
Platelets: 366 10*3/uL (ref 140–400)
RBC: 2.79 10*6/uL — ABNORMAL LOW (ref 3.80–5.10)
RDW: 22.2 % — ABNORMAL HIGH (ref 11.0–15.0)
Total Lymphocyte: 33.1 %
WBC: 6.3 10*3/uL (ref 3.8–10.8)

## 2018-10-08 LAB — PT WITH INR/FINGERSTICK
INR, fingerstick: 1.5 ratio — ABNORMAL HIGH
PT, fingerstick: 18.3 s — ABNORMAL HIGH (ref 10.5–13.1)

## 2018-10-08 NOTE — Progress Notes (Signed)
Subjective:    Patient ID: Donna [name], female    DOB: [birth year], 58 y.o.   MRN: [id number]  HPI 09/09/18 Patient was originally scheduled to be seen today in the office for hospital follow-up.  However on intake, she explains that she has been exposed to a Dietitian who has had COVID-19.  She was notified of this by [facility] after she had left the hospital.  Therefore we asked the patient to go back out to the parking lot so that I could see her outside.  The laboratory technician wore appropriate personal protective equipment.  The point to today's visit was to recheck her INR.  She was discharged from the hospital on Sunday on Coumadin 5 mg tablets daily.  Today is day 4 and her INR as expected has not risen.  It is only 1.3 however she is only been on Coumadin for 4 days.  She is also taking Lovenox 120 mg subcu twice daily.  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  I have no hospital records to review.  She is also told that her cancer has returned.  This is the most likely cause of her DVT.  Due to the patient's quarantine for COVID exposure, I was unable to perform a physical exam.  We also did not take vital signs as we attempted to minimize contact.  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  I have no records to review.  INR is subtherapeutic.  I will increase her Coumadin to 7.5 mg daily.  She will take this today, Friday, Saturday, Sunday and we will see the patient back on Monday at 9 AM and recheck her INR.  We will discontinue Lovenox once INR is therapeutic.  09/13/18 Here today for follow up.  Has been on Coumadin 7.5 mg daily Friday, Saturday, Sunday.  I was able to locate her hospital records from April 14.  I have copied these records and included them below for my reference:  Surgeries Performed: RLE angiogram with SFA, popliteal artery,and tibial artery thrombectomies as well as VBX stent placement in  aorta   Brief History of Present Illness: Donna [name] a 49 y.o.femalewith ovarian cancer (on chemo, last dose of taxol 3/24), RA (on 20mg  Prednisone daily) HTN, HLD, and hypothyroid who presents with right foot pain and coolness.   Patient states she has been having intermittent right foot painfor about 7 to 10 days.However, the pain has progressively worsened over the last 1 to 2 days and is now constant and severe. She states that on the morning of presentation (4/14) she noted her right foot to be cooler than the left foot, which prompted her to seek medical attention at the cancer clinic. At the cancer clinic she reports having sensation of pins-and-needles. She has tried Tylenol and Advil with limited relief heat helps a little bit. She denies any skin rash or redness however notes cooler right lower extremity from about the shin mid shin down. She denies fevers, chills, headaches, changes in vision, loss of consciousness, lightheadedness, dizziness, chest pain, or shortness of breath.  In the ED they were unable to palpate a pulse or obtain a doppler signals in the right lower extremity prompting vascular consultation. Patient remains hemodynamically appropriate and afebrile.  Hospital Course: As above, [name] presented on 08/31/2018. She was started on a heparin drip and she was optimized for surgery. As part of the  pre-op workup, a COVID screening was performed, and she was found to be negative. On 09/02/2018, she was taken to the operating room with Dr. Tobie Poet and Dr. Jimmye Norman for a RLE angiogram with placement of aortic stent and thrombectomies of the SFA, popliteal and tibial arteries. The procedure was without complication. Estimated blood loss was 130mL. Hemostasis was achieved prior to conclusion of the case. She remained stable throughout the operation and was transferred to the PACU and subsequently to a surgical stepdown unit for continued care. Post-operatively, they were  examined routinely by floor nursing and by the vascular surgery team. Heparin drip was resumed in the immediate post-op period, however, following surgery, the hematology team was consulted and recommendation for anticoagulation therapy to include Warfarin with a Lovenox bridge was made. She was transitioned from heparin drip to Warfarin and Lovenox on 09/06/2018.   During their hospitalization, she worked with physical therapy. They were cleared on 09/05/2018, with recommendation for rolling walker. Case management worked to facilitate delivery of the rolling walker prior to discharge.  Per patient request, a referral to establish care with [facility] rheumatology was made. Ms. Gieske stated that she wished to transfer care from [facility] health to [facility] for management of her previously diagnosed rheumatoid arthritis.   On 09/05/2018, out of an abundance of precaution, she was placed on contact and droplet isolation due to positive diagnoses among some of the unit nursing staff. Ms. Bowdish was tested for Covid-19 on 09/06/2018 and no virus was detected.    INR today is 2.0 and therapeutic.  Given the sudden jump, I believe we need to reduce her Coumadin dose slightly to avoid a supratherapeutic INR in 1 week however I do believe she can discontinue Lovenox shots tomorrow.  Otherwise she is doing well with no concerns.  At that time, my plan was: Decrease Coumadin to 7.5 mg on Monday, Wednesday, Friday, and Saturday.  Take 5 mg of Coumadin on Tuesday, Thursday, Sunday.  Recheck INR on Monday.  Discontinue Lovenox shots tomorrow.  Patient has a hypercoagulable state most likely due to her cancer and require long-term Coumadin indefinitely given the fact she had an arterial thrombosis.  09/20/18 Here to recheck INR.  Patient's INR today is 2.5!.  I am very pleased with this.  She is currently taking 7.5 mg on Monday Wednesday and Friday and Saturday.  She is taking 5 mg of Coumadin on Tuesday, Thursday, and Sunday.  She is  now off the Lovenox.  However the surgical site where she had her thrombectomy has become tender and painful erythematous and indurated.  With a chaperone present, I examined the surgical site.  There is a vertical incision with staples in the right groin.  There is warm indurated painful erythematous skin surrounding the entire incision site.  The erythema extends out 6 cm horizontally from the incision site and 12 cm vertically.  It is firm and indurated but I do not appreciate any fluctuance.  It is tender to touch.  At that time, my plan was: Patient appears to be developing a secondary cellulitis.  Begin Keflex 500 mg 4 times daily for 7 days.  Recheck wound on Friday.  If still indurated firm and painful, we may remove the first 2 staples to see if there is any pus in the wound.  If improving on antibiotics however we will not perform this.  INR is therapeutic.  Continue Coumadin and his current dose and reassess INR in 4 weeks.  09/24/18  Since  taking the antibiotic, the redness has improved on her leg.  The induration is much better.  The pain has improved.  There is white and serous drainage coming from the middle portion of the incision however there is no fluctuance to palpation and there is no tenderness to palpation.  Therefore I do not believe that the staples need to be removed today.  She has an additional 3 days of antibiotics to carry her through the weekend.  However her INR has dropped precipitously from 2.5 down to 1.4.  At that time, my plan was: Cellulitis seems to be improved.  I will recheck this Monday after she is completed antibiotics.  If it still draining from the wound I would recommend removing the middle 2 staples and probing the wound for any collection of pus that may be persistent however it appears to have improved on antibiotics.  I am concerned by the drop in her INR.  I am not certain if that is due to antibiotics or if that could be due to the chemotherapy.  Therefore I  recommended that she take an additional 5 mg of Coumadin today and then increase her normal routine to 7.5 mg every day.  We will recheck her INR next week  09/28/18 Here for recheck.  The redness has improved around the wound on her right leg.  There is now a pinkish color directly adjacent to the operative site but noes evidence of spreading cellulitis.  It is slightly warmer to the touch than the surrounding area but it is not painful.  It is not fiery red.  The patient denies any tenderness at all.  There is serous drainage coming from the center of the wound where the skin has dehisced slightly however there is no pus that I can express from that area.  Patient has reported some mild dysuria since starting the antibiotics and is also having some pelvic itching.  I suspect a yeast infection.  Her INR today has improved to 1.6.  Obviously this is too soon to check an INR after making a change just 4 days ago.  Therefore I do believe the 7.5 will be the correct/appropriate dose.  However I also want to try to get her to therapeutic as quick as possible.  She would also like to check her cholesterol while she is here.  At that time, my plan was: I will screen the patient's cholesterol today and also check a CBC and a CMP per her request.  INR is subtherapeutic therefore I recommended that she bolused 2 mg of Coumadin today, 10 mg of Coumadin tomorrow and then maintain at 7.5 mg of Coumadin daily thereafter.  Recheck INR in 10 days to make gross adjustment if necessary.  Cellulitis seems to be improving.  She has 1 day left of Keflex.  I see no residual infection today.  I gave the patient warning signs of recurrent infection and when to come back but otherwise finished antibiotic and no further follow-up is necessary for that unless warning signs develop.  I suspect her dysuria is likely due to a yeast infection however I will check a urinalysis.  If negative I would recommend trying Diflucan for a yeast  infection  10/08/18 Patient is here today to recheck her INR.  INR is still subtherapeutic at 1.6.  Patient is taking 7.5 mg of Coumadin every day.  At her last visit, her hemoglobin had dropped from 10.6-8.6.  I believed this to be due to her  chemotherapy and bone marrow suppression.  However I was concerned due to the fact she is taking Coumadin and therefore I recommended a stool test to rule out bleeding.  Patient has yet to bring back a stool sample.  She denies any melena or hematochezia.  She denies any abdominal pain.  She is scheduled for chemo next week.  She is now 3 weeks out from chemo and therefore I would expect that this should have improved. Past Medical History:  Diagnosis Date   Anxiety    Arterial thrombosis (HCC)    right sfa, popliteal artery.  Require thromectomy at [facility] and lifelong anticoagulation.   Arthritis    Cancer ([place])    ovarian, recurrent on chemo (carboplatin, doxorubicin) at [facility]   Chest pain    Colon polyp    Constipation    Depression    Full dentures    GERD (gastroesophageal reflux disease)    Hemorrhoid    HLD (hyperlipidemia) 12/03/2012   Hyperlipidemia    Hypertension    Hypothyroidism    IBS (irritable bowel syndrome)    Neck mass    Neuropathy    feet   Numbness and tingling in hands    Numbness in both legs    Palpitations    Schatzki's ring    Trouble swallowing    Wears glasses    Past Surgical History:  Procedure Laterality Date   ABDOMINAL HYSTERECTOMY     ABLATION     COLONOSCOPY     CYST REMOVAL NECK  05/13/12   EAR CYST EXCISION  05/13/2012   Procedure: CYST REMOVAL;  Surgeon: Ralene Ok, MD;  Location: [facility];  Service: General;  Laterality: Left;  excision of neck cyst   THYROID SURGERY  10/2011 - approximate   ablation   TONSILLECTOMY     UPPER GI ENDOSCOPY     Current Outpatient Medications on File Prior to Visit  Medication Sig Dispense Refill   amLODipine (NORVASC) 10 MG  tablet Take 1 tablet (10 mg total) by mouth daily. 90 tablet 0   cephALEXin (KEFLEX) 500 MG capsule Take 1 capsule (500 mg total) by mouth 4 (four) times daily. 28 capsule 0   colchicine 0.6 MG tablet Take 1 tablet (0.6 mg total) by mouth 2 (two) times daily as needed. 60 tablet 6   dexamethasone (DECADRON) 4 MG tablet Take 2 tablets (8 mg) by mouth daily with breakfast for 3 days after chemotherapy each cycle.  Do not take prednisone on the days you take dexamethasone.     diphenhydrAMINE (BENADRYL) 25 MG tablet Take 25 mg by mouth at bedtime as needed. For sleep     famotidine (PEPCID) 20 MG tablet Take by mouth.     gabapentin (NEURONTIN) 300 MG capsule Take 1 capsule (300 mg total) by mouth at bedtime. (Patient taking differently: Take 300 mg by mouth 3 (three) times daily. ) 30 capsule 0   glucosamine-chondroitin 500-400 MG tablet Take 1 tablet by mouth daily.     hydrochlorothiazide (MICROZIDE) 12.5 MG capsule Take 1 capsule by mouth once daily 90 capsule 0   levothyroxine (SYNTHROID, LEVOTHROID) 150 MCG tablet Take 1 tablet (150 mcg total) by mouth daily. 90 tablet 3   OLANZapine (ZYPREXA) 5 MG tablet Take 1 tablet by mouth nightly for 4 nights, starting on the day of chemotherapy each cycle.     ondansetron (ZOFRAN) 8 MG tablet Take by mouth.     oxyCODONE (OXY IR/ROXICODONE) 5 MG immediate release tablet  TAKE 1 TO 2 TABLETS BY MOUTH EVERY 6 HOURS AS NEEDED FOR PAIN FOR UP TO 5 DAYS     potassium chloride SA (K-DUR,KLOR-CON) 20 MEQ tablet Take 1 tablet (20 mEq total) by mouth daily. 90 tablet 3   predniSONE (DELTASONE) 20 MG tablet Take 1 tablet (20 mg total) by mouth daily with breakfast. 30 tablet 0   simvastatin (ZOCOR) 20 MG tablet TAKE 1 TABLET BY MOUTH AT BEDTIME 90 tablet 0   vitamin B-12 (CYANOCOBALAMIN) 1000 MCG tablet Take 1,000 mcg by mouth daily.     warfarin (COUMADIN) 5 MG tablet Take 1.5 tablets (7.5 mg total) by mouth daily for 30 days. 135 tablet 3   No  current facility-administered medications on file prior to visit.    Allergies  Allergen Reactions   Paclitaxel Other (See Comments)    Flushing, Itching, Throat tightness   Social History   Socioeconomic History   Marital status: Single    Spouse name: Not on file   Number of children: Not on file   Years of education: Not on file   Highest education level: Not on file  Occupational History   Not on file  Social Needs   Financial resource strain: Not on file   Food insecurity:    Worry: Not on file    Inability: Not on file   Transportation needs:    Medical: Not on file    Non-medical: Not on file  Tobacco Use   Smoking status: Former Smoker    Years: 32.00    Last attempt to quit: 06/19/2011    Years since quitting: 7.3   Smokeless tobacco: Never Used  Substance and Sexual Activity   Alcohol use: No   Drug use: No   Sexual activity: Yes  Lifestyle   Physical activity:    Days per week: Not on file    Minutes per session: Not on file   Stress: Not on file  Relationships   Social connections:    Talks on phone: Not on file    Gets together: Not on file    Attends religious service: Not on file    Active member of club or organization: Not on file    Attends meetings of clubs or organizations: Not on file    Relationship status: Not on file   Intimate partner violence:    Fear of current or ex partner: Not on file    Emotionally abused: Not on file    Physically abused: Not on file    Forced sexual activity: Not on file  Other Topics Concern   Not on file  Social History Narrative   Not on file     Review of Systems  All other systems reviewed and are negative.      Objective:   Physical Exam Vitals signs reviewed.  Constitutional:      General: She is not in acute distress.    Appearance: Normal appearance. She is not ill-appearing or toxic-appearing.  Cardiovascular:     Rate and Rhythm: Normal rate and regular rhythm.      Pulses: Normal pulses.     Heart sounds: Normal heart sounds. No murmur. No friction rub. No gallop.   Pulmonary:     Effort: Pulmonary effort is normal. No respiratory distress.     Breath sounds: Normal breath sounds. No stridor. No wheezing, rhonchi or rales.  Skin:    Findings: No erythema.  Neurological:     Mental Status: She is  alert.          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  Take 7.5 mg on Tuesday, Thursday, Sunday.  Recheck INR in 1 week.  Recheck CBC to monitor for further decline in her hemoglobin.  Although I believe the drop in her hemoglobin was likely bone marrow suppression secondary to her chemotherapy I do want to rule out an occult GI bleed given the fact that she is on Coumadin.  I will also check a fecal occult stool card as well.  If stool card is negative and hemoglobin is stable I feel no further work-up is necessary.  Patient notified her oncologist and per her report they felt that this was also likely the situation

## 2018-10-10 ENCOUNTER — Other Ambulatory Visit: Payer: Self-pay | Admitting: Family Medicine

## 2018-10-18 ENCOUNTER — Ambulatory Visit (INDEPENDENT_AMBULATORY_CARE_PROVIDER_SITE_OTHER): Payer: Managed Care, Other (non HMO) | Admitting: Family Medicine

## 2018-10-18 ENCOUNTER — Other Ambulatory Visit: Payer: Self-pay

## 2018-10-18 ENCOUNTER — Encounter: Payer: Self-pay | Admitting: Family Medicine

## 2018-10-18 DIAGNOSIS — I743 Embolism and thrombosis of arteries of the lower extremities: Secondary | ICD-10-CM | POA: Diagnosis not present

## 2018-10-18 LAB — PT WITH INR/FINGERSTICK
INR, fingerstick: 3 ratio — ABNORMAL HIGH
PT, fingerstick: 35.9 s — ABNORMAL HIGH (ref 10.5–13.1)

## 2018-10-18 NOTE — Progress Notes (Signed)
   Subjective:    Patient ID: Donna [name], female    DOB: 03/13/1961, 58 y.o.   MRN: [id number]  Patient presents for INR/PT Check (Coumadin - 10 mg m/w//f/sat - 7.5 mg sun/t/thurs)  Here for INR check.  She has history of arterial thrombosis.  She is currently on Coumadin therapy.  INR goal between 2 and 3.  She is also being treated for ovarian cancer by oncology.  She has been taking her Coumadin as prescribed she is not had any abnormal bleeding or significant bruising.  She does tell me however she has been referred by her rheumatologist at [facility] to GI she has had imaging of her chest area and there was concern for some thickening along her esophagus.  She has a known Schatzki's ring.  She does have history of dysphasia and some difficulty swallowing through the years.  She has a telephone visit June 5 to establish with a GI they will discuss EGD.  She will let us know when this may occur so that we can hold her Coumadin for possible biopsy  No new concerns    Review Of Systems:  GEN- +fatigue,denies  fever, weight loss,weakness, recent illness HEENT- denies eye drainage, change in vision, nasal discharge, CVS- denies chest pain, palpitations RESP- denies SOB, cough, wheeze ABD- denies N/V, change in stools, abd pain GU- denies dysuria, hematuria, dribbling, incontinence MSK- denies joint pain, muscle aches, injury Neuro- denies headache, dizziness, syncope, seizure activity       Objective:    BP 126/72   Pulse 60   Temp 98 F (36.7 C) (Oral)   Resp 16   Ht 5' 7.5" (1.715 m)   Wt 252 lb (114.3 kg)   SpO2 98%   BMI 38.89 kg/m  GEN- NAD, alert and oriented x3 CVS- RRR, no murmur RESP-CTAB EXT- No edema Skin in tact- minimal bruising on right forearm Pulses- Radial  2+        Assessment & Plan:      Problem List Items Addressed This Visit    None    Visit Diagnoses    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 Any transcriptional errors that result from this process are unintentional.

## 2018-10-19 ENCOUNTER — Encounter: Payer: Self-pay | Admitting: Family Medicine

## 2018-11-01 ENCOUNTER — Ambulatory Visit: Payer: Managed Care, Other (non HMO) | Admitting: Family Medicine

## 2018-11-01 ENCOUNTER — Other Ambulatory Visit: Payer: Self-pay

## 2018-11-01 VITALS — BP 138/78 | HR 66 | Temp 98.5°F

## 2018-11-01 DIAGNOSIS — I743 Embolism and thrombosis of arteries of the lower extremities: Secondary | ICD-10-CM

## 2018-11-01 DIAGNOSIS — I749 Embolism and thrombosis of unspecified artery: Secondary | ICD-10-CM | POA: Diagnosis not present

## 2018-11-01 LAB — PT WITH INR/FINGERSTICK
INR, fingerstick: 1.6 ratio — ABNORMAL HIGH
PT, fingerstick: 19.6 s — ABNORMAL HIGH (ref 10.5–13.1)

## 2018-11-01 MED ORDER — GABAPENTIN 300 MG PO CAPS: 300.0000 mg | ORAL_CAPSULE | Freq: Two times a day (BID) | ORAL | 11 refills | Status: DC

## 2018-11-01 NOTE — Progress Notes (Signed)
Subjective:    Patient ID: Donna [name], female    DOB: 1961-04-17, 58 y.o.   MRN: [id number]  HPI 09/09/18 Patient was originally scheduled to be seen today in the office for hospital follow-up.  However on intake, she explains that she has been exposed to a Dietitian who has had COVID-19.  She was notified of this by [facility] after she had left the hospital.  Therefore we asked the patient to go back out to the parking lot so that I could see her outside.  The laboratory technician wore appropriate personal protective equipment.  The point to today's visit was to recheck her INR.  She was discharged from the hospital on Sunday on Coumadin 5 mg tablets daily.  Today is day 4 and her INR as expected has not risen.  It is only 1.3 however she is only been on Coumadin for 4 days.  She is also taking Lovenox 120 mg subcu twice daily.  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  I have no hospital records to review.  She is also told that her cancer has returned.  This is the most likely cause of her DVT.  Due to the patient's quarantine for COVID exposure, I was unable to perform a physical exam.  We also did not take vital signs as we attempted to minimize contact.  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  I have no records to review.  INR is subtherapeutic.  I will increase her Coumadin to 7.5 mg daily.  She will take this today, Friday, Saturday, Sunday and we will see the patient back on Monday at 9 AM and recheck her INR.  We will discontinue Lovenox once INR is therapeutic.  09/13/18 Here today for follow up.  Has been on Coumadin 7.5 mg daily Friday, Saturday, Sunday.  I was able to locate her hospital records from April 14.  I have copied these records and included them below for my reference:  Surgeries Performed: RLE angiogram with SFA, popliteal artery,and tibial artery thrombectomies as well as VBX stent placement in  aorta   Brief History of Present Illness: Donna [name] a 38 y.o.femalewith ovarian cancer (on chemo, last dose of taxol 3/24), RA (on 20mg  Prednisone daily) HTN, HLD, and hypothyroid who presents with right foot pain and coolness.   Patient states she has been having intermittent right foot painfor about 7 to 10 days.However, the pain has progressively worsened over the last 1 to 2 days and is now constant and severe. She states that on the morning of presentation (4/14) she noted her right foot to be cooler than the left foot, which prompted her to seek medical attention at the cancer clinic. At the cancer clinic she reports having sensation of pins-and-needles. She has tried Tylenol and Advil with limited relief heat helps a little bit. She denies any skin rash or redness however notes cooler right lower extremity from about the shin mid shin down. She denies fevers, chills, headaches, changes in vision, loss of consciousness, lightheadedness, dizziness, chest pain, or shortness of breath.  In the ED they were unable to palpate a pulse or obtain a doppler signals in the right lower extremity prompting vascular consultation. Patient remains hemodynamically appropriate and afebrile.  Hospital Course: As above, Ms. Gracey presented on 08/31/2018. She was started on a heparin drip and she was optimized for surgery. As part of the  pre-op workup, a COVID screening was performed, and she was found to be negative. On 09/02/2018, she was taken to the operating room with Dr. Tobie Poet and Dr. Jimmye Norman for a RLE angiogram with placement of aortic stent and thrombectomies of the SFA, popliteal and tibial arteries. The procedure was without complication. Estimated blood loss was 145mL. Hemostasis was achieved prior to conclusion of the case. She remained stable throughout the operation and was transferred to the PACU and subsequently to a surgical stepdown unit for continued care. Post-operatively, they were  examined routinely by floor nursing and by the vascular surgery team. Heparin drip was resumed in the immediate post-op period, however, following surgery, the hematology team was consulted and recommendation for anticoagulation therapy to include Warfarin with a Lovenox bridge was made. She was transitioned from heparin drip to Warfarin and Lovenox on 09/06/2018.   During their hospitalization, she worked with physical therapy. They were cleared on 09/05/2018, with recommendation for rolling walker. Case management worked to facilitate delivery of the rolling walker prior to discharge.  Per patient request, a referral to establish care with [facility] rheumatology was made. Ms. Akopyan stated that she wished to transfer care from [facility] to [facility] for management of her previously diagnosed rheumatoid arthritis.   On 09/05/2018, out of an abundance of precaution, she was placed on contact and droplet isolation due to positive diagnoses among some of the unit nursing staff. Ms. Arbaugh was tested for Covid-19 on 09/06/2018 and no virus was detected.    INR today is 2.0 and therapeutic.  Given the sudden jump, I believe we need to reduce her Coumadin dose slightly to avoid a supratherapeutic INR in 1 week however I do believe she can discontinue Lovenox shots tomorrow.  Otherwise she is doing well with no concerns.  At that time, my plan was: Decrease Coumadin to 7.5 mg on Monday, Wednesday, Friday, and Saturday.  Take 5 mg of Coumadin on Tuesday, Thursday, Sunday.  Recheck INR on Monday.  Discontinue Lovenox shots tomorrow.  Patient has a hypercoagulable state most likely due to her cancer and require long-term Coumadin indefinitely given the fact she had an arterial thrombosis.  09/20/18 Here to recheck INR.  Patient's INR today is 2.5!.  I am very pleased with this.  She is currently taking 7.5 mg on Monday Wednesday and Friday and Saturday.  She is taking 5 mg of Coumadin on Tuesday, Thursday, and Sunday.  She is  now off the Lovenox.  However the surgical site where she had her thrombectomy has become tender and painful erythematous and indurated.  With a chaperone present, I examined the surgical site.  There is a vertical incision with staples in the right groin.  There is warm indurated painful erythematous skin surrounding the entire incision site.  The erythema extends out 6 cm horizontally from the incision site and 12 cm vertically.  It is firm and indurated but I do not appreciate any fluctuance.  It is tender to touch.  At that time, my plan was: Patient appears to be developing a secondary cellulitis.  Begin Keflex 500 mg 4 times daily for 7 days.  Recheck wound on Friday.  If still indurated firm and painful, we may remove the first 2 staples to see if there is any pus in the wound.  If improving on antibiotics however we will not perform this.  INR is therapeutic.  Continue Coumadin and his current dose and reassess INR in 4 weeks.  09/24/18  Since  taking the antibiotic, the redness has improved on her leg.  The induration is much better.  The pain has improved.  There is white and serous drainage coming from the middle portion of the incision however there is no fluctuance to palpation and there is no tenderness to palpation.  Therefore I do not believe that the staples need to be removed today.  She has an additional 3 days of antibiotics to carry her through the weekend.  However her INR has dropped precipitously from 2.5 down to 1.4.  At that time, my plan was: Cellulitis seems to be improved.  I will recheck this Monday after she is completed antibiotics.  If it still draining from the wound I would recommend removing the middle 2 staples and probing the wound for any collection of pus that may be persistent however it appears to have improved on antibiotics.  I am concerned by the drop in her INR.  I am not certain if that is due to antibiotics or if that could be due to the chemotherapy.  Therefore I  recommended that she take an additional 5 mg of Coumadin today and then increase her normal routine to 7.5 mg every day.  We will recheck her INR next week  09/28/18 Here for recheck.  The redness has improved around the wound on her right leg.  There is now a pinkish color directly adjacent to the operative site but noes evidence of spreading cellulitis.  It is slightly warmer to the touch than the surrounding area but it is not painful.  It is not fiery red.  The patient denies any tenderness at all.  There is serous drainage coming from the center of the wound where the skin has dehisced slightly however there is no pus that I can express from that area.  Patient has reported some mild dysuria since starting the antibiotics and is also having some pelvic itching.  I suspect a yeast infection.  Her INR today has improved to 1.6.  Obviously this is too soon to check an INR after making a change just 4 days ago.  Therefore I do believe the 7.5 will be the correct/appropriate dose.  However I also want to try to get her to therapeutic as quick as possible.  She would also like to check her cholesterol while she is here.  At that time, my plan was: I will screen the patient's cholesterol today and also check a CBC and a CMP per her request.  INR is subtherapeutic therefore I recommended that she bolused 2 mg of Coumadin today, 10 mg of Coumadin tomorrow and then maintain at 7.5 mg of Coumadin daily thereafter.  Recheck INR in 10 days to make gross adjustment if necessary.  Cellulitis seems to be improving.  She has 1 day left of Keflex.  I see no residual infection today.  I gave the patient warning signs of recurrent infection and when to come back but otherwise finished antibiotic and no further follow-up is necessary for that unless warning signs develop.  I suspect her dysuria is likely due to a yeast infection however I will check a urinalysis.  If negative I would recommend trying Diflucan for a yeast  infection  10/08/18 Patient is here today to recheck her INR.  INR is still subtherapeutic at 1.6.  Patient is taking 7.5 mg of Coumadin every day.  At her last visit, her hemoglobin had dropped from 10.6-8.6.  I believed this to be due to her  chemotherapy and bone marrow suppression.  However I was concerned due to the fact she is taking Coumadin and therefore I recommended a stool test to rule out bleeding.  Patient has yet to bring back a stool sample.  She denies any melena or hematochezia.  She denies any abdominal pain.  She is scheduled for chemo next week.  She is now 3 weeks out from chemo and therefore I would expect that this should have improved.  At that time, my plan was: Increase Coumadin to 10 mg on Monday, Wednesday, Friday, and Saturday.  Take 7.5 mg on Tuesday, Thursday, Sunday.  Recheck INR in 1 week.  Recheck CBC to monitor for further decline in her hemoglobin.  Although I believe the drop in her hemoglobin was likely bone marrow suppression secondary to her chemotherapy I do want to rule out an occult GI bleed given the fact that she is on Coumadin.  I will also check a fecal occult stool card as well.  If stool card is negative and hemoglobin is stable I feel no further work-up is necessary.  Patient notified her oncologist and per her report they felt that this was also likely the situation  11/01/18 INR was 3 on 6/1.  INR today is subtherapeutic at 1.6.  She is currently on 10 mg of Coumadin on Wednesday, Wednesday, Friday, and Saturday.  She takes 7.5 mg of Coumadin on Tuesday, Thursday, Sunday.  She is also asking for refill on her gabapentin.  She uses twice a day for nerve pain and neuropathy in her legs Past Medical History:  Diagnosis Date   Anxiety    Arterial thrombosis (HCC)    right sfa, popliteal artery.  Require thromectomy at [facility] and lifelong anticoagulation.   Arthritis    Cancer ([place])    ovarian, recurrent on chemo (carboplatin, doxorubicin) at [facility]    Chest pain    Colon polyp    Constipation    Depression    Full dentures    GERD (gastroesophageal reflux disease)    Hemorrhoid    HLD (hyperlipidemia) 12/03/2012   Hyperlipidemia    Hypertension    Hypothyroidism    IBS (irritable bowel syndrome)    Neck mass    Neuropathy    feet   Numbness and tingling in hands    Numbness in both legs    Palpitations    Schatzki's ring    Trouble swallowing    Wears glasses    Past Surgical History:  Procedure Laterality Date   ABDOMINAL HYSTERECTOMY     ABLATION     COLONOSCOPY     CYST REMOVAL NECK  05/13/12   EAR CYST EXCISION  05/13/2012   Procedure: CYST REMOVAL;  Surgeon: Ralene Ok, MD;  Location: [place];  Service: General;  Laterality: Left;  excision of neck cyst   THYROID SURGERY  10/2011 - approximate   ablation   TONSILLECTOMY     UPPER GI ENDOSCOPY     Current Outpatient Medications on File Prior to Visit  Medication Sig Dispense Refill   amLODipine (NORVASC) 10 MG tablet Take 1 tablet by mouth once daily 90 tablet 3   dexamethasone (DECADRON) 4 MG tablet Take 2 tablets (8 mg) by mouth daily with breakfast for 3 days after chemotherapy each cycle.  Do not take prednisone on the days you take dexamethasone.     diphenhydrAMINE (BENADRYL) 25 MG tablet Take 25 mg by mouth at bedtime as needed. For sleep  famotidine (PEPCID) 20 MG tablet Take by mouth.     gabapentin (NEURONTIN) 300 MG capsule Take 1 capsule (300 mg total) by mouth at bedtime. (Patient taking differently: Take 300 mg by mouth 3 (three) times daily. ) 30 capsule 0   hydrochlorothiazide (MICROZIDE) 12.5 MG capsule Take 1 capsule by mouth once daily 90 capsule 0   levothyroxine (SYNTHROID, LEVOTHROID) 150 MCG tablet Take 1 tablet (150 mcg total) by mouth daily. 90 tablet 3   OLANZapine (ZYPREXA) 5 MG tablet Take 1 tablet by mouth nightly for 4 nights, starting on the day of chemotherapy each cycle.     ondansetron  (ZOFRAN) 8 MG tablet Take by mouth.     oxyCODONE (OXY IR/ROXICODONE) 5 MG immediate release tablet TAKE 1 TO 2 TABLETS BY MOUTH EVERY 6 HOURS AS NEEDED FOR PAIN FOR UP TO 5 DAYS     potassium chloride SA (K-DUR,KLOR-CON) 20 MEQ tablet Take 1 tablet (20 mEq total) by mouth daily. 90 tablet 3   predniSONE (DELTASONE) 20 MG tablet Take 1 tablet (20 mg total) by mouth daily with breakfast. (Patient taking differently: Take 17.5 mg by mouth daily with breakfast. Tapering down dose over next 26 weeks) 30 tablet 0   simvastatin (ZOCOR) 20 MG tablet TAKE 1 TABLET BY MOUTH AT BEDTIME 90 tablet 0   vitamin B-12 (CYANOCOBALAMIN) 1000 MCG tablet Take 1,000 mcg by mouth daily.     warfarin (COUMADIN) 5 MG tablet Take 1.5 tablets (7.5 mg total) by mouth daily for 30 days. (Patient taking differently: Take 7.5 mg by mouth daily. 10 mg m/w//f/sat - 7.5 mg sun/t/thurs) 135 tablet 3   No current facility-administered medications on file prior to visit.    Allergies  Allergen Reactions   Paclitaxel Other (See Comments)    Flushing, Itching, Throat tightness   Social History   Socioeconomic History   Marital status: Single    Spouse name: Not on file   Number of children: Not on file   Years of education: Not on file   Highest education level: Not on file  Occupational History   Not on file  Social Needs   Financial resource strain: Not on file   Food insecurity    Worry: Not on file    Inability: Not on file   Transportation needs    Medical: Not on file    Non-medical: Not on file  Tobacco Use   Smoking status: Former Smoker    Years: 32.00    Quit date: 06/19/2011    Years since quitting: 7.3   Smokeless tobacco: Never Used  Substance and Sexual Activity   Alcohol use: No   Drug use: No   Sexual activity: Yes  Lifestyle   Physical activity    Days per week: Not on file    Minutes per session: Not on file   Stress: Not on file  Relationships   Social connections      Talks on phone: Not on file    Gets together: Not on file    Attends religious service: Not on file    Active member of club or organization: Not on file    Attends meetings of clubs or organizations: Not on file    Relationship status: Not on file   Intimate partner violence    Fear of current or ex partner: Not on file    Emotionally abused: Not on file    Physically abused: Not on file    Forced sexual activity:  Not on file  Other Topics Concern   Not on file  Social History Narrative   Not on file     Review of Systems  All other systems reviewed and are negative.      Objective:   Physical Exam Vitals signs reviewed.  Constitutional:      General: She is not in acute distress.    Appearance: Normal appearance. She is not ill-appearing or toxic-appearing.  Cardiovascular:     Rate and Rhythm: Normal rate and regular rhythm.     Pulses: Normal pulses.     Heart sounds: Normal heart sounds. No murmur. No friction rub. No gallop.   Pulmonary:     Effort: Pulmonary effort is normal. No respiratory distress.     Breath sounds: Normal breath sounds. No stridor. No wheezing, rhonchi or rales.  Skin:    Findings: No erythema.  Neurological:     Mental Status: She is alert.          Assessment & Plan:  1. 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 Arterial thrombosis (HCC) - PT with INR/Fingerstick INR is subtherapeutic.  Increase Coumadin to 10 mg on all days except Monday and Friday.  On Monday and Friday take 7.5 mg.  Recheck INR in 2 weeks.  Recent hemoglobin checked at [facility] was up to 10.6.  Therefore the drop in her hemoglobin was due to bone marrow suppression from her chemotherapy.

## 2018-11-11 ENCOUNTER — Other Ambulatory Visit: Payer: Self-pay

## 2018-11-11 ENCOUNTER — Ambulatory Visit (INDEPENDENT_AMBULATORY_CARE_PROVIDER_SITE_OTHER): Payer: Managed Care, Other (non HMO) | Admitting: Family Medicine

## 2018-11-11 ENCOUNTER — Encounter: Payer: Self-pay | Admitting: Family Medicine

## 2018-11-11 DIAGNOSIS — I743 Embolism and thrombosis of arteries of the lower extremities: Secondary | ICD-10-CM

## 2018-11-11 LAB — PT WITH INR/FINGERSTICK
INR, fingerstick: 2.5 ratio — ABNORMAL HIGH
PT, fingerstick: 29.6 s — ABNORMAL HIGH (ref 10.5–13.1)

## 2018-11-11 NOTE — Progress Notes (Signed)
Subjective:    Patient ID: Donna [name], female    DOB: [birth year], 58 y.o.   MRN: [id number]  HPI  09/09/18 Patient was originally scheduled to be seen today in the office for hospital follow-up.  However on intake, she explains that she has been exposed to a Dietitian who has had COVID-19.  She was notified of this by [facility] after she had left the hospital.  Therefore we asked the patient to go back out to the parking lot so that I could see her outside.  The laboratory technician wore appropriate personal protective equipment.  The point to today's visit was to recheck her INR.  She was discharged from the hospital on Sunday on Coumadin 5 mg tablets daily.  Today is day 4 and her INR as expected has not risen.  It is only 1.3 however she is only been on Coumadin for 4 days.  She is also taking Lovenox 120 mg subcu twice daily.  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  I have no hospital records to review.  She is also told that her cancer has returned.  This is the most likely cause of her DVT.  Due to the patient's quarantine for COVID exposure, I was unable to perform a physical exam.  We also did not take vital signs as we attempted to minimize contact.  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  I have no records to review.  INR is subtherapeutic.  I will increase her Coumadin to 7.5 mg daily.  She will take this today, Friday, Saturday, Sunday and we will see the patient back on Monday at 9 AM and recheck her INR.  We will discontinue Lovenox once INR is therapeutic.  09/13/18 Here today for follow up.  Has been on Coumadin 7.5 mg daily Friday, Saturday, Sunday.  I was able to locate her hospital records from April 14.  I have copied these records and included them below for my reference:  Surgeries Performed: RLE angiogram with SFA, popliteal artery,and tibial artery thrombectomies as well as VBX stent placement in  aorta   Brief History of Present Illness: Donna [name] a 38 y.o.femalewith ovarian cancer (on chemo, last dose of taxol 3/24), RA (on 20mg  Prednisone daily) HTN, HLD, and hypothyroid who presents with right foot pain and coolness.   Patient states she has been having intermittent right foot painfor about 7 to 10 days.However, the pain has progressively worsened over the last 1 to 2 days and is now constant and severe. She states that on the morning of presentation (4/14) she noted her right foot to be cooler than the left foot, which prompted her to seek medical attention at the cancer clinic. At the cancer clinic she reports having sensation of pins-and-needles. She has tried Tylenol and Advil with limited relief heat helps a little bit. She denies any skin rash or redness however notes cooler right lower extremity from about the shin mid shin down. She denies fevers, chills, headaches, changes in vision, loss of consciousness, lightheadedness, dizziness, chest pain, or shortness of breath.  In the ED they were unable to palpate a pulse or obtain a doppler signals in the right lower extremity prompting vascular consultation. Patient remains hemodynamically appropriate and afebrile.  Hospital Course: As above, Ms. Gade presented on 08/31/2018. She was started on a heparin drip and she was optimized for surgery. As part of  the pre-op workup, a COVID screening was performed, and she was found to be negative. On 09/02/2018, she was taken to the operating room with Dr. Tobie Poet and Dr. Jimmye Norman for a RLE angiogram with placement of aortic stent and thrombectomies of the SFA, popliteal and tibial arteries. The procedure was without complication. Estimated blood loss was 139mL. Hemostasis was achieved prior to conclusion of the case. She remained stable throughout the operation and was transferred to the PACU and subsequently to a surgical stepdown unit for continued care. Post-operatively, they were  examined routinely by floor nursing and by the vascular surgery team. Heparin drip was resumed in the immediate post-op period, however, following surgery, the hematology team was consulted and recommendation for anticoagulation therapy to include Warfarin with a Lovenox bridge was made. She was transitioned from heparin drip to Warfarin and Lovenox on 09/06/2018.   During their hospitalization, she worked with physical therapy. They were cleared on 09/05/2018, with recommendation for rolling walker. Case management worked to facilitate delivery of the rolling walker prior to discharge.  Per patient request, a referral to establish care with [facility] rheumatology was made. Ms. Castronovo stated that she wished to transfer care from [facility] health to [facility] for management of her previously diagnosed rheumatoid arthritis.   On 09/05/2018, out of an abundance of precaution, she was placed on contact and droplet isolation due to positive diagnoses among some of the unit nursing staff. Ms. Killilea was tested for Covid-19 on 09/06/2018 and no virus was detected.    INR today is 2.0 and therapeutic.  Given the sudden jump, I believe we need to reduce her Coumadin dose slightly to avoid a supratherapeutic INR in 1 week however I do believe she can discontinue Lovenox shots tomorrow.  Otherwise she is doing well with no concerns.  At that time, my plan was: Decrease Coumadin to 7.5 mg on Monday, Wednesday, Friday, and Saturday.  Take 5 mg of Coumadin on Tuesday, Thursday, Sunday.  Recheck INR on Monday.  Discontinue Lovenox shots tomorrow.  Patient has a hypercoagulable state most likely due to her cancer and require long-term Coumadin indefinitely given the fact she had an arterial thrombosis.  09/20/18 Here to recheck INR.  Patient's INR today is 2.5!.  I am very pleased with this.  She is currently taking 7.5 mg on Monday Wednesday and Friday and Saturday.  She is taking 5 mg of Coumadin on Tuesday, Thursday, and Sunday.  She is  now off the Lovenox.  However the surgical site where she had her thrombectomy has become tender and painful erythematous and indurated.  With a chaperone present, I examined the surgical site.  There is a vertical incision with staples in the right groin.  There is warm indurated painful erythematous skin surrounding the entire incision site.  The erythema extends out 6 cm horizontally from the incision site and 12 cm vertically.  It is firm and indurated but I do not appreciate any fluctuance.  It is tender to touch.  At that time, my plan was: Patient appears to be developing a secondary cellulitis.  Begin Keflex 500 mg 4 times daily for 7 days.  Recheck wound on Friday.  If still indurated firm and painful, we may remove the first 2 staples to see if there is any pus in the wound.  If improving on antibiotics however we will not perform this.  INR is therapeutic.  Continue Coumadin and his current dose and reassess INR in 4 weeks.  09/24/18  Since taking the antibiotic, the redness has improved on her leg.  The induration is much better.  The pain has improved.  There is white and serous drainage coming from the middle portion of the incision however there is no fluctuance to palpation and there is no tenderness to palpation.  Therefore I do not believe that the staples need to be removed today.  She has an additional 3 days of antibiotics to carry her through the weekend.  However her INR has dropped precipitously from 2.5 down to 1.4.  At that time, my plan was: Cellulitis seems to be improved.  I will recheck this Monday after she is completed antibiotics.  If it still draining from the wound I would recommend removing the middle 2 staples and probing the wound for any collection of pus that may be persistent however it appears to have improved on antibiotics.  I am concerned by the drop in her INR.  I am not certain if that is due to antibiotics or if that could be due to the chemotherapy.  Therefore I  recommended that she take an additional 5 mg of Coumadin today and then increase her normal routine to 7.5 mg every day.  We will recheck her INR next week  09/28/18 Here for recheck.  The redness has improved around the wound on her right leg.  There is now a pinkish color directly adjacent to the operative site but noes evidence of spreading cellulitis.  It is slightly warmer to the touch than the surrounding area but it is not painful.  It is not fiery red.  The patient denies any tenderness at all.  There is serous drainage coming from the center of the wound where the skin has dehisced slightly however there is no pus that I can express from that area.  Patient has reported some mild dysuria since starting the antibiotics and is also having some pelvic itching.  I suspect a yeast infection.  Her INR today has improved to 1.6.  Obviously this is too soon to check an INR after making a change just 4 days ago.  Therefore I do believe the 7.5 will be the correct/appropriate dose.  However I also want to try to get her to therapeutic as quick as possible.  She would also like to check her cholesterol while she is here.  At that time, my plan was: I will screen the patient's cholesterol today and also check a CBC and a CMP per her request.  INR is subtherapeutic therefore I recommended that she bolused 2 mg of Coumadin today, 10 mg of Coumadin tomorrow and then maintain at 7.5 mg of Coumadin daily thereafter.  Recheck INR in 10 days to make gross adjustment if necessary.  Cellulitis seems to be improving.  She has 1 day left of Keflex.  I see no residual infection today.  I gave the patient warning signs of recurrent infection and when to come back but otherwise finished antibiotic and no further follow-up is necessary for that unless warning signs develop.  I suspect her dysuria is likely due to a yeast infection however I will check a urinalysis.  If negative I would recommend trying Diflucan for a yeast  infection  10/08/18 Patient is here today to recheck her INR.  INR is still subtherapeutic at 1.6.  Patient is taking 7.5 mg of Coumadin every day.  At her last visit, her hemoglobin had dropped from 10.6-8.6.  I believed this to be due to  her chemotherapy and bone marrow suppression.  However I was concerned due to the fact she is taking Coumadin and therefore I recommended a stool test to rule out bleeding.  Patient has yet to bring back a stool sample.  She denies any melena or hematochezia.  She denies any abdominal pain.  She is scheduled for chemo next week.  She is now 3 weeks out from chemo and therefore I would expect that this should have improved.  At that time, my plan was: Increase Coumadin to 10 mg on Monday, Wednesday, Friday, and Saturday.  Take 7.5 mg on Tuesday, Thursday, Sunday.  Recheck INR in 1 week.  Recheck CBC to monitor for further decline in her hemoglobin.  Although I believe the drop in her hemoglobin was likely bone marrow suppression secondary to her chemotherapy I do want to rule out an occult GI bleed given the fact that she is on Coumadin.  I will also check a fecal occult stool card as well.  If stool card is negative and hemoglobin is stable I feel no further work-up is necessary.  Patient notified her oncologist and per her report they felt that this was also likely the situation  11/01/18 INR was 3 on 6/1.  INR today is subtherapeutic at 1.6.  She is currently on 10 mg of Coumadin on Wednesday, Wednesday, Friday, and Saturday.  She takes 7.5 mg of Coumadin on Tuesday, Thursday, Sunday.  She is also asking for refill on her gabapentin.  She uses twice a day for nerve pain and neuropathy in her legs.  At that time, my plan was: INR is subtherapeutic.  Increase Coumadin to 10 mg on all days except Monday and Friday.  On Monday and Friday take 7.5 mg.  Recheck INR in 2 weeks.  Recent hemoglobin checked at [facility] was up to 10.6.  Therefore the drop in her hemoglobin was due to  bone marrow suppression from her chemotherapy.   11/11/18 INR today is therapeutic at 2.5.  Patient is currently taking 10 mg of Coumadin all days except Monday and Friday.  On Monday and Friday she is taking 7.5 mg.  Overall she is doing well with no concerns.  They are slowly weaning her down off her prednisone.  She is due for her next chemotherapy treatment in 4 weeks. Past Medical History:  Diagnosis Date   Anxiety    Arterial thrombosis (HCC)    right sfa, popliteal artery.  Require thromectomy at [facility] and lifelong anticoagulation.   Arthritis    Cancer ([place])    ovarian, recurrent on chemo (carboplatin, doxorubicin) at [facility]   Chest pain    Colon polyp    Constipation    Depression    Full dentures    GERD (gastroesophageal reflux disease)    Hemorrhoid    HLD (hyperlipidemia) 12/03/2012   Hyperlipidemia    Hypertension    Hypothyroidism    IBS (irritable bowel syndrome)    Neck mass    Neuropathy    feet   Numbness and tingling in hands    Numbness in both legs    Palpitations    Schatzki's ring    Trouble swallowing    Wears glasses    Past Surgical History:  Procedure Laterality Date   ABDOMINAL HYSTERECTOMY     ABLATION     COLONOSCOPY     CYST REMOVAL NECK  05/13/12   EAR CYST EXCISION  05/13/2012   Procedure: CYST REMOVAL;  Surgeon: Ralene Ok,  MD;  Location: [place];  Service: General;  Laterality: Left;  excision of neck cyst   THYROID SURGERY  10/2011 - approximate   ablation   TONSILLECTOMY     UPPER GI ENDOSCOPY     Current Outpatient Medications on File Prior to Visit  Medication Sig Dispense Refill   amLODipine (NORVASC) 10 MG tablet Take 1 tablet by mouth once daily 90 tablet 3   atorvastatin (LIPITOR) 80 MG tablet Take 80 mg by mouth daily.     dexamethasone (DECADRON) 4 MG tablet Take 2 tablets (8 mg) by mouth daily with breakfast for 3 days after chemotherapy each cycle.  Do not take prednisone on the days  you take dexamethasone.     diphenhydrAMINE (BENADRYL) 25 MG tablet Take 25 mg by mouth at bedtime as needed. For sleep     gabapentin (NEURONTIN) 300 MG capsule Take 1 capsule (300 mg total) by mouth 2 (two) times daily. 60 capsule 11   hydrochlorothiazide (MICROZIDE) 12.5 MG capsule Take 1 capsule by mouth once daily 90 capsule 0   levothyroxine (SYNTHROID, LEVOTHROID) 150 MCG tablet Take 1 tablet (150 mcg total) by mouth daily. 90 tablet 3   OLANZapine (ZYPREXA) 5 MG tablet Take 1 tablet by mouth nightly for 4 nights, starting on the day of chemotherapy each cycle.     ondansetron (ZOFRAN) 8 MG tablet Take by mouth.     potassium chloride SA (K-DUR,KLOR-CON) 20 MEQ tablet Take 1 tablet (20 mEq total) by mouth daily. 90 tablet 3   predniSONE (DELTASONE) 20 MG tablet Take 1 tablet (20 mg total) by mouth daily with breakfast. (Patient taking differently: Take 15 mg by mouth daily with breakfast. Tapering down dose over next 26 weeks) 30 tablet 0   vitamin B-12 (CYANOCOBALAMIN) 1000 MCG tablet Take 1,000 mcg by mouth daily.     warfarin (COUMADIN) 5 MG tablet Take 1.5 tablets (7.5 mg total) by mouth daily for 30 days. (Patient taking differently: Take by mouth daily. 75mg  M/F 10mg  TWThSS) 135 tablet 3   No current facility-administered medications on file prior to visit.    Allergies  Allergen Reactions   Paclitaxel Other (See Comments)    Flushing, Itching, Throat tightness   Social History   Socioeconomic History   Marital status: Single    Spouse name: Not on file   Number of children: Not on file   Years of education: Not on file   Highest education level: Not on file  Occupational History   Not on file  Social Needs   Financial resource strain: Not on file   Food insecurity    Worry: Not on file    Inability: Not on file   Transportation needs    Medical: Not on file    Non-medical: Not on file  Tobacco Use   Smoking status: Former Smoker    Years:  32.00    Quit date: 06/19/2011    Years since quitting: 7.4   Smokeless tobacco: Never Used  Substance and Sexual Activity   Alcohol use: No   Drug use: No   Sexual activity: Yes  Lifestyle   Physical activity    Days per week: Not on file    Minutes per session: Not on file   Stress: Not on file  Relationships   Social connections    Talks on phone: Not on file    Gets together: Not on file    Attends religious service: Not on file  Active member of club or organization: Not on file    Attends meetings of clubs or organizations: Not on file    Relationship status: Not on file   Intimate partner violence    Fear of current or ex partner: Not on file    Emotionally abused: Not on file    Physically abused: Not on file    Forced sexual activity: Not on file  Other Topics Concern   Not on file  Social History Narrative   Not on file     Review of Systems  All other systems reviewed and are negative.      Objective:   Physical Exam Vitals signs reviewed.  Constitutional:      General: She is not in acute distress.    Appearance: Normal appearance. She is not ill-appearing or toxic-appearing.  Cardiovascular:     Rate and Rhythm: Normal rate and regular rhythm.     Pulses: Normal pulses.     Heart sounds: Normal heart sounds. No murmur. No friction rub. No gallop.   Pulmonary:     Effort: Pulmonary effort is normal. No respiratory distress.     Breath sounds: Normal breath sounds. No stridor. No wheezing, rhonchi or rales.  Skin:    Findings: No erythema.  Neurological:     Mental Status: She is alert.          Assessment & Plan:  1. 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 Arterial thrombosis (HCC) - PT with INR/Fingerstick INR today is therapeutic.  No changes in Coumadin dose and recheck INR in 4 weeks.

## 2018-11-22 ENCOUNTER — Other Ambulatory Visit: Payer: Self-pay | Admitting: Family Medicine

## 2018-12-07 ENCOUNTER — Other Ambulatory Visit: Payer: Self-pay | Admitting: Family Medicine

## 2018-12-08 ENCOUNTER — Other Ambulatory Visit: Payer: Self-pay

## 2018-12-09 ENCOUNTER — Encounter: Payer: Self-pay | Admitting: Family Medicine

## 2018-12-09 ENCOUNTER — Ambulatory Visit (INDEPENDENT_AMBULATORY_CARE_PROVIDER_SITE_OTHER): Payer: Managed Care, Other (non HMO) | Admitting: Family Medicine

## 2018-12-09 DIAGNOSIS — I743 Embolism and thrombosis of arteries of the lower extremities: Secondary | ICD-10-CM | POA: Diagnosis not present

## 2018-12-09 LAB — PT WITH INR/FINGERSTICK
INR, fingerstick: 3.9 ratio — ABNORMAL HIGH
PT, fingerstick: 47 s — ABNORMAL HIGH (ref 10.5–13.1)

## 2018-12-09 NOTE — Progress Notes (Signed)
Subjective:    Patient ID: Donna [name], female    DOB: 18-Jun-1960, 58 y.o.   MRN: [id number]  HPI 09/09/18 Patient was originally scheduled to be seen today in the office for hospital follow-up.  However on intake, she explains that she has been exposed to a Dietitian who has had COVID-19.  She was notified of this by [facility] after she had left the hospital.  Therefore we asked the patient to go back out to the parking lot so that I could see her outside.  The laboratory technician wore appropriate personal protective equipment.  The point to today's visit was to recheck her INR.  She was discharged from the hospital on Sunday on Coumadin 5 mg tablets daily.  Today is day 4 and her INR as expected has not risen.  It is only 1.3 however she is only been on Coumadin for 4 days.  She is also taking Lovenox 120 mg subcu twice daily.  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  I have no hospital records to review.  She is also told that her cancer has returned.  This is the most likely cause of her DVT.  Due to the patient's quarantine for COVID exposure, I was unable to perform a physical exam.  We also did not take vital signs as we attempted to minimize contact.  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  I have no records to review.  INR is subtherapeutic.  I will increase her Coumadin to 7.5 mg daily.  She will take this today, Friday, Saturday, Sunday and we will see the patient back on Monday at 9 AM and recheck her INR.  We will discontinue Lovenox once INR is therapeutic.  09/13/18 Here today for follow up.  Has been on Coumadin 7.5 mg daily Friday, Saturday, Sunday.  I was able to locate her hospital records from April 14.  I have copied these records and included them below for my reference:  Surgeries Performed: RLE angiogram with SFA, popliteal artery,and tibial artery thrombectomies as well as VBX stent placement in  aorta                                                                         Brief History of Present Illness: Donna [name] a 31 y.o.femalewith ovarian cancer (on chemo, last dose of taxol 3/24), RA (on 20mg  Prednisone daily) HTN, HLD, and hypothyroid who presents with right foot pain and coolness.   Patient states she has been having intermittent right foot painfor about 7 to 10 days.However, the pain has progressively worsened over the last 1 to 2 days and is now constant and severe. She states that on the morning of presentation (4/14) she noted her right foot to be cooler than the left foot, which prompted her to seek medical attention at the cancer clinic. At the cancer clinic she reports having sensation of pins-and-needles. She has tried Tylenol and Advil with limited relief heat helps a little bit. She denies any skin rash or redness however notes cooler right lower extremity from about the shin mid shin down. She denies fevers, chills, headaches, changes  in vision, loss of consciousness, lightheadedness, dizziness, chest pain, or shortness of breath.  In the ED they were unable to palpate a pulse or obtain a doppler signals in the right lower extremity prompting vascular consultation. Patient remains hemodynamically appropriate and afebrile.  Hospital Course: As above, Donna [name] presented on 08/31/2018. She was started on a heparin drip and she was optimized for surgery. As part of the pre-op workup, a COVID screening was performed, and she was found to be negative. On 09/02/2018, she was taken to the operating room with Dr. Tobie Poet and Dr. Jimmye Norman for a RLE angiogram with placement of aortic stent and thrombectomies of the SFA, popliteal and tibial arteries. The procedure was without complication. Estimated blood loss was 127mL. Hemostasis was achieved prior to conclusion of the case. She remained stable throughout  the operation and was transferred to the PACU and subsequently to a surgical stepdown unit for continued care. Post-operatively, they were examined routinely by floor nursing and by the vascular surgery team. Heparin drip was resumed in the immediate post-op period, however, following surgery, the hematology team was consulted and recommendation for anticoagulation therapy to include Warfarin with a Lovenox bridge was made. She was transitioned from heparin drip to Warfarin and Lovenox on 09/06/2018.   During their hospitalization, she worked with physical therapy. They were cleared on 09/05/2018, with recommendation for rolling walker. Case management worked to facilitate delivery of the rolling walker prior to discharge.  Per patient request, a referral to establish care with [facility] rheumatology was made. [name] stated that she wished to transfer care from [facility] health to [facility] for management of her previously diagnosed rheumatoid arthritis.   On 09/05/2018, out of an abundance of precaution, she was placed on contact and droplet isolation due to positive diagnoses among some of the unit nursing staff. [name] was tested for Covid-19 on 09/06/2018 and no virus was detected.    INR today is 2.0 and therapeutic.  Given the sudden jump, I believe we need to reduce her Coumadin dose slightly to avoid a supratherapeutic INR in 1 week however I do believe she can discontinue Lovenox shots tomorrow.  Otherwise she is doing well with no concerns.  At that time, my plan was: Decrease Coumadin to 7.5 mg on Monday, Wednesday, Friday, and Saturday.  Take 5 mg of Coumadin on Tuesday, Thursday, Sunday.  Recheck INR on Monday.  Discontinue Lovenox shots tomorrow.  Patient has a hypercoagulable state most likely due to her cancer and require long-term Coumadin indefinitely given the fact she had an arterial thrombosis.  09/20/18 Here to recheck INR.  Patient's INR today is 2.5!.  I am very pleased with this.  She is  currently taking 7.5 mg on Monday Wednesday and Friday and Saturday.  She is taking 5 mg of Coumadin on Tuesday, Thursday, and Sunday.  She is now off the Lovenox.  However the surgical site where she had her thrombectomy has become tender and painful erythematous and indurated.  With a chaperone present, I examined the surgical site.  There is a vertical incision with staples in the right groin.  There is warm indurated painful erythematous skin surrounding the entire incision site.  The erythema extends out 6 cm horizontally from the incision site and 12 cm vertically.  It is firm and indurated but I do not appreciate any fluctuance.  It is tender to touch.  At that time, my plan was: Patient appears to be developing a secondary cellulitis.  Begin  Keflex 500 mg 4 times daily for 7 days.  Recheck wound on Friday.  If still indurated firm and painful, we may remove the first 2 staples to see if there is any pus in the wound.  If improving on antibiotics however we will not perform this.  INR is therapeutic.  Continue Coumadin and his current dose and reassess INR in 4 weeks.  09/24/18  Since taking the antibiotic, the redness has improved on her leg.  The induration is much better.  The pain has improved.  There is white and serous drainage coming from the middle portion of the incision however there is no fluctuance to palpation and there is no tenderness to palpation.  Therefore I do not believe that the staples need to be removed today.  She has an additional 3 days of antibiotics to carry her through the weekend.  However her INR has dropped precipitously from 2.5 down to 1.4.  At that time, my plan was: Cellulitis seems to be improved.  I will recheck this Monday after she is completed antibiotics.  If it still draining from the wound I would recommend removing the middle 2 staples and probing the wound for any collection of pus that may be persistent however it appears to have improved on antibiotics.  I  am concerned by the drop in her INR.  I am not certain if that is due to antibiotics or if that could be due to the chemotherapy.  Therefore I recommended that she take an additional 5 mg of Coumadin today and then increase her normal routine to 7.5 mg every day.  We will recheck her INR next week  09/28/18 Here for recheck.  The redness has improved around the wound on her right leg.  There is now a pinkish color directly adjacent to the operative site but noes evidence of spreading cellulitis.  It is slightly warmer to the touch than the surrounding area but it is not painful.  It is not fiery red.  The patient denies any tenderness at all.  There is serous drainage coming from the center of the wound where the skin has dehisced slightly however there is no pus that I can express from that area.  Patient has reported some mild dysuria since starting the antibiotics and is also having some pelvic itching.  I suspect a yeast infection.  Her INR today has improved to 1.6.  Obviously this is too soon to check an INR after making a change just 4 days ago.  Therefore I do believe the 7.5 will be the correct/appropriate dose.  However I also want to try to get her to therapeutic as quick as possible.  She would also like to check her cholesterol while she is here.  At that time, my plan was: I will screen the patient's cholesterol today and also check a CBC and a CMP per her request.  INR is subtherapeutic therefore I recommended that she bolused 2 mg of Coumadin today, 10 mg of Coumadin tomorrow and then maintain at 7.5 mg of Coumadin daily thereafter.  Recheck INR in 10 days to make gross adjustment if necessary.  Cellulitis seems to be improving.  She has 1 day left of Keflex.  I see no residual infection today.  I gave the patient warning signs of recurrent infection and when to come back but otherwise finished antibiotic and no further follow-up is necessary for that unless warning signs develop.  I suspect her  dysuria is likely  due to a yeast infection however I will check a urinalysis.  If negative I would recommend trying Diflucan for a yeast infection  10/08/18 Patient is here today to recheck her INR.  INR is still subtherapeutic at 1.6.  Patient is taking 7.5 mg of Coumadin every day.  At her last visit, her hemoglobin had dropped from 10.6-8.6.  I believed this to be due to her chemotherapy and bone marrow suppression.  However I was concerned due to the fact she is taking Coumadin and therefore I recommended a stool test to rule out bleeding.  Patient has yet to bring back a stool sample.  She denies any melena or hematochezia.  She denies any abdominal pain.  She is scheduled for chemo next week.  She is now 3 weeks out from chemo and therefore I would expect that this should have improved.  At that time, my plan was: Increase Coumadin to 10 mg on Monday, Wednesday, Friday, and Saturday.  Take 7.5 mg on Tuesday, Thursday, Sunday.  Recheck INR in 1 week.  Recheck CBC to monitor for further decline in her hemoglobin.  Although I believe the drop in her hemoglobin was likely bone marrow suppression secondary to her chemotherapy I do want to rule out an occult GI bleed given the fact that she is on Coumadin.  I will also check a fecal occult stool card as well.  If stool card is negative and hemoglobin is stable I feel no further work-up is necessary.  Patient notified her oncologist and per her report they felt that this was also likely the situation  11/01/18 INR was 3 on 6/1.  INR today is subtherapeutic at 1.6.  She is currently on 10 mg of Coumadin on Wednesday, Wednesday, Friday, and Saturday.  She takes 7.5 mg of Coumadin on Tuesday, Thursday, Sunday.  She is also asking for refill on her gabapentin.  She uses twice a day for nerve pain and neuropathy in her legs.  At that time, my plan was: INR is subtherapeutic.  Increase Coumadin to 10 mg on all days except Monday and Friday.  On Monday and Friday take  7.5 mg.  Recheck INR in 2 weeks.  Recent hemoglobin checked at [facility] was up to 10.6.  Therefore the drop in her hemoglobin was due to bone marrow suppression from her chemotherapy.   11/11/18 INR today is therapeutic at 2.5.  Patient is currently taking 10 mg of Coumadin all days except Monday and Friday.  On Monday and Friday she is taking 7.5 mg.  Overall she is doing well with no concerns.  They are slowly weaning her down off her prednisone.  She is due for her next chemotherapy treatment in 4 weeks.  12/09/18 Here to recheck INR.  INR today is supratherapeutic at 3.9.  She has been taking 10 mg a day of Coumadin except Mondays and Fridays only she takes 7.5.  She denies any changes in her medication.  She does complain now of claudication in her right leg.  She describes cramping pain in her feet whenever she walks more than 100 feet.  This improves if she rest.  However she also reports burning pain in her right leg and numbness and tingling in her right leg.  She has a palpable femoral pulse today.  She has a palpable popliteal pulse today.  She has a palpable dorsalis pedis and posterior tibialis pulse today.  The extremity is warm and well-perfused.  Question is if this is  neurogenic claudication versus arterial claudication. Past Medical History:  Diagnosis Date   Anxiety    Arterial thrombosis (HCC)    right sfa, popliteal artery.  Require thromectomy at [facility] and lifelong anticoagulation.   Arthritis    Cancer ([place])    ovarian, recurrent on chemo (carboplatin, doxorubicin) at [facility]   Chest pain    Colon polyp    Constipation    Depression    Full dentures    GERD (gastroesophageal reflux disease)    Hemorrhoid    HLD (hyperlipidemia) 12/03/2012   Hyperlipidemia    Hypertension    Hypothyroidism    IBS (irritable bowel syndrome)    Neck mass    Neuropathy    feet   Numbness and tingling in hands    Numbness in both legs    Palpitations    Schatzki's ring      Trouble swallowing    Wears glasses    Past Surgical History:  Procedure Laterality Date   ABDOMINAL HYSTERECTOMY     ABLATION     COLONOSCOPY     CYST REMOVAL NECK  05/13/12   EAR CYST EXCISION  05/13/2012   Procedure: CYST REMOVAL;  Surgeon: Ralene Ok, MD;  Location: [facility];  Service: General;  Laterality: Left;  excision of neck cyst   THYROID SURGERY  10/2011 - approximate   ablation   TONSILLECTOMY     UPPER GI ENDOSCOPY     Current Outpatient Medications on File Prior to Visit  Medication Sig Dispense Refill   amLODipine (NORVASC) 10 MG tablet Take 1 tablet by mouth once daily 90 tablet 3   atorvastatin (LIPITOR) 80 MG tablet Take 80 mg by mouth daily.     Cholecalciferol (VITAMIN D3) 50 MCG (2000 UT) capsule      dexamethasone (DECADRON) 4 MG tablet Take 2 tablets (8 mg) by mouth daily with breakfast for 3 days after chemotherapy each cycle.  Do not take prednisone on the days you take dexamethasone.     diphenhydrAMINE (BENADRYL) 25 MG tablet Take 25 mg by mouth at bedtime as needed. For sleep     gabapentin (NEURONTIN) 300 MG capsule Take 1 capsule (300 mg total) by mouth 2 (two) times daily. 60 capsule 11   hydrochlorothiazide (MICROZIDE) 12.5 MG capsule Take 1 capsule by mouth once daily 90 capsule 0   leflunomide (ARAVA) 10 MG tablet      levothyroxine (SYNTHROID) 150 MCG tablet Take 1 tablet by mouth once daily 90 tablet 2   OLANZapine (ZYPREXA) 5 MG tablet Take 1 tablet by mouth nightly for 4 nights, starting on the day of chemotherapy each cycle.     ondansetron (ZOFRAN) 8 MG tablet Take by mouth.     potassium chloride SA (K-DUR,KLOR-CON) 20 MEQ tablet Take 1 tablet (20 mEq total) by mouth daily. 90 tablet 3   predniSONE (DELTASONE) 20 MG tablet Take 1 tablet (20 mg total) by mouth daily with breakfast. (Patient taking differently: Take 15 mg by mouth daily with breakfast. Tapering down dose over next 26 weeks) 30 tablet 0   vitamin B-12  (CYANOCOBALAMIN) 1000 MCG tablet Take 1,000 mcg by mouth daily.     warfarin (COUMADIN) 5 MG tablet Take 1.5 tablets (7.5 mg total) by mouth daily for 30 days. (Patient taking differently: Take by mouth daily. 75mg  M/F 10mg  TWThSS) 135 tablet 3   No current facility-administered medications on file prior to visit.    Allergies  Allergen Reactions   Paclitaxel Other (  See Comments)    Flushing, Itching, Throat tightness   Social History   Socioeconomic History   Marital status: Single    Spouse name: Not on file   Number of children: Not on file   Years of education: Not on file   Highest education level: Not on file  Occupational History   Not on file  Social Needs   Financial resource strain: Not on file   Food insecurity    Worry: Not on file    Inability: Not on file   Transportation needs    Medical: Not on file    Non-medical: Not on file  Tobacco Use   Smoking status: Former Smoker    Years: 32.00    Quit date: 06/19/2011    Years since quitting: 7.4   Smokeless tobacco: Never Used  Substance and Sexual Activity   Alcohol use: No   Drug use: No   Sexual activity: Yes  Lifestyle   Physical activity    Days per week: Not on file    Minutes per session: Not on file   Stress: Not on file  Relationships   Social connections    Talks on phone: Not on file    Gets together: Not on file    Attends religious service: Not on file    Active member of club or organization: Not on file    Attends meetings of clubs or organizations: Not on file    Relationship status: Not on file   Intimate partner violence    Fear of current or ex partner: Not on file    Emotionally abused: Not on file    Physically abused: Not on file    Forced sexual activity: Not on file  Other Topics Concern   Not on file  Social History Narrative   Not on file     Review of Systems  All other systems reviewed and are negative.      Objective:   Physical  Exam Vitals signs reviewed.  Constitutional:      General: She is not in acute distress.    Appearance: Normal appearance. She is not ill-appearing or toxic-appearing.  Cardiovascular:     Rate and Rhythm: Normal rate and regular rhythm.     Pulses: Normal pulses.     Heart sounds: Normal heart sounds. No murmur. No friction rub. No gallop.   Pulmonary:     Effort: Pulmonary effort is normal. No respiratory distress.     Breath sounds: Normal breath sounds. No stridor. No wheezing, rhonchi or rales.  Skin:    Findings: No erythema.  Neurological:     Mental Status: She is alert.          Assessment & Plan:  1. 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  No Coumadin today.  Then reduce her dose to 7.5 mg of Coumadin on Monday, Wednesday, Friday and 10 mg on other days and recheck in 4 weeks.  Obtain arterial Dopplers to evaluate for any evidence of arterial thrombus or peripheral vascular disease.  If no significant arterial occlusion is seen, would treat the patient for neuropathic pain/neurogenic claudication.  If there is arterial thrombus, the patient would likely need to switch to Lovenox.  There is no evidence of limb threatening ischemia today on exam - PT with INR/Fingerstick - VAS [place] LOWER EXT ART SEG MULTI (SEGMENTALS & LE RAYNAUDS); Future

## 2018-12-13 ENCOUNTER — Encounter: Payer: Self-pay | Admitting: Family Medicine

## 2018-12-13 NOTE — Telephone Encounter (Signed)
Spoke to pt and her oncologist wants her to increase her potassium to 2 tab qd = 40 meg as her potassium was 2.9 when they checked it and she wanted to make sure it was ok with you and will she see any side effects from taking that much>

## 2018-12-15 ENCOUNTER — Ambulatory Visit (HOSPITAL_COMMUNITY)
Admission: RE | Admit: 2018-12-15 | Discharge: 2018-12-15 | Disposition: A | Discharge status: Home / Self Care | Payer: Managed Care, Other (non HMO) | Source: Ambulatory Visit | Attending: Internal Medicine | Admitting: Internal Medicine

## 2018-12-15 ENCOUNTER — Other Ambulatory Visit: Payer: Self-pay

## 2018-12-15 DIAGNOSIS — I743 Embolism and thrombosis of arteries of the lower extremities: Secondary | ICD-10-CM | POA: Diagnosis not present

## 2019-01-10 ENCOUNTER — Ambulatory Visit: Payer: Managed Care, Other (non HMO) | Admitting: Family Medicine

## 2019-01-10 ENCOUNTER — Other Ambulatory Visit: Payer: Self-pay

## 2019-01-10 ENCOUNTER — Encounter: Payer: Self-pay | Admitting: Family Medicine

## 2019-01-10 DIAGNOSIS — I743 Embolism and thrombosis of arteries of the lower extremities: Secondary | ICD-10-CM | POA: Diagnosis not present

## 2019-01-10 LAB — PT WITH INR/FINGERSTICK
INR, fingerstick: 4.1 ratio — ABNORMAL HIGH
PT, fingerstick: 49.5 s — ABNORMAL HIGH (ref 10.5–13.1)

## 2019-01-10 NOTE — Progress Notes (Signed)
Subjective:    Patient ID: Donna [name], female    DOB: 18-Jun-1960, 58 y.o.   MRN: [id number]  HPI 09/09/18 Patient was originally scheduled to be seen today in the office for hospital follow-up.  However on intake, she explains that she has been exposed to a Dietitian who has had COVID-19.  She was notified of this by [facility] after she had left the hospital.  Therefore we asked the patient to go back out to the parking lot so that I could see her outside.  The laboratory technician wore appropriate personal protective equipment.  The point to today's visit was to recheck her INR.  She was discharged from the hospital on Sunday on Coumadin 5 mg tablets daily.  Today is day 4 and her INR as expected has not risen.  It is only 1.3 however she is only been on Coumadin for 4 days.  She is also taking Lovenox 120 mg subcu twice daily.  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  I have no hospital records to review.  She is also told that her cancer has returned.  This is the most likely cause of her DVT.  Due to the patient's quarantine for COVID exposure, I was unable to perform a physical exam.  We also did not take vital signs as we attempted to minimize contact.  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  I have no records to review.  INR is subtherapeutic.  I will increase her Coumadin to 7.5 mg daily.  She will take this today, Friday, Saturday, Sunday and we will see the patient back on Monday at 9 AM and recheck her INR.  We will discontinue Lovenox once INR is therapeutic.  09/13/18 Here today for follow up.  Has been on Coumadin 7.5 mg daily Friday, Saturday, Sunday.  I was able to locate her hospital records from April 14.  I have copied these records and included them below for my reference:  Surgeries Performed: RLE angiogram with SFA, popliteal artery,and tibial artery thrombectomies as well as VBX stent placement in  aorta                                                                         Brief History of Present Illness: Donna [name] a 31 y.o.femalewith ovarian cancer (on chemo, last dose of taxol 3/24), RA (on 20mg  Prednisone daily) HTN, HLD, and hypothyroid who presents with right foot pain and coolness.   Patient states she has been having intermittent right foot painfor about 7 to 10 days.However, the pain has progressively worsened over the last 1 to 2 days and is now constant and severe. She states that on the morning of presentation (4/14) she noted her right foot to be cooler than the left foot, which prompted her to seek medical attention at the cancer clinic. At the cancer clinic she reports having sensation of pins-and-needles. She has tried Tylenol and Advil with limited relief heat helps a little bit. She denies any skin rash or redness however notes cooler right lower extremity from about the shin mid shin down. She denies fevers, chills, headaches, changes  in vision, loss of consciousness, lightheadedness, dizziness, chest pain, or shortness of breath.  In the ED they were unable to palpate a pulse or obtain a doppler signals in the right lower extremity prompting vascular consultation. Patient remains hemodynamically appropriate and afebrile.  Hospital Course: As above, Donna [name] presented on 08/31/2018. She was started on a heparin drip and she was optimized for surgery. As part of the pre-op workup, a COVID screening was performed, and she was found to be negative. On 09/02/2018, she was taken to the operating room with Dr. Tobie Poet and Dr. Jimmye Norman for a RLE angiogram with placement of aortic stent and thrombectomies of the SFA, popliteal and tibial arteries. The procedure was without complication. Estimated blood loss was 127mL. Hemostasis was achieved prior to conclusion of the case. She remained stable throughout  the operation and was transferred to the PACU and subsequently to a surgical stepdown unit for continued care. Post-operatively, they were examined routinely by floor nursing and by the vascular surgery team. Heparin drip was resumed in the immediate post-op period, however, following surgery, the hematology team was consulted and recommendation for anticoagulation therapy to include Warfarin with a Lovenox bridge was made. She was transitioned from heparin drip to Warfarin and Lovenox on 09/06/2018.   During their hospitalization, she worked with physical therapy. They were cleared on 09/05/2018, with recommendation for rolling walker. Case management worked to facilitate delivery of the rolling walker prior to discharge.  Per patient request, a referral to establish care with [facility] rheumatology was made. Donna [name] stated that she wished to transfer care from [facility] health to [facility] for management of her previously diagnosed rheumatoid arthritis.   On 09/05/2018, out of an abundance of precaution, she was placed on contact and droplet isolation due to positive diagnoses among some of the unit nursing staff. Donna [name] was tested for Covid-19 on 09/06/2018 and no virus was detected.    INR today is 2.0 and therapeutic.  Given the sudden jump, I believe we need to reduce her Coumadin dose slightly to avoid a supratherapeutic INR in 1 week however I do believe she can discontinue Lovenox shots tomorrow.  Otherwise she is doing well with no concerns.  At that time, my plan was: Decrease Coumadin to 7.5 mg on Monday, Wednesday, Friday, and Saturday.  Take 5 mg of Coumadin on Tuesday, Thursday, Sunday.  Recheck INR on Monday.  Discontinue Lovenox shots tomorrow.  Patient has a hypercoagulable state most likely due to her cancer and require long-term Coumadin indefinitely given the fact she had an arterial thrombosis.  09/20/18 Here to recheck INR.  Patient's INR today is 2.5!.  I am very pleased with this.  She is  currently taking 7.5 mg on Monday Wednesday and Friday and Saturday.  She is taking 5 mg of Coumadin on Tuesday, Thursday, and Sunday.  She is now off the Lovenox.  However the surgical site where she had her thrombectomy has become tender and painful erythematous and indurated.  With a chaperone present, I examined the surgical site.  There is a vertical incision with staples in the right groin.  There is warm indurated painful erythematous skin surrounding the entire incision site.  The erythema extends out 6 cm horizontally from the incision site and 12 cm vertically.  It is firm and indurated but I do not appreciate any fluctuance.  It is tender to touch.  At that time, my plan was: Patient appears to be developing a secondary cellulitis.  Begin  Keflex 500 mg 4 times daily for 7 days.  Recheck wound on Friday.  If still indurated firm and painful, we may remove the first 2 staples to see if there is any pus in the wound.  If improving on antibiotics however we will not perform this.  INR is therapeutic.  Continue Coumadin and his current dose and reassess INR in 4 weeks.  09/24/18  Since taking the antibiotic, the redness has improved on her leg.  The induration is much better.  The pain has improved.  There is white and serous drainage coming from the middle portion of the incision however there is no fluctuance to palpation and there is no tenderness to palpation.  Therefore I do not believe that the staples need to be removed today.  She has an additional 3 days of antibiotics to carry her through the weekend.  However her INR has dropped precipitously from 2.5 down to 1.4.  At that time, my plan was: Cellulitis seems to be improved.  I will recheck this Monday after she is completed antibiotics.  If it still draining from the wound I would recommend removing the middle 2 staples and probing the wound for any collection of pus that may be persistent however it appears to have improved on antibiotics.  I  am concerned by the drop in her INR.  I am not certain if that is due to antibiotics or if that could be due to the chemotherapy.  Therefore I recommended that she take an additional 5 mg of Coumadin today and then increase her normal routine to 7.5 mg every day.  We will recheck her INR next week  09/28/18 Here for recheck.  The redness has improved around the wound on her right leg.  There is now a pinkish color directly adjacent to the operative site but noes evidence of spreading cellulitis.  It is slightly warmer to the touch than the surrounding area but it is not painful.  It is not fiery red.  The patient denies any tenderness at all.  There is serous drainage coming from the center of the wound where the skin has dehisced slightly however there is no pus that I can express from that area.  Patient has reported some mild dysuria since starting the antibiotics and is also having some pelvic itching.  I suspect a yeast infection.  Her INR today has improved to 1.6.  Obviously this is too soon to check an INR after making a change just 4 days ago.  Therefore I do believe the 7.5 will be the correct/appropriate dose.  However I also want to try to get her to therapeutic as quick as possible.  She would also like to check her cholesterol while she is here.  At that time, my plan was: I will screen the patient's cholesterol today and also check a CBC and a CMP per her request.  INR is subtherapeutic therefore I recommended that she bolused 2 mg of Coumadin today, 10 mg of Coumadin tomorrow and then maintain at 7.5 mg of Coumadin daily thereafter.  Recheck INR in 10 days to make gross adjustment if necessary.  Cellulitis seems to be improving.  She has 1 day left of Keflex.  I see no residual infection today.  I gave the patient warning signs of recurrent infection and when to come back but otherwise finished antibiotic and no further follow-up is necessary for that unless warning signs develop.  I suspect her  dysuria is likely  due to a yeast infection however I will check a urinalysis.  If negative I would recommend trying Diflucan for a yeast infection  10/08/18 Patient is here today to recheck her INR.  INR is still subtherapeutic at 1.6.  Patient is taking 7.5 mg of Coumadin every day.  At her last visit, her hemoglobin had dropped from 10.6-8.6.  I believed this to be due to her chemotherapy and bone marrow suppression.  However I was concerned due to the fact she is taking Coumadin and therefore I recommended a stool test to rule out bleeding.  Patient has yet to bring back a stool sample.  She denies any melena or hematochezia.  She denies any abdominal pain.  She is scheduled for chemo next week.  She is now 3 weeks out from chemo and therefore I would expect that this should have improved.  At that time, my plan was: Increase Coumadin to 10 mg on Monday, Wednesday, Friday, and Saturday.  Take 7.5 mg on Tuesday, Thursday, Sunday.  Recheck INR in 1 week.  Recheck CBC to monitor for further decline in her hemoglobin.  Although I believe the drop in her hemoglobin was likely bone marrow suppression secondary to her chemotherapy I do want to rule out an occult GI bleed given the fact that she is on Coumadin.  I will also check a fecal occult stool card as well.  If stool card is negative and hemoglobin is stable I feel no further work-up is necessary.  Patient notified her oncologist and per her report they felt that this was also likely the situation  11/01/18 INR was 3 on 6/1.  INR today is subtherapeutic at 1.6.  She is currently on 10 mg of Coumadin on Wednesday, Wednesday, Friday, and Saturday.  She takes 7.5 mg of Coumadin on Tuesday, Thursday, Sunday.  She is also asking for refill on her gabapentin.  She uses twice a day for nerve pain and neuropathy in her legs.  At that time, my plan was: INR is subtherapeutic.  Increase Coumadin to 10 mg on all days except Monday and Friday.  On Monday and Friday take  7.5 mg.  Recheck INR in 2 weeks.  Recent hemoglobin checked at [facility] was up to 10.6.  Therefore the drop in her hemoglobin was due to bone marrow suppression from her chemotherapy.   11/11/18 INR today is therapeutic at 2.5.  Patient is currently taking 10 mg of Coumadin all days except Monday and Friday.  On Monday and Friday she is taking 7.5 mg.  Overall she is doing well with no concerns.  They are slowly weaning her down off her prednisone.  She is due for her next chemotherapy treatment in 4 weeks.  12/09/18 Here to recheck INR.  INR today is supratherapeutic at 3.9.  She has been taking 10 mg a day of Coumadin except Mondays and Fridays only she takes 7.5.  She denies any changes in her medication.  She does complain now of claudication in her right leg.  She describes cramping pain in her feet whenever she walks more than 100 feet.  This improves if she rest.  However she also reports burning pain in her right leg and numbness and tingling in her right leg.  She has a palpable femoral pulse today.  She has a palpable popliteal pulse today.  She has a palpable dorsalis pedis and posterior tibialis pulse today.  The extremity is warm and well-perfused.  Question is if this is  neurogenic claudication versus arterial claudication.  AT that time, my plan was: Supratherapeutic.  No Coumadin today.  Then reduce her dose to 7.5 mg of Coumadin on Monday, Wednesday, Friday and 10 mg on other days and recheck in 4 weeks.  Obtain arterial Dopplers to evaluate for any evidence of arterial thrombus or peripheral vascular disease.  If no significant arterial occlusion is seen, would treat the patient for neuropathic pain/neurogenic claudication.  If there is arterial thrombus, the patient would likely need to switch to Lovenox.  There is no evidence of limb threatening ischemia today on exam  01/10/19 INR today is 4.1.  Currently on coumadin 10 mg on M,W,F and Sun and 7.5 mg on all other days.  Denies any bleeding  or bruising. Past Medical History:  Diagnosis Date   Anxiety    Arterial thrombosis (HCC)    right sfa, popliteal artery.  Require thromectomy at [facility] and lifelong anticoagulation.   Arthritis    Cancer ([place])    ovarian, recurrent on chemo (carboplatin, doxorubicin) at [facility]   Chest pain    Colon polyp    Constipation    Depression    Full dentures    GERD (gastroesophageal reflux disease)    Hemorrhoid    HLD (hyperlipidemia) 12/03/2012   Hyperlipidemia    Hypertension    Hypothyroidism    IBS (irritable bowel syndrome)    Neck mass    Neuropathy    feet   Numbness and tingling in hands    Numbness in both legs    Palpitations    Schatzki's ring    Trouble swallowing    Wears glasses    Past Surgical History:  Procedure Laterality Date   ABDOMINAL HYSTERECTOMY     ABLATION     COLONOSCOPY     CYST REMOVAL NECK  05/13/12   EAR CYST EXCISION  05/13/2012   Procedure: CYST REMOVAL;  Surgeon: Ralene Ok, MD;  Location: [place];  Service: General;  Laterality: Left;  excision of neck cyst   THYROID SURGERY  10/2011 - approximate   ablation   TONSILLECTOMY     UPPER GI ENDOSCOPY     Current Outpatient Medications on File Prior to Visit  Medication Sig Dispense Refill   amLODipine (NORVASC) 10 MG tablet Take 1 tablet by mouth once daily 90 tablet 3   atorvastatin (LIPITOR) 80 MG tablet Take 80 mg by mouth daily.     Cholecalciferol (VITAMIN D3) 50 MCG (2000 UT) capsule      dexamethasone (DECADRON) 4 MG tablet Take 2 tablets (8 mg) by mouth daily with breakfast for 3 days after chemotherapy each cycle.  Do not take prednisone on the days you take dexamethasone.     diphenhydrAMINE (BENADRYL) 25 MG tablet Take 25 mg by mouth at bedtime as needed. For sleep     gabapentin (NEURONTIN) 300 MG capsule Take 1 capsule (300 mg total) by mouth 2 (two) times daily. 60 capsule 11   hydrochlorothiazide (MICROZIDE) 12.5 MG capsule Take 1 capsule  by mouth once daily 90 capsule 0   leflunomide (ARAVA) 10 MG tablet      levothyroxine (SYNTHROID) 150 MCG tablet Take 1 tablet by mouth once daily 90 tablet 2   OLANZapine (ZYPREXA) 5 MG tablet Take 1 tablet by mouth nightly for 4 nights, starting on the day of chemotherapy each cycle.     ondansetron (ZOFRAN) 8 MG tablet Take by mouth.     potassium chloride SA (K-DUR,KLOR-CON) 20  MEQ tablet Take 1 tablet (20 mEq total) by mouth daily. 90 tablet 3   vitamin B-12 (CYANOCOBALAMIN) 1000 MCG tablet Take 1,000 mcg by mouth daily.     warfarin (COUMADIN) 5 MG tablet Take 1.5 tablets (7.5 mg total) by mouth daily for 30 days. (Patient taking differently: Take by mouth daily. 75mg  M/F 10mg  TWThSS) 135 tablet 3   No current facility-administered medications on file prior to visit.    Allergies  Allergen Reactions   Paclitaxel Other (See Comments)    Flushing, Itching, Throat tightness   Social History   Socioeconomic History   Marital status: Single    Spouse name: Not on file   Number of children: Not on file   Years of education: Not on file   Highest education level: Not on file  Occupational History   Not on file  Social Needs   Financial resource strain: Not on file   Food insecurity    Worry: Not on file    Inability: Not on file   Transportation needs    Medical: Not on file    Non-medical: Not on file  Tobacco Use   Smoking status: Former Smoker    Years: 32.00    Quit date: 06/19/2011    Years since quitting: 7.5   Smokeless tobacco: Never Used  Substance and Sexual Activity   Alcohol use: No   Drug use: No   Sexual activity: Yes  Lifestyle   Physical activity    Days per week: Not on file    Minutes per session: Not on file   Stress: Not on file  Relationships   Social connections    Talks on phone: Not on file    Gets together: Not on file    Attends religious service: Not on file    Active member of club or organization: Not on file      Attends meetings of clubs or organizations: Not on file    Relationship status: Not on file   Intimate partner violence    Fear of current or ex partner: Not on file    Emotionally abused: Not on file    Physically abused: Not on file    Forced sexual activity: Not on file  Other Topics Concern   Not on file  Social History Narrative   Not on file     Review of Systems  All other systems reviewed and are negative.      Objective:   Physical Exam Vitals signs reviewed.  Constitutional:      General: She is not in acute distress.    Appearance: Normal appearance. She is not ill-appearing or toxic-appearing.  Cardiovascular:     Rate and Rhythm: Normal rate and regular rhythm.     Pulses: Normal pulses.     Heart sounds: Normal heart sounds. No murmur. No friction rub. No gallop.   Pulmonary:     Effort: Pulmonary effort is normal. No respiratory distress.     Breath sounds: Normal breath sounds. No stridor. No wheezing, rhonchi or rales.  Skin:    Findings: No erythema.  Neurological:     Mental Status: She is alert.          Assessment & Plan:  1. 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  Hold Coumadin for 48 hours.  Then resume Coumadin 7.5 mg daily and recheck INR in 2 weeks.

## 2019-01-16 ENCOUNTER — Other Ambulatory Visit: Payer: Self-pay | Admitting: Family Medicine

## 2019-01-25 ENCOUNTER — Other Ambulatory Visit: Payer: Self-pay

## 2019-01-25 ENCOUNTER — Encounter: Payer: Self-pay | Admitting: Family Medicine

## 2019-01-25 ENCOUNTER — Ambulatory Visit (INDEPENDENT_AMBULATORY_CARE_PROVIDER_SITE_OTHER): Payer: Managed Care, Other (non HMO) | Admitting: Family Medicine

## 2019-01-25 VITALS — BP 112/60 | HR 79 | Temp 98.3°F | Resp 16 | Wt 235.0 lb

## 2019-01-25 DIAGNOSIS — Z23 Encounter for immunization: Secondary | ICD-10-CM | POA: Diagnosis not present

## 2019-01-25 DIAGNOSIS — I743 Embolism and thrombosis of arteries of the lower extremities: Secondary | ICD-10-CM

## 2019-01-25 LAB — PT WITH INR/FINGERSTICK
INR, fingerstick: 1.4 ratio — ABNORMAL HIGH
PT, fingerstick: 16.3 s — ABNORMAL HIGH (ref 10.5–13.1)

## 2019-01-25 NOTE — Progress Notes (Signed)
Subjective:    Patient ID: Donna [name], female    DOB: Sep 24, 1960, 58 y.o.   MRN: [id number]  HPI 09/09/18 Patient was originally scheduled to be seen today in the office for hospital follow-up.  However on intake, she explains that she has been exposed to a Dietitian who has had COVID-19.  She was notified of this by [facility] after she had left the hospital.  Therefore we asked the patient to go back out to the parking lot so that I could see her outside.  The laboratory technician wore appropriate personal protective equipment.  The point to today's visit was to recheck her INR.  She was discharged from the hospital on Sunday on Coumadin 5 mg tablets daily.  Today is day 4 and her INR as expected has not risen.  It is only 1.3 however she is only been on Coumadin for 4 days.  She is also taking Lovenox 120 mg subcu twice daily.  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  I have no hospital records to review.  She is also told that her cancer has returned.  This is the most likely cause of her DVT.  Due to the patient's quarantine for COVID exposure, I was unable to perform a physical exam.  We also did not take vital signs as we attempted to minimize contact.  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  I have no records to review.  INR is subtherapeutic.  I will increase her Coumadin to 7.5 mg daily.  She will take this today, Friday, Saturday, Sunday and we will see the patient back on Monday at 9 AM and recheck her INR.  We will discontinue Lovenox once INR is therapeutic.  09/13/18 Here today for follow up.  Has been on Coumadin 7.5 mg daily Friday, Saturday, Sunday.  I was able to locate her hospital records from April 14.  I have copied these records and included them below for my reference:  Surgeries Performed: RLE angiogram with SFA, popliteal artery,and tibial artery thrombectomies as well as VBX stent placement in  aorta  Brief History of Present Illness: Donna [name] a 78 y.o.femalewith ovarian cancer (on chemo, last dose of taxol 3/24), RA (on 20mg  Prednisone daily) HTN, HLD, and hypothyroid who presents with right foot pain and coolness.   Patient states she has been having intermittent right foot painfor about 7 to 10 days.However, the pain has progressively worsened over the last 1 to 2 days and is now constant and severe. She states that on the morning of presentation (4/14) she noted her right foot to be cooler than the left foot, which prompted her to seek medical attention at the cancer clinic. At the cancer clinic she reports having sensation of pins-and-needles. She has tried Tylenol and Advil with limited relief heat helps a little bit. She denies any skin rash or redness however notes cooler right lower extremity from about the shin mid shin down. She denies fevers, chills, headaches, changes in vision, loss of consciousness, lightheadedness, dizziness, chest pain, or shortness of breath.  In the ED they were unable to palpate a pulse or obtain a doppler signals in the right lower extremity prompting vascular consultation. Patient remains hemodynamically appropriate and afebrile.  Hospital Course: As above, Donna [name] presented on 08/31/2018. She was started on a heparin drip and she was optimized for surgery. As part of the pre-op  workup, a COVID screening was performed, and she was found to be negative. On 09/02/2018, she was taken to the operating room with Dr. Tobie Poet and Dr. Jimmye Norman for a RLE angiogram with placement of aortic stent and thrombectomies of the SFA, popliteal and tibial arteries. The procedure was without complication. Estimated blood loss was 199mL. Hemostasis was achieved prior to conclusion of the case. She remained stable throughout the operation and was transferred to the PACU and subsequently to a surgical stepdown unit for continued care. Post-operatively, they were  examined routinely by floor nursing and by the vascular surgery team. Heparin drip was resumed in the immediate post-op period, however, following surgery, the hematology team was consulted and recommendation for anticoagulation therapy to include Warfarin with a Lovenox bridge was made. She was transitioned from heparin drip to Warfarin and Lovenox on 09/06/2018.   During their hospitalization, she worked with physical therapy. They were cleared on 09/05/2018, with recommendation for rolling walker. Case management worked to facilitate delivery of the rolling walker prior to discharge.  Per patient request, a referral to establish care with [facility] rheumatology was made. Donna [name] stated that she wished to transfer care from [facility] health to [facility] for management of her previously diagnosed rheumatoid arthritis.   On 09/05/2018, out of an abundance of precaution, she was placed on contact and droplet isolation due to positive diagnoses among some of the unit nursing staff. Donna [name] was tested for Covid-19 on 09/06/2018 and no virus was detected.    INR today is 2.0 and therapeutic.  Given the sudden jump, I believe we need to reduce her Coumadin dose slightly to avoid a supratherapeutic INR in 1 week however I do believe she can discontinue Lovenox shots tomorrow.  Otherwise she is doing well with no concerns.  At that time, my plan was: Decrease Coumadin to 7.5 mg on Monday, Wednesday, Friday, and Saturday.  Take 5 mg of Coumadin on Tuesday, Thursday, Sunday.  Recheck INR on Monday.  Discontinue Lovenox shots tomorrow.  Patient has a hypercoagulable state most likely due to her cancer and require long-term Coumadin indefinitely given the fact she had an arterial thrombosis.  09/20/18 Here to recheck INR.  Patient's INR today is 2.5!.  I am very pleased with this.  She is currently taking 7.5 mg on Monday Wednesday and Friday and Saturday.  She is taking 5 mg of Coumadin on Tuesday, Thursday, and Sunday.  She is  now off the Lovenox.  However the surgical site where she had her thrombectomy has become tender and painful erythematous and indurated.  With a chaperone present, I examined the surgical site.  There is a vertical incision with staples in the right groin.  There is warm indurated painful erythematous skin surrounding the entire incision site.  The erythema extends out 6 cm horizontally from the incision site and 12 cm vertically.  It is firm and indurated but I do not appreciate any fluctuance.  It is tender to touch.  At that time, my plan was: Patient appears to be developing a secondary cellulitis.  Begin Keflex 500 mg 4 times daily for 7 days.  Recheck wound on Friday.  If still indurated firm and painful, we may remove the first 2 staples to see if there is any pus in the wound.  If improving on antibiotics however we will not perform this.  INR is therapeutic.  Continue Coumadin and his current dose and reassess INR in 4 weeks.  09/24/18  Since taking  the antibiotic, the redness has improved on her leg.  The induration is much better.  The pain has improved.  There is white and serous drainage coming from the middle portion of the incision however there is no fluctuance to palpation and there is no tenderness to palpation.  Therefore I do not believe that the staples need to be removed today.  She has an additional 3 days of antibiotics to carry her through the weekend.  However her INR has dropped precipitously from 2.5 down to 1.4.  At that time, my plan was: Cellulitis seems to be improved.  I will recheck this Monday after she is completed antibiotics.  If it still draining from the wound I would recommend removing the middle 2 staples and probing the wound for any collection of pus that may be persistent however it appears to have improved on antibiotics.  I am concerned by the drop in her INR.  I am not certain if that is due to antibiotics or if that could be due to the chemotherapy.  Therefore I  recommended that she take an additional 5 mg of Coumadin today and then increase her normal routine to 7.5 mg every day.  We will recheck her INR next week  09/28/18 Here for recheck.  The redness has improved around the wound on her right leg.  There is now a pinkish color directly adjacent to the operative site but noes evidence of spreading cellulitis.  It is slightly warmer to the touch than the surrounding area but it is not painful.  It is not fiery red.  The patient denies any tenderness at all.  There is serous drainage coming from the center of the wound where the skin has dehisced slightly however there is no pus that I can express from that area.  Patient has reported some mild dysuria since starting the antibiotics and is also having some pelvic itching.  I suspect a yeast infection.  Her INR today has improved to 1.6.  Obviously this is too soon to check an INR after making a change just 4 days ago.  Therefore I do believe the 7.5 will be the correct/appropriate dose.  However I also want to try to get her to therapeutic as quick as possible.  She would also like to check her cholesterol while she is here.  At that time, my plan was: I will screen the patient's cholesterol today and also check a CBC and a CMP per her request.  INR is subtherapeutic therefore I recommended that she bolused 2 mg of Coumadin today, 10 mg of Coumadin tomorrow and then maintain at 7.5 mg of Coumadin daily thereafter.  Recheck INR in 10 days to make gross adjustment if necessary.  Cellulitis seems to be improving.  She has 1 day left of Keflex.  I see no residual infection today.  I gave the patient warning signs of recurrent infection and when to come back but otherwise finished antibiotic and no further follow-up is necessary for that unless warning signs develop.  I suspect her dysuria is likely due to a yeast infection however I will check a urinalysis.  If negative I would recommend trying Diflucan for a yeast  infection  10/08/18 Patient is here today to recheck her INR.  INR is still subtherapeutic at 1.6.  Patient is taking 7.5 mg of Coumadin every day.  At her last visit, her hemoglobin had dropped from 10.6-8.6.  I believed this to be due to her chemotherapy  and bone marrow suppression.  However I was concerned due to the fact she is taking Coumadin and therefore I recommended a stool test to rule out bleeding.  Patient has yet to bring back a stool sample.  She denies any melena or hematochezia.  She denies any abdominal pain.  She is scheduled for chemo next week.  She is now 3 weeks out from chemo and therefore I would expect that this should have improved.  At that time, my plan was: Increase Coumadin to 10 mg on Monday, Wednesday, Friday, and Saturday.  Take 7.5 mg on Tuesday, Thursday, Sunday.  Recheck INR in 1 week.  Recheck CBC to monitor for further decline in her hemoglobin.  Although I believe the drop in her hemoglobin was likely bone marrow suppression secondary to her chemotherapy I do want to rule out an occult GI bleed given the fact that she is on Coumadin.  I will also check a fecal occult stool card as well.  If stool card is negative and hemoglobin is stable I feel no further work-up is necessary.  Patient notified her oncologist and per her report they felt that this was also likely the situation  11/01/18 INR was 3 on 6/1.  INR today is subtherapeutic at 1.6.  She is currently on 10 mg of Coumadin on Wednesday, Wednesday, Friday, and Saturday.  She takes 7.5 mg of Coumadin on Tuesday, Thursday, Sunday.  She is also asking for refill on her gabapentin.  She uses twice a day for nerve pain and neuropathy in her legs.  At that time, my plan was: INR is subtherapeutic.  Increase Coumadin to 10 mg on all days except Monday and Friday.  On Monday and Friday take 7.5 mg.  Recheck INR in 2 weeks.  Recent hemoglobin checked at [facility] was up to 10.6.  Therefore the drop in her hemoglobin was due to  bone marrow suppression from her chemotherapy.   11/11/18 INR today is therapeutic at 2.5.  Patient is currently taking 10 mg of Coumadin all days except Monday and Friday.  On Monday and Friday she is taking 7.5 mg.  Overall she is doing well with no concerns.  They are slowly weaning her down off her prednisone.  She is due for her next chemotherapy treatment in 4 weeks.  12/09/18 Here to recheck INR.  INR today is supratherapeutic at 3.9.  She has been taking 10 mg a day of Coumadin except Mondays and Fridays only she takes 7.5.  She denies any changes in her medication.  She does complain now of claudication in her right leg.  She describes cramping pain in her feet whenever she walks more than 100 feet.  This improves if she rest.  However she also reports burning pain in her right leg and numbness and tingling in her right leg.  She has a palpable femoral pulse today.  She has a palpable popliteal pulse today.  She has a palpable dorsalis pedis and posterior tibialis pulse today.  The extremity is warm and well-perfused.  Question is if this is neurogenic claudication versus arterial claudication.  AT that time, my plan was: Supratherapeutic.  No Coumadin today.  Then reduce her dose to 7.5 mg of Coumadin on Monday, Wednesday, Friday and 10 mg on other days and recheck in 4 weeks.  Obtain arterial Dopplers to evaluate for any evidence of arterial thrombus or peripheral vascular disease.  If no significant arterial occlusion is seen, would treat the patient for  neuropathic pain/neurogenic claudication.  If there is arterial thrombus, the patient would likely need to switch to Lovenox.  There is no evidence of limb threatening ischemia today on exam  01/10/19 INR today is 4.1.  Currently on coumadin 10 mg on M,W,F and Sun and 7.5 mg on all other days.  Denies any bleeding or bruising.  At that time, my plan was: Supertherapeutic INR.  Hold Coumadin for 48 hours.  Then resume Coumadin 7.5 mg daily and  recheck INR in 2 weeks.  01/25/19 INR is 1.4.  Only on 7.5 mg of coumadin 7.5 mg poqd.  She denies any bleeding or bruising.  She denies any antibiotic use or change in her diet.  She has been recommended to have an endoscopy by her specialist at [facility].  She is questioning how she should do that on Coumadin.  Past Medical History:  Diagnosis Date   Anxiety    Arterial thrombosis (HCC)    right sfa, popliteal artery.  Require thromectomy at [facility] and lifelong anticoagulation.   Arthritis    Cancer ([place])    ovarian, recurrent on chemo (carboplatin, doxorubicin) at [facility]   Chest pain    Colon polyp    Constipation    Depression    Full dentures    GERD (gastroesophageal reflux disease)    Hemorrhoid    HLD (hyperlipidemia) 12/03/2012   Hyperlipidemia    Hypertension    Hypothyroidism    IBS (irritable bowel syndrome)    Neck mass    Neuropathy    feet   Numbness and tingling in hands    Numbness in both legs    Palpitations    Schatzki's ring    Trouble swallowing    Wears glasses    Past Surgical History:  Procedure Laterality Date   ABDOMINAL HYSTERECTOMY     ABLATION     COLONOSCOPY     CYST REMOVAL NECK  05/13/12   EAR CYST EXCISION  05/13/2012   Procedure: CYST REMOVAL;  Surgeon: Ralene Ok, MD;  Location: [facility];  Service: General;  Laterality: Left;  excision of neck cyst   THYROID SURGERY  10/2011 - approximate   ablation   TONSILLECTOMY     UPPER GI ENDOSCOPY     Current Outpatient Medications on File Prior to Visit  Medication Sig Dispense Refill   amLODipine (NORVASC) 10 MG tablet Take 1 tablet by mouth once daily 90 tablet 3   atorvastatin (LIPITOR) 80 MG tablet Take 80 mg by mouth daily.     Cholecalciferol (VITAMIN D3) 50 MCG (2000 UT) capsule      diphenhydrAMINE (BENADRYL) 25 MG tablet Take 25 mg by mouth at bedtime as needed. For sleep     gabapentin (NEURONTIN) 300 MG capsule Take 1 capsule (300 mg total) by mouth  2 (two) times daily. 60 capsule 11   hydrochlorothiazide (MICROZIDE) 12.5 MG capsule Take 1 capsule by mouth once daily 90 capsule 0   KLOR-CON M20 20 MEQ tablet Take 1 tablet by mouth once daily 90 tablet 1   levothyroxine (SYNTHROID) 150 MCG tablet Take 1 tablet by mouth once daily 90 tablet 2   OLANZapine (ZYPREXA) 5 MG tablet Take 1 tablet by mouth nightly for 4 nights, starting on the day of chemotherapy each cycle.     ondansetron (ZOFRAN) 8 MG tablet Take by mouth.     vitamin B-12 (CYANOCOBALAMIN) 1000 MCG tablet Take 1,000 mcg by mouth daily.     warfarin (COUMADIN) 5  MG tablet Take 1.5 tablets (7.5 mg total) by mouth daily for 30 days. (Patient taking differently: Take by mouth daily. 75mg  M/F 10mg  TWThSS) 135 tablet 3   dexamethasone (DECADRON) 4 MG tablet Take 2 tablets (8 mg) by mouth daily with breakfast for 3 days after chemotherapy each cycle.  Do not take prednisone on the days you take dexamethasone.     leflunomide (ARAVA) 20 MG tablet Take 20 mg by mouth daily.     predniSONE (DELTASONE) 1 MG tablet Take 6 mg by mouth daily.     No current facility-administered medications on file prior to visit.    Allergies  Allergen Reactions   Paclitaxel Other (See Comments)    Flushing, Itching, Throat tightness   Social History   Socioeconomic History   Marital status: Single    Spouse name: Not on file   Number of children: Not on file   Years of education: Not on file   Highest education level: Not on file  Occupational History   Not on file  Social Needs   Financial resource strain: Not on file   Food insecurity    Worry: Not on file    Inability: Not on file   Transportation needs    Medical: Not on file    Non-medical: Not on file  Tobacco Use   Smoking status: Former Smoker    Years: 32.00    Quit date: 06/19/2011    Years since quitting: 7.6   Smokeless tobacco: Never Used  Substance and Sexual Activity   Alcohol use: No   Drug use:  No   Sexual activity: Yes  Lifestyle   Physical activity    Days per week: Not on file    Minutes per session: Not on file   Stress: Not on file  Relationships   Social connections    Talks on phone: Not on file    Gets together: Not on file    Attends religious service: Not on file    Active member of club or organization: Not on file    Attends meetings of clubs or organizations: Not on file    Relationship status: Not on file   Intimate partner violence    Fear of current or ex partner: Not on file    Emotionally abused: Not on file    Physically abused: Not on file    Forced sexual activity: Not on file  Other Topics Concern   Not on file  Social History Narrative   Not on file     Review of Systems  All other systems reviewed and are negative.      Objective:   Physical Exam Vitals signs reviewed.  Constitutional:      General: She is not in acute distress.    Appearance: Normal appearance. She is not ill-appearing or toxic-appearing.  Cardiovascular:     Rate and Rhythm: Normal rate and regular rhythm.     Pulses: Normal pulses.     Heart sounds: Normal heart sounds. No murmur. No friction rub. No gallop.   Pulmonary:     Effort: Pulmonary effort is normal. No respiratory distress.     Breath sounds: Normal breath sounds. No stridor. No wheezing, rhonchi or rales.  Skin:    Findings: No erythema.  Neurological:     Mental Status: She is alert.          Assessment & Plan:  1. 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  Recheck INR in 2 weeks.  Recommended that the patient schedule the endoscopy.  1 week prior to the endoscopy we would discontinue Coumadin and replace with Lovenox.  She could hold Lovenox 24 hours prior to endoscopy and then resume Coumadin immediately thereafter.

## 2019-02-07 ENCOUNTER — Other Ambulatory Visit: Payer: Self-pay

## 2019-02-08 ENCOUNTER — Ambulatory Visit (INDEPENDENT_AMBULATORY_CARE_PROVIDER_SITE_OTHER): Payer: Managed Care, Other (non HMO) | Admitting: Family Medicine

## 2019-02-08 ENCOUNTER — Encounter: Payer: Self-pay | Admitting: Family Medicine

## 2019-02-08 VITALS — BP 120/76 | HR 80 | Temp 98.2°F | Resp 16 | Ht 67.5 in | Wt 247.0 lb

## 2019-02-08 DIAGNOSIS — L608 Other nail disorders: Secondary | ICD-10-CM

## 2019-02-08 DIAGNOSIS — I743 Embolism and thrombosis of arteries of the lower extremities: Secondary | ICD-10-CM

## 2019-02-08 LAB — PT WITH INR/FINGERSTICK
INR, fingerstick: 1.8 ratio — ABNORMAL HIGH
PT, fingerstick: 21.8 s — ABNORMAL HIGH (ref 10.5–13.1)

## 2019-02-08 NOTE — Progress Notes (Signed)
Subjective:    Patient ID: Donna [name], female    DOB: 05-Sep-1960, 58 y.o.   MRN: [id number]  HPI 09/09/18 Patient was originally scheduled to be seen today in the office for hospital follow-up.  However on intake, she explains that she has been exposed to a Dietitian who has had COVID-19.  She was notified of this by [facility] after she had left the hospital.  Therefore we asked the patient to go back out to the parking lot so that I could see her outside.  The laboratory technician wore appropriate personal protective equipment.  The point to today's visit was to recheck her INR.  She was discharged from the hospital on Sunday on Coumadin 5 mg tablets daily.  Today is day 4 and her INR as expected has not risen.  It is only 1.3 however she is only been on Coumadin for 4 days.  She is also taking Lovenox 120 mg subcu twice daily.  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  I have no hospital records to review.  She is also told that her cancer has returned.  This is the most likely cause of her DVT.  Due to the patient's quarantine for COVID exposure, I was unable to perform a physical exam.  We also did not take vital signs as we attempted to minimize contact.  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  I have no records to review.  INR is subtherapeutic.  I will increase her Coumadin to 7.5 mg daily.  She will take this today, Friday, Saturday, Sunday and we will see the patient back on Monday at 9 AM and recheck her INR.  We will discontinue Lovenox once INR is therapeutic.  09/13/18 Here today for follow up.  Has been on Coumadin 7.5 mg daily Friday, Saturday, Sunday.  I was able to locate her hospital records from April 14.  I have copied these records and included them below for my reference:  Surgeries Performed: RLE angiogram with SFA, popliteal artery,and tibial artery thrombectomies as well as VBX stent placement in  aorta  Brief History of Present Illness: Donna [name] a 19 y.o.femalewith ovarian cancer (on chemo, last dose of taxol 3/24), RA (on 20mg  Prednisone daily) HTN, HLD, and hypothyroid who presents with right foot pain and coolness.   Patient states she has been having intermittent right foot painfor about 7 to 10 days.However, the pain has progressively worsened over the last 1 to 2 days and is now constant and severe. She states that on the morning of presentation (4/14) she noted her right foot to be cooler than the left foot, which prompted her to seek medical attention at the cancer clinic. At the cancer clinic she reports having sensation of pins-and-needles. She has tried Tylenol and Advil with limited relief heat helps a little bit. She denies any skin rash or redness however notes cooler right lower extremity from about the shin mid shin down. She denies fevers, chills, headaches, changes in vision, loss of consciousness, lightheadedness, dizziness, chest pain, or shortness of breath.  In the ED they were unable to palpate a pulse or obtain a doppler signals in the right lower extremity prompting vascular consultation. Patient remains hemodynamically appropriate and afebrile.  Hospital Course: As above, Donna [name] presented on 08/31/2018. She was started on a heparin drip and she was optimized for surgery. As part of the pre-op  workup, a COVID screening was performed, and she was found to be negative. On 09/02/2018, she was taken to the operating room with Dr. Tobie Poet and Dr. Jimmye Norman for a RLE angiogram with placement of aortic stent and thrombectomies of the SFA, popliteal and tibial arteries. The procedure was without complication. Estimated blood loss was 168mL. Hemostasis was achieved prior to conclusion of the case. She remained stable throughout the operation and was transferred to the PACU and subsequently to a surgical stepdown unit for continued care. Post-operatively, they were  examined routinely by floor nursing and by the vascular surgery team. Heparin drip was resumed in the immediate post-op period, however, following surgery, the hematology team was consulted and recommendation for anticoagulation therapy to include Warfarin with a Lovenox bridge was made. She was transitioned from heparin drip to Warfarin and Lovenox on 09/06/2018.   During their hospitalization, she worked with physical therapy. They were cleared on 09/05/2018, with recommendation for rolling walker. Case management worked to facilitate delivery of the rolling walker prior to discharge.  Per patient request, a referral to establish care with [facility] rheumatology was made. Donna [name] stated that she wished to transfer care from [facility] health to [facility] for management of her previously diagnosed rheumatoid arthritis.   On 09/05/2018, out of an abundance of precaution, she was placed on contact and droplet isolation due to positive diagnoses among some of the unit nursing staff. Donna [name] was tested for Covid-19 on 09/06/2018 and no virus was detected.    INR today is 2.0 and therapeutic.  Given the sudden jump, I believe we need to reduce her Coumadin dose slightly to avoid a supratherapeutic INR in 1 week however I do believe she can discontinue Lovenox shots tomorrow.  Otherwise she is doing well with no concerns.  At that time, my plan was: Decrease Coumadin to 7.5 mg on Monday, Wednesday, Friday, and Saturday.  Take 5 mg of Coumadin on Tuesday, Thursday, Sunday.  Recheck INR on Monday.  Discontinue Lovenox shots tomorrow.  Patient has a hypercoagulable state most likely due to her cancer and require long-term Coumadin indefinitely given the fact she had an arterial thrombosis.  09/20/18 Here to recheck INR.  Patient's INR today is 2.5!.  I am very pleased with this.  She is currently taking 7.5 mg on Monday Wednesday and Friday and Saturday.  She is taking 5 mg of Coumadin on Tuesday, Thursday, and Sunday.  She is  now off the Lovenox.  However the surgical site where she had her thrombectomy has become tender and painful erythematous and indurated.  With a chaperone present, I examined the surgical site.  There is a vertical incision with staples in the right groin.  There is warm indurated painful erythematous skin surrounding the entire incision site.  The erythema extends out 6 cm horizontally from the incision site and 12 cm vertically.  It is firm and indurated but I do not appreciate any fluctuance.  It is tender to touch.  At that time, my plan was: Patient appears to be developing a secondary cellulitis.  Begin Keflex 500 mg 4 times daily for 7 days.  Recheck wound on Friday.  If still indurated firm and painful, we may remove the first 2 staples to see if there is any pus in the wound.  If improving on antibiotics however we will not perform this.  INR is therapeutic.  Continue Coumadin and his current dose and reassess INR in 4 weeks.  09/24/18  Since taking  the antibiotic, the redness has improved on her leg.  The induration is much better.  The pain has improved.  There is white and serous drainage coming from the middle portion of the incision however there is no fluctuance to palpation and there is no tenderness to palpation.  Therefore I do not believe that the staples need to be removed today.  She has an additional 3 days of antibiotics to carry her through the weekend.  However her INR has dropped precipitously from 2.5 down to 1.4.  At that time, my plan was: Cellulitis seems to be improved.  I will recheck this Monday after she is completed antibiotics.  If it still draining from the wound I would recommend removing the middle 2 staples and probing the wound for any collection of pus that may be persistent however it appears to have improved on antibiotics.  I am concerned by the drop in her INR.  I am not certain if that is due to antibiotics or if that could be due to the chemotherapy.  Therefore I  recommended that she take an additional 5 mg of Coumadin today and then increase her normal routine to 7.5 mg every day.  We will recheck her INR next week  09/28/18 Here for recheck.  The redness has improved around the wound on her right leg.  There is now a pinkish color directly adjacent to the operative site but noes evidence of spreading cellulitis.  It is slightly warmer to the touch than the surrounding area but it is not painful.  It is not fiery red.  The patient denies any tenderness at all.  There is serous drainage coming from the center of the wound where the skin has dehisced slightly however there is no pus that I can express from that area.  Patient has reported some mild dysuria since starting the antibiotics and is also having some pelvic itching.  I suspect a yeast infection.  Her INR today has improved to 1.6.  Obviously this is too soon to check an INR after making a change just 4 days ago.  Therefore I do believe the 7.5 will be the correct/appropriate dose.  However I also want to try to get her to therapeutic as quick as possible.  She would also like to check her cholesterol while she is here.  At that time, my plan was: I will screen the patient's cholesterol today and also check a CBC and a CMP per her request.  INR is subtherapeutic therefore I recommended that she bolused 2 mg of Coumadin today, 10 mg of Coumadin tomorrow and then maintain at 7.5 mg of Coumadin daily thereafter.  Recheck INR in 10 days to make gross adjustment if necessary.  Cellulitis seems to be improving.  She has 1 day left of Keflex.  I see no residual infection today.  I gave the patient warning signs of recurrent infection and when to come back but otherwise finished antibiotic and no further follow-up is necessary for that unless warning signs develop.  I suspect her dysuria is likely due to a yeast infection however I will check a urinalysis.  If negative I would recommend trying Diflucan for a yeast  infection  10/08/18 Patient is here today to recheck her INR.  INR is still subtherapeutic at 1.6.  Patient is taking 7.5 mg of Coumadin every day.  At her last visit, her hemoglobin had dropped from 10.6-8.6.  I believed this to be due to her chemotherapy  and bone marrow suppression.  However I was concerned due to the fact she is taking Coumadin and therefore I recommended a stool test to rule out bleeding.  Patient has yet to bring back a stool sample.  She denies any melena or hematochezia.  She denies any abdominal pain.  She is scheduled for chemo next week.  She is now 3 weeks out from chemo and therefore I would expect that this should have improved.  At that time, my plan was: Increase Coumadin to 10 mg on Monday, Wednesday, Friday, and Saturday.  Take 7.5 mg on Tuesday, Thursday, Sunday.  Recheck INR in 1 week.  Recheck CBC to monitor for further decline in her hemoglobin.  Although I believe the drop in her hemoglobin was likely bone marrow suppression secondary to her chemotherapy I do want to rule out an occult GI bleed given the fact that she is on Coumadin.  I will also check a fecal occult stool card as well.  If stool card is negative and hemoglobin is stable I feel no further work-up is necessary.  Patient notified her oncologist and per her report they felt that this was also likely the situation  11/01/18 INR was 3 on 6/1.  INR today is subtherapeutic at 1.6.  She is currently on 10 mg of Coumadin on Wednesday, Wednesday, Friday, and Saturday.  She takes 7.5 mg of Coumadin on Tuesday, Thursday, Sunday.  She is also asking for refill on her gabapentin.  She uses twice a day for nerve pain and neuropathy in her legs.  At that time, my plan was: INR is subtherapeutic.  Increase Coumadin to 10 mg on all days except Monday and Friday.  On Monday and Friday take 7.5 mg.  Recheck INR in 2 weeks.  Recent hemoglobin checked at [facility] was up to 10.6.  Therefore the drop in her hemoglobin was due to  bone marrow suppression from her chemotherapy.   11/11/18 INR today is therapeutic at 2.5.  Patient is currently taking 10 mg of Coumadin all days except Monday and Friday.  On Monday and Friday she is taking 7.5 mg.  Overall she is doing well with no concerns.  They are slowly weaning her down off her prednisone.  She is due for her next chemotherapy treatment in 4 weeks.  12/09/18 Here to recheck INR.  INR today is supratherapeutic at 3.9.  She has been taking 10 mg a day of Coumadin except Mondays and Fridays only she takes 7.5.  She denies any changes in her medication.  She does complain now of claudication in her right leg.  She describes cramping pain in her feet whenever she walks more than 100 feet.  This improves if she rest.  However she also reports burning pain in her right leg and numbness and tingling in her right leg.  She has a palpable femoral pulse today.  She has a palpable popliteal pulse today.  She has a palpable dorsalis pedis and posterior tibialis pulse today.  The extremity is warm and well-perfused.  Question is if this is neurogenic claudication versus arterial claudication.  AT that time, my plan was: Supratherapeutic.  No Coumadin today.  Then reduce her dose to 7.5 mg of Coumadin on Monday, Wednesday, Friday and 10 mg on other days and recheck in 4 weeks.  Obtain arterial Dopplers to evaluate for any evidence of arterial thrombus or peripheral vascular disease.  If no significant arterial occlusion is seen, would treat the patient for  neuropathic pain/neurogenic claudication.  If there is arterial thrombus, the patient would likely need to switch to Lovenox.  There is no evidence of limb threatening ischemia today on exam  01/10/19 INR today is 4.1.  Currently on coumadin 10 mg on M,W,F and Sun and 7.5 mg on all other days.  Denies any bleeding or bruising.  At that time, my plan was: Supertherapeutic INR.  Hold Coumadin for 48 hours.  Then resume Coumadin 7.5 mg daily and  recheck INR in 2 weeks.  01/25/19 INR is 1.4.  Only on 7.5 mg of coumadin 7.5 mg poqd.  She denies any bleeding or bruising.  She denies any antibiotic use or change in her diet.  She has been recommended to have an endoscopy by her specialist at [facility].  She is questioning how she should do that on Coumadin.  At that time, my plan was: Recommended increasing Coumadin to 10 mg on Wednesday and Sunday and 7.5 mg on all other days.  Recheck INR in 2 weeks.  Recommended that the patient schedule the endoscopy.  1 week prior to the endoscopy we would discontinue Coumadin and replace with Lovenox.  She could hold Lovenox 24 hours prior to endoscopy and then resume Coumadin immediately thereafter.  02/08/19 Patient is here today for follow-up.  INR is still subtherapeutic at 1.8 though much better than last time.  She has not missed any doses of Coumadin.  She does report pain in her left great toe.  On examination, the left great toe is thickened dysmorphic.  It is opaque and brown.  It is ingrown bilaterally and is causing her significant pain when she touches it and when she walks.  There is no evidence of infection.  She would like to see a podiatrist to have this removed.  Past Medical History:  Diagnosis Date   Anxiety    Arterial thrombosis (HCC)    right sfa, popliteal artery.  Require thromectomy at [facility] and lifelong anticoagulation.   Arthritis    Cancer ([place])    ovarian, recurrent on chemo (carboplatin, doxorubicin) at [facility]   Chest pain    Colon polyp    Constipation    Depression    Full dentures    GERD (gastroesophageal reflux disease)    Hemorrhoid    HLD (hyperlipidemia) 12/03/2012   Hyperlipidemia    Hypertension    Hypothyroidism    IBS (irritable bowel syndrome)    Neck mass    Neuropathy    feet   Numbness and tingling in hands    Numbness in both legs    Palpitations    Schatzki's ring    Trouble swallowing    Wears glasses    Past Surgical  History:  Procedure Laterality Date   ABDOMINAL HYSTERECTOMY     ABLATION     COLONOSCOPY     CYST REMOVAL NECK  05/13/12   EAR CYST EXCISION  05/13/2012   Procedure: CYST REMOVAL;  Surgeon: Ralene Ok, MD;  Location: [place];  Service: General;  Laterality: Left;  excision of neck cyst   THYROID SURGERY  10/2011 - approximate   ablation   TONSILLECTOMY     UPPER GI ENDOSCOPY     Current Outpatient Medications on File Prior to Visit  Medication Sig Dispense Refill   amLODipine (NORVASC) 10 MG tablet Take 1 tablet by mouth once daily 90 tablet 3   atorvastatin (LIPITOR) 80 MG tablet Take 80 mg by mouth daily.  Cholecalciferol (VITAMIN D3) 50 MCG (2000 UT) capsule      dexamethasone (DECADRON) 4 MG tablet Take 2 tablets (8 mg) by mouth daily with breakfast for 3 days after chemotherapy each cycle.  Do not take prednisone on the days you take dexamethasone.     diphenhydrAMINE (BENADRYL) 25 MG tablet Take 25 mg by mouth at bedtime as needed. For sleep     gabapentin (NEURONTIN) 300 MG capsule Take 1 capsule (300 mg total) by mouth 2 (two) times daily. 60 capsule 11   hydrochlorothiazide (MICROZIDE) 12.5 MG capsule Take 1 capsule by mouth once daily 90 capsule 0   KLOR-CON M20 20 MEQ tablet Take 1 tablet by mouth once daily 90 tablet 1   leflunomide (ARAVA) 20 MG tablet Take 20 mg by mouth daily.     levothyroxine (SYNTHROID) 150 MCG tablet Take 1 tablet by mouth once daily 90 tablet 2   OLANZapine (ZYPREXA) 5 MG tablet Take 1 tablet by mouth nightly for 4 nights, starting on the day of chemotherapy each cycle.     ondansetron (ZOFRAN) 8 MG tablet Take by mouth.     predniSONE (DELTASONE) 1 MG tablet Take 6 mg by mouth daily.     vitamin B-12 (CYANOCOBALAMIN) 1000 MCG tablet Take 1,000 mcg by mouth daily.     warfarin (COUMADIN) 5 MG tablet Take 1.5 tablets (7.5 mg total) by mouth daily for 30 days. (Patient taking differently: Take by mouth daily. 7.5mg  all other  days 10mg  W/Sun) 135 tablet 3   No current facility-administered medications on file prior to visit.    Allergies  Allergen Reactions   Paclitaxel Other (See Comments)    Flushing, Itching, Throat tightness   Social History   Socioeconomic History   Marital status: Single    Spouse name: Not on file   Number of children: Not on file   Years of education: Not on file   Highest education level: Not on file  Occupational History   Not on file  Social Needs   Financial resource strain: Not on file   Food insecurity    Worry: Not on file    Inability: Not on file   Transportation needs    Medical: Not on file    Non-medical: Not on file  Tobacco Use   Smoking status: Former Smoker    Years: 32.00    Quit date: 06/19/2011    Years since quitting: 7.6   Smokeless tobacco: Never Used  Substance and Sexual Activity   Alcohol use: No   Drug use: No   Sexual activity: Yes  Lifestyle   Physical activity    Days per week: Not on file    Minutes per session: Not on file   Stress: Not on file  Relationships   Social connections    Talks on phone: Not on file    Gets together: Not on file    Attends religious service: Not on file    Active member of club or organization: Not on file    Attends meetings of clubs or organizations: Not on file    Relationship status: Not on file   Intimate partner violence    Fear of current or ex partner: Not on file    Emotionally abused: Not on file    Physically abused: Not on file    Forced sexual activity: Not on file  Other Topics Concern   Not on file  Social History Narrative   Not on file  Review of Systems  All other systems reviewed and are negative.      Objective:   Physical Exam Vitals signs reviewed.  Constitutional:      General: She is not in acute distress.    Appearance: Normal appearance. She is not ill-appearing or toxic-appearing.  Cardiovascular:     Rate and Rhythm: Normal rate and  regular rhythm.     Pulses: Normal pulses.     Heart sounds: Normal heart sounds. No murmur. No friction rub. No gallop.   Pulmonary:     Effort: Pulmonary effort is normal. No respiratory distress.     Breath sounds: Normal breath sounds. No stridor. No wheezing, rhonchi or rales.  Skin:    Findings: No erythema.  Neurological:     Mental Status: She is alert.   Dysmorphic left great toenail       Assessment & Plan:  For the dysmorphic left great toenail, I will consult podiatry to have the toenail removed particular given her compromised blood flow to the right leg and her history of neuropathy.  Regarding the arterial embolism in her right leg, I will increase her Coumadin to 10 mg every day except Monday Wednesday Friday.  On Monday Wednesday Friday she will take 7.5 mg a day.  Take an extra 5 mg today to help improve her INR to a therapeutic level.  Recheck INR in 2 weeks.

## 2019-02-13 ENCOUNTER — Other Ambulatory Visit: Payer: Self-pay | Admitting: Family Medicine

## 2019-02-18 ENCOUNTER — Encounter: Payer: Self-pay | Admitting: Podiatry

## 2019-02-18 ENCOUNTER — Other Ambulatory Visit: Payer: Self-pay

## 2019-02-18 ENCOUNTER — Ambulatory Visit: Payer: Managed Care, Other (non HMO) | Admitting: Podiatry

## 2019-02-18 VITALS — BP 141/90

## 2019-02-18 DIAGNOSIS — M79675 Pain in left toe(s): Secondary | ICD-10-CM | POA: Diagnosis not present

## 2019-02-18 DIAGNOSIS — B351 Tinea unguium: Secondary | ICD-10-CM

## 2019-02-18 DIAGNOSIS — M79674 Pain in right toe(s): Secondary | ICD-10-CM | POA: Diagnosis not present

## 2019-02-18 NOTE — Progress Notes (Signed)
  Subjective:  Patient ID: Donna [name], female    DOB: 09/12/1960,  MRN: [id number]  Chief Complaint  Patient presents with  . Nail Problem    pt is here for bil toenail fungus of both of the big toenails, pt states that it is often worse on the right big toenail   58 y.o. female returns for the above complaint.  Patient states that she wants to have the right big toenail removed completely.  However she has multiple systemic complications such as rheumatoid arthritis she had a has a history of malignant neoplasm currently chemotherapy.  She also is on warfarin.  Today she presents to the office wanting to know treatments for onychomycosis of all of her digits.  She states that they are painful in nature.  Objective:   Vitals:   02/18/19 1516  BP: (!) 141/90   General AA&O x3. Normal mood and affect.  Vascular Pedal pulses palpable.  Neurologic Epicritic sensation grossly intact.  Dermatologic No open lesions. Skin normal texture and turgor. Toenails x 10 elongated, thickened, dystrophic.  Orthopedic: Pain to palpation about the toenails.   Assessment & Plan:  Patient was evaluated and treated and all questions answered.  Onychomycosis with pain  -Nails palliatively debrided as below. -Educated on self-care -I extensively spoke to her about removing her right big toenail due to severe onychomycosis of the digit.  However I would like to be medical advice prior to doing so including her optimization of blood thinner.  I explained to her that there is no urgency to removing the toenail and we can discuss about it in the future.  Procedure: Nail Debridement Rationale: pain  Type of Debridement: manual, sharp debridement. Instrumentation: Nail nipper, rotary burr. Number of Nails: 10     Return if symptoms worsen or fail to improve.

## 2019-02-22 ENCOUNTER — Other Ambulatory Visit: Payer: Self-pay

## 2019-02-22 ENCOUNTER — Ambulatory Visit (INDEPENDENT_AMBULATORY_CARE_PROVIDER_SITE_OTHER): Payer: Managed Care, Other (non HMO) | Admitting: Family Medicine

## 2019-02-22 ENCOUNTER — Encounter: Payer: Self-pay | Admitting: Family Medicine

## 2019-02-22 VITALS — BP 124/82 | HR 68 | Temp 97.6°F | Resp 16 | Ht 67.5 in | Wt 247.0 lb

## 2019-02-22 DIAGNOSIS — I743 Embolism and thrombosis of arteries of the lower extremities: Secondary | ICD-10-CM

## 2019-02-22 DIAGNOSIS — F339 Major depressive disorder, recurrent, unspecified: Secondary | ICD-10-CM

## 2019-02-22 LAB — PT WITH INR/FINGERSTICK
INR, fingerstick: 2.5 ratio — ABNORMAL HIGH
PT, fingerstick: 30 s — ABNORMAL HIGH (ref 10.5–13.1)

## 2019-02-22 MED ORDER — ESCITALOPRAM OXALATE 10 MG PO TABS: 10.0000 mg | ORAL_TABLET | Freq: Every day | ORAL | 5 refills | Status: DC

## 2019-02-22 NOTE — Progress Notes (Signed)
Subjective:    Donna [name] ID: Donna Donna [name], female    DOB: [birth year], 59 y.o.   MRN: [id number]  HPI 09/09/18 Donna [name] was originally scheduled to be seen today in Donna office for hospital follow-up.  However on intake, she explains that she has been exposed to a Dietitian who has had COVID-19.  She was notified of this by [facility] after she had left Donna hospital.  Therefore we asked Donna Donna [name] to go back out to Donna parking lot so that I could see her outside.  Donna laboratory technician wore appropriate personal protective equipment.  Donna point to today's visit was to recheck her INR.  She was discharged from Donna hospital on Sunday on Coumadin 5 mg tablets daily.  Today is day 4 and her INR as expected has not risen.  It is only 1.3 however she is only been on Coumadin for 4 days.  She is also taking Lovenox 120 mg subcu twice daily.  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  I have no hospital records to review.  She is also told that her cancer has returned.  This is Donna most likely cause of her DVT.  Due to Donna Donna [name]'s quarantine for COVID exposure, I was unable to perform a physical exam.  We also did not take vital signs as we attempted to minimize contact.  At that time, my plan was: Donna [name] states that she has DVTs in both legs.  I have no records to review.  INR is subtherapeutic.  I will increase her Coumadin to 7.5 mg daily.  She will take this today, Friday, Saturday, Sunday and we will see Donna Donna [name] back on Monday at 9 AM and recheck her INR.  We will discontinue Lovenox once INR is therapeutic.  09/13/18 Here today for follow up.  Has been on Coumadin 7.5 mg daily Friday, Saturday, Sunday.  I was able to locate her hospital records from April 14.  I have copied these records and included them below for my reference:  Surgeries Performed: RLE angiogram with SFA, popliteal artery,and tibial artery thrombectomies as well as VBX stent placement in  aorta  Brief History of Present Illness: Donna Donna [name] a 8 y.o.femalewith ovarian cancer (on chemo, last dose of taxol 3/24), RA (on 20mg  Prednisone daily) HTN, HLD, and hypothyroid who presents with right foot pain and coolness.   Donna [name] states she has been having intermittent right foot painfor about 7 to 10 days.However, Donna pain has progressively worsened over Donna last 1 to 2 days and is now constant and severe. She states that on Donna morning of presentation (4/14) she noted her right foot to be cooler than Donna left foot, which prompted her to seek medical attention at Donna cancer clinic. At Donna cancer clinic she reports having sensation of pins-and-needles. She has tried Tylenol and Advil with limited relief heat helps a little bit. She denies any skin rash or redness however notes cooler right lower extremity from about Donna shin mid shin down. She denies fevers, chills, headaches, changes in vision, loss of consciousness, lightheadedness, dizziness, chest pain, or shortness of breath.  In Donna ED they were unable to palpate a pulse or obtain a doppler signals in Donna right lower extremity prompting vascular consultation. Donna [name] remains hemodynamically appropriate and afebrile.  Hospital Course: As above, Donna Donna [name] presented on 08/31/2018. She was started on a heparin drip and she was optimized for surgery. As part of Donna pre-op  workup, a COVID screening was performed, and she was found to be negative. On 09/02/2018, she was taken to Donna operating room with Dr. Tobie Poet and Dr. Jimmye Norman for a RLE angiogram with placement of aortic stent and thrombectomies of Donna SFA, popliteal and tibial arteries. Donna procedure was without complication. Estimated blood loss was 137mL. Hemostasis was achieved prior to conclusion of Donna case. She remained stable throughout Donna operation and was transferred to Donna PACU and subsequently to a surgical stepdown unit for continued care. Post-operatively, they were  examined routinely by floor nursing and by Donna vascular surgery team. Heparin drip was resumed in Donna immediate post-op period, however, following surgery, Donna hematology team was consulted and recommendation for anticoagulation therapy to include Warfarin with a Lovenox bridge was made. She was transitioned from heparin drip to Warfarin and Lovenox on 09/06/2018.   During their hospitalization, she worked with physical therapy. They were cleared on 09/05/2018, with recommendation for rolling walker. Case management worked to facilitate delivery of Donna rolling walker prior to discharge.  Per Donna [name] request, a referral to establish care with [facility] rheumatology was made. Donna Donna [name] that she wished to transfer care from [facility] health to [facility] for management of her previously diagnosed rheumatoid arthritis.   On 09/05/2018, out of an abundance of precaution, she was placed on contact and droplet isolation due to positive diagnoses among some of Donna unit nursing staff. Donna Donna [name] was tested for Covid-19 on 09/06/2018 and no virus was detected.    INR today is 2.0 and therapeutic.  Given Donna sudden jump, I believe we need to reduce her Coumadin dose slightly to avoid a supratherapeutic INR in 1 week however I do believe she can discontinue Lovenox shots tomorrow.  Otherwise she is doing well with no concerns.  At that time, my plan was: Decrease Coumadin to 7.5 mg on Monday, Wednesday, Friday, and Saturday.  Take 5 mg of Coumadin on Tuesday, Thursday, Sunday.  Recheck INR on Monday.  Discontinue Lovenox shots tomorrow.  Donna [name] has a hypercoagulable state most likely due to her cancer and require long-term Coumadin indefinitely given Donna fact she had an arterial thrombosis.  09/20/18 Here to recheck INR.  Donna [name]'s INR today is 2.5!.  I am very pleased with this.  She is currently taking 7.5 mg on Monday Wednesday and Friday and Saturday.  She is taking 5 mg of Coumadin on Tuesday, Thursday, and Sunday.  She is  now off Donna Lovenox.  However Donna surgical site where she had her thrombectomy has become tender and painful erythematous and indurated.  With a chaperone present, I examined Donna surgical site.  There is a vertical incision with staples in Donna right groin.  There is warm indurated painful erythematous skin surrounding Donna entire incision site.  Donna erythema extends out 6 cm horizontally from Donna incision site and 12 cm vertically.  It is firm and indurated but I do not appreciate any fluctuance.  It is tender to touch.  At that time, my plan was: Donna [name] appears to be developing a secondary cellulitis.  Begin Keflex 500 mg 4 times daily for 7 days.  Recheck wound on Friday.  If still indurated firm and painful, we may remove Donna first 2 staples to see if there is any pus in Donna wound.  If improving on antibiotics however we will not perform this.  INR is therapeutic.  Continue Coumadin and his current dose and reassess INR in 4 weeks.  09/24/18  Since taking  Donna antibiotic, Donna redness has improved on her leg.  Donna induration is much better.  Donna pain has improved.  There is white and serous drainage coming from Donna middle portion of Donna incision however there is no fluctuance to palpation and there is no tenderness to palpation.  Therefore I do not believe that Donna staples need to be removed today.  She has an additional 3 days of antibiotics to carry her through Donna weekend.  However her INR has dropped precipitously from 2.5 down to 1.4.  At that time, my plan was: Cellulitis seems to be improved.  I will recheck this Monday after she is completed antibiotics.  If it still draining from Donna wound I would recommend removing Donna middle 2 staples and probing Donna wound for any collection of pus that may be persistent however it appears to have improved on antibiotics.  I am concerned by Donna drop in her INR.  I am not certain if that is due to antibiotics or if that could be due to Donna chemotherapy.  Therefore I  recommended that she take an additional 5 mg of Coumadin today and then increase her normal routine to 7.5 mg every day.  We will recheck her INR next week  09/28/18 Here for recheck.  Donna redness has improved around Donna wound on her right leg.  There is now a pinkish color directly adjacent to Donna operative site but noes evidence of spreading cellulitis.  It is slightly warmer to Donna touch than Donna surrounding area but it is not painful.  It is not fiery red.  Donna Donna [name] denies any tenderness at all.  There is serous drainage coming from Donna center of Donna wound where Donna skin has dehisced slightly however there is no pus that I can express from that area.  Donna [name] has reported some mild dysuria since starting Donna antibiotics and is also having some pelvic itching.  I suspect a yeast infection.  Her INR today has improved to 1.6.  Obviously this is too soon to check an INR after making a change just 4 days ago.  Therefore I do believe Donna 7.5 will be Donna correct/appropriate dose.  However I also want to try to get her to therapeutic as quick as possible.  She would also like to check her cholesterol while she is here.  At that time, my plan was: I will screen Donna Donna [name]'s cholesterol today and also check a CBC and a CMP per her request.  INR is subtherapeutic therefore I recommended that she bolused 2 mg of Coumadin today, 10 mg of Coumadin tomorrow and then maintain at 7.5 mg of Coumadin daily thereafter.  Recheck INR in 10 days to make gross adjustment if necessary.  Cellulitis seems to be improving.  She has 1 day left of Keflex.  I see no residual infection today.  I gave Donna Donna [name] warning signs of recurrent infection and when to come back but otherwise finished antibiotic and no further follow-up is necessary for that unless warning signs develop.  I suspect her dysuria is likely due to a yeast infection however I will check a urinalysis.  If negative I would recommend trying Diflucan for a yeast  infection  10/08/18 Donna [name] is here today to recheck her INR.  INR is still subtherapeutic at 1.6.  Donna [name] is taking 7.5 mg of Coumadin every day.  At her last visit, her hemoglobin had dropped from 10.6-8.6.  I believed this to be due to her chemotherapy  and bone marrow suppression.  However I was concerned due to Donna fact she is taking Coumadin and therefore I recommended a stool test to rule out bleeding.  Donna [name] has yet to bring back a stool sample.  She denies any melena or hematochezia.  She denies any abdominal pain.  She is scheduled for chemo next week.  She is now 3 weeks out from chemo and therefore I would expect that this should have improved.  At that time, my plan was: Increase Coumadin to 10 mg on Monday, Wednesday, Friday, and Saturday.  Take 7.5 mg on Tuesday, Thursday, Sunday.  Recheck INR in 1 week.  Recheck CBC to monitor for further decline in her hemoglobin.  Although I believe Donna drop in her hemoglobin was likely bone marrow suppression secondary to her chemotherapy I do want to rule out an occult GI bleed given Donna fact that she is on Coumadin.  I will also check a fecal occult stool card as well.  If stool card is negative and hemoglobin is stable I feel no further work-up is necessary.  Donna [name] notified her oncologist and per her report they felt that this was also likely Donna situation  11/01/18 INR was 3 on 6/1.  INR today is subtherapeutic at 1.6.  She is currently on 10 mg of Coumadin on Wednesday, Wednesday, Friday, and Saturday.  She takes 7.5 mg of Coumadin on Tuesday, Thursday, Sunday.  She is also asking for refill on her gabapentin.  She uses twice a day for nerve pain and neuropathy in her legs.  At that time, my plan was: INR is subtherapeutic.  Increase Coumadin to 10 mg on all days except Monday and Friday.  On Monday and Friday take 7.5 mg.  Recheck INR in 2 weeks.  Recent hemoglobin checked at [facility] was up to 10.6.  Therefore Donna drop in her hemoglobin was due to  bone marrow suppression from her chemotherapy.   11/11/18 INR today is therapeutic at 2.5.  Donna [name] is currently taking 10 mg of Coumadin all days except Monday and Friday.  On Monday and Friday she is taking 7.5 mg.  Overall she is doing well with no concerns.  They are slowly weaning her down off her prednisone.  She is due for her next chemotherapy treatment in 4 weeks.  12/09/18 Here to recheck INR.  INR today is supratherapeutic at 3.9.  She has been taking 10 mg a day of Coumadin except Mondays and Fridays only she takes 7.5.  She denies any changes in her medication.  She does complain now of claudication in her right leg.  She describes cramping pain in her feet whenever she walks more than 100 feet.  This improves if she rest.  However she also reports burning pain in her right leg and numbness and tingling in her right leg.  She has a palpable femoral pulse today.  She has a palpable popliteal pulse today.  She has a palpable dorsalis pedis and posterior tibialis pulse today.  Donna extremity is warm and well-perfused.  Question is if this is neurogenic claudication versus arterial claudication.  AT that time, my plan was: Supratherapeutic.  No Coumadin today.  Then reduce her dose to 7.5 mg of Coumadin on Monday, Wednesday, Friday and 10 mg on other days and recheck in 4 weeks.  Obtain arterial Dopplers to evaluate for any evidence of arterial thrombus or peripheral vascular disease.  If no significant arterial occlusion is seen, would treat Donna Donna [name] for  neuropathic pain/neurogenic claudication.  If there is arterial thrombus, Donna Donna [name] would likely need to switch to Lovenox.  There is no evidence of limb threatening ischemia today on exam  01/10/19 INR today is 4.1.  Currently on coumadin 10 mg on M,W,F and Sun and 7.5 mg on all other days.  Denies any bleeding or bruising.  At that time, my plan was: Supertherapeutic INR.  Hold Coumadin for 48 hours.  Then resume Coumadin 7.5 mg daily and  recheck INR in 2 weeks.  01/25/19 INR is 1.4.  Only on 7.5 mg of coumadin 7.5 mg poqd.  She denies any bleeding or bruising.  She denies any antibiotic use or change in her diet.  She has been recommended to have an endoscopy by her specialist at [facility].  She is questioning how she should do that on Coumadin.  At that time, my plan was: Recommended increasing Coumadin to 10 mg on Wednesday and Sunday and 7.5 mg on all other days.  Recheck INR in 2 weeks.  Recommended that Donna Donna [name] schedule Donna endoscopy.  1 week prior to Donna endoscopy we would discontinue Coumadin and replace with Lovenox.  She could hold Lovenox 24 hours prior to endoscopy and then resume Coumadin immediately thereafter.  02/08/19 Donna [name] is here today for follow-up.  INR is still subtherapeutic at 1.8 though much better than last time.  She has not missed any doses of Coumadin.  She does report pain in her left great toe.  On examination, Donna left great toe is thickened dysmorphic.  It is opaque and brown.  It is ingrown bilaterally and is causing her significant pain when she touches it and when she walks.  There is no evidence of infection.  She would like to see a podiatrist to have this removed.  For Donna dysmorphic left great toenail, I will consult podiatry to have Donna toenail removed particular given her compromised blood flow to Donna right leg and her history of neuropathy.  Regarding Donna arterial embolism in her right leg, I will increase her Coumadin to 10 mg every day except Monday Wednesday Friday.  On Monday Wednesday Friday she will take 7.5 mg a day.  Take an extra 5 mg today to help improve her INR to a therapeutic level.  Recheck INR in 2 weeks.  02/22/19 Donna [name] is here today to recheck her INR.  INR is therapeutic at 2.5.  Donna [name] denies any bleeding or bruising.  However she does feel like she needs to start an antidepressant.  She has a history of depression in Donna past is taken Zoloft as well as Effexor which she did  not tolerate well due to side effects.  She reports feeling depressed.  She reports anhedonia.  She is come to realization that she will likely not be able to be cured from her cancer.  She is also contemplating disability.  All this has Donna Donna [name] overwhelmed.  She reports trouble sleeping, poor concentration, and lack of energy.  She would also like a referral to a psychologist for counseling.  Past Medical History:  Diagnosis Date   Anxiety    Arterial thrombosis (HCC)    right sfa, popliteal artery.  Require thromectomy at [facility] and lifelong anticoagulation.   Arthritis    Cancer ([place])    ovarian, recurrent on chemo (carboplatin, doxorubicin) at [facility]   Chest pain    Colon polyp    Constipation    Depression    Full dentures    GERD (  gastroesophageal reflux disease)    Hemorrhoid    HLD (hyperlipidemia) 12/03/2012   Hyperlipidemia    Hypertension    Hypothyroidism    IBS (irritable bowel syndrome)    Neck mass    Neuropathy    feet   Numbness and tingling in hands    Numbness in both legs    Palpitations    Schatzki's ring    Trouble swallowing    Wears glasses    Past Surgical History:  Procedure Laterality Date   ABDOMINAL HYSTERECTOMY     ABLATION     COLONOSCOPY     CYST REMOVAL NECK  05/13/12   EAR CYST EXCISION  05/13/2012   Procedure: CYST REMOVAL;  Surgeon: Ralene Ok, MD;  Location: [facility];  Service: General;  Laterality: Left;  excision of neck cyst   THYROID SURGERY  10/2011 - approximate   ablation   TONSILLECTOMY     UPPER GI ENDOSCOPY     Current Outpatient Medications on File Prior to Visit  Medication Sig Dispense Refill   amLODipine (NORVASC) 10 MG tablet Take 1 tablet by mouth once daily 90 tablet 3   atorvastatin (LIPITOR) 40 MG tablet Take 80 mg by mouth daily.     atorvastatin (LIPITOR) 80 MG tablet Take 80 mg by mouth daily.     Cholecalciferol (VITAMIN D3) 50 MCG (2000 UT) capsule      dexamethasone  (DECADRON) 4 MG tablet Take 2 tablets (8 mg) by mouth daily with breakfast for 3 days after chemotherapy each cycle.  Do not take prednisone on Donna days you take dexamethasone.     diphenhydrAMINE (BENADRYL) 25 MG tablet Take 25 mg by mouth at bedtime as needed. For sleep     gabapentin (NEURONTIN) 300 MG capsule Take 1 capsule (300 mg total) by mouth 2 (two) times daily. 60 capsule 11   hydrochlorothiazide (MICROZIDE) 12.5 MG capsule Take 1 capsule by mouth once daily 90 capsule 3   KLOR-CON M20 20 MEQ tablet Take 1 tablet by mouth once daily 90 tablet 1   leflunomide (ARAVA) 20 MG tablet Take 20 mg by mouth daily.     levothyroxine (SYNTHROID) 150 MCG tablet Take 1 tablet by mouth once daily 90 tablet 2   OLANZapine (ZYPREXA) 5 MG tablet Take 1 tablet by mouth nightly for 4 nights, starting on Donna day of chemotherapy each cycle.     olaparib (LYNPARZA) 150 MG tablet Take by mouth.     ondansetron (ZOFRAN) 8 MG tablet Take by mouth.     predniSONE (DELTASONE) 1 MG tablet Take 4 mg by mouth daily.      vitamin B-12 (CYANOCOBALAMIN) 1000 MCG tablet Take 1,000 mcg by mouth daily.     warfarin (COUMADIN) 5 MG tablet Take 1.5 tablets (7.5 mg total) by mouth daily for 30 days. (Donna [name] taking differently: Take by mouth daily. Coumadin to 10 mg every day except Monday Wednesday Friday.  On Monday Wednesday Friday she will take 7.5 mg a day.) 135 tablet 3   No current facility-administered medications on file prior to visit.    Allergies  Allergen Reactions   Paclitaxel Other (See Comments)    Flushing, Itching, Throat tightness   Social History   Socioeconomic History   Marital status: Single    Spouse name: Not on file   Number of children: Not on file   Years of education: Not on file   Highest education level: Not on file  Occupational History  Not on file  Social Needs   Financial resource strain: Not on file   Food insecurity    Worry: Not on file    Inability:  Not on file   Transportation needs    Medical: Not on file    Non-medical: Not on file  Tobacco Use   Smoking status: Former Smoker    Years: 32.00    Quit date: 06/19/2011    Years since quitting: 7.6   Smokeless tobacco: Never Used  Substance and Sexual Activity   Alcohol use: No   Drug use: No   Sexual activity: Yes  Lifestyle   Physical activity    Days per week: Not on file    Minutes per session: Not on file   Stress: Not on file  Relationships   Social connections    Talks on phone: Not on file    Gets together: Not on file    Attends religious service: Not on file    Active member of club or organization: Not on file    Attends meetings of clubs or organizations: Not on file    Relationship status: Not on file   Intimate partner violence    Fear of current or ex partner: Not on file    Emotionally abused: Not on file    Physically abused: Not on file    Forced sexual activity: Not on file  Other Topics Concern   Not on file  Social History Narrative   Not on file     Review of Systems  All other systems reviewed and are negative.      Objective:   Physical Exam Vitals signs reviewed.  Constitutional:      General: She is not in acute distress.    Appearance: Normal appearance. She is not ill-appearing or toxic-appearing.  Cardiovascular:     Rate and Rhythm: Normal rate and regular rhythm.     Pulses: Normal pulses.     Heart sounds: Normal heart sounds. No murmur. No friction rub. No gallop.   Pulmonary:     Effort: Pulmonary effort is normal. No respiratory distress.     Breath sounds: Normal breath sounds. No stridor. No wheezing, rhonchi or rales.  Skin:    Findings: No erythema.  Neurological:     Mental Status: She is alert.   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  Continue Coumadin 10 mg every day except Monday Wednesday and Friday and on those days use 7.5 mg.  Recheck INR in 6 weeks.  Begin Lexapro 10 mg a day for depression and reassess at her follow-up in 6 weeks.  I provided Donna Donna [name] contact information for [facility] to help arrange outpatient counseling/therapy sessions for her depression

## 2019-04-05 ENCOUNTER — Encounter: Payer: Self-pay | Admitting: Family Medicine

## 2019-04-05 ENCOUNTER — Ambulatory Visit: Payer: Managed Care, Other (non HMO) | Admitting: Family Medicine

## 2019-04-05 ENCOUNTER — Other Ambulatory Visit: Payer: Self-pay

## 2019-04-05 VITALS — BP 126/70 | HR 72 | Temp 97.5°F | Resp 16 | Ht 67.5 in | Wt 244.0 lb

## 2019-04-05 DIAGNOSIS — E039 Hypothyroidism, unspecified: Secondary | ICD-10-CM

## 2019-04-05 DIAGNOSIS — I743 Embolism and thrombosis of arteries of the lower extremities: Secondary | ICD-10-CM

## 2019-04-05 LAB — PT WITH INR/FINGERSTICK
INR, fingerstick: 3.1 ratio — ABNORMAL HIGH
PT, fingerstick: 37.1 s — ABNORMAL HIGH (ref 10.5–13.1)

## 2019-04-05 MED ORDER — ATORVASTATIN CALCIUM 40 MG PO TABS: 80.0000 mg | ORAL_TABLET | Freq: Every day | ORAL | 2 refills | Status: DC

## 2019-04-05 NOTE — Progress Notes (Signed)
Subjective:    Patient ID: Donna [name], female    DOB: May 29, 1960, 58 y.o.   MRN: [id number]  HPI 09/09/18 Patient was originally scheduled to be seen today in the office for hospital follow-up.  However on intake, she explains that she has been exposed to a Dietitian who has had COVID-19.  She was notified of this by [facility] after she had left the hospital.  Therefore we asked the patient to go back out to the parking lot so that I could see her outside.  The laboratory technician wore appropriate personal protective equipment.  The point to today's visit was to recheck her INR.  She was discharged from the hospital on Sunday on Coumadin 5 mg tablets daily.  Today is day 4 and her INR as expected has not risen.  It is only 1.3 however she is only been on Coumadin for 4 days.  She is also taking Lovenox 120 mg subcu twice daily.  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  I have no hospital records to review.  She is also told that her cancer has returned.  This is the most likely cause of her DVT.  Due to the patient's quarantine for COVID exposure, I was unable to perform a physical exam.  We also did not take vital signs as we attempted to minimize contact.  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  I have no records to review.  INR is subtherapeutic.  I will increase her Coumadin to 7.5 mg daily.  She will take this today, Friday, Saturday, Sunday and we will see the patient back on Monday at 9 AM and recheck her INR.  We will discontinue Lovenox once INR is therapeutic.  09/13/18 Here today for follow up.  Has been on Coumadin 7.5 mg daily Friday, Saturday, Sunday.  I was able to locate her hospital records from April 14.  I have copied these records and included them below for my reference:  Surgeries Performed: RLE angiogram with SFA, popliteal artery,and tibial artery thrombectomies as well as VBX stent placement in  aorta  Brief History of Present Illness: Donna [name] a 85 y.o.femalewith ovarian cancer (on chemo, last dose of taxol 3/24), RA (on 20mg  Prednisone daily) HTN, HLD, and hypothyroid who presents with right foot pain and coolness.   Patient states she has been having intermittent right foot painfor about 7 to 10 days.However, the pain has progressively worsened over the last 1 to 2 days and is now constant and severe. She states that on the morning of presentation (4/14) she noted her right foot to be cooler than the left foot, which prompted her to seek medical attention at the cancer clinic. At the cancer clinic she reports having sensation of pins-and-needles. She has tried Tylenol and Advil with limited relief heat helps a little bit. She denies any skin rash or redness however notes cooler right lower extremity from about the shin mid shin down. She denies fevers, chills, headaches, changes in vision, loss of consciousness, lightheadedness, dizziness, chest pain, or shortness of breath.  In the ED they were unable to palpate a pulse or obtain a doppler signals in the right lower extremity prompting vascular consultation. Patient remains hemodynamically appropriate and afebrile.  Hospital Course: As above, Donna [name] presented on 08/31/2018. She was started on a heparin drip and she was optimized for surgery. As part of the pre-op  workup, a COVID screening was performed, and she was found to be negative. On 09/02/2018, she was taken to the operating room with Dr. Tobie Poet and Dr. Jimmye Norman for a RLE angiogram with placement of aortic stent and thrombectomies of the SFA, popliteal and tibial arteries. The procedure was without complication. Estimated blood loss was 131mL. Hemostasis was achieved prior to conclusion of the case. She remained stable throughout the operation and was transferred to the PACU and subsequently to a surgical stepdown unit for continued care. Post-operatively, they were  examined routinely by floor nursing and by the vascular surgery team. Heparin drip was resumed in the immediate post-op period, however, following surgery, the hematology team was consulted and recommendation for anticoagulation therapy to include Warfarin with a Lovenox bridge was made. She was transitioned from heparin drip to Warfarin and Lovenox on 09/06/2018.   During their hospitalization, she worked with physical therapy. They were cleared on 09/05/2018, with recommendation for rolling walker. Case management worked to facilitate delivery of the rolling walker prior to discharge.  Per patient request, a referral to establish care with [facility] rheumatology was made. Donna [name] stated that she wished to transfer care from [facility] to [facility] for management of her previously diagnosed rheumatoid arthritis.   On 09/05/2018, out of an abundance of precaution, she was placed on contact and droplet isolation due to positive diagnoses among some of the unit nursing staff. Donna [name] was tested for Covid-19 on 09/06/2018 and no virus was detected.    INR today is 2.0 and therapeutic.  Given the sudden jump, I believe we need to reduce her Coumadin dose slightly to avoid a supratherapeutic INR in 1 week however I do believe she can discontinue Lovenox shots tomorrow.  Otherwise she is doing well with no concerns.  At that time, my plan was: Decrease Coumadin to 7.5 mg on Monday, Wednesday, Friday, and Saturday.  Take 5 mg of Coumadin on Tuesday, Thursday, Sunday.  Recheck INR on Monday.  Discontinue Lovenox shots tomorrow.  Patient has a hypercoagulable state most likely due to her cancer and require long-term Coumadin indefinitely given the fact she had an arterial thrombosis.  09/20/18 Here to recheck INR.  Patient's INR today is 2.5!.  I am very pleased with this.  She is currently taking 7.5 mg on Monday Wednesday and Friday and Saturday.  She is taking 5 mg of Coumadin on Tuesday, Thursday, and Sunday.  She is  now off the Lovenox.  However the surgical site where she had her thrombectomy has become tender and painful erythematous and indurated.  With a chaperone present, I examined the surgical site.  There is a vertical incision with staples in the right groin.  There is warm indurated painful erythematous skin surrounding the entire incision site.  The erythema extends out 6 cm horizontally from the incision site and 12 cm vertically.  It is firm and indurated but I do not appreciate any fluctuance.  It is tender to touch.  At that time, my plan was: Patient appears to be developing a secondary cellulitis.  Begin Keflex 500 mg 4 times daily for 7 days.  Recheck wound on Friday.  If still indurated firm and painful, we may remove the first 2 staples to see if there is any pus in the wound.  If improving on antibiotics however we will not perform this.  INR is therapeutic.  Continue Coumadin and his current dose and reassess INR in 4 weeks.  09/24/18  Since taking  the antibiotic, the redness has improved on her leg.  The induration is much better.  The pain has improved.  There is white and serous drainage coming from the middle portion of the incision however there is no fluctuance to palpation and there is no tenderness to palpation.  Therefore I do not believe that the staples need to be removed today.  She has an additional 3 days of antibiotics to carry her through the weekend.  However her INR has dropped precipitously from 2.5 down to 1.4.  At that time, my plan was: Cellulitis seems to be improved.  I will recheck this Monday after she is completed antibiotics.  If it still draining from the wound I would recommend removing the middle 2 staples and probing the wound for any collection of pus that may be persistent however it appears to have improved on antibiotics.  I am concerned by the drop in her INR.  I am not certain if that is due to antibiotics or if that could be due to the chemotherapy.  Therefore I  recommended that she take an additional 5 mg of Coumadin today and then increase her normal routine to 7.5 mg every day.  We will recheck her INR next week  09/28/18 Here for recheck.  The redness has improved around the wound on her right leg.  There is now a pinkish color directly adjacent to the operative site but noes evidence of spreading cellulitis.  It is slightly warmer to the touch than the surrounding area but it is not painful.  It is not fiery red.  The patient denies any tenderness at all.  There is serous drainage coming from the center of the wound where the skin has dehisced slightly however there is no pus that I can express from that area.  Patient has reported some mild dysuria since starting the antibiotics and is also having some pelvic itching.  I suspect a yeast infection.  Her INR today has improved to 1.6.  Obviously this is too soon to check an INR after making a change just 4 days ago.  Therefore I do believe the 7.5 will be the correct/appropriate dose.  However I also want to try to get her to therapeutic as quick as possible.  She would also like to check her cholesterol while she is here.  At that time, my plan was: I will screen the patient's cholesterol today and also check a CBC and a CMP per her request.  INR is subtherapeutic therefore I recommended that she bolused 2 mg of Coumadin today, 10 mg of Coumadin tomorrow and then maintain at 7.5 mg of Coumadin daily thereafter.  Recheck INR in 10 days to make gross adjustment if necessary.  Cellulitis seems to be improving.  She has 1 day left of Keflex.  I see no residual infection today.  I gave the patient warning signs of recurrent infection and when to come back but otherwise finished antibiotic and no further follow-up is necessary for that unless warning signs develop.  I suspect her dysuria is likely due to a yeast infection however I will check a urinalysis.  If negative I would recommend trying Diflucan for a yeast  infection  10/08/18 Patient is here today to recheck her INR.  INR is still subtherapeutic at 1.6.  Patient is taking 7.5 mg of Coumadin every day.  At her last visit, her hemoglobin had dropped from 10.6-8.6.  I believed this to be due to her chemotherapy  and bone marrow suppression.  However I was concerned due to the fact she is taking Coumadin and therefore I recommended a stool test to rule out bleeding.  Patient has yet to bring back a stool sample.  She denies any melena or hematochezia.  She denies any abdominal pain.  She is scheduled for chemo next week.  She is now 3 weeks out from chemo and therefore I would expect that this should have improved.  At that time, my plan was: Increase Coumadin to 10 mg on Monday, Wednesday, Friday, and Saturday.  Take 7.5 mg on Tuesday, Thursday, Sunday.  Recheck INR in 1 week.  Recheck CBC to monitor for further decline in her hemoglobin.  Although I believe the drop in her hemoglobin was likely bone marrow suppression secondary to her chemotherapy I do want to rule out an occult GI bleed given the fact that she is on Coumadin.  I will also check a fecal occult stool card as well.  If stool card is negative and hemoglobin is stable I feel no further work-up is necessary.  Patient notified her oncologist and per her report they felt that this was also likely the situation  11/01/18 INR was 3 on 6/1.  INR today is subtherapeutic at 1.6.  She is currently on 10 mg of Coumadin on Wednesday, Wednesday, Friday, and Saturday.  She takes 7.5 mg of Coumadin on Tuesday, Thursday, Sunday.  She is also asking for refill on her gabapentin.  She uses twice a day for nerve pain and neuropathy in her legs.  At that time, my plan was: INR is subtherapeutic.  Increase Coumadin to 10 mg on all days except Monday and Friday.  On Monday and Friday take 7.5 mg.  Recheck INR in 2 weeks.  Recent hemoglobin checked at [facility] was up to 10.6.  Therefore the drop in her hemoglobin was due to  bone marrow suppression from her chemotherapy.   11/11/18 INR today is therapeutic at 2.5.  Patient is currently taking 10 mg of Coumadin all days except Monday and Friday.  On Monday and Friday she is taking 7.5 mg.  Overall she is doing well with no concerns.  They are slowly weaning her down off her prednisone.  She is due for her next chemotherapy treatment in 4 weeks.  12/09/18 Here to recheck INR.  INR today is supratherapeutic at 3.9.  She has been taking 10 mg a day of Coumadin except Mondays and Fridays only she takes 7.5.  She denies any changes in her medication.  She does complain now of claudication in her right leg.  She describes cramping pain in her feet whenever she walks more than 100 feet.  This improves if she rest.  However she also reports burning pain in her right leg and numbness and tingling in her right leg.  She has a palpable femoral pulse today.  She has a palpable popliteal pulse today.  She has a palpable dorsalis pedis and posterior tibialis pulse today.  The extremity is warm and well-perfused.  Question is if this is neurogenic claudication versus arterial claudication.  AT that time, my plan was: Supratherapeutic.  No Coumadin today.  Then reduce her dose to 7.5 mg of Coumadin on Monday, Wednesday, Friday and 10 mg on other days and recheck in 4 weeks.  Obtain arterial Dopplers to evaluate for any evidence of arterial thrombus or peripheral vascular disease.  If no significant arterial occlusion is seen, would treat the patient for  neuropathic pain/neurogenic claudication.  If there is arterial thrombus, the patient would likely need to switch to Lovenox.  There is no evidence of limb threatening ischemia today on exam  01/10/19 INR today is 4.1.  Currently on coumadin 10 mg on M,W,F and Sun and 7.5 mg on all other days.  Denies any bleeding or bruising.  At that time, my plan was: Supertherapeutic INR.  Hold Coumadin for 48 hours.  Then resume Coumadin 7.5 mg daily and  recheck INR in 2 weeks.  01/25/19 INR is 1.4.  Only on 7.5 mg of coumadin 7.5 mg poqd.  She denies any bleeding or bruising.  She denies any antibiotic use or change in her diet.  She has been recommended to have an endoscopy by her specialist at [facility].  She is questioning how she should do that on Coumadin.  At that time, my plan was: Recommended increasing Coumadin to 10 mg on Wednesday and Sunday and 7.5 mg on all other days.  Recheck INR in 2 weeks.  Recommended that the patient schedule the endoscopy.  1 week prior to the endoscopy we would discontinue Coumadin and replace with Lovenox.  She could hold Lovenox 24 hours prior to endoscopy and then resume Coumadin immediately thereafter.  02/08/19 Patient is here today for follow-up.  INR is still subtherapeutic at 1.8 though much better than last time.  She has not missed any doses of Coumadin.  She does report pain in her left great toe.  On examination, the left great toe is thickened dysmorphic.  It is opaque and brown.  It is ingrown bilaterally and is causing her significant pain when she touches it and when she walks.  There is no evidence of infection.  She would like to see a podiatrist to have this removed.  For the dysmorphic left great toenail, I will consult podiatry to have the toenail removed particular given her compromised blood flow to the right leg and her history of neuropathy.  Regarding the arterial embolism in her right leg, I will increase her Coumadin to 10 mg every day except Monday Wednesday Friday.  On Monday Wednesday Friday she will take 7.5 mg a day.  Take an extra 5 mg today to help improve her INR to a therapeutic level.  Recheck INR in 2 weeks.  02/22/19 Patient is here today to recheck her INR.  INR is therapeutic at 2.5.  Patient denies any bleeding or bruising.  However she does feel like she needs to start an antidepressant.  She has a history of depression in the past is taken Zoloft as well as Effexor which she did  not tolerate well due to side effects.  She reports feeling depressed.  She reports anhedonia.  She is come to realization that she will likely not be able to be cured from her cancer.  She is also contemplating disability.  All this has the patient overwhelmed.  She reports trouble sleeping, poor concentration, and lack of energy.  She would also like a referral to a psychologist for counseling.  At that time, my plan was: INR is therapeutic.  Continue Coumadin 10 mg every day except Monday Wednesday and Friday and on those days use 7.5 mg.  Recheck INR in 6 weeks.  Begin Lexapro 10 mg a day for depression and reassess at her follow-up in 6 weeks.  I provided the patient contact information for [facility] to help arrange outpatient counseling/therapy sessions for her depression  04/05/19 Patient is  here today to recheck her INR.  She is currently on Coumadin 10 mg a day 4 days a week and 7-1/2 mg 3 days a week.  Today she took 10 mg.  Her INR is slightly supratherapeutic at 3.1.  She denies any bleeding or bruising.  She does report feeling lethargic however she attributes this to her change in chemotherapy drugs.  However we have not checked her thyroid since January of this year.  This is overdue.  During that timeframe the patient has developed an arterial thrombus, and is started chemotherapy.  Therefore her thyroid medication could very well require an adjustment.  Past Medical History:  Diagnosis Date   Anxiety    Arterial thrombosis (HCC)    right sfa, popliteal artery.  Require thromectomy at [facility] and lifelong anticoagulation.   Arthritis    Cancer ([place])    ovarian, recurrent on chemo (carboplatin, doxorubicin) at [facility]   Chest pain    Colon polyp    Constipation    Depression    Full dentures    GERD (gastroesophageal reflux disease)    Hemorrhoid    HLD (hyperlipidemia) 12/03/2012   Hyperlipidemia    Hypertension    Hypothyroidism    IBS  (irritable bowel syndrome)    Neck mass    Neuropathy    feet   Numbness and tingling in hands    Numbness in both legs    Palpitations    Schatzki's ring    Trouble swallowing    Wears glasses    Past Surgical History:  Procedure Laterality Date   ABDOMINAL HYSTERECTOMY     ABLATION     COLONOSCOPY     CYST REMOVAL NECK  05/13/12   EAR CYST EXCISION  05/13/2012   Procedure: CYST REMOVAL;  Surgeon: Ralene Ok, MD;  Location: [place];  Service: General;  Laterality: Left;  excision of neck cyst   THYROID SURGERY  10/2011 - approximate   ablation   TONSILLECTOMY     UPPER GI ENDOSCOPY     Current Outpatient Medications on File Prior to Visit  Medication Sig Dispense Refill   amLODipine (NORVASC) 10 MG tablet Take 1 tablet by mouth once daily 90 tablet 3   atorvastatin (LIPITOR) 80 MG tablet Take 80 mg by mouth daily.     Cholecalciferol (VITAMIN D3) 50 MCG (2000 UT) capsule      dexamethasone (DECADRON) 4 MG tablet Take 2 tablets (8 mg) by mouth daily with breakfast for 3 days after chemotherapy each cycle.  Do not take prednisone on the days you take dexamethasone.     diphenhydrAMINE (BENADRYL) 25 MG tablet Take 25 mg by mouth at bedtime as needed. For sleep     escitalopram (LEXAPRO) 10 MG tablet Take 1 tablet (10 mg total) by mouth daily. 30 tablet 5   gabapentin (NEURONTIN) 300 MG capsule Take 1 capsule (300 mg total) by mouth 2 (two) times daily. 60 capsule 11   hydrochlorothiazide (MICROZIDE) 12.5 MG capsule Take 1 capsule by mouth once daily 90 capsule 3   KLOR-CON M20 20 MEQ tablet Take 1 tablet by mouth once daily 90 tablet 1   leflunomide (ARAVA) 20 MG tablet Take 20 mg by mouth daily.     levothyroxine (SYNTHROID) 150 MCG tablet Take 1 tablet by mouth once daily 90 tablet 2   OLANZapine (ZYPREXA) 5 MG tablet Take 1 tablet by mouth nightly for 4 nights, starting on the day of chemotherapy each cycle.  olaparib (LYNPARZA) 150 MG tablet  Take by mouth.     ondansetron (ZOFRAN) 8 MG tablet Take by mouth.     predniSONE (DELTASONE) 1 MG tablet Take 4 mg by mouth daily.      vitamin B-12 (CYANOCOBALAMIN) 1000 MCG tablet Take 1,000 mcg by mouth daily.     warfarin (COUMADIN) 5 MG tablet Take 1.5 tablets (7.5 mg total) by mouth daily for 30 days. (Patient taking differently: Take by mouth daily. Coumadin to 10 mg every day x 4 days per week.  On Monday Wednesday Friday she will take 7.5 mg a day.) 135 tablet 3   No current facility-administered medications on file prior to visit.    Allergies  Allergen Reactions   Paclitaxel Other (See Comments)    Flushing, Itching, Throat tightness   Social History   Socioeconomic History   Marital status: Single    Spouse name: Not on file   Number of children: Not on file   Years of education: Not on file   Highest education level: Not on file  Occupational History   Not on file  Social Needs   Financial resource strain: Not on file   Food insecurity    Worry: Not on file    Inability: Not on file   Transportation needs    Medical: Not on file    Non-medical: Not on file  Tobacco Use   Smoking status: Former Smoker    Years: 32.00    Quit date: 06/19/2011    Years since quitting: 7.8   Smokeless tobacco: Never Used  Substance and Sexual Activity   Alcohol use: No   Drug use: No   Sexual activity: Yes  Lifestyle   Physical activity    Days per week: Not on file    Minutes per session: Not on file   Stress: Not on file  Relationships   Social connections    Talks on phone: Not on file    Gets together: Not on file    Attends religious service: Not on file    Active member of club or organization: Not on file    Attends meetings of clubs or organizations: Not on file    Relationship status: Not on file   Intimate partner violence    Fear of current or ex partner: Not on file    Emotionally abused: Not on file    Physically abused: Not on file      Forced sexual activity: Not on file  Other Topics Concern   Not on file  Social History Narrative   Not on file     Review of Systems  All other systems reviewed and are negative.      Objective:   Physical Exam Vitals signs reviewed.  Constitutional:      General: She is not in acute distress.    Appearance: Normal appearance. She is not ill-appearing or toxic-appearing.  Cardiovascular:     Rate and Rhythm: Normal rate and regular rhythm.     Pulses: Normal pulses.     Heart sounds: Normal heart sounds. No murmur. No friction rub. No gallop.   Pulmonary:     Effort: Pulmonary effort is normal. No respiratory distress.     Breath sounds: Normal breath sounds. No stridor. No wheezing, rhonchi or rales.  Skin:    Findings: No erythema.  Neurological:     Mental Status: She is alert.        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  I recommended that the patient eat more vitamin K today with her meal to bring it below 3 however I would not make any changes in her overall regimen as this is worked for her well the last few times we have checked it.  I would like to recheck a TSH and a BMP today to make sure that her Synthroid is dosed appropriately.

## 2019-04-06 LAB — BASIC METABOLIC PANEL WITH GFR
BUN: 11 mg/dL (ref 7–25)
CO2: 25 mmol/L (ref 20–32)
Calcium: 9.4 mg/dL (ref 8.6–10.4)
Chloride: 103 mmol/L (ref 98–110)
Creat: 0.94 mg/dL (ref 0.50–1.05)
GFR, Est African American: 77 mL/min/{1.73_m2} (ref >=60)
GFR, Est Non African American: 66 mL/min/{1.73_m2} (ref >=60)
Glucose, Bld: 105 mg/dL — ABNORMAL HIGH (ref 65–99)
Potassium: 3 mmol/L — ABNORMAL LOW (ref 3.5–5.3)
Sodium: 141 mmol/L (ref 135–146)

## 2019-04-06 LAB — TSH: TSH: 2.07 mIU/L (ref 0.40–4.50)

## 2019-04-20 ENCOUNTER — Other Ambulatory Visit: Payer: Managed Care, Other (non HMO)

## 2019-04-20 DIAGNOSIS — E875 Hyperkalemia: Secondary | ICD-10-CM

## 2019-04-21 ENCOUNTER — Other Ambulatory Visit: Payer: Managed Care, Other (non HMO)

## 2019-04-21 LAB — BASIC METABOLIC PANEL
BUN: 10 mg/dL (ref 7–25)
CO2: 28 mmol/L (ref 20–32)
Calcium: 9.2 mg/dL (ref 8.6–10.4)
Chloride: 104 mmol/L (ref 98–110)
Creat: 0.69 mg/dL (ref 0.50–1.05)
Glucose, Bld: 98 mg/dL (ref 65–99)
Potassium: 3.5 mmol/L (ref 3.5–5.3)
Sodium: 143 mmol/L (ref 135–146)

## 2019-04-29 ENCOUNTER — Other Ambulatory Visit: Payer: Self-pay | Admitting: Family Medicine

## 2019-04-29 DIAGNOSIS — E876 Hypokalemia: Secondary | ICD-10-CM

## 2019-05-03 ENCOUNTER — Other Ambulatory Visit: Payer: Managed Care, Other (non HMO)

## 2019-05-03 ENCOUNTER — Other Ambulatory Visit: Payer: Self-pay

## 2019-05-03 DIAGNOSIS — E876 Hypokalemia: Secondary | ICD-10-CM

## 2019-05-04 LAB — BASIC METABOLIC PANEL
BUN: 11 mg/dL (ref 7–25)
CO2: 26 mmol/L (ref 20–32)
Calcium: 9.3 mg/dL (ref 8.6–10.4)
Chloride: 104 mmol/L (ref 98–110)
Creat: 0.77 mg/dL (ref 0.50–1.05)
Glucose, Bld: 95 mg/dL (ref 65–99)
Potassium: 3.8 mmol/L (ref 3.5–5.3)
Sodium: 142 mmol/L (ref 135–146)

## 2019-05-17 ENCOUNTER — Other Ambulatory Visit: Payer: Self-pay

## 2019-05-17 ENCOUNTER — Ambulatory Visit (INDEPENDENT_AMBULATORY_CARE_PROVIDER_SITE_OTHER): Payer: Managed Care, Other (non HMO) | Admitting: Family Medicine

## 2019-05-17 VITALS — BP 120/84 | HR 70 | Temp 97.7°F | Resp 18 | Ht 67.5 in | Wt 238.0 lb

## 2019-05-17 DIAGNOSIS — I743 Embolism and thrombosis of arteries of the lower extremities: Secondary | ICD-10-CM

## 2019-05-17 LAB — PT WITH INR/FINGERSTICK
INR, fingerstick: 3.4 ratio — ABNORMAL HIGH
PT, fingerstick: 41.1 s — ABNORMAL HIGH (ref 10.5–13.1)

## 2019-05-17 NOTE — Progress Notes (Signed)
Subjective:    Patient ID: Donna [name], female    DOB: 04/16/1961, 58 y.o.   MRN: [id number]  HPI  Patient is here today to recheck her INR.  She is currently on Coumadin 10 mg a day 4 days a week and 7-1/2 mg 3 days a week.  Her INR is slightly supratherapeutic at 3.4.  She denies any bleeding or bruising.  Past Medical History:  Diagnosis Date  . Anxiety   . Arterial thrombosis (HCC)    right sfa, popliteal artery.  Require thromectomy at [facility] and lifelong anticoagulation.  . Arthritis   . Cancer ([place])    ovarian, recurrent on chemo (carboplatin, doxorubicin) at [facility]  . Chest pain   . Colon polyp   . Constipation   . Depression   . Full dentures   . GERD (gastroesophageal reflux disease)   . Hemorrhoid   . HLD (hyperlipidemia) 12/03/2012  . Hyperlipidemia   . Hypertension   . Hypothyroidism   . IBS (irritable bowel syndrome)   . Neck mass   . Neuropathy    feet  . Numbness and tingling in hands   . Numbness in both legs   . Palpitations   . Schatzki's ring   . Trouble swallowing   . Wears glasses    Past Surgical History:  Procedure Laterality Date  . ABDOMINAL HYSTERECTOMY    . ABLATION    . COLONOSCOPY    . CYST REMOVAL NECK  05/13/12  . EAR CYST EXCISION  05/13/2012   Procedure: CYST REMOVAL;  Surgeon: Ralene Ok, MD;  Location: [place];  Service: General;  Laterality: Left;  excision of neck cyst  . THYROID SURGERY  10/2011 - approximate   ablation  . TONSILLECTOMY    . UPPER GI ENDOSCOPY     Current Outpatient Medications on File Prior to Visit  Medication Sig Dispense Refill  . amLODipine (NORVASC) 10 MG tablet Take 1 tablet by mouth once daily 90 tablet 3  . atorvastatin (LIPITOR) 40 MG tablet Take 2 tablets (80 mg total) by mouth daily. 90 tablet 2  . Cholecalciferol (VITAMIN D3) 50 MCG (2000 UT) capsule     . diphenhydrAMINE (BENADRYL) 25 MG tablet Take 25 mg by mouth at bedtime as needed. For sleep    . escitalopram (LEXAPRO) 10 MG tablet Take  1 tablet (10 mg total) by mouth daily. 30 tablet 5  . gabapentin (NEURONTIN) 300 MG capsule Take 1 capsule (300 mg total) by mouth 2 (two) times daily. 60 capsule 11  . hydrochlorothiazide (MICROZIDE) 12.5 MG capsule Take 1 capsule by mouth once daily 90 capsule 3  . KLOR-CON M20 20 MEQ tablet Take 1 tablet by mouth once daily (Patient taking differently: 40 mEq. ) 90 tablet 1  . leflunomide (ARAVA) 20 MG tablet Take 20 mg by mouth daily.    Marland Kitchen levothyroxine (SYNTHROID) 150 MCG tablet Take 1 tablet by mouth once daily 90 tablet 2  . olaparib (LYNPARZA) 150 MG tablet Take by mouth.    . ondansetron (ZOFRAN) 8 MG tablet Take by mouth.    . vitamin B-12 (CYANOCOBALAMIN) 1000 MCG tablet Take 1,000 mcg by mouth daily.    Marland Kitchen warfarin (COUMADIN) 5 MG tablet Take 1.5 tablets (7.5 mg total) by mouth daily for 30 days. (Patient taking differently: Take by mouth daily. Coumadin to 10 mg every day x 4 days per week.  On Monday Wednesday Friday she will take 7.5 mg a day.) 135 tablet 3  .  atorvastatin (LIPITOR) 80 MG tablet Take 80 mg by mouth daily.    Marland Kitchen dexamethasone (DECADRON) 4 MG tablet Take 2 tablets (8 mg) by mouth daily with breakfast for 3 days after chemotherapy each cycle.  Do not take prednisone on the days you take dexamethasone.    Marland Kitchen OLANZapine (ZYPREXA) 5 MG tablet Take 1 tablet by mouth nightly for 4 nights, starting on the day of chemotherapy each cycle.    . predniSONE (DELTASONE) 1 MG tablet Take 4 mg by mouth daily.      No current facility-administered medications on file prior to visit.   Allergies  Allergen Reactions  . Paclitaxel Other (See Comments)    Flushing, Itching, Throat tightness   Social History   Socioeconomic History  . Marital status: Single    Spouse name: Not on file  . Number of children: Not on file  . Years of education: Not on file  . Highest education level: Not on file  Occupational History  . Not on file  Tobacco Use  . Smoking status: Former Smoker     Years: 32.00    Quit date: 06/19/2011    Years since quitting: 7.9  . Smokeless tobacco: Never Used  Substance and Sexual Activity  . Alcohol use: No  . Drug use: No  . Sexual activity: Yes  Other Topics Concern  . Not on file  Social History Narrative  . Not on file   Social Determinants of Health   Financial Resource Strain:   . Difficulty of Paying Living Expenses: Not on file  Food Insecurity:   . Worried About Charity fundraiser in the Last Year: Not on file  . Ran Out of Food in the Last Year: Not on file  Transportation Needs:   . Lack of Transportation (Medical): Not on file  . Lack of Transportation (Non-Medical): Not on file  Physical Activity:   . Days of Exercise per Week: Not on file  . Minutes of Exercise per Session: Not on file  Stress:   . Feeling of Stress : Not on file  Social Connections:   . Frequency of Communication with Friends and Family: Not on file  . Frequency of Social Gatherings with Friends and Family: Not on file  . Attends Religious Services: Not on file  . Active Member of Clubs or Organizations: Not on file  . Attends Archivist Meetings: Not on file  . Marital Status: Not on file  Intimate Partner Violence:   . Fear of Current or Ex-Partner: Not on file  . Emotionally Abused: Not on file  . Physically Abused: Not on file  . Sexually Abused: Not on file     Review of Systems  All other systems reviewed and are negative.      Objective:   Physical Exam Vitals reviewed.  Constitutional:      General: She is not in acute distress.    Appearance: Normal appearance. She is not ill-appearing or toxic-appearing.  Cardiovascular:     Rate and Rhythm: Normal rate and regular rhythm.     Pulses: Normal pulses.     Heart sounds: Normal heart sounds. No murmur. No friction rub. No gallop.   Pulmonary:     Effort: Pulmonary effort is normal. No respiratory distress.     Breath sounds: Normal breath sounds. No stridor. No  wheezing, rhonchi or rales.  Skin:    Findings: No erythema.  Neurological:     Mental Status: She is  alert.        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  Patient is currently taking 10 mg of Coumadin 4 days a week and 7.5 mg of Coumadin 3 days a week.  I have recommended that we reverse that schedule.  I would like her to take 7.5 mg of Coumadin 4 days a week on Monday, Wednesday, Friday, and Saturday.  I want her to take 10 mg of Coumadin 3 days a week on Tuesday, Thursday, and Sunday.  Recheck INR in 4 weeks.  She has also struggled to maintain her potassium despite taking 40 mEq a day of K. Dur.  With weight loss, her blood pressure today is well controlled.  Therefore I recommended that she discontinue hydrochlorothiazide.  Her potassium will be checked next week when she sees her oncologist at [facility].  Of asked the patient to monitor her blood pressure off the medication.

## 2019-06-14 ENCOUNTER — Encounter: Payer: Self-pay | Admitting: Family Medicine

## 2019-06-14 ENCOUNTER — Other Ambulatory Visit: Payer: Self-pay

## 2019-06-14 ENCOUNTER — Ambulatory Visit: Payer: Managed Care, Other (non HMO) | Admitting: Family Medicine

## 2019-06-14 VITALS — BP 120/72 | HR 66 | Temp 97.4°F | Resp 16 | Ht 67.5 in | Wt 237.0 lb

## 2019-06-14 DIAGNOSIS — H60319 Diffuse otitis externa, unspecified ear: Secondary | ICD-10-CM | POA: Diagnosis not present

## 2019-06-14 DIAGNOSIS — I743 Embolism and thrombosis of arteries of the lower extremities: Secondary | ICD-10-CM | POA: Diagnosis not present

## 2019-06-14 LAB — PT WITH INR/FINGERSTICK
INR, fingerstick: 2.8 ratio — ABNORMAL HIGH
PT, fingerstick: 33.3 s — ABNORMAL HIGH (ref 10.5–13.1)

## 2019-06-14 MED ORDER — CORTISPORIN-TC 3.3-3-10-0.5 MG/ML OT SUSP: 4.0000 [drp] | Freq: Four times a day (QID) | OTIC | 0 refills | Status: DC

## 2019-06-14 NOTE — Progress Notes (Signed)
Subjective:    Patient ID: Donna [name], female    DOB: 1960-07-13, 59 y.o.   MRN: [id number]  HPI 05/17/19  Patient is here today to recheck her INR.  She is currently on Coumadin 10 mg a day 4 days a week and 7-1/2 mg 3 days a week.  Her INR is slightly supratherapeutic at 3.4.  She denies any bleeding or bruising. At that time, my plan was: INR today is supratherapeutic at 3.4.  Patient is currently taking 10 mg of Coumadin 4 days a week and 7.5 mg of Coumadin 3 days a week.  I have recommended that we reverse that schedule.  I would like her to take 7.5 mg of Coumadin 4 days a week on Monday, Wednesday, Friday, and Saturday.  I want her to take 10 mg of Coumadin 3 days a week on Tuesday, Thursday, and Sunday.  Recheck INR in 4 weeks.  She has also struggled to maintain her potassium despite taking 40 mEq a day of K. Dur.  With weight loss, her blood pressure today is well controlled.  Therefore I recommended that she discontinue hydrochlorothiazide.  Her potassium will be checked next week when she sees her oncologist at [facility].  Of asked the patient to monitor her blood pressure off the medication.  06/14/19 Patient is here today to recheck her INR.  INR is therapeutic at 2.8.  However she reports pain in her right ear.  On examination today there is a cerumen impaction in her right auditory canal.  We attempted several times with irrigation and lavage and were unable to successfully remove it.  Therefore with assistance, I was able to remove the cerumen impaction using a pair of alligator forceps.  Behind the cerumen impaction, the auditory canal was erythematous and swollen with white exudate suggesting otitis externa.  The tympanic membrane itself appeared pearly gray and healthy. Past Medical History:  Diagnosis Date  . Anxiety   . Arterial thrombosis (HCC)    right sfa, popliteal artery.  Require thromectomy at [facility] and lifelong anticoagulation.  . Arthritis   . Cancer ([place])      ovarian, recurrent on chemo (carboplatin, doxorubicin) at [facility]  . Chest pain   . Colon polyp   . Constipation   . Depression   . Full dentures   . GERD (gastroesophageal reflux disease)   . Hemorrhoid   . HLD (hyperlipidemia) 12/03/2012  . Hyperlipidemia   . Hypertension   . Hypothyroidism   . IBS (irritable bowel syndrome)   . Neck mass   . Neuropathy    feet  . Numbness and tingling in hands   . Numbness in both legs   . Palpitations   . Schatzki's ring   . Trouble swallowing   . Wears glasses    Past Surgical History:  Procedure Laterality Date  . ABDOMINAL HYSTERECTOMY    . ABLATION    . COLONOSCOPY    . CYST REMOVAL NECK  05/13/12  . EAR CYST EXCISION  05/13/2012   Procedure: CYST REMOVAL;  Surgeon: Ralene Ok, MD;  Location: [place];  Service: General;  Laterality: Left;  excision of neck cyst  . THYROID SURGERY  10/2011 - approximate   ablation  . TONSILLECTOMY    . UPPER GI ENDOSCOPY     Current Outpatient Medications on File Prior to Visit  Medication Sig Dispense Refill  . amLODipine (NORVASC) 10 MG tablet Take 1 tablet by mouth once daily 90 tablet 3  .  atorvastatin (LIPITOR) 40 MG tablet Take 2 tablets (80 mg total) by mouth daily. 90 tablet 2  . atorvastatin (LIPITOR) 80 MG tablet Take 80 mg by mouth daily.    . Cholecalciferol (VITAMIN D3) 50 MCG (2000 UT) capsule     . dexamethasone (DECADRON) 4 MG tablet Take 2 tablets (8 mg) by mouth daily with breakfast for 3 days after chemotherapy each cycle.  Do not take prednisone on the days you take dexamethasone.    . diphenhydrAMINE (BENADRYL) 25 MG tablet Take 25 mg by mouth at bedtime as needed. For sleep    . escitalopram (LEXAPRO) 10 MG tablet Take 1 tablet (10 mg total) by mouth daily. 30 tablet 5  . gabapentin (NEURONTIN) 300 MG capsule Take 1 capsule (300 mg total) by mouth 2 (two) times daily. 60 capsule 11  . KLOR-CON M20 20 MEQ tablet Take 1 tablet by mouth once daily (Patient taking differently:  40 mEq. ) 90 tablet 1  . leflunomide (ARAVA) 20 MG tablet Take 20 mg by mouth daily.    Marland Kitchen levothyroxine (SYNTHROID) 150 MCG tablet Take 1 tablet by mouth once daily 90 tablet 2  . OLANZapine (ZYPREXA) 5 MG tablet Take 1 tablet by mouth nightly for 4 nights, starting on the day of chemotherapy each cycle.    . olaparib (LYNPARZA) 150 MG tablet Take by mouth.    . ondansetron (ZOFRAN) 8 MG tablet Take by mouth.    . predniSONE (DELTASONE) 1 MG tablet Take 4 mg by mouth daily.     . vitamin B-12 (CYANOCOBALAMIN) 1000 MCG tablet Take 1,000 mcg by mouth daily.    Marland Kitchen warfarin (COUMADIN) 5 MG tablet Take 1.5 tablets (7.5 mg total) by mouth daily for 30 days. (Patient taking differently: Take by mouth daily. Coumadin to 10 mg every day x 4 days per week.  On Monday Wednesday Friday she will take 7.5 mg a day.) 135 tablet 3   No current facility-administered medications on file prior to visit.   Allergies  Allergen Reactions  . Paclitaxel Other (See Comments)    Flushing, Itching, Throat tightness   Social History   Socioeconomic History  . Marital status: Single    Spouse name: Not on file  . Number of children: Not on file  . Years of education: Not on file  . Highest education level: Not on file  Occupational History  . Not on file  Tobacco Use  . Smoking status: Former Smoker    Years: 32.00    Quit date: 06/19/2011    Years since quitting: 7.9  . Smokeless tobacco: Never Used  Substance and Sexual Activity  . Alcohol use: No  . Drug use: No  . Sexual activity: Yes  Other Topics Concern  . Not on file  Social History Narrative  . Not on file   Social Determinants of Health   Financial Resource Strain:   . Difficulty of Paying Living Expenses: Not on file  Food Insecurity:   . Worried About Charity fundraiser in the Last Year: Not on file  . Ran Out of Food in the Last Year: Not on file  Transportation Needs:   . Lack of Transportation (Medical): Not on file  . Lack of  Transportation (Non-Medical): Not on file  Physical Activity:   . Days of Exercise per Week: Not on file  . Minutes of Exercise per Session: Not on file  Stress:   . Feeling of Stress : Not on file  Social Connections:   . Frequency of Communication with Friends and Family: Not on file  . Frequency of Social Gatherings with Friends and Family: Not on file  . Attends Religious Services: Not on file  . Active Member of Clubs or Organizations: Not on file  . Attends Archivist Meetings: Not on file  . Marital Status: Not on file  Intimate Partner Violence:   . Fear of Current or Ex-Partner: Not on file  . Emotionally Abused: Not on file  . Physically Abused: Not on file  . Sexually Abused: Not on file     Review of Systems  All other systems reviewed and are negative.      Objective:   Physical Exam Vitals reviewed.  Constitutional:      General: She is not in acute distress.    Appearance: Normal appearance. She is not ill-appearing or toxic-appearing.  HENT:     Right Ear: Tympanic membrane normal. Drainage, swelling and tenderness present. There is impacted cerumen.  Cardiovascular:     Rate and Rhythm: Normal rate and regular rhythm.     Pulses: Normal pulses.     Heart sounds: Normal heart sounds. No murmur. No friction rub. No gallop.   Pulmonary:     Effort: Pulmonary effort is normal. No respiratory distress.     Breath sounds: Normal breath sounds. No stridor. No wheezing, rhonchi or rales.  Skin:    Findings: No erythema.  Neurological:     Mental Status: She is alert.        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   I believe the cerumen impaction was acting as a damn that was trapping water in her auditory canal contributing to the pain and irritation.  This was removed with combination of irrigation and lavage and an alligator forceps.  Her INR is therapeutic today at 2.8.  Make no change in Coumadin dose and recheck INR in 6 weeks.

## 2019-06-24 ENCOUNTER — Ambulatory Visit: Payer: Managed Care, Other (non HMO) | Admitting: Podiatry

## 2019-06-24 ENCOUNTER — Other Ambulatory Visit: Payer: Self-pay

## 2019-06-24 DIAGNOSIS — M79674 Pain in right toe(s): Secondary | ICD-10-CM | POA: Diagnosis not present

## 2019-06-24 DIAGNOSIS — M79675 Pain in left toe(s): Secondary | ICD-10-CM | POA: Diagnosis not present

## 2019-06-24 DIAGNOSIS — B351 Tinea unguium: Secondary | ICD-10-CM | POA: Diagnosis not present

## 2019-06-24 DIAGNOSIS — L603 Nail dystrophy: Secondary | ICD-10-CM

## 2019-06-28 ENCOUNTER — Encounter: Payer: Self-pay | Admitting: Podiatry

## 2019-06-28 NOTE — Progress Notes (Signed)
  Subjective:  Patient ID: Donna [name], female    DOB: [birth year],  MRN: [id number]  Chief Complaint  Patient presents with  . Nail Problem    pt is here for toenail fungus, as well as a nail trim, pt is also currently on warfarin as well.   58 y.o. female returns for the above complaint.  Patient presents with thickened elongated mycotic toenails x10.  Patient would like to know if there is any further treatment for this.  However she has multiple systemic complications such as rheumatoid arthritis she had a has a history of malignant neoplasm currently chemotherapy.  She also is on warfarin.  She denies any other acute complaints.  Objective:   There were no vitals filed for this visit. General AA&O x3. Normal mood and affect.  Vascular Pedal pulses palpable.  Neurologic Epicritic sensation grossly intact.  Dermatologic No open lesions. Skin normal texture and turgor. Toenails x 10 elongated, thickened, dystrophic.  Orthopedic: Pain to palpation about the toenails.   Assessment & Plan:  Patient was evaluated and treated and all questions answered.  Onychomycosis with pain  -Nails palliatively debrided as below. -Educated on self-care -I extensively spoke to her about removing her right big toenail due to severe onychomycosis of the digit.  However I would like to be medical advice prior to doing so including her optimization of blood thinner.  I explained to her that there is no urgency to removing the toenail and we can discuss about it in the future.  Procedure: Nail Debridement Rationale: pain  Type of Debridement: manual, sharp debridement. Instrumentation: Nail nipper, rotary burr. Number of Nails: 10     No follow-ups on file.

## 2019-07-06 ENCOUNTER — Other Ambulatory Visit: Payer: Self-pay | Admitting: *Deleted

## 2019-07-06 MED ORDER — ATORVASTATIN CALCIUM 40 MG PO TABS: 80.0000 mg | ORAL_TABLET | Freq: Every day | ORAL | 2 refills | Status: DC

## 2019-07-25 ENCOUNTER — Encounter: Payer: Self-pay | Admitting: Family Medicine

## 2019-07-25 ENCOUNTER — Ambulatory Visit (INDEPENDENT_AMBULATORY_CARE_PROVIDER_SITE_OTHER): Payer: Managed Care, Other (non HMO) | Admitting: Family Medicine

## 2019-07-25 ENCOUNTER — Other Ambulatory Visit: Payer: Self-pay

## 2019-07-25 DIAGNOSIS — I743 Embolism and thrombosis of arteries of the lower extremities: Secondary | ICD-10-CM | POA: Diagnosis not present

## 2019-07-25 LAB — PT WITH INR/FINGERSTICK
INR, fingerstick: 3 ratio — ABNORMAL HIGH
PT, fingerstick: 36.1 s — ABNORMAL HIGH (ref 10.5–13.1)

## 2019-07-25 MED ORDER — LOSARTAN POTASSIUM 50 MG PO TABS: 50.0000 mg | ORAL_TABLET | Freq: Every day | ORAL | 3 refills | Status: DC

## 2019-07-25 NOTE — Progress Notes (Signed)
Subjective:    Patient ID: Donna [name], female    DOB: September 15, 1960, 59 y.o.   MRN: [id number]  HPI  Patient is here today to recheck her INR.  Her INR is therapeutic at 3.0.  She denies any bleeding or bruising.  Past Medical History:  Diagnosis Date  . Anxiety   . Arterial thrombosis (HCC)    right sfa, popliteal artery.  Require thromectomy at [facility] and lifelong anticoagulation.  . Arthritis   . Cancer ([place])    ovarian, recurrent on chemo (carboplatin, doxorubicin) at [facility]  . Chest pain   . Colon polyp   . Constipation   . Depression   . Full dentures   . GERD (gastroesophageal reflux disease)   . Hemorrhoid   . HLD (hyperlipidemia) 12/03/2012  . Hyperlipidemia   . Hypertension   . Hypothyroidism   . IBS (irritable bowel syndrome)   . Neck mass   . Neuropathy    feet  . Numbness and tingling in hands   . Numbness in both legs   . Palpitations   . Schatzki's ring   . Trouble swallowing   . Wears glasses    Past Surgical History:  Procedure Laterality Date  . ABDOMINAL HYSTERECTOMY    . ABLATION    . COLONOSCOPY    . CYST REMOVAL NECK  05/13/12  . EAR CYST EXCISION  05/13/2012   Procedure: CYST REMOVAL;  Surgeon: Ralene Ok, MD;  Location: [place];  Service: General;  Laterality: Left;  excision of neck cyst  . THYROID SURGERY  10/2011 - approximate   ablation  . TONSILLECTOMY    . UPPER GI ENDOSCOPY     Current Outpatient Medications on File Prior to Visit  Medication Sig Dispense Refill  . amLODipine (NORVASC) 10 MG tablet Take 1 tablet by mouth once daily 90 tablet 3  . atorvastatin (LIPITOR) 40 MG tablet Take 2 tablets (80 mg total) by mouth daily. 90 tablet 2  . Cholecalciferol (VITAMIN D3) 50 MCG (2000 UT) capsule     . dexamethasone (DECADRON) 4 MG tablet Take 2 tablets (8 mg) by mouth daily with breakfast for 3 days after chemotherapy each cycle.  Do not take prednisone on the days you take dexamethasone.    . diphenhydrAMINE (BENADRYL) 25 MG tablet  Take 25 mg by mouth at bedtime as needed. For sleep    . escitalopram (LEXAPRO) 10 MG tablet Take 1 tablet (10 mg total) by mouth daily. 30 tablet 5  . gabapentin (NEURONTIN) 300 MG capsule Take 1 capsule (300 mg total) by mouth 2 (two) times daily. 60 capsule 11  . KLOR-CON M20 20 MEQ tablet Take 1 tablet by mouth once daily (Patient taking differently: 40 mEq. ) 90 tablet 1  . leflunomide (ARAVA) 20 MG tablet Take 20 mg by mouth daily.    Marland Kitchen levothyroxine (SYNTHROID) 150 MCG tablet Take 1 tablet by mouth once daily 90 tablet 2  . neomycin-colistin-hydrocortisone-thonzonium (CORTISPORIN-TC) 3.07-19-08-0.5 MG/ML OTIC suspension Place 4 drops into the right ear 4 (four) times daily. 10 mL 0  . OLANZapine (ZYPREXA) 5 MG tablet Take 1 tablet by mouth nightly for 4 nights, starting on the day of chemotherapy each cycle.    . olaparib (LYNPARZA) 150 MG tablet Take by mouth.    . ondansetron (ZOFRAN) 8 MG tablet Take by mouth.    . vitamin B-12 (CYANOCOBALAMIN) 1000 MCG tablet Take 1,000 mcg by mouth daily.    Marland Kitchen warfarin (COUMADIN) 5 MG  tablet Take 1.5 tablets (7.5 mg total) by mouth daily for 30 days. (Patient taking differently: Take by mouth daily. Coumadin to 10 mg every day x 3 days per week.  7.5 mg x 4 days.) 135 tablet 3   No current facility-administered medications on file prior to visit.   Allergies  Allergen Reactions  . Paclitaxel Other (See Comments)    Flushing, Itching, Throat tightness   Social History   Socioeconomic History  . Marital status: Single    Spouse name: Not on file  . Number of children: Not on file  . Years of education: Not on file  . Highest education level: Not on file  Occupational History  . Not on file  Tobacco Use  . Smoking status: Former Smoker    Years: 32.00    Quit date: 06/19/2011    Years since quitting: 8.1  . Smokeless tobacco: Never Used  Substance and Sexual Activity  . Alcohol use: No  . Drug use: No  . Sexual activity: Yes  Other  Topics Concern  . Not on file  Social History Narrative  . Not on file   Social Determinants of Health   Financial Resource Strain:   . Difficulty of Paying Living Expenses: Not on file  Food Insecurity:   . Worried About Charity fundraiser in the Last Year: Not on file  . Ran Out of Food in the Last Year: Not on file  Transportation Needs:   . Lack of Transportation (Medical): Not on file  . Lack of Transportation (Non-Medical): Not on file  Physical Activity:   . Days of Exercise per Week: Not on file  . Minutes of Exercise per Session: Not on file  Stress:   . Feeling of Stress : Not on file  Social Connections:   . Frequency of Communication with Friends and Family: Not on file  . Frequency of Social Gatherings with Friends and Family: Not on file  . Attends Religious Services: Not on file  . Active Member of Clubs or Organizations: Not on file  . Attends Archivist Meetings: Not on file  . Marital Status: Not on file  Intimate Partner Violence:   . Fear of Current or Ex-Partner: Not on file  . Emotionally Abused: Not on file  . Physically Abused: Not on file  . Sexually Abused: Not on file     Review of Systems  All other systems reviewed and are negative.      Objective:   Physical Exam Vitals reviewed.  Constitutional:      General: She is not in acute distress.    Appearance: Normal appearance. She is not ill-appearing or toxic-appearing.  Cardiovascular:     Rate and Rhythm: Normal rate and regular rhythm.     Pulses: Normal pulses.     Heart sounds: Normal heart sounds. No murmur. No friction rub. No gallop.   Pulmonary:     Effort: Pulmonary effort is normal. No respiratory distress.     Breath sounds: Normal breath sounds. No stridor. No wheezing, rhonchi or rales.  Skin:    Findings: No erythema.  Neurological:     Mental Status: She is alert.        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 No change in coumadin dose.  Recheck in 6 weeks.

## 2019-07-26 ENCOUNTER — Ambulatory Visit: Payer: Managed Care, Other (non HMO) | Admitting: Family Medicine

## 2019-08-14 ENCOUNTER — Other Ambulatory Visit: Payer: Self-pay | Admitting: Family Medicine

## 2019-09-02 ENCOUNTER — Encounter: Payer: Self-pay | Admitting: Family Medicine

## 2019-09-02 ENCOUNTER — Ambulatory Visit (INDEPENDENT_AMBULATORY_CARE_PROVIDER_SITE_OTHER): Payer: Managed Care, Other (non HMO) | Admitting: Family Medicine

## 2019-09-02 ENCOUNTER — Other Ambulatory Visit: Payer: Self-pay

## 2019-09-02 VITALS — BP 112/68 | HR 63 | Temp 97.0°F | Resp 18 | Ht 67.5 in | Wt 234.0 lb

## 2019-09-02 DIAGNOSIS — E039 Hypothyroidism, unspecified: Secondary | ICD-10-CM | POA: Diagnosis not present

## 2019-09-02 DIAGNOSIS — R5382 Chronic fatigue, unspecified: Secondary | ICD-10-CM

## 2019-09-02 DIAGNOSIS — I10 Essential (primary) hypertension: Secondary | ICD-10-CM | POA: Diagnosis not present

## 2019-09-02 DIAGNOSIS — E7800 Pure hypercholesterolemia, unspecified: Secondary | ICD-10-CM

## 2019-09-02 DIAGNOSIS — I743 Embolism and thrombosis of arteries of the lower extremities: Secondary | ICD-10-CM

## 2019-09-02 LAB — PT WITH INR/FINGERSTICK
INR, fingerstick: 2.2 ratio — ABNORMAL HIGH
PT, fingerstick: 26.1 s — ABNORMAL HIGH (ref 10.5–13.1)

## 2019-09-02 NOTE — Progress Notes (Signed)
Subjective:    Patient ID: Donna [name], female    DOB: [birth year], 60 y.o.   MRN: [id number]  HPI  Patient is here today to recheck her INR.  Her INR is therapeutic at 2.2.  She denies any bleeding or bruising.  However she does report severe fatigue.  At times she reports orthostatic dizziness.  She denies any chest pain.  She denies any shortness of breath.  There is no evidence of any weight loss.  She denies any melena or hematochezia.  She is due to see her cancer doctor next week.  She has not had any recent imaging of the abdomen and pelvis to determine recurrence or spread.  She denies any bleeding or bruising that she is aware of Past Medical History:  Diagnosis Date  . Anxiety   . Arterial thrombosis (HCC)    right sfa, popliteal artery.  Require thromectomy at [facility] and lifelong anticoagulation.  . Arthritis   . Cancer ([place])    ovarian, recurrent on chemo (carboplatin, doxorubicin) at [facility]  . Chest pain   . Colon polyp   . Constipation   . Depression   . Full dentures   . GERD (gastroesophageal reflux disease)   . Hemorrhoid   . HLD (hyperlipidemia) 12/03/2012  . Hyperlipidemia   . Hypertension   . Hypothyroidism   . IBS (irritable bowel syndrome)   . Neck mass   . Neuropathy    feet  . Numbness and tingling in hands   . Numbness in both legs   . Palpitations   . Schatzki's ring   . Trouble swallowing   . Wears glasses    Past Surgical History:  Procedure Laterality Date  . ABDOMINAL HYSTERECTOMY    . ABLATION    . COLONOSCOPY    . CYST REMOVAL NECK  05/13/12  . EAR CYST EXCISION  05/13/2012   Procedure: CYST REMOVAL;  Surgeon: Ralene Ok, MD;  Location: [facility];  Service: General;  Laterality: Left;  excision of neck cyst  . THYROID SURGERY  10/2011 - approximate   ablation  . TONSILLECTOMY    . UPPER GI ENDOSCOPY     Current Outpatient Medications on File Prior to Visit  Medication Sig Dispense Refill  . amLODipine (NORVASC) 10 MG tablet Take 1  tablet by mouth once daily 90 tablet 3  . atorvastatin (LIPITOR) 40 MG tablet Take 2 tablets (80 mg total) by mouth daily. 90 tablet 2  . Cholecalciferol (VITAMIN D3) 50 MCG (2000 UT) capsule     . dexamethasone (DECADRON) 4 MG tablet Take 2 tablets (8 mg) by mouth daily with breakfast for 3 days after chemotherapy each cycle.  Do not take prednisone on the days you take dexamethasone.    . diphenhydrAMINE (BENADRYL) 25 MG tablet Take 25 mg by mouth at bedtime as needed. For sleep    . escitalopram (LEXAPRO) 10 MG tablet Take 1 tablet (10 mg total) by mouth daily. 30 tablet 5  . gabapentin (NEURONTIN) 300 MG capsule Take 1 capsule (300 mg total) by mouth 2 (two) times daily. 60 capsule 11  . KLOR-CON M20 20 MEQ tablet Take 1 tablet by mouth once daily (Patient taking differently: 40 mEq. ) 90 tablet 1  . leflunomide (ARAVA) 20 MG tablet Take 20 mg by mouth daily.    Marland Kitchen levothyroxine (SYNTHROID) 150 MCG tablet Take 1 tablet by mouth once daily 90 tablet 2  . losartan (COZAAR) 50 MG tablet Take 1 tablet (50  mg total) by mouth daily. 90 tablet 3  . neomycin-colistin-hydrocortisone-thonzonium (CORTISPORIN-TC) 3.07-19-08-0.5 MG/ML OTIC suspension Place 4 drops into the right ear 4 (four) times daily. 10 mL 0  . OLANZapine (ZYPREXA) 5 MG tablet Take 1 tablet by mouth nightly for 4 nights, starting on the day of chemotherapy each cycle.    . olaparib (LYNPARZA) 150 MG tablet Take by mouth.    . ondansetron (ZOFRAN) 8 MG tablet Take by mouth.    . vitamin B-12 (CYANOCOBALAMIN) 1000 MCG tablet Take 1,000 mcg by mouth daily.    Marland Kitchen warfarin (COUMADIN) 5 MG tablet TAKE 1 & 1/2 (ONE & ONE-HALF) TABLETS BY MOUTH ONCE DAILY (Patient taking differently: T,th, sat sun 1.5 (7.5) tab 4 days per week  2 (10mg ) tab other days) 135 tablet 0   No current facility-administered medications on file prior to visit.   Allergies  Allergen Reactions  . Paclitaxel Other (See Comments)    Flushing, Itching, Throat tightness    Social History   Socioeconomic History  . Marital status: Single    Spouse name: Not on file  . Number of children: Not on file  . Years of education: Not on file  . Highest education level: Not on file  Occupational History  . Not on file  Tobacco Use  . Smoking status: Former Smoker    Years: 32.00    Quit date: 06/19/2011    Years since quitting: 8.2  . Smokeless tobacco: Never Used  Substance and Sexual Activity  . Alcohol use: No  . Drug use: No  . Sexual activity: Yes  Other Topics Concern  . Not on file  Social History Narrative  . Not on file   Social Determinants of Health   Financial Resource Strain:   . Difficulty of Paying Living Expenses:   Food Insecurity:   . Worried About Charity fundraiser in the Last Year:   . Arboriculturist in the Last Year:   Transportation Needs:   . Film/video editor (Medical):   Marland Kitchen Lack of Transportation (Non-Medical):   Physical Activity:   . Days of Exercise per Week:   . Minutes of Exercise per Session:   Stress:   . Feeling of Stress :   Social Connections:   . Frequency of Communication with Friends and Family:   . Frequency of Social Gatherings with Friends and Family:   . Attends Religious Services:   . Active Member of Clubs or Organizations:   . Attends Archivist Meetings:   Marland Kitchen Marital Status:   Intimate Partner Violence:   . Fear of Current or Ex-Partner:   . Emotionally Abused:   Marland Kitchen Physically Abused:   . Sexually Abused:      Review of Systems  All other systems reviewed and are negative.      Objective:   Physical Exam Vitals reviewed.  Constitutional:      General: She is not in acute distress.    Appearance: Normal appearance. She is not ill-appearing or toxic-appearing.  Cardiovascular:     Rate and Rhythm: Normal rate and regular rhythm.     Pulses: Normal pulses.     Heart sounds: Normal heart sounds. No murmur. No friction rub. No gallop.   Pulmonary:     Effort:  Pulmonary effort is normal. No respiratory distress.     Breath sounds: Normal breath sounds. No stridor. No wheezing, rhonchi or rales.  Skin:    Findings: No erythema.  Neurological:     Mental Status: She is alert.        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 No change in coumadin dose.  Recheck in 6 weeks.  Wt Readings from Last 3 Encounters:  09/02/19 234 lb (106.1 kg)  07/25/19 232 lb (105.2 kg)  06/14/19 237 lb (107.5 kg)  Patient's fatigue is likely multifactorial.  I will obtain a TSH to ensure that she is properly dosed on her levothyroxine.  I will check a CBC to rule out any anemia secondary to blood loss or vitamin deficiencies.  I am also concerned about possible bone marrow suppression.  Certainly monitor her liver and kidney test with a CMP.  She has a history of hypokalemia and therefore we will also recheck her potassium level.  Given her issues, I would also check a vitamin B12 level to make sure that she does not have vitamin B12 deficiency.  Of all the lab work is completely normal, the next concern would be possible cancer recurrence.  We discussed this today in detail.  She may be due for repeat imaging of the abdomen and pelvis to her oncologist.  Await results of lab work first however.

## 2019-09-03 LAB — COMPLETE METABOLIC PANEL WITH GFR
AG Ratio: 2 (calc) (ref 1.0–2.5)
ALT: 11 U/L (ref 6–29)
AST: 11 U/L (ref 10–35)
Albumin: 4 g/dL (ref 3.6–5.1)
Alkaline phosphatase (APISO): 99 U/L (ref 37–153)
BUN: 11 mg/dL (ref 7–25)
CO2: 28 mmol/L (ref 20–32)
Calcium: 9.4 mg/dL (ref 8.6–10.4)
Chloride: 105 mmol/L (ref 98–110)
Creat: 0.77 mg/dL (ref 0.50–0.99)
GFR, Est African American: 97 mL/min/{1.73_m2} (ref >=60)
GFR, Est Non African American: 84 mL/min/{1.73_m2} (ref >=60)
Globulin: 2 g/dL (calc) (ref 1.9–3.7)
Glucose, Bld: 110 mg/dL — ABNORMAL HIGH (ref 65–99)
Potassium: 3.6 mmol/L (ref 3.5–5.3)
Sodium: 142 mmol/L (ref 135–146)
Total Bilirubin: 0.4 mg/dL (ref 0.2–1.2)
Total Protein: 6 g/dL — ABNORMAL LOW (ref 6.1–8.1)

## 2019-09-03 LAB — CBC WITH DIFFERENTIAL/PLATELET
Absolute Monocytes: 420 cells/uL (ref 200–950)
Basophils Absolute: 42 cells/uL (ref 0–200)
Basophils Relative: 0.6 %
Eosinophils Absolute: 147 cells/uL (ref 15–500)
Eosinophils Relative: 2.1 %
HCT: 35.8 % (ref 35.0–45.0)
Hemoglobin: 12.2 g/dL (ref 11.7–15.5)
Lymphs Abs: 1498 cells/uL (ref 850–3900)
MCH: 31.1 pg (ref 27.0–33.0)
MCHC: 34.1 g/dL (ref 32.0–36.0)
MCV: 91.3 fL (ref 80.0–100.0)
MPV: 9 fL (ref 7.5–12.5)
Monocytes Relative: 6 %
Neutro Abs: 4893 cells/uL (ref 1500–7800)
Neutrophils Relative %: 69.9 %
Platelets: 286 10*3/uL (ref 140–400)
RBC: 3.92 10*6/uL (ref 3.80–5.10)
RDW: 13.1 % (ref 11.0–15.0)
Total Lymphocyte: 21.4 %
WBC: 7 10*3/uL (ref 3.8–10.8)

## 2019-09-03 LAB — LIPID PANEL
Cholesterol: 171 mg/dL (ref <200)
HDL: 29 mg/dL — ABNORMAL LOW (ref >=50)
LDL Cholesterol (Calc): 108 mg/dL (calc) — ABNORMAL HIGH
Non-HDL Cholesterol (Calc): 142 mg/dL (calc) — ABNORMAL HIGH (ref <130)
Total CHOL/HDL Ratio: 5.9 (calc) — ABNORMAL HIGH (ref <5.0)
Triglycerides: 220 mg/dL — ABNORMAL HIGH (ref <150)

## 2019-09-03 LAB — TSH: TSH: 6.7 mIU/L — ABNORMAL HIGH (ref 0.40–4.50)

## 2019-09-03 LAB — VITAMIN B12: Vitamin B-12: 598 pg/mL (ref 200–1100)

## 2019-09-05 ENCOUNTER — Other Ambulatory Visit: Payer: Self-pay | Admitting: Family Medicine

## 2019-09-05 ENCOUNTER — Ambulatory Visit: Payer: Managed Care, Other (non HMO) | Admitting: Family Medicine

## 2019-09-05 MED ORDER — LEVOTHYROXINE SODIUM 175 MCG PO TABS: 175.0000 ug | ORAL_TABLET | Freq: Every day | ORAL | 1 refills | Status: DC

## 2019-09-30 ENCOUNTER — Encounter: Payer: Self-pay | Admitting: Family Medicine

## 2019-09-30 DIAGNOSIS — M069 Rheumatoid arthritis, unspecified: Secondary | ICD-10-CM | POA: Insufficient documentation

## 2019-10-15 ENCOUNTER — Other Ambulatory Visit: Payer: Self-pay | Admitting: Family Medicine

## 2019-10-21 ENCOUNTER — Other Ambulatory Visit: Payer: Self-pay | Admitting: Family Medicine

## 2020-01-09 ENCOUNTER — Other Ambulatory Visit: Payer: Self-pay | Admitting: Family Medicine

## 2020-01-13 ENCOUNTER — Ambulatory Visit: Payer: Managed Care, Other (non HMO) | Admitting: Podiatry

## 2020-01-13 ENCOUNTER — Encounter: Payer: Self-pay | Admitting: Podiatry

## 2020-01-13 ENCOUNTER — Other Ambulatory Visit: Payer: Self-pay

## 2020-01-13 ENCOUNTER — Ambulatory Visit (INDEPENDENT_AMBULATORY_CARE_PROVIDER_SITE_OTHER): Payer: Managed Care, Other (non HMO)

## 2020-01-13 DIAGNOSIS — M79675 Pain in left toe(s): Secondary | ICD-10-CM

## 2020-01-13 DIAGNOSIS — S9031XA Contusion of right foot, initial encounter: Secondary | ICD-10-CM

## 2020-01-13 DIAGNOSIS — B351 Tinea unguium: Secondary | ICD-10-CM | POA: Diagnosis not present

## 2020-01-13 DIAGNOSIS — Q666 Other congenital valgus deformities of feet: Secondary | ICD-10-CM | POA: Diagnosis not present

## 2020-01-13 DIAGNOSIS — M79674 Pain in right toe(s): Secondary | ICD-10-CM

## 2020-01-13 NOTE — Progress Notes (Signed)
  Subjective:  Patient ID: Donna [name], female    DOB: 05-29-1960,  MRN: [id number]  Chief Complaint  Patient presents with  . Foot Pain    Pt states she has been having issues with Rt foot- she had a blood clot in April 2020 had it removed and since then she has been issues with foot- furthe evaluation    59 y.o. female returns for the above complaint.  Patient presents with thickened elongated mycotic toenails x10.  Patient would like to know if there is any further treatment for this.  Patient would like to discuss her flatfoot as well as generalized arch pain from long period of standing.  She denies doing anything for them.  She did not make any shoe gear modification.  She has not gotten orthotics in the past.  Objective:   There were no vitals filed for this visit. General AA&O x3. Normal mood and affect.  Vascular Pedal pulses palpable.  Neurologic Epicritic sensation grossly intact.  Dermatologic No open lesions. Skin normal texture and turgor. Toenails x 10 elongated, thickened, dystrophic.  Orthopedic: Pain to palpation about the toenails.  Gait examination shows calcaneal valgus with too many toe sign partially able to recruit the arch with dorsiflexion of the hallux.  Radiographs: 3 views of skeletally mature adult right foot: No fractures noted.  No bony abnormalities noted.  No soft tissue emphysema noted. Assessment & Plan:  Patient was evaluated and treated and all questions answered.  Right second digit toe contusion -At this time clinically the toe is improving.  However she has mild pain on palpation.  Radiographically patient does not have any underlying x-ray.  I encouraged buddy splinting however she does not have any ecchymosis and pain is improving she does not need any kind of surgical intervention.  Patient states understanding  Semiflexible pes planovalgus bilaterally -I explained patient the etiology of pes planovalgus and various treatment options were  extensively discussed.  Given that patient has generalized arch pain from.  Of longstanding as well as the heel pain I believe patient will benefit from custom-made orthotics to help support the arch of the foot address the hindfoot motion and take the stress off of the arch.  Patient agrees with the plan like to obtain custom-made orthotics. -Patient was casted for orthotics.  Onychomycosis with pain  -Nails palliatively debrided as below. -Educated on self-care -I extensively spoke to her about removing her right big toenail due to severe onychomycosis of the digit.  However I would like to be medical advice prior to doing so including her optimization of blood thinner.  I explained to her that there is no urgency to removing the toenail and we can discuss about it in the future.  Procedure: Nail Debridement Rationale: pain  Type of Debridement: manual, sharp debridement. Instrumentation: Nail nipper, rotary burr. Number of Nails: 10     Return for Sched with Liliane Channel for [employer].

## 2020-01-16 ENCOUNTER — Other Ambulatory Visit: Payer: Self-pay

## 2020-01-16 ENCOUNTER — Ambulatory Visit: Payer: Managed Care, Other (non HMO) | Admitting: Family Medicine

## 2020-01-16 VITALS — BP 110/72 | HR 72 | Temp 97.6°F | Ht 67.0 in | Wt 235.0 lb

## 2020-01-16 DIAGNOSIS — E039 Hypothyroidism, unspecified: Secondary | ICD-10-CM

## 2020-01-16 DIAGNOSIS — I743 Embolism and thrombosis of arteries of the lower extremities: Secondary | ICD-10-CM | POA: Diagnosis not present

## 2020-01-16 LAB — TSH: TSH: 3.54 mIU/L (ref 0.40–4.50)

## 2020-01-16 LAB — PT WITH INR/FINGERSTICK
INR, fingerstick: 1.8 ratio — ABNORMAL HIGH
PT, fingerstick: 22.2 s — ABNORMAL HIGH (ref 10.5–13.1)

## 2020-01-16 MED ORDER — OMEPRAZOLE 40 MG PO CPDR: 40.0000 mg | DELAYED_RELEASE_CAPSULE | Freq: Two times a day (BID) | ORAL | 3 refills | Status: DC

## 2020-01-16 NOTE — Progress Notes (Signed)
Subjective:    Patient ID: Donna [name], female    DOB: 03/03/1961, 59 y.o.   MRN: [id number]  HPI Patient has a history of arterial thrombus in the popliteal artery on the right side.  This was most likely due to her hypercoagulable state brought on by ovarian cancer.  She is currently on Coumadin however no one has checked her INR since April!.  Today I explained to the patient that she must check her INR every 6 weeks.  INR today is slightly subtherapeutic at 1.8.  She is scheduled to take 10 mg.  She usually takes it first thing in the morning however she forgot today's dose.  She is also getting scheduled to have an EGD.  She will need to quit the Coumadin 5 days prior to the EGD.  Today we discussed whether she should use a Lovenox bridge prior to her EGD.  She would be at risk for recurrent thrombus formation off the Coumadin as she still is battling cancer and therefore is in a hypercoagulable state.  However it would only be for 5 days and then she could resume her Coumadin at his previous dose.  I explained to the patient that I believe her risk is relatively low and I believe it would be safe for her simply to take the Coumadin and discontinue 5 days prior to the procedure and then resume immediately thereafter.  However we did discuss the alternative of Lovenox bridging.  She will consider both options and then let me know her decision.  Past Medical History:  Diagnosis Date  . Anxiety   . Arterial thrombosis (HCC)    right sfa, popliteal artery.  Require thromectomy at [facility] and lifelong anticoagulation.  . Cancer ([place])    ovarian, recurrent on chemo (carboplatin, doxorubicin) at [facility]  . Colon polyp   . Depression   . Full dentures   . GERD (gastroesophageal reflux disease)   . HLD (hyperlipidemia) 12/03/2012  . Hypertension   . Hypothyroidism   . IBS (irritable bowel syndrome)   . Neck mass   . Neuropathy    feet  . Rheumatoid arthritis ([place])    at [facility]  . Schatzki's ring    . Wears glasses    Past Surgical History:  Procedure Laterality Date  . ABDOMINAL HYSTERECTOMY    . ABLATION    . COLONOSCOPY    . CYST REMOVAL NECK  05/13/12  . EAR CYST EXCISION  05/13/2012   Procedure: CYST REMOVAL;  Surgeon: Ralene Ok, MD;  Location: [facility];  Service: General;  Laterality: Left;  excision of neck cyst  . THYROID SURGERY  10/2011 - approximate   ablation  . TONSILLECTOMY    . UPPER GI ENDOSCOPY     Current Outpatient Medications on File Prior to Visit  Medication Sig Dispense Refill  . amLODipine (NORVASC) 10 MG tablet Take 1 tablet by mouth once daily 90 tablet 0  . atorvastatin (LIPITOR) 40 MG tablet Take 2 tablets (80 mg total) by mouth daily. 90 tablet 2  . Cholecalciferol (VITAMIN D3) 50 MCG (2000 UT) capsule     . dexamethasone (DECADRON) 4 MG tablet Take 2 tablets (8 mg) by mouth daily with breakfast for 3 days after chemotherapy each cycle.  Do not take prednisone on the days you take dexamethasone.    . diphenhydrAMINE (BENADRYL) 25 MG tablet Take 25 mg by mouth at bedtime as needed. For sleep    . escitalopram (LEXAPRO) 10 MG tablet  Take 1 tablet (10 mg total) by mouth daily. 30 tablet 5  . gabapentin (NEURONTIN) 300 MG capsule Take 1 capsule (300 mg total) by mouth 2 (two) times daily. 60 capsule 11  . KLOR-CON M20 20 MEQ tablet Take 1 tablet by mouth once daily (Patient taking differently: 40 mEq. ) 90 tablet 1  . leflunomide (ARAVA) 20 MG tablet Take 20 mg by mouth daily.    Marland Kitchen levothyroxine (SYNTHROID) 175 MCG tablet Take 1 tablet (175 mcg total) by mouth daily before breakfast. 90 tablet 1  . losartan (COZAAR) 50 MG tablet Take 1 tablet (50 mg total) by mouth daily. 90 tablet 3  . neomycin-colistin-hydrocortisone-thonzonium (CORTISPORIN-TC) 3.07-19-08-0.5 MG/ML OTIC suspension Place 4 drops into the right ear 4 (four) times daily. 10 mL 0  . OLANZapine (ZYPREXA) 5 MG tablet Take 1 tablet by mouth nightly for 4 nights, starting on the day of  chemotherapy each cycle.    . olaparib (LYNPARZA) 150 MG tablet Take by mouth.    . ondansetron (ZOFRAN) 8 MG tablet Take by mouth.    . vitamin B-12 (CYANOCOBALAMIN) 1000 MCG tablet Take 1,000 mcg by mouth daily.    Marland Kitchen warfarin (COUMADIN) 5 MG tablet T,th, sat sun 1.5 (7.5) tab 4 days per week  2 (10mg ) tab other days 135 tablet 0   No current facility-administered medications on file prior to visit.   Allergies  Allergen Reactions  . Paclitaxel Other (See Comments)    Flushing, Itching, Throat tightness   Social History   Socioeconomic History  . Marital status: Single    Spouse name: Not on file  . Number of children: Not on file  . Years of education: Not on file  . Highest education level: Not on file  Occupational History  . Not on file  Tobacco Use  . Smoking status: Former Smoker    Years: 32.00    Quit date: 06/19/2011    Years since quitting: 8.5  . Smokeless tobacco: Never Used  Vaping Use  . Vaping Use: Never used  Substance and Sexual Activity  . Alcohol use: No  . Drug use: No  . Sexual activity: Yes  Other Topics Concern  . Not on file  Social History Narrative  . Not on file   Social Determinants of Health   Financial Resource Strain:   . Difficulty of Paying Living Expenses: Not on file  Food Insecurity:   . Worried About Charity fundraiser in the Last Year: Not on file  . Ran Out of Food in the Last Year: Not on file  Transportation Needs:   . Lack of Transportation (Medical): Not on file  . Lack of Transportation (Non-Medical): Not on file  Physical Activity:   . Days of Exercise per Week: Not on file  . Minutes of Exercise per Session: Not on file  Stress:   . Feeling of Stress : Not on file  Social Connections:   . Frequency of Communication with Friends and Family: Not on file  . Frequency of Social Gatherings with Friends and Family: Not on file  . Attends Religious Services: Not on file  . Active Member of Clubs or Organizations: Not  on file  . Attends Archivist Meetings: Not on file  . Marital Status: Not on file  Intimate Partner Violence:   . Fear of Current or Ex-Partner: Not on file  . Emotionally Abused: Not on file  . Physically Abused: Not on file  . Sexually  Abused: Not on file     Review of Systems  All other systems reviewed and are negative.      Objective:   Physical Exam Vitals reviewed.  Constitutional:      General: She is not in acute distress.    Appearance: Normal appearance. She is not ill-appearing or toxic-appearing.  Cardiovascular:     Rate and Rhythm: Normal rate and regular rhythm.     Pulses: Normal pulses.     Heart sounds: Normal heart sounds. No murmur heard.  No friction rub. No gallop.   Pulmonary:     Effort: Pulmonary effort is normal. No respiratory distress.     Breath sounds: Normal breath sounds. No stridor. No wheezing, rhonchi or rales.  Skin:    Findings: No erythema.  Neurological:     Mental Status: She is alert.        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  Take 10 mg when she gets home immediately and then resume her normal schedule which is 10 mg on Monday Wednesday Friday and 7.5 mg on Tuesday, Thursday, Saturday, Sunday.  Recheck INR in 6 weeks.  Patient will decide between holding Coumadin 5 days prior to her EGD although with a high risk of recurrent thrombus formation over bridging with Lovenox.  She will call me back 2 weeks prior to the procedure with her final decision.  Meanwhile check TSH.

## 2020-01-31 ENCOUNTER — Other Ambulatory Visit: Payer: Self-pay | Admitting: Family Medicine

## 2020-02-07 ENCOUNTER — Ambulatory Visit: Payer: Managed Care, Other (non HMO) | Admitting: Orthotics

## 2020-02-07 ENCOUNTER — Other Ambulatory Visit: Payer: Self-pay

## 2020-02-07 DIAGNOSIS — S9031XA Contusion of right foot, initial encounter: Secondary | ICD-10-CM

## 2020-02-07 DIAGNOSIS — Q666 Other congenital valgus deformities of feet: Secondary | ICD-10-CM

## 2020-02-07 NOTE — Progress Notes (Signed)
Patient came in today to pick up custom made foot orthotics.  The goals were accomplished and the patient reported no dissatisfaction with said orthotics.  Patient was advised of breakin period and how to report any issues. 

## 2020-02-27 ENCOUNTER — Other Ambulatory Visit: Payer: Self-pay

## 2020-02-27 ENCOUNTER — Ambulatory Visit (INDEPENDENT_AMBULATORY_CARE_PROVIDER_SITE_OTHER): Payer: Managed Care, Other (non HMO) | Admitting: Family Medicine

## 2020-02-27 VITALS — BP 124/78 | HR 70 | Temp 98.0°F | Ht 67.0 in | Wt 231.0 lb

## 2020-02-27 DIAGNOSIS — Z7901 Long term (current) use of anticoagulants: Secondary | ICD-10-CM | POA: Diagnosis not present

## 2020-02-27 DIAGNOSIS — I743 Embolism and thrombosis of arteries of the lower extremities: Secondary | ICD-10-CM | POA: Diagnosis not present

## 2020-02-27 DIAGNOSIS — I749 Embolism and thrombosis of unspecified artery: Secondary | ICD-10-CM

## 2020-02-27 LAB — PT WITH INR/FINGERSTICK
INR, fingerstick: 1.6 ratio — ABNORMAL HIGH
PT, fingerstick: 19.5 s — ABNORMAL HIGH (ref 10.5–13.1)

## 2020-02-27 NOTE — Addendum Note (Signed)
Addended by: Saundra Shelling on: 02/27/2020 04:33 PM   Modules accepted: Orders

## 2020-02-27 NOTE — Progress Notes (Signed)
Subjective:    Patient ID: Donna [name], female    DOB: 1961/01/21, 60 y.o.   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  This was most likely due to her hypercoagulable state brought on by ovarian cancer.  Today I explained to the patient that she must check her INR every 6 weeks.  INR today is subtherapeutic at 1.6.  She states that her INR last time she was at [facility] was normal between 2 and 3 however that was before she started chemo last week.  Over the last week she has been extremely nauseated and has had times of diarrhea.  She is currently on Coumadin 10 mg on Monday Wednesday and Friday and 7 and half milligrams on all other days.  She has not taken her Coumadin today and she threw up yesterday's dose Past Medical History:  Diagnosis Date  . Anxiety   . Arterial thrombosis (HCC)    right sfa, popliteal artery.  Require thromectomy at [facility] and lifelong anticoagulation.  . Cancer ([place])    ovarian, recurrent on chemo (carboplatin, doxorubicin) at [facility]  . Colon polyp   . Depression   . Full dentures   . GERD (gastroesophageal reflux disease)   . HLD (hyperlipidemia) 12/03/2012  . Hypertension   . Hypothyroidism   . IBS (irritable bowel syndrome)   . Neck mass   . Neuropathy    feet  . Rheumatoid arthritis ([place])    at [facility]  . Schatzki's ring   . Wears glasses    Past Surgical History:  Procedure Laterality Date  . ABDOMINAL HYSTERECTOMY    . ABLATION    . COLONOSCOPY    . CYST REMOVAL NECK  05/13/12  . EAR CYST EXCISION  05/13/2012   Procedure: CYST REMOVAL;  Surgeon: Ralene Ok, MD;  Location: [place];  Service: General;  Laterality: Left;  excision of neck cyst  . THYROID SURGERY  10/2011 - approximate   ablation  . TONSILLECTOMY    . UPPER GI ENDOSCOPY     Current Outpatient Medications on File Prior to Visit  Medication Sig Dispense Refill  . amLODipine (NORVASC) 10 MG tablet Take 1 tablet by mouth  once daily 90 tablet 0  . atorvastatin (LIPITOR) 40 MG tablet Take 2 tablets (80 mg total) by mouth daily. 90 tablet 2  . Cholecalciferol (VITAMIN D3) 50 MCG (2000 UT) capsule     . dexamethasone (DECADRON) 4 MG tablet Take 2 tablets (8 mg) by mouth daily with breakfast for 3 days after chemotherapy each cycle.  Do not take prednisone on the days you take dexamethasone.    . diphenhydrAMINE (BENADRYL) 25 MG tablet Take 25 mg by mouth at bedtime as needed. For sleep    . escitalopram (LEXAPRO) 10 MG tablet Take 1 tablet (10 mg total) by mouth daily. 30 tablet 5  . gabapentin (NEURONTIN) 300 MG capsule Take 1 capsule (300 mg total) by mouth 2 (two) times daily. 60 capsule 11  . KLOR-CON M20 20 MEQ tablet Take 1 tablet by mouth once daily (Patient taking differently: 40 mEq. ) 90 tablet 1  . leflunomide (ARAVA) 20 MG tablet Take 20 mg by mouth daily.    Marland Kitchen levothyroxine (SYNTHROID) 175 MCG tablet Take 1 tablet (175 mcg total) by mouth daily before breakfast. 90 tablet 1  . losartan (COZAAR) 50 MG tablet Take 1 tablet (50 mg total) by mouth daily. [place]  tablet 3  . neomycin-colistin-hydrocortisone-thonzonium (CORTISPORIN-TC) 3.07-19-08-0.5 MG/ML OTIC suspension Place 4 drops into the right ear 4 (four) times daily. 10 mL 0  . OLANZapine (ZYPREXA) 5 MG tablet Take 1 tablet by mouth nightly for 4 nights, starting on the day of chemotherapy each cycle.    Marland Kitchen omeprazole (PRILOSEC) 40 MG capsule Take 1 capsule (40 mg total) by mouth in the morning and at bedtime. 60 capsule 3  . ondansetron (ZOFRAN) 8 MG tablet Take by mouth.    . vitamin B-12 (CYANOCOBALAMIN) 1000 MCG tablet Take 1,000 mcg by mouth daily.    Marland Kitchen warfarin (COUMADIN) 5 MG tablet TAKE 1 & 1/2 (ONE & ONE-HALF) TABLETS BY MOUTH ONCE DAILY ON TUE, THURS, SAT, & SUN. THEN TAKE 2 TABLETS BY MOUTH ONCE DAILY ON OTHER DAYS 135 tablet 0   No current facility-administered medications on file prior to visit.   Allergies  Allergen Reactions  . Paclitaxel  Other (See Comments)    Flushing, Itching, Throat tightness   Social History   Socioeconomic History  . Marital status: Single    Spouse name: Not on file  . Number of children: Not on file  . Years of education: Not on file  . Highest education level: Not on file  Occupational History  . Not on file  Tobacco Use  . Smoking status: Former Smoker    Years: 32.00    Quit date: 06/19/2011    Years since quitting: 8.6  . Smokeless tobacco: Never Used  Vaping Use  . Vaping Use: Never used  Substance and Sexual Activity  . Alcohol use: No  . Drug use: No  . Sexual activity: Yes  Other Topics Concern  . Not on file  Social History Narrative  . Not on file   Social Determinants of Health   Financial Resource Strain:   . Difficulty of Paying Living Expenses: Not on file  Food Insecurity:   . Worried About Charity fundraiser in the Last Year: Not on file  . Ran Out of Food in the Last Year: Not on file  Transportation Needs:   . Lack of Transportation (Medical): Not on file  . Lack of Transportation (Non-Medical): Not on file  Physical Activity:   . Days of Exercise per Week: Not on file  . Minutes of Exercise per Session: Not on file  Stress:   . Feeling of Stress : Not on file  Social Connections:   . Frequency of Communication with Friends and Family: Not on file  . Frequency of Social Gatherings with Friends and Family: Not on file  . Attends Religious Services: Not on file  . Active Member of Clubs or Organizations: Not on file  . Attends Archivist Meetings: Not on file  . Marital Status: Not on file  Intimate Partner Violence:   . Fear of Current or Ex-Partner: Not on file  . Emotionally Abused: Not on file  . Physically Abused: Not on file  . Sexually Abused: Not on file     Review of Systems  All other systems reviewed and are negative.      Objective:   Physical Exam Vitals reviewed.  Constitutional:      General: She is not in acute  distress.    Appearance: Normal appearance. She is not ill-appearing or toxic-appearing.  Cardiovascular:     Rate and Rhythm: Normal rate and regular rhythm.     Pulses: Normal pulses.     Heart sounds: Normal  heart sounds. No murmur heard.  No friction rub. No gallop.   Pulmonary:     Effort: Pulmonary effort is normal. No respiratory distress.     Breath sounds: Normal breath sounds. No stridor. No wheezing, rhonchi or rales.  Skin:    Findings: No erythema.  Neurological:     Mental Status: She is alert.        Assessment & Plan:No diagnosis found.   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  Therefore I have asked her to take 10 mg today, 10 mg tomorrow, and 10 mg on Wednesday.  She will then resume her normal schedule as dictated in the history of present illness and I will recheck her INR in 10 days.

## 2020-03-06 ENCOUNTER — Other Ambulatory Visit: Payer: Self-pay | Admitting: Family Medicine

## 2020-03-08 ENCOUNTER — Ambulatory Visit: Payer: Managed Care, Other (non HMO) | Admitting: Family Medicine

## 2020-03-09 ENCOUNTER — Telehealth: Payer: Self-pay | Admitting: Family Medicine

## 2020-03-09 NOTE — Telephone Encounter (Signed)
CB# [phone] Having chemo treatment today they check her INR which was 1.6 would like to let Dr.Pickard aware

## 2020-03-12 NOTE — Telephone Encounter (Signed)
Take coumadin 10 mg everyday and recheck INR in 1 week.

## 2020-03-19 ENCOUNTER — Other Ambulatory Visit: Payer: Self-pay

## 2020-03-19 ENCOUNTER — Ambulatory Visit (INDEPENDENT_AMBULATORY_CARE_PROVIDER_SITE_OTHER): Payer: Managed Care, Other (non HMO) | Admitting: Family Medicine

## 2020-03-19 VITALS — BP 120/88 | HR 68 | Temp 97.9°F | Ht 67.0 in | Wt 237.0 lb

## 2020-03-19 DIAGNOSIS — I10 Essential (primary) hypertension: Secondary | ICD-10-CM | POA: Diagnosis not present

## 2020-03-19 DIAGNOSIS — E039 Hypothyroidism, unspecified: Secondary | ICD-10-CM

## 2020-03-19 DIAGNOSIS — E7800 Pure hypercholesterolemia, unspecified: Secondary | ICD-10-CM

## 2020-03-19 DIAGNOSIS — I743 Embolism and thrombosis of arteries of the lower extremities: Secondary | ICD-10-CM

## 2020-03-19 DIAGNOSIS — Z7901 Long term (current) use of anticoagulants: Secondary | ICD-10-CM

## 2020-03-19 DIAGNOSIS — D508 Other iron deficiency anemias: Secondary | ICD-10-CM

## 2020-03-19 LAB — PT WITH INR/FINGERSTICK
INR, fingerstick: 1.8 ratio — ABNORMAL HIGH
PT, fingerstick: 22.1 s — ABNORMAL HIGH (ref 10.5–13.1)

## 2020-03-19 NOTE — Progress Notes (Signed)
Subjective:    Patient ID: Donna [name], female    DOB: April 02, 1961, 59 y.o.   MRN: [id number]  HPI We recently increase the patient's Coumadin to 10 mg a day when her INR was 1.6.  This was approximately 1 week ago.  Therefore she has been on the higher dose for about 6 or 7 days.  Today she is in to recheck her INR is 1.8.  She denies any bleeding or bruising however she is scheduled for her next round of chemo next week. Past Medical History:  Diagnosis Date  . Anxiety   . Arterial thrombosis (HCC)    right sfa, popliteal artery.  Require thromectomy at [facility] and lifelong anticoagulation.  . Cancer ([place])    ovarian, recurrent on chemo (carboplatin, doxorubicin) at [facility]  . Colon polyp   . Depression   . Full dentures   . GERD (gastroesophageal reflux disease)   . HLD (hyperlipidemia) 12/03/2012  . Hypertension   . Hypothyroidism   . IBS (irritable bowel syndrome)   . Neck mass   . Neuropathy    feet  . Rheumatoid arthritis ([place])    at [facility]  . Schatzki's ring   . Wears glasses    Past Surgical History:  Procedure Laterality Date  . ABDOMINAL HYSTERECTOMY    . ABLATION    . COLONOSCOPY    . CYST REMOVAL NECK  05/13/12  . EAR CYST EXCISION  05/13/2012   Procedure: CYST REMOVAL;  Surgeon: Ralene Ok, MD;  Location: [place];  Service: General;  Laterality: Left;  excision of neck cyst  . THYROID SURGERY  10/2011 - approximate   ablation  . TONSILLECTOMY    . UPPER GI ENDOSCOPY     Current Outpatient Medications on File Prior to Visit  Medication Sig Dispense Refill  . amLODipine (NORVASC) 10 MG tablet Take 1 tablet by mouth once daily 90 tablet 0  . atorvastatin (LIPITOR) 40 MG tablet Take 2 tablets by mouth once daily 180 tablet 0  . Cholecalciferol (VITAMIN D3) 50 MCG (2000 UT) capsule     . dexamethasone (DECADRON) 4 MG tablet Take 2 tablets (8 mg) by mouth daily with breakfast for 3 days after chemotherapy each cycle.  Do not take prednisone on the days you take  dexamethasone.    . diphenhydrAMINE (BENADRYL) 25 MG tablet Take 25 mg by mouth at bedtime as needed. For sleep    . escitalopram (LEXAPRO) 10 MG tablet Take 1 tablet (10 mg total) by mouth daily. 30 tablet 5  . gabapentin (NEURONTIN) 300 MG capsule Take 1 capsule (300 mg total) by mouth 2 (two) times daily. 60 capsule 11  . hydrochlorothiazide (MICROZIDE) 12.5 MG capsule Take 1 capsule by mouth once daily 90 capsule 0  . KLOR-CON M20 20 MEQ tablet Take 1 tablet by mouth once daily (Patient taking differently: 40 mEq. ) 90 tablet 1  . leflunomide (ARAVA) 20 MG tablet Take 20 mg by mouth daily.    Marland Kitchen levothyroxine (SYNTHROID) 175 MCG tablet Take 1 tablet (175 mcg total) by mouth daily before breakfast. 90 tablet 1  . losartan (COZAAR) 50 MG tablet Take 1 tablet (50 mg total) by mouth daily. 90 tablet 3  . neomycin-colistin-hydrocortisone-thonzonium (CORTISPORIN-TC) 3.07-19-08-0.5 MG/ML OTIC suspension Place 4 drops into the right ear 4 (four) times daily. 10 mL 0  . OLANZapine (ZYPREXA) 5 MG tablet Take 1 tablet by mouth nightly for 4 nights, starting on the day of chemotherapy each cycle.    Marland Kitchen  omeprazole (PRILOSEC) 40 MG capsule Take 1 capsule (40 mg total) by mouth in the morning and at bedtime. 60 capsule 3  . ondansetron (ZOFRAN) 8 MG tablet Take by mouth.    . vitamin B-12 (CYANOCOBALAMIN) 1000 MCG tablet Take 1,000 mcg by mouth daily.    Marland Kitchen warfarin (COUMADIN) 5 MG tablet TAKE 1 & 1/2 (ONE & ONE-HALF) TABLETS BY MOUTH ONCE DAILY ON TUE, THURS, SAT, & SUN. THEN TAKE 2 TABLETS BY MOUTH ONCE DAILY ON OTHER DAYS 135 tablet 0   No current facility-administered medications on file prior to visit.   Allergies  Allergen Reactions  . Paclitaxel Other (See Comments)    Flushing, Itching, Throat tightness   Social History   Socioeconomic History  . Marital status: Single    Spouse name: Not on file  . Number of children: Not on file  . Years of education: Not on file  . Highest education level:  Not on file  Occupational History  . Not on file  Tobacco Use  . Smoking status: Former Smoker    Years: 32.00    Quit date: 06/19/2011    Years since quitting: 8.7  . Smokeless tobacco: Never Used  Vaping Use  . Vaping Use: Never used  Substance and Sexual Activity  . Alcohol use: No  . Drug use: No  . Sexual activity: Yes  Other Topics Concern  . Not on file  Social History Narrative  . Not on file   Social Determinants of Health   Financial Resource Strain:   . Difficulty of Paying Living Expenses: Not on file  Food Insecurity:   . Worried About Charity fundraiser in the Last Year: Not on file  . Ran Out of Food in the Last Year: Not on file  Transportation Needs:   . Lack of Transportation (Medical): Not on file  . Lack of Transportation (Non-Medical): Not on file  Physical Activity:   . Days of Exercise per Week: Not on file  . Minutes of Exercise per Session: Not on file  Stress:   . Feeling of Stress : Not on file  Social Connections:   . Frequency of Communication with Friends and Family: Not on file  . Frequency of Social Gatherings with Friends and Family: Not on file  . Attends Religious Services: Not on file  . Active Member of Clubs or Organizations: Not on file  . Attends Archivist Meetings: Not on file  . Marital Status: Not on file  Intimate Partner Violence:   . Fear of Current or Ex-Partner: Not on file  . Emotionally Abused: Not on file  . Physically Abused: Not on file  . Sexually Abused: Not on file      Review of Systems  All other systems reviewed and are negative.      Objective:   Physical Exam Vitals reviewed.  Constitutional:      Appearance: She is normal weight.  Cardiovascular:     Rate and Rhythm: Normal rate and regular rhythm.     Heart sounds: Normal heart sounds.  Pulmonary:     Effort: Pulmonary effort is normal.     Breath sounds: Normal breath sounds.  Musculoskeletal:     Right lower leg: No edema.      Left lower leg: No edema.  Neurological:     Mental Status: She is alert.           Assessment & Plan:  Arterial embolism and thrombosis of  lower extremity (HCC)  INR is subtherapeutic.  Increase Coumadin to 12.5 mg on Monday Wednesday and Friday and 10 mg on all other days and recheck INR in 10 days.  Patient mentions that she may be having an EGD performed in early December.  I asked her to discuss this with her gastroenterologist but I recommended stopping Coumadin 5 days prior to procedure and every day that she is not on Coumadin taking Lovenox until 24 hours before the procedure and then holding Lovenox the morning of the procedure.  I asked her to verify this with her gastroenterologist.  She will let me know what they decide and then we will dose the Lovenox accordingly.

## 2020-03-19 NOTE — Addendum Note (Signed)
Addended by: Saundra Shelling on: 03/19/2020 12:07 PM   Modules accepted: Orders

## 2020-03-19 NOTE — Addendum Note (Signed)
Addended by: Saundra Shelling on: 03/19/2020 12:09 PM   Modules accepted: Orders

## 2020-03-30 ENCOUNTER — Encounter: Payer: Self-pay | Admitting: Family Medicine

## 2020-04-05 ENCOUNTER — Ambulatory Visit (INDEPENDENT_AMBULATORY_CARE_PROVIDER_SITE_OTHER): Payer: Managed Care, Other (non HMO) | Admitting: Podiatry

## 2020-04-05 ENCOUNTER — Encounter: Payer: Self-pay | Admitting: Podiatry

## 2020-04-05 ENCOUNTER — Other Ambulatory Visit: Payer: Self-pay

## 2020-04-05 DIAGNOSIS — M779 Enthesopathy, unspecified: Secondary | ICD-10-CM

## 2020-04-06 NOTE — Progress Notes (Signed)
Subjective:   Patient ID: Donna [name], female   DOB: 59 y.o.   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

## 2020-04-14 ENCOUNTER — Other Ambulatory Visit: Payer: Self-pay | Admitting: Family Medicine

## 2020-04-16 ENCOUNTER — Other Ambulatory Visit: Payer: Self-pay | Admitting: Family Medicine

## 2020-04-16 ENCOUNTER — Encounter: Payer: Self-pay | Admitting: Family Medicine

## 2020-04-16 MED ORDER — GABAPENTIN 300 MG PO CAPS
300.0000 mg | ORAL_CAPSULE | Freq: Two times a day (BID) | ORAL | 11 refills | Status: DC
Start: 2020-04-16 — End: 2020-04-17

## 2020-04-17 ENCOUNTER — Other Ambulatory Visit: Payer: Self-pay

## 2020-04-17 MED ORDER — GABAPENTIN 300 MG PO CAPS
300.0000 mg | ORAL_CAPSULE | Freq: Two times a day (BID) | ORAL | 11 refills | Status: DC
Start: 2020-04-17 — End: 2020-04-18

## 2020-04-18 ENCOUNTER — Ambulatory Visit: Payer: Managed Care, Other (non HMO) | Admitting: Podiatry

## 2020-04-18 ENCOUNTER — Other Ambulatory Visit: Payer: Self-pay

## 2020-04-18 DIAGNOSIS — B351 Tinea unguium: Secondary | ICD-10-CM | POA: Diagnosis not present

## 2020-04-18 DIAGNOSIS — M79675 Pain in left toe(s): Secondary | ICD-10-CM

## 2020-04-18 DIAGNOSIS — M79674 Pain in right toe(s): Secondary | ICD-10-CM

## 2020-04-18 DIAGNOSIS — L853 Xerosis cutis: Secondary | ICD-10-CM

## 2020-04-18 MED ORDER — GABAPENTIN 300 MG PO CAPS
300.0000 mg | ORAL_CAPSULE | Freq: Two times a day (BID) | ORAL | 11 refills | Status: DC
Start: 2020-04-18 — End: 2020-08-24

## 2020-04-19 ENCOUNTER — Encounter: Payer: Self-pay | Admitting: Podiatry

## 2020-04-19 NOTE — Progress Notes (Signed)
  Subjective:  Patient ID: Donna [name], female    DOB: February 14, 1961,  MRN: [id number]  Chief Complaint  Patient presents with  . routine foot care    nail trim    59 y.o. female returns for the above complaint.  Patient presents with thickened elongated dystrophic toenails x10.  Patient would like to have them debrided down.  She is not able to do it herself.  She had received an injection from Dr. Paulla Dolly for sinus tarsi syndrome which seems to be doing a lot better.  She would like to hold off on another steroid injection.  She also has secondary complaint of dry skin to bilateral lower extremity.  She would like to discuss the best over-the-counter treatment options for it.  Objective:  There were no vitals filed for this visit. Podiatric Exam: Vascular: dorsalis pedis and posterior tibial pulses are palpable bilateral. Capillary return is immediate. Temperature gradient is WNL. Skin turgor WNL  Sensorium: Normal Semmes Weinstein monofilament test. Normal tactile sensation bilaterally. Nail Exam: Pt has thick disfigured discolored nails with subungual debris noted bilateral entire nail hallux through fifth toenails.  Pain on palpation to the nails. Ulcer Exam: There is no evidence of ulcer or pre-ulcerative changes or infection. Orthopedic Exam: Muscle tone and strength are WNL. No limitations in general ROM. No crepitus or effusions noted. HAV  B/L.  Hammer toes 2-5  B/L. Skin: No Porokeratosis. No infection or ulcers.  Moderate severe dry skin noted to bilateral lower extremity    Assessment & Plan:   1. Xerosis cutis   2. Pain due to onychomycosis of toenails of both feet     Patient was evaluated and treated and all questions answered.  Xerosis bilateral lower extremity -I explained to the patient the etiology of xerosis and various treatment options were extensively discussed.  I explained to the patient the importance of maintaining moisturization of the skin with application of  over-the-counter lotion such as Eucerin or Luciderm.  I have asked the patient to apply this twice a day.  If unable to resolve patient will benefit from prescription lotion.   Onychomycosis with pain  -Nails palliatively debrided as below. -Educated on self-care  Procedure: Nail Debridement Rationale: pain  Type of Debridement: manual, sharp debridement. Instrumentation: Nail nipper, rotary burr. Number of Nails: 10  Procedures and Treatment: Consent by patient was obtained for treatment procedures. The patient understood the discussion of treatment and procedures well. All questions were answered thoroughly reviewed. Debridement of mycotic and hypertrophic toenails, 1 through 5 bilateral and clearing of subungual debris. No ulceration, no infection noted.  Return Visit-Office Procedure: Patient instructed to return to the office for a follow up visit 3 months for continued evaluation and treatment.  Boneta Lucks, DPM    No follow-ups on file.

## 2020-04-20 ENCOUNTER — Telehealth: Payer: Self-pay | Admitting: Family Medicine

## 2020-04-20 ENCOUNTER — Encounter: Payer: Self-pay | Admitting: Family Medicine

## 2020-04-20 NOTE — Telephone Encounter (Signed)
Pt would like for her INR to be check also would like to let Dr.Pickard know she will double up on Warfarin

## 2020-04-24 NOTE — Telephone Encounter (Signed)
Will call patient to come in and have INR checked. Patient has started 10 mg daily of Warfarin.

## 2020-05-01 ENCOUNTER — Other Ambulatory Visit: Payer: Self-pay

## 2020-05-01 ENCOUNTER — Ambulatory Visit (INDEPENDENT_AMBULATORY_CARE_PROVIDER_SITE_OTHER): Payer: Managed Care, Other (non HMO) | Admitting: Family Medicine

## 2020-05-01 VITALS — BP 110/80 | HR 67 | Temp 98.4°F | Ht 67.0 in | Wt 240.0 lb

## 2020-05-01 DIAGNOSIS — M059 Rheumatoid arthritis with rheumatoid factor, unspecified: Secondary | ICD-10-CM

## 2020-05-01 DIAGNOSIS — Z7901 Long term (current) use of anticoagulants: Secondary | ICD-10-CM

## 2020-05-01 DIAGNOSIS — I749 Embolism and thrombosis of unspecified artery: Secondary | ICD-10-CM

## 2020-05-01 DIAGNOSIS — C569 Malignant neoplasm of unspecified ovary: Secondary | ICD-10-CM | POA: Diagnosis not present

## 2020-05-01 LAB — PT WITH INR/FINGERSTICK
INR, fingerstick: 1.5 ratio — ABNORMAL HIGH
PT, fingerstick: 17.5 s — ABNORMAL HIGH (ref 10.5–13.1)

## 2020-05-01 NOTE — Progress Notes (Signed)
Subjective:    Patient ID: Donna [name], female    DOB: Feb 20, 1961, 59 y.o.   MRN: [id number]  HPI Patient is a very sweet 59 year old Caucasian female who comes in today to recheck her INR.  Chemotherapy 10 days ago, the patient's INR was 1.3.  This was taking Coumadin 10 mg a day 3 days a week and 7.5 mg 4 days a week.  We increased her Coumadin to 10 mg a day total.  She is been taking that for 7 days and her INR has increased to 1.5.  She is still very subtherapeutic.  She is also having increased neuropathic pain in both feet from her knees to her toes as well as in both hands which I believe is most likely due to the chemotherapy.  She is not taking B12 right now but she is planning on picking some up.  Her rheumatologist is requesting that we check a sed rate and a CRP to see if the rheumatoid arthritis could be playing a role.  I anticipate that the sed rate and CRP would likely be elevated due to her cancer treatments however I will be glad to check that.  She is also had low potassium with potassium of 3.0 and a hemoglobin dropped to 9 on her most recent chemotherapy profile.  She is still taking hydrochlorothiazide for hypertension however her blood pressure today is low.  Given the fact her blood pressure is low and her potassium is low and she is extremely tired, I do not think that the hydrochlorothiazide is beneficial to this patient. Past Medical History:  Diagnosis Date  . Anxiety   . Arterial thrombosis (HCC)    right sfa, popliteal artery.  Require thromectomy at [facility] and lifelong anticoagulation.  . Cancer ([place])    ovarian, recurrent on chemo (carboplatin, doxorubicin) at [facility]  . Colon polyp   . Depression   . Full dentures   . GERD (gastroesophageal reflux disease)   . HLD (hyperlipidemia) 12/03/2012  . Hypertension   . Hypothyroidism   . IBS (irritable bowel syndrome)   . Neck mass   . Neuropathy    feet  . Rheumatoid arthritis ([place])    at [facility]  . Schatzki's ring    . Wears glasses    Past Surgical History:  Procedure Laterality Date  . ABDOMINAL HYSTERECTOMY    . ABLATION    . COLONOSCOPY    . CYST REMOVAL NECK  05/13/12  . EAR CYST EXCISION  05/13/2012   Procedure: CYST REMOVAL;  Surgeon: Ralene Ok, MD;  Location: [place];  Service: General;  Laterality: Left;  excision of neck cyst  . THYROID SURGERY  10/2011 - approximate   ablation  . TONSILLECTOMY    . UPPER GI ENDOSCOPY     Current Outpatient Medications on File Prior to Visit  Medication Sig Dispense Refill  . amLODipine (NORVASC) 10 MG tablet Take 1 tablet by mouth once daily 90 tablet 0  . atorvastatin (LIPITOR) 40 MG tablet Take 2 tablets by mouth once daily 180 tablet 0  . Cholecalciferol (VITAMIN D3) 50 MCG (2000 UT) capsule     . dexamethasone (DECADRON) 4 MG tablet Take 2 tablets (8 mg) by mouth daily with breakfast for 3 days after chemotherapy each cycle.  Do not take prednisone on the days you take dexamethasone.    . diphenhydrAMINE (BENADRYL) 25 MG tablet Take 25 mg by mouth at bedtime as needed. For sleep    . escitalopram (  LEXAPRO) 10 MG tablet Take 1 tablet (10 mg total) by mouth daily. 30 tablet 5  . gabapentin (NEURONTIN) 300 MG capsule Take 1 capsule (300 mg total) by mouth 2 (two) times daily. 60 capsule 11  . hydrochlorothiazide (MICROZIDE) 12.5 MG capsule Take 1 capsule by mouth once daily 90 capsule 0  . KLOR-CON M20 20 MEQ tablet Take 1 tablet by mouth once daily (Patient taking differently: 40 mEq.) 90 tablet 1  . leflunomide (ARAVA) 20 MG tablet Take 20 mg by mouth daily.    Marland Kitchen levothyroxine (SYNTHROID) 175 MCG tablet Take 1 tablet (175 mcg total) by mouth daily before breakfast. 90 tablet 1  . losartan (COZAAR) 50 MG tablet Take 1 tablet (50 mg total) by mouth daily. 90 tablet 3  . neomycin-colistin-hydrocortisone-thonzonium (CORTISPORIN-TC) 3.07-19-08-0.5 MG/ML OTIC suspension Place 4 drops into the right ear 4 (four) times daily. 10 mL 0  . nitrofurantoin,  macrocrystal-monohydrate, (MACROBID) 100 MG capsule Take 100 mg by mouth 2 (two) times daily.    Marland Kitchen OLANZapine (ZYPREXA) 5 MG tablet Take 1 tablet by mouth nightly for 4 nights, starting on the day of chemotherapy each cycle.    Marland Kitchen omeprazole (PRILOSEC) 40 MG capsule Take 1 capsule (40 mg total) by mouth in the morning and at bedtime. 60 capsule 3  . ondansetron (ZOFRAN) 8 MG tablet Take by mouth.    . prochlorperazine (COMPAZINE) 10 MG tablet Take 1 tablet (10 mg) by mouth every six hours as needed for nausea if not controlled with other nausea medicines.    . vitamin B-12 (CYANOCOBALAMIN) 1000 MCG tablet Take 1,000 mcg by mouth daily.    Marland Kitchen warfarin (COUMADIN) 5 MG tablet TAKE 1 & 1/2 (ONE & ONE-HALF) TABLETS BY MOUTH ONCE DAILY ON TUE, THURS, SAT, & SUN. THEN TAKE 2 TABLETS BY MOUTH ONCE DAILY ON OTHER DAYS 135 tablet 2   No current facility-administered medications on file prior to visit.   Allergies  Allergen Reactions  . Paclitaxel Other (See Comments)    Flushing, Itching, Throat tightness   Social History   Socioeconomic History  . Marital status: Single    Spouse name: Not on file  . Number of children: Not on file  . Years of education: Not on file  . Highest education level: Not on file  Occupational History  . Not on file  Tobacco Use  . Smoking status: Former Smoker    Years: 32.00    Quit date: 06/19/2011    Years since quitting: 8.8  . Smokeless tobacco: Never Used  Vaping Use  . Vaping Use: Never used  Substance and Sexual Activity  . Alcohol use: No  . Drug use: No  . Sexual activity: Yes  Other Topics Concern  . Not on file  Social History Narrative  . Not on file   Social Determinants of Health   Financial Resource Strain: Not on file  Food Insecurity: Not on file  Transportation Needs: Not on file  Physical Activity: Not on file  Stress: Not on file  Social Connections: Not on file  Intimate Partner Violence: Not on file      Review of Systems   All other systems reviewed and are negative.      Objective:   Physical Exam Vitals reviewed.  Constitutional:      Appearance: She is normal weight.  Cardiovascular:     Rate and Rhythm: Normal rate and regular rhythm.     Heart sounds: Normal heart sounds.  Pulmonary:  Effort: Pulmonary effort is normal.     Breath sounds: Normal breath sounds.  Musculoskeletal:     Right lower leg: No edema.     Left lower leg: No edema.  Neurological:     Mental Status: She is alert.           Assessment & Plan:  On warfarin therapy - Plan: PT with INR/Fingerstick  Arterial thrombosis ([place]) - Plan: PT with INR/Fingerstick  Rheumatoid arthritis with positive rheumatoid factor, involving unspecified site [facility]) - Plan: Sedimentation rate, C-reactive protein, CBC with Differential/Platelet, BASIC METABOLIC PANEL WITH GFR  Primary malignant neoplasm of ovary, unspecified laterality ([place])  We will start the patient on 15 mg of Coumadin Monday, Wednesday, Friday, Saturday.  She will take 10 mg of Coumadin on Tuesday, Thursday, Sunday.  Recheck INR in 1 week.  Check CBC and CMP to monitor her electrolytes.  Check sed rate and CRP per the request of the rheumatologist.  Discontinue hydrochlorothiazide and increase gabapentin to 300 mg p.o. 3 times daily for neuropathy that I suspect is due to her chemotherapy treatment

## 2020-05-02 ENCOUNTER — Encounter: Payer: Self-pay | Admitting: Family Medicine

## 2020-05-02 LAB — CBC WITH DIFFERENTIAL/PLATELET
Absolute Monocytes: 495 cells/uL (ref 200–950)
Basophils Absolute: 9 cells/uL (ref 0–200)
Basophils Relative: 0.2 %
Eosinophils Absolute: 9 cells/uL — ABNORMAL LOW (ref 15–500)
Eosinophils Relative: 0.2 %
HCT: 23.2 % — ABNORMAL LOW (ref 35.0–45.0)
Hemoglobin: 7.9 g/dL — ABNORMAL LOW (ref 11.7–15.5)
Lymphs Abs: 1737 cells/uL (ref 850–3900)
MCH: 31.1 pg (ref 27.0–33.0)
MCHC: 34.1 g/dL (ref 32.0–36.0)
MCV: 91.3 fL (ref 80.0–100.0)
MPV: 10.3 fL (ref 7.5–12.5)
Monocytes Relative: 11.5 %
Neutro Abs: 2051 cells/uL (ref 1500–7800)
Neutrophils Relative %: 47.7 %
Platelets: 118 10*3/uL — ABNORMAL LOW (ref 140–400)
RBC: 2.54 10*6/uL — ABNORMAL LOW (ref 3.80–5.10)
RDW: 22.1 % — ABNORMAL HIGH (ref 11.0–15.0)
Total Lymphocyte: 40.4 %
WBC: 4.3 10*3/uL (ref 3.8–10.8)

## 2020-05-02 LAB — BASIC METABOLIC PANEL WITH GFR
BUN: 7 mg/dL (ref 7–25)
CO2: 27 mmol/L (ref 20–32)
Calcium: 7.6 mg/dL — ABNORMAL LOW (ref 8.6–10.4)
Chloride: 100 mmol/L (ref 98–110)
Creat: 0.63 mg/dL (ref 0.50–0.99)
GFR, Est African American: 113 mL/min/{1.73_m2} (ref >=60)
GFR, Est Non African American: 97 mL/min/{1.73_m2} (ref >=60)
Glucose, Bld: 84 mg/dL (ref 65–99)
Potassium: 3.2 mmol/L — ABNORMAL LOW (ref 3.5–5.3)
Sodium: 140 mmol/L (ref 135–146)

## 2020-05-02 LAB — C-REACTIVE PROTEIN: CRP: 9.4 mg/L — ABNORMAL HIGH (ref <8.0)

## 2020-05-02 LAB — SEDIMENTATION RATE: Sed Rate: 45 mm/h — ABNORMAL HIGH (ref 0–30)

## 2020-05-09 ENCOUNTER — Ambulatory Visit: Payer: Managed Care, Other (non HMO) | Admitting: Podiatry

## 2020-05-09 ENCOUNTER — Ambulatory Visit (INDEPENDENT_AMBULATORY_CARE_PROVIDER_SITE_OTHER): Payer: Managed Care, Other (non HMO)

## 2020-05-09 ENCOUNTER — Other Ambulatory Visit: Payer: Self-pay

## 2020-05-09 DIAGNOSIS — M7751 Other enthesopathy of right foot: Secondary | ICD-10-CM

## 2020-05-09 DIAGNOSIS — S93401A Sprain of unspecified ligament of right ankle, initial encounter: Secondary | ICD-10-CM

## 2020-05-09 DIAGNOSIS — M79671 Pain in right foot: Secondary | ICD-10-CM | POA: Diagnosis not present

## 2020-05-09 DIAGNOSIS — M779 Enthesopathy, unspecified: Secondary | ICD-10-CM

## 2020-05-10 ENCOUNTER — Ambulatory Visit: Payer: Self-pay | Admitting: Family Medicine

## 2020-05-10 ENCOUNTER — Encounter: Payer: Self-pay | Admitting: Podiatry

## 2020-05-10 NOTE — Progress Notes (Signed)
Subjective:  Patient ID: Donna [name], female    DOB: 02-06-1961,  MRN: [id number]  Chief Complaint  Patient presents with  . Foot Pain    Right foot pain     59 y.o. female presents with the above complaint.  Patient presents with complaint of right ankle pain lateral side.  Patient stating of the last few days just came out of nowhere.  Patient has a history of rheumatoid arthritis.  She feels like there might be a flareup of her rheumatoid arthritis.  She is being managed with medication.  Pain is constant and there is some mild compensatory pain to the heel.  She has not tried anything else.  She denies any other acute complaints.  She has not seen anyone else prior to seeing me   Review of Systems: Negative except as noted in the HPI. Denies N/V/F/Ch.  Past Medical History:  Diagnosis Date  . Anxiety   . Arterial thrombosis (HCC)    right sfa, popliteal artery.  Require thromectomy at [facility] and lifelong anticoagulation.  . Cancer ([place])    ovarian, recurrent on chemo (carboplatin, doxorubicin) at [facility]  . Colon polyp   . Depression   . Full dentures   . GERD (gastroesophageal reflux disease)   . HLD (hyperlipidemia) 12/03/2012  . Hypertension   . Hypothyroidism   . IBS (irritable bowel syndrome)   . Neck mass   . Neuropathy    feet  . Rheumatoid arthritis ([place])    at [facility]  . Schatzki's ring   . Wears glasses     Current Outpatient Medications:  .  amLODipine (NORVASC) 10 MG tablet, Take 1 tablet by mouth once daily, Disp: 90 tablet, Rfl: 0 .  atorvastatin (LIPITOR) 40 MG tablet, Take 2 tablets by mouth once daily, Disp: 180 tablet, Rfl: 0 .  Cholecalciferol (VITAMIN D3) 50 MCG (2000 UT) capsule, , Disp: , Rfl:  .  dexamethasone (DECADRON) 4 MG tablet, Take 2 tablets (8 mg) by mouth daily with breakfast for 3 days after chemotherapy each cycle.  Do not take prednisone on the days you take dexamethasone., Disp: , Rfl:  .  diphenhydrAMINE (BENADRYL) 25 MG tablet, Take 25 mg  by mouth at bedtime as needed. For sleep, Disp: , Rfl:  .  escitalopram (LEXAPRO) 10 MG tablet, Take 1 tablet (10 mg total) by mouth daily., Disp: 30 tablet, Rfl: 5 .  gabapentin (NEURONTIN) 300 MG capsule, Take 1 capsule (300 mg total) by mouth 2 (two) times daily., Disp: 60 capsule, Rfl: 11 .  hydrochlorothiazide (MICROZIDE) 12.5 MG capsule, Take 1 capsule by mouth once daily, Disp: 90 capsule, Rfl: 0 .  KLOR-CON M20 20 MEQ tablet, Take 1 tablet by mouth once daily (Patient taking differently: 40 mEq.), Disp: 90 tablet, Rfl: 1 .  leflunomide (ARAVA) 20 MG tablet, Take 20 mg by mouth daily., Disp: , Rfl:  .  levothyroxine (SYNTHROID) 175 MCG tablet, Take 1 tablet (175 mcg total) by mouth daily before breakfast., Disp: 90 tablet, Rfl: 1 .  losartan (COZAAR) 50 MG tablet, Take 1 tablet (50 mg total) by mouth daily., Disp: 90 tablet, Rfl: 3 .  neomycin-colistin-hydrocortisone-thonzonium (CORTISPORIN-TC) 3.07-19-08-0.5 MG/ML OTIC suspension, Place 4 drops into the right ear 4 (four) times daily., Disp: 10 mL, Rfl: 0 .  nitrofurantoin, macrocrystal-monohydrate, (MACROBID) 100 MG capsule, Take 100 mg by mouth 2 (two) times daily., Disp: , Rfl:  .  OLANZapine (ZYPREXA) 5 MG tablet, Take 1 tablet by mouth nightly for 4  nights, starting on the day of chemotherapy each cycle., Disp: , Rfl:  .  omeprazole (PRILOSEC) 40 MG capsule, Take 1 capsule (40 mg total) by mouth in the morning and at bedtime., Disp: 60 capsule, Rfl: 3 .  ondansetron (ZOFRAN) 8 MG tablet, Take by mouth., Disp: , Rfl:  .  prochlorperazine (COMPAZINE) 10 MG tablet, Take 1 tablet (10 mg) by mouth every six hours as needed for nausea if not controlled with other nausea medicines., Disp: , Rfl:  .  vitamin B-12 (CYANOCOBALAMIN) 1000 MCG tablet, Take 1,000 mcg by mouth daily., Disp: , Rfl:  .  warfarin (COUMADIN) 5 MG tablet, TAKE 1 & 1/2 (ONE & ONE-HALF) TABLETS BY MOUTH ONCE DAILY ON TUE, THURS, SAT, & SUN. THEN TAKE 2 TABLETS BY MOUTH ONCE  DAILY ON OTHER DAYS, Disp: 135 tablet, Rfl: 2  Social History   Tobacco Use  Smoking Status Former Smoker  . Years: 32.00  . Quit date: 06/19/2011  . Years since quitting: 8.8  Smokeless Tobacco Never Used    Allergies  Allergen Reactions  . Paclitaxel Other (See Comments)    Flushing, Itching, Throat tightness   Objective:  There were no vitals filed for this visit. There is no height or weight on file to calculate BMI. Constitutional Well developed. Well nourished.  Vascular Dorsalis pedis pulses palpable bilaterally. Posterior tibial pulses palpable bilaterally. Capillary refill normal to all digits.  No cyanosis or clubbing noted. Pedal hair growth normal.  Neurologic Normal speech. Oriented to person, place, and time. Epicritic sensation to light touch grossly present bilaterally.  Dermatologic Nails well groomed and normal in appearance. No open wounds. No skin lesions.  Orthopedic:  Pain on palpation right lateral ankle gutter.  Pain with range of motion of the ankle joint.  No deep intra-articular pain noted.  Pain with plantarflexion inversion of the foot as well.  Pain at the ATFL ligament as well.  No pain at the sinus tarsi.   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  Midfoot arthritis noted.  No other bony abnormalities identified.  Plantar and posterior spurring noted.  Assessment:   1. Foot pain, right   2. Capsulitis of ankle, right   3. Mild ankle sprain, right, initial encounter    Plan:  Patient was evaluated and treated and all questions answered.  Right ankle capsulitis versus ankle sprain -I explained the patient the etiology of capsulitis and various treatment options were discussed.  Given that patient has pain with range of motion unlikely to believe that this is lateral ankle gutter capsulitis.  I believe patient will benefit from a steroid injection as well as cam boot  immobilization in case of there is an ankle sprain.  She does not have any history of twisting the ankle.  At this time patient agrees with the plan like to proceed with a steroid injection as well as cam boot immobilization as well. -A steroid injection was performed at right ankle joint using 1% plain Lidocaine and 10Kenalog mg of Kenalog. This was well tolerated. -Cam boot was dispensed  No follow-ups on file.

## 2020-05-14 ENCOUNTER — Other Ambulatory Visit: Payer: Self-pay | Admitting: Podiatry

## 2020-05-14 DIAGNOSIS — M779 Enthesopathy, unspecified: Secondary | ICD-10-CM

## 2020-05-15 ENCOUNTER — Other Ambulatory Visit: Payer: Self-pay

## 2020-05-15 ENCOUNTER — Ambulatory Visit (INDEPENDENT_AMBULATORY_CARE_PROVIDER_SITE_OTHER): Payer: Managed Care, Other (non HMO) | Admitting: Family Medicine

## 2020-05-15 VITALS — BP 110/70 | HR 78 | Temp 98.2°F | Ht 67.0 in | Wt 245.0 lb

## 2020-05-15 DIAGNOSIS — Z7901 Long term (current) use of anticoagulants: Secondary | ICD-10-CM | POA: Diagnosis not present

## 2020-05-15 DIAGNOSIS — I749 Embolism and thrombosis of unspecified artery: Secondary | ICD-10-CM | POA: Diagnosis not present

## 2020-05-15 LAB — CBC WITH DIFFERENTIAL/PLATELET
Absolute Monocytes: 480 cells/uL (ref 200–950)
Basophils Absolute: 20 cells/uL (ref 0–200)
Basophils Relative: 0.4 %
Eosinophils Absolute: 20 cells/uL (ref 15–500)
Eosinophils Relative: 0.4 %
HCT: 28.2 % — ABNORMAL LOW (ref 35.0–45.0)
Hemoglobin: 9.5 g/dL — ABNORMAL LOW (ref 11.7–15.5)
Lymphs Abs: 1455 cells/uL (ref 850–3900)
MCH: 32.8 pg (ref 27.0–33.0)
MCHC: 33.7 g/dL (ref 32.0–36.0)
MCV: 97.2 fL (ref 80.0–100.0)
MPV: 9.2 fL (ref 7.5–12.5)
Monocytes Relative: 9.8 %
Neutro Abs: 2925 cells/uL (ref 1500–7800)
Neutrophils Relative %: 59.7 %
Platelets: 174 10*3/uL (ref 140–400)
RBC: 2.9 10*6/uL — ABNORMAL LOW (ref 3.80–5.10)
RDW: 22 % — ABNORMAL HIGH (ref 11.0–15.0)
Total Lymphocyte: 29.7 %
WBC: 4.9 10*3/uL (ref 3.8–10.8)

## 2020-05-15 LAB — COMPLETE METABOLIC PANEL WITH GFR
AG Ratio: 2.3 (calc) (ref 1.0–2.5)
ALT: 11 U/L (ref 6–29)
AST: 17 U/L (ref 10–35)
Albumin: 4.2 g/dL (ref 3.6–5.1)
Alkaline phosphatase (APISO): 85 U/L (ref 37–153)
BUN: 7 mg/dL (ref 7–25)
CO2: 28 mmol/L (ref 20–32)
Calcium: 8.4 mg/dL — ABNORMAL LOW (ref 8.6–10.4)
Chloride: 106 mmol/L (ref 98–110)
Creat: 0.66 mg/dL (ref 0.50–0.99)
GFR, Est African American: 111 mL/min/{1.73_m2} (ref >=60)
GFR, Est Non African American: 96 mL/min/{1.73_m2} (ref >=60)
Globulin: 1.8 g/dL (calc) — ABNORMAL LOW (ref 1.9–3.7)
Glucose, Bld: 87 mg/dL (ref 65–99)
Potassium: 3.7 mmol/L (ref 3.5–5.3)
Sodium: 142 mmol/L (ref 135–146)
Total Bilirubin: 0.3 mg/dL (ref 0.2–1.2)
Total Protein: 6 g/dL — ABNORMAL LOW (ref 6.1–8.1)

## 2020-05-15 LAB — PT WITH INR/FINGERSTICK
INR, fingerstick: 3.3 ratio — ABNORMAL HIGH
PT, fingerstick: 39.7 s — ABNORMAL HIGH (ref 10.5–13.1)

## 2020-05-15 LAB — CBC MORPHOLOGY

## 2020-05-15 MED ORDER — FUROSEMIDE 20 MG PO TABS: 20.0000 mg | ORAL_TABLET | Freq: Every day | ORAL | 3 refills | Status: DC

## 2020-05-15 NOTE — Progress Notes (Signed)
Subjective:    Patient ID: Donna [name], female    DOB: 21-Apr-1961, 59 y.o.   MRN: [id number]  HPI  05/01/20 Patient is a very sweet 59 year old Caucasian female who comes in today to recheck her INR.  Chemotherapy 10 days ago, the patient's INR was 1.3.  This was taking Coumadin 10 mg a day 3 days a week and 7.5 mg 4 days a week.  We increased her Coumadin to 10 mg a day total.  She is been taking that for 7 days and her INR has increased to 1.5.  She is still very subtherapeutic.  She is also having increased neuropathic pain in both feet from her knees to her toes as well as in both hands which I believe is most likely due to the chemotherapy.  She is not taking B12 right now but she is planning on picking some up.  Her rheumatologist is requesting that we check a sed rate and a CRP to see if the rheumatoid arthritis could be playing a role.  I anticipate that the sed rate and CRP would likely be elevated due to her cancer treatments however I will be glad to check that.  She is also had low potassium with potassium of 3.0 and a hemoglobin dropped to 9 on her most recent chemotherapy profile.  She is still taking hydrochlorothiazide for hypertension however her blood pressure today is low.  Given the fact her blood pressure is low and her potassium is low and she is extremely tired, I do not think that the hydrochlorothiazide is beneficial to this patient.  At that time, my plan was: We will start the patient on 15 mg of Coumadin Monday, Wednesday, Friday, Saturday.  She will take 10 mg of Coumadin on Tuesday, Thursday, Sunday.  Recheck INR in 1 week.  Check CBC and CMP to monitor her electrolytes.  Check sed rate and CRP per the request of the rheumatologist.  Discontinue hydrochlorothiazide and increase gabapentin to 300 mg p.o. 3 times daily for neuropathy that I suspect is due to her chemotherapy treatment  05/15/20 Patient's INR today is 3.3.  She is currently taking 15 mg of Coumadin 4 days a  week and 12.5 mg of Coumadin 3 days a week.  Of note, her weight continues to rise.  She has pitting edema in her lower extremities and puffiness and swelling around her eyes and in her face.  She appears to be third spacing and developing anasarca.  She did stop hydrochlorothiazide due to low blood pressure after her last visit however I would not of expected such a dramatic increase in swelling.  At her last visit her calcium level was really low and I believe that this was due to low albumin although that was not checked.  Therefore I believe that she may be third spacing due to protein calorie malnutrition stemming from her underlying battle with cancer. Past Medical History:  Diagnosis Date  . Anxiety   . Arterial thrombosis (HCC)    right sfa, popliteal artery.  Require thromectomy at [facility] and lifelong anticoagulation.  . Cancer (HCC)    ovarian, recurrent on chemo (carboplatin, doxorubicin) at [facility]  . Colon polyp   . Depression   . Full dentures   . GERD (gastroesophageal reflux disease)   . HLD (hyperlipidemia) 12/03/2012  . Hypertension   . Hypothyroidism   . IBS (irritable bowel syndrome)   . Neck mass   . Neuropathy    feet  .  Rheumatoid arthritis ([place])    at [facility]  . Schatzki's ring   . Wears glasses    Past Surgical History:  Procedure Laterality Date  . ABDOMINAL HYSTERECTOMY    . ABLATION    . COLONOSCOPY    . CYST REMOVAL NECK  05/13/12  . EAR CYST EXCISION  05/13/2012   Procedure: CYST REMOVAL;  Surgeon: Ralene Ok, MD;  Location: [place];  Service: General;  Laterality: Left;  excision of neck cyst  . THYROID SURGERY  10/2011 - approximate   ablation  . TONSILLECTOMY    . UPPER GI ENDOSCOPY     Current Outpatient Medications on File Prior to Visit  Medication Sig Dispense Refill  . amLODipine (NORVASC) 10 MG tablet Take 1 tablet by mouth once daily 90 tablet 0  . atorvastatin (LIPITOR) 40 MG tablet Take 2 tablets by mouth once daily 180 tablet 0  .  Cholecalciferol (VITAMIN D3) 50 MCG (2000 UT) capsule     . dexamethasone (DECADRON) 4 MG tablet Take 2 tablets (8 mg) by mouth daily with breakfast for 3 days after chemotherapy each cycle.  Do not take prednisone on the days you take dexamethasone.    . diphenhydrAMINE (BENADRYL) 25 MG tablet Take 25 mg by mouth at bedtime as needed. For sleep    . escitalopram (LEXAPRO) 10 MG tablet Take 1 tablet (10 mg total) by mouth daily. 30 tablet 5  . gabapentin (NEURONTIN) 300 MG capsule Take 1 capsule (300 mg total) by mouth 2 (two) times daily. 60 capsule 11  . KLOR-CON M20 20 MEQ tablet Take 1 tablet by mouth once daily (Patient taking differently: 40 mEq.) 90 tablet 1  . leflunomide (ARAVA) 20 MG tablet Take 20 mg by mouth daily.    Marland Kitchen levothyroxine (SYNTHROID) 175 MCG tablet Take 1 tablet (175 mcg total) by mouth daily before breakfast. 90 tablet 1  . losartan (COZAAR) 50 MG tablet Take 1 tablet (50 mg total) by mouth daily. 90 tablet 3  . neomycin-colistin-hydrocortisone-thonzonium (CORTISPORIN-TC) 3.07-19-08-0.5 MG/ML OTIC suspension Place 4 drops into the right ear 4 (four) times daily. 10 mL 0  . nitrofurantoin, macrocrystal-monohydrate, (MACROBID) 100 MG capsule Take 100 mg by mouth 2 (two) times daily.    Marland Kitchen OLANZapine (ZYPREXA) 5 MG tablet Take 1 tablet by mouth nightly for 4 nights, starting on the day of chemotherapy each cycle.    Marland Kitchen omeprazole (PRILOSEC) 40 MG capsule Take 1 capsule (40 mg total) by mouth in the morning and at bedtime. 60 capsule 3  . ondansetron (ZOFRAN) 8 MG tablet Take by mouth.    . prochlorperazine (COMPAZINE) 10 MG tablet Take 1 tablet (10 mg) by mouth every six hours as needed for nausea if not controlled with other nausea medicines.    . vitamin B-12 (CYANOCOBALAMIN) 1000 MCG tablet Take 1,000 mcg by mouth daily.    Marland Kitchen warfarin (COUMADIN) 5 MG tablet TAKE 1 & 1/2 (ONE & ONE-HALF) TABLETS BY MOUTH ONCE DAILY ON TUE, THURS, SAT, & SUN. THEN TAKE 2 TABLETS BY MOUTH ONCE DAILY  ON OTHER DAYS 135 tablet 2   No current facility-administered medications on file prior to visit.   Allergies  Allergen Reactions  . Paclitaxel Other (See Comments)    Flushing, Itching, Throat tightness   Social History   Socioeconomic History  . Marital status: Single    Spouse name: Not on file  . Number of children: Not on file  . Years of education: Not on file  .  Highest education level: Not on file  Occupational History  . Not on file  Tobacco Use  . Smoking status: Former Smoker    Years: 32.00    Quit date: 06/19/2011    Years since quitting: 8.9  . Smokeless tobacco: Never Used  Vaping Use  . Vaping Use: Never used  Substance and Sexual Activity  . Alcohol use: No  . Drug use: No  . Sexual activity: Yes  Other Topics Concern  . Not on file  Social History Narrative  . Not on file   Social Determinants of Health   Financial Resource Strain: Not on file  Food Insecurity: Not on file  Transportation Needs: Not on file  Physical Activity: Not on file  Stress: Not on file  Social Connections: Not on file  Intimate Partner Violence: Not on file      Review of Systems  All other systems reviewed and are negative.      Objective:   Physical Exam Vitals reviewed.  Constitutional:      Appearance: She is normal weight.  Cardiovascular:     Rate and Rhythm: Normal rate and regular rhythm.     Heart sounds: Normal heart sounds.  Pulmonary:     Effort: Pulmonary effort is normal.     Breath sounds: Normal breath sounds.  Musculoskeletal:     Right lower leg: No edema.     Left lower leg: No edema.  Neurological:     Mental Status: She is alert.           Assessment & Plan:  On warfarin therapy - Plan: PT with INR/Fingerstick  Arterial thrombosis (HCC) - Plan: PT with INR/Fingerstick, COMPLETE METABOLIC PANEL WITH GFR, furosemide (LASIX) 20 MG tablet, CBC with Differential/Platelet  INR is elevated.  Decrease Coumadin to 15 mg on Monday  Wednesday Friday and 12.5 mg on Tuesday, Thursday, Saturday, Sunday.  Check CMP today.  Monitor potassium on Lasix.  I will give the patient Lasix 20 mg a day for swelling and pitting edema particular in her face and legs.  I believe she is most likely third spacing and developing anasarca secondary to protein calorie malnutrition.  Therefore I will check an albumin level to confirm my suspicion.

## 2020-05-16 ENCOUNTER — Encounter: Payer: Self-pay | Admitting: Family Medicine

## 2020-05-25 ENCOUNTER — Encounter: Payer: Self-pay | Admitting: Family Medicine

## 2020-06-06 ENCOUNTER — Encounter: Payer: Self-pay | Admitting: Podiatry

## 2020-06-06 ENCOUNTER — Other Ambulatory Visit: Payer: Self-pay

## 2020-06-06 ENCOUNTER — Ambulatory Visit (INDEPENDENT_AMBULATORY_CARE_PROVIDER_SITE_OTHER): Payer: Self-pay | Admitting: Podiatry

## 2020-06-06 DIAGNOSIS — S93401A Sprain of unspecified ligament of right ankle, initial encounter: Secondary | ICD-10-CM

## 2020-06-06 DIAGNOSIS — M7751 Other enthesopathy of right foot: Secondary | ICD-10-CM

## 2020-06-06 DIAGNOSIS — M722 Plantar fascial fibromatosis: Secondary | ICD-10-CM

## 2020-06-06 NOTE — Progress Notes (Signed)
Subjective:  Patient ID: Donna [name], female    DOB: 06-09-1960,  MRN: [id number]  Chief Complaint  Patient presents with  . Foot Pain    Right foot pain. PT stated that it has not got any better.    60 y.o. female presents with the above complaint.  Patient presents with complaint of right ankle pain lateral side.  Patient stating of the last few days just came out of nowhere.  Patient has a history of rheumatoid arthritis.  She feels like there might be a flareup of her rheumatoid arthritis.  She is being managed with medication.  Pain is constant and there is some mild compensatory pain to the heel.  She has not tried anything else.  She denies any other acute complaints.  She has not seen anyone else prior to seeing me   Review of Systems: Negative except as noted in the HPI. Denies N/V/F/Ch.  Past Medical History:  Diagnosis Date  . Anxiety   . Arterial thrombosis (HCC)    right sfa, popliteal artery.  Require thromectomy at [facility] and lifelong anticoagulation.  . Cancer ([place])    ovarian, recurrent on chemo (carboplatin, doxorubicin) at [facility]  . Colon polyp   . Depression   . Full dentures   . GERD (gastroesophageal reflux disease)   . HLD (hyperlipidemia) 12/03/2012  . Hypertension   . Hypothyroidism   . IBS (irritable bowel syndrome)   . Neck mass   . Neuropathy    feet  . Rheumatoid arthritis ([place])    at [facility]  . Schatzki's ring   . Wears glasses     Current Outpatient Medications:  .  amLODipine (NORVASC) 10 MG tablet, Take 1 tablet by mouth once daily, Disp: 90 tablet, Rfl: 0 .  atorvastatin (LIPITOR) 40 MG tablet, Take 2 tablets by mouth once daily, Disp: 180 tablet, Rfl: 0 .  Cholecalciferol (VITAMIN D3) 50 MCG (2000 UT) capsule, , Disp: , Rfl:  .  dexamethasone (DECADRON) 4 MG tablet, Take 2 tablets (8 mg) by mouth daily with breakfast for 3 days after chemotherapy each cycle.  Do not take prednisone on the days you take dexamethasone., Disp: , Rfl:  .   diphenhydrAMINE (BENADRYL) 25 MG tablet, Take 25 mg by mouth at bedtime as needed. For sleep, Disp: , Rfl:  .  escitalopram (LEXAPRO) 10 MG tablet, Take 1 tablet (10 mg total) by mouth daily., Disp: 30 tablet, Rfl: 5 .  furosemide (LASIX) 20 MG tablet, Take 1 tablet (20 mg total) by mouth daily., Disp: 30 tablet, Rfl: 3 .  gabapentin (NEURONTIN) 300 MG capsule, Take 1 capsule (300 mg total) by mouth 2 (two) times daily., Disp: 60 capsule, Rfl: 11 .  KLOR-CON M20 20 MEQ tablet, Take 1 tablet by mouth once daily (Patient taking differently: 40 mEq.), Disp: 90 tablet, Rfl: 1 .  leflunomide (ARAVA) 20 MG tablet, Take 20 mg by mouth daily., Disp: , Rfl:  .  levothyroxine (SYNTHROID) 175 MCG tablet, Take 1 tablet (175 mcg total) by mouth daily before breakfast., Disp: 90 tablet, Rfl: 1 .  losartan (COZAAR) 50 MG tablet, Take 1 tablet (50 mg total) by mouth daily., Disp: 90 tablet, Rfl: 3 .  neomycin-colistin-hydrocortisone-thonzonium (CORTISPORIN-TC) 3.07-19-08-0.5 MG/ML OTIC suspension, Place 4 drops into the right ear 4 (four) times daily., Disp: 10 mL, Rfl: 0 .  nitrofurantoin, macrocrystal-monohydrate, (MACROBID) 100 MG capsule, Take 100 mg by mouth 2 (two) times daily., Disp: , Rfl:  .  OLANZapine (ZYPREXA) 5  MG tablet, Take 1 tablet by mouth nightly for 4 nights, starting on the day of chemotherapy each cycle., Disp: , Rfl:  .  omeprazole (PRILOSEC) 40 MG capsule, Take 1 capsule (40 mg total) by mouth in the morning and at bedtime., Disp: 60 capsule, Rfl: 3 .  ondansetron (ZOFRAN) 8 MG tablet, Take by mouth., Disp: , Rfl:  .  prochlorperazine (COMPAZINE) 10 MG tablet, Take 1 tablet (10 mg) by mouth every six hours as needed for nausea if not controlled with other nausea medicines., Disp: , Rfl:  .  vitamin B-12 (CYANOCOBALAMIN) 1000 MCG tablet, Take 1,000 mcg by mouth daily., Disp: , Rfl:  .  warfarin (COUMADIN) 5 MG tablet, TAKE 1 & 1/2 (ONE & ONE-HALF) TABLETS BY MOUTH ONCE DAILY ON TUE, THURS, SAT, &  SUN. THEN TAKE 2 TABLETS BY MOUTH ONCE DAILY ON OTHER DAYS, Disp: 135 tablet, Rfl: 2  Social History   Tobacco Use  Smoking Status Former Smoker  . Years: 32.00  . Quit date: 06/19/2011  . Years since quitting: 8.9  Smokeless Tobacco Never Used    Allergies  Allergen Reactions  . Paclitaxel Other (See Comments)    Flushing, Itching, Throat tightness   Objective:  There were no vitals filed for this visit. There is no height or weight on file to calculate BMI. Constitutional Well developed. Well nourished.  Vascular Dorsalis pedis pulses palpable bilaterally. Posterior tibial pulses palpable bilaterally. Capillary refill normal to all digits.  No cyanosis or clubbing noted. Pedal hair growth normal.  Neurologic Normal speech. Oriented to person, place, and time. Epicritic sensation to light touch grossly present bilaterally.  Dermatologic Nails well groomed and normal in appearance. No open wounds. No skin lesions.  Orthopedic:  Pain on palpation right lateral ankle gutter.  Pain with range of motion of the ankle joint.  No deep intra-articular pain noted.  Pain with plantarflexion inversion of the foot as well.  Pain at the ATFL ligament as well.  No pain at the sinus tarsi.  Tender to palpation at the calcaneal tuber right. No pain with calcaneal squeeze right. Ankle ROM diminished range of motion right. Silfverskiold Test: positive right.   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  Midfoot arthritis noted.  No other bony abnormalities identified.  Plantar and posterior spurring noted.  Assessment:   1. Capsulitis of ankle, right   2. Moderate ankle sprain, right, initial encounter   3. Plantar fasciitis, right    Plan:  Patient was evaluated and treated and all questions answered.  Right ankle capsulitis versus ankle sprain -Clinically her pain did not get resolved with a steroid injection and cam  boot immobilization.  She was also noncompliant with a cam boot immobilization as well.  At this time given that she still has a continuous pain as well as now compensated plantar fasciitis I believe she will benefit from MRI evaluation to rule out any kind of acute tendon tear or ATFL tear.  I discussed this with patient extensive detail.  Patient states understanding and would like to proceed with that. -MRI was ordered to rule out osteochondral lesion versus ligament tear  Plantar Fasciitis, right - XR reviewed as above.  - Educated on icing and stretching. Instructions given.  - Injection delivered to the plantar fascia as below. - DME: Plantar Fascial Brace - Pharmacologic management: None  Procedure: Injection Tendon/Ligament Location: Right plantar fascia at the glabrous junction; medial approach.  Skin Prep: alcohol Injectate: 0.5 cc 0.5% marcaine plain, 0.5 cc of 1% Lidocaine, 0.5 cc kenalog 10. Disposition: Patient tolerated procedure well. Injection site dressed with a band-aid.  No follow-ups on file.  No follow-ups on file.

## 2020-06-11 ENCOUNTER — Other Ambulatory Visit: Payer: Self-pay

## 2020-06-11 ENCOUNTER — Telehealth: Payer: Self-pay | Admitting: Family Medicine

## 2020-06-11 DIAGNOSIS — Z20822 Contact with and (suspected) exposure to covid-19: Secondary | ICD-10-CM

## 2020-06-11 NOTE — Telephone Encounter (Signed)
Patient called in states that her brother was seen last week and has been dx with covid. She would like to know if she needs to quarantine and get tested as well. She does have chemo on Friday.  CB# [phone]

## 2020-06-11 NOTE — Telephone Encounter (Signed)
Call placed to patient.   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 States that she has no symptoms, but would like to be tested.   Advised to come to office for swab this afternoon.

## 2020-06-13 ENCOUNTER — Other Ambulatory Visit: Payer: Self-pay | Admitting: Family Medicine

## 2020-06-13 LAB — SARS-COV-2 RNA,(COVID-19) QUALITATIVE NAAT: SARS CoV2 RNA: NOT DETECTED

## 2020-06-14 ENCOUNTER — Other Ambulatory Visit: Payer: Self-pay

## 2020-06-14 ENCOUNTER — Ambulatory Visit (INDEPENDENT_AMBULATORY_CARE_PROVIDER_SITE_OTHER): Payer: Self-pay | Admitting: Family Medicine

## 2020-06-14 ENCOUNTER — Encounter: Payer: Self-pay | Admitting: Family Medicine

## 2020-06-14 VITALS — BP 118/80 | HR 81 | Temp 97.2°F | Ht 67.0 in | Wt 242.0 lb

## 2020-06-14 DIAGNOSIS — I749 Embolism and thrombosis of unspecified artery: Secondary | ICD-10-CM

## 2020-06-14 LAB — PT WITH INR/FINGERSTICK
INR, fingerstick: 2.2 ratio — ABNORMAL HIGH
PT, fingerstick: 26.6 s — ABNORMAL HIGH (ref 10.5–13.1)

## 2020-06-14 NOTE — Progress Notes (Signed)
Subjective:    Patient ID: Donna [name], female    DOB: 1960-10-15, 60 y.o.   MRN: [id number]  HPI  Patient is a very sweet 60 year old Caucasian female who is here today to recheck her Coumadin.  She is currently taking 12.5 mg of Coumadin every day.  Her INR today is therapeutic at 2.2.  She is scheduled to go for chemotherapy tomorrow at [facility].  She is currently battling ovarian cancer.  She also brings in several of her brothers blood sugars.  He is currently on 62 units of Basaglar every day.  Fasting blood sugars are between 120 and 155.  2-hour postprandial sugars are 2 50-300.  Although this was not his visit, I increased his Basaglar to 65 units and then added Trulicity 4.96 mg weekly and I will stop his Tradjenta which she has not been taking. Past Medical History:  Diagnosis Date  . Anxiety   . Arterial thrombosis (HCC)    right sfa, popliteal artery.  Require thromectomy at [facility] and lifelong anticoagulation.  . Cancer ([place])    ovarian, recurrent on chemo (carboplatin, doxorubicin) at [facility]  . Colon polyp   . Depression   . Full dentures   . GERD (gastroesophageal reflux disease)   . HLD (hyperlipidemia) 12/03/2012  . Hypertension   . Hypothyroidism   . IBS (irritable bowel syndrome)   . Neck mass   . Neuropathy    feet  . Rheumatoid arthritis ([place])    at [facility]  . Schatzki's ring   . Wears glasses    Past Surgical History:  Procedure Laterality Date  . ABDOMINAL HYSTERECTOMY    . ABLATION    . COLONOSCOPY    . CYST REMOVAL NECK  05/13/12  . EAR CYST EXCISION  05/13/2012   Procedure: CYST REMOVAL;  Surgeon: Ralene Ok, MD;  Location: [place];  Service: General;  Laterality: Left;  excision of neck cyst  . THYROID SURGERY  10/2011 - approximate   ablation  . TONSILLECTOMY    . UPPER GI ENDOSCOPY     Current Outpatient Medications on File Prior to Visit  Medication Sig Dispense Refill  . amLODipine (NORVASC) 10 MG tablet Take 1 tablet by mouth once daily 90 tablet  0  . atorvastatin (LIPITOR) 40 MG tablet Take 2 tablets by mouth once daily 180 tablet 0  . Cholecalciferol (VITAMIN D3) 50 MCG (2000 UT) capsule     . dexamethasone (DECADRON) 4 MG tablet Take 2 tablets (8 mg) by mouth daily with breakfast for 3 days after chemotherapy each cycle.  Do not take prednisone on the days you take dexamethasone.    . diphenhydrAMINE (BENADRYL) 25 MG tablet Take 25 mg by mouth at bedtime as needed. For sleep    . escitalopram (LEXAPRO) 10 MG tablet Take 1 tablet (10 mg total) by mouth daily. 30 tablet 5  . furosemide (LASIX) 20 MG tablet Take 1 tablet (20 mg total) by mouth daily. 30 tablet 3  . gabapentin (NEURONTIN) 300 MG capsule Take 1 capsule (300 mg total) by mouth 2 (two) times daily. 60 capsule 11  . KLOR-CON M20 20 MEQ tablet Take 1 tablet by mouth once daily (Patient taking differently: 40 mEq.) 90 tablet 1  . leflunomide (ARAVA) 20 MG tablet Take 20 mg by mouth daily.    Marland Kitchen levothyroxine (SYNTHROID) 175 MCG tablet Take 1 tablet (175 mcg total) by mouth daily before breakfast. 90 tablet 1  . losartan (COZAAR) 50 MG tablet Take  1 tablet (50 mg total) by mouth daily. 90 tablet 3  . neomycin-colistin-hydrocortisone-thonzonium (CORTISPORIN-TC) 3.07-19-08-0.5 MG/ML OTIC suspension Place 4 drops into the right ear 4 (four) times daily. 10 mL 0  . nitrofurantoin, macrocrystal-monohydrate, (MACROBID) 100 MG capsule Take 100 mg by mouth 2 (two) times daily.    Marland Kitchen OLANZapine (ZYPREXA) 5 MG tablet Take 1 tablet by mouth nightly for 4 nights, starting on the day of chemotherapy each cycle.    Marland Kitchen omeprazole (PRILOSEC) 40 MG capsule Take 1 capsule (40 mg total) by mouth in the morning and at bedtime. 60 capsule 3  . ondansetron (ZOFRAN) 8 MG tablet Take by mouth.    . prochlorperazine (COMPAZINE) 10 MG tablet Take 1 tablet (10 mg) by mouth every six hours as needed for nausea if not controlled with other nausea medicines.    . vitamin B-12 (CYANOCOBALAMIN) 1000 MCG tablet Take  1,000 mcg by mouth daily.    Marland Kitchen warfarin (COUMADIN) 5 MG tablet TAKE 1 & 1/2 (ONE & ONE-HALF) TABLETS BY MOUTH ONCE DAILY ON TUE, THURS, SAT, & SUN. THEN TAKE 2 TABLETS BY MOUTH ONCE DAILY ON OTHER DAYS 135 tablet 2   No current facility-administered medications on file prior to visit.   Allergies  Allergen Reactions  . Paclitaxel Other (See Comments)    Flushing, Itching, Throat tightness   Social History   Socioeconomic History  . Marital status: Single    Spouse name: Not on file  . Number of children: Not on file  . Years of education: Not on file  . Highest education level: Not on file  Occupational History  . Not on file  Tobacco Use  . Smoking status: Former Smoker    Years: 32.00    Quit date: 06/19/2011    Years since quitting: 8.9  . Smokeless tobacco: Never Used  Vaping Use  . Vaping Use: Never used  Substance and Sexual Activity  . Alcohol use: No  . Drug use: No  . Sexual activity: Yes  Other Topics Concern  . Not on file  Social History Narrative  . Not on file   Social Determinants of Health   Financial Resource Strain: Not on file  Food Insecurity: Not on file  Transportation Needs: Not on file  Physical Activity: Not on file  Stress: Not on file  Social Connections: Not on file  Intimate Partner Violence: Not on file      Review of Systems  All other systems reviewed and are negative.      Objective:   Physical Exam Vitals reviewed.  Constitutional:      Appearance: She is normal weight.  Cardiovascular:     Rate and Rhythm: Normal rate and regular rhythm.     Heart sounds: Normal heart sounds.  Pulmonary:     Effort: Pulmonary effort is normal.     Breath sounds: Normal breath sounds.  Musculoskeletal:     Right lower leg: No edema.     Left lower leg: No edema.  Neurological:     Mental Status: She is alert.           Assessment & Plan:  Arterial thrombosis ([place]) - Plan: PT with INR/Fingerstick  INR is therapeutic.   Continue Coumadin 12.5 mg daily and recheck INR in 6 weeks

## 2020-07-04 ENCOUNTER — Ambulatory Visit (INDEPENDENT_AMBULATORY_CARE_PROVIDER_SITE_OTHER): Payer: Managed Care, Other (non HMO) | Admitting: Podiatry

## 2020-07-04 ENCOUNTER — Other Ambulatory Visit: Payer: Self-pay | Admitting: Family Medicine

## 2020-07-04 ENCOUNTER — Other Ambulatory Visit: Payer: Self-pay

## 2020-07-04 DIAGNOSIS — M722 Plantar fascial fibromatosis: Secondary | ICD-10-CM | POA: Diagnosis not present

## 2020-07-04 DIAGNOSIS — S93401A Sprain of unspecified ligament of right ankle, initial encounter: Secondary | ICD-10-CM

## 2020-07-05 ENCOUNTER — Encounter: Payer: Self-pay | Admitting: Podiatry

## 2020-07-05 NOTE — Progress Notes (Signed)
Subjective:  Patient ID: Donna [name], female    DOB: 24-Sep-1960,  MRN: [id number]  Chief Complaint  Patient presents with  . Foot Pain    Pt states not contacted for MRI scheduling. Still experiencing pain. Using knee scooter to get around. Has been wearing PF Brace which helps.    60 y.o. female presents with the above complaint.  Patient presents with complaint of right ankle pain lateral side.  Patient stating of the last few days just came out of nowhere.  Patient has a history of rheumatoid arthritis.  She feels like there might be a flareup of her rheumatoid arthritis.  She is being managed with medication.  Pain is constant and there is some mild compensatory pain to the heel.  She has not tried anything else.  She denies any other acute complaints.  She has not seen anyone else prior to seeing me   Review of Systems: Negative except as noted in the HPI. Denies N/V/F/Ch.  Past Medical History:  Diagnosis Date  . Anxiety   . Arterial thrombosis (HCC)    right sfa, popliteal artery.  Require thromectomy at [facility] and lifelong anticoagulation.  . Cancer ([place])    ovarian, recurrent on chemo (carboplatin, doxorubicin) at [facility]  . Colon polyp   . Depression   . Full dentures   . GERD (gastroesophageal reflux disease)   . HLD (hyperlipidemia) 12/03/2012  . Hypertension   . Hypothyroidism   . IBS (irritable bowel syndrome)   . Neck mass   . Neuropathy    feet  . Rheumatoid arthritis ([place])    at [facility]  . Schatzki's ring   . Wears glasses     Current Outpatient Medications:  .  amLODipine (NORVASC) 10 MG tablet, Take 1 tablet by mouth once daily, Disp: 90 tablet, Rfl: 0 .  atorvastatin (LIPITOR) 40 MG tablet, Take 2 tablets by mouth once daily, Disp: 180 tablet, Rfl: 0 .  Cholecalciferol (VITAMIN D3) 50 MCG (2000 UT) capsule, , Disp: , Rfl:  .  dexamethasone (DECADRON) 4 MG tablet, Take 2 tablets (8 mg) by mouth daily with breakfast for 3 days after chemotherapy each cycle.  Do  not take prednisone on the days you take dexamethasone., Disp: , Rfl:  .  diphenhydrAMINE (BENADRYL) 25 MG tablet, Take 25 mg by mouth at bedtime as needed. For sleep, Disp: , Rfl:  .  escitalopram (LEXAPRO) 10 MG tablet, Take 1 tablet (10 mg total) by mouth daily., Disp: 30 tablet, Rfl: 5 .  furosemide (LASIX) 20 MG tablet, Take 1 tablet (20 mg total) by mouth daily., Disp: 30 tablet, Rfl: 3 .  gabapentin (NEURONTIN) 300 MG capsule, Take 1 capsule (300 mg total) by mouth 2 (two) times daily., Disp: 60 capsule, Rfl: 11 .  leflunomide (ARAVA) 20 MG tablet, Take 20 mg by mouth daily., Disp: , Rfl:  .  levothyroxine (SYNTHROID) 175 MCG tablet, Take 1 tablet (175 mcg total) by mouth daily before breakfast., Disp: 90 tablet, Rfl: 1 .  losartan (COZAAR) 50 MG tablet, Take 1 tablet (50 mg total) by mouth daily., Disp: 90 tablet, Rfl: 3 .  neomycin-colistin-hydrocortisone-thonzonium (CORTISPORIN-TC) 3.07-19-08-0.5 MG/ML OTIC suspension, Place 4 drops into the right ear 4 (four) times daily., Disp: 10 mL, Rfl: 0 .  nitrofurantoin, macrocrystal-monohydrate, (MACROBID) 100 MG capsule, Take 100 mg by mouth 2 (two) times daily., Disp: , Rfl:  .  OLANZapine (ZYPREXA) 5 MG tablet, Take 1 tablet by mouth nightly for 4 nights, starting on  the day of chemotherapy each cycle., Disp: , Rfl:  .  omeprazole (PRILOSEC) 40 MG capsule, Take 1 capsule (40 mg total) by mouth in the morning and at bedtime., Disp: 60 capsule, Rfl: 3 .  ondansetron (ZOFRAN) 8 MG tablet, Take by mouth., Disp: , Rfl:  .  prochlorperazine (COMPAZINE) 10 MG tablet, Take 1 tablet (10 mg) by mouth every six hours as needed for nausea if not controlled with other nausea medicines., Disp: , Rfl:  .  vitamin B-12 (CYANOCOBALAMIN) 1000 MCG tablet, Take 1,000 mcg by mouth daily., Disp: , Rfl:  .  warfarin (COUMADIN) 5 MG tablet, TAKE 1 & 1/2 (ONE & ONE-HALF) TABLETS BY MOUTH ONCE DAILY ON TUE, THURS, SAT, & SUN. THEN TAKE 2 TABLETS BY MOUTH ONCE DAILY ON OTHER  DAYS, Disp: 135 tablet, Rfl: 2 .  potassium chloride SA (KLOR-CON) 20 MEQ tablet, Take 1 tablet by mouth once daily, Disp: 90 tablet, Rfl: 0  Social History   Tobacco Use  Smoking Status Former Smoker  . Years: 32.00  . Quit date: 06/19/2011  . Years since quitting: 9.0  Smokeless Tobacco Never Used    Allergies  Allergen Reactions  . Paclitaxel Other (See Comments)    Flushing, Itching, Throat tightness   Objective:  There were no vitals filed for this visit. There is no height or weight on file to calculate BMI. Constitutional Well developed. Well nourished.  Vascular Dorsalis pedis pulses palpable bilaterally. Posterior tibial pulses palpable bilaterally. Capillary refill normal to all digits.  No cyanosis or clubbing noted. Pedal hair growth normal.  Neurologic Normal speech. Oriented to person, place, and time. Epicritic sensation to light touch grossly present bilaterally.  Dermatologic Nails well groomed and normal in appearance. No open wounds. No skin lesions.  Orthopedic:  Pain on palpation right lateral ankle gutter.  Pain with range of motion of the ankle joint.  No deep intra-articular pain noted.  Pain with plantarflexion inversion of the foot as well.  Pain at the ATFL ligament as well.  No pain at the sinus tarsi.  Tender to palpation at the calcaneal tuber right. No pain with calcaneal squeeze right. Ankle ROM diminished range of motion right. Silfverskiold Test: positive right.   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  Midfoot arthritis noted.  No other bony abnormalities identified.  Plantar and posterior spurring noted.  Assessment:   1. Moderate ankle sprain, right, initial encounter   2. Plantar fasciitis, right    Plan:  Patient was evaluated and treated and all questions answered.  Right ankle capsulitis versus ankle sprain -Clinically her pain did not get resolved with a  steroid injection and cam boot immobilization.  She was also noncompliant with a cam boot immobilization as well.  At this time given that she still has a continuous pain as well as now compensated plantar fasciitis I believe she will benefit from MRI evaluation to rule out any kind of acute tendon tear or ATFL tear.  I discussed this with patient extensive detail.  Patient states understanding and would like to proceed with that. -MRI was re-ordered to rule out osteochondral lesion versus ligament tear -Continue using cam boot to the right side  Plantar Fasciitis, right -Clinically improving however still continues to have pain.  She has been wearing her braces.  We will continue to wear the brace.   No follow-ups on file.  No follow-ups on file.

## 2020-07-06 ENCOUNTER — Ambulatory Visit: Payer: Self-pay | Admitting: Podiatry

## 2020-07-13 ENCOUNTER — Other Ambulatory Visit: Payer: Self-pay | Admitting: Family Medicine

## 2020-07-17 ENCOUNTER — Encounter: Payer: Self-pay | Admitting: Family Medicine

## 2020-07-17 ENCOUNTER — Ambulatory Visit: Payer: Managed Care, Other (non HMO) | Admitting: Family Medicine

## 2020-07-17 ENCOUNTER — Other Ambulatory Visit: Payer: Self-pay

## 2020-07-17 ENCOUNTER — Other Ambulatory Visit: Payer: Self-pay | Admitting: Family Medicine

## 2020-07-17 VITALS — BP 126/80 | HR 71 | Temp 98.4°F | Resp 19 | Ht 67.0 in | Wt 247.0 lb

## 2020-07-17 DIAGNOSIS — I749 Embolism and thrombosis of unspecified artery: Secondary | ICD-10-CM | POA: Diagnosis not present

## 2020-07-17 DIAGNOSIS — Z7901 Long term (current) use of anticoagulants: Secondary | ICD-10-CM | POA: Diagnosis not present

## 2020-07-17 DIAGNOSIS — M25551 Pain in right hip: Secondary | ICD-10-CM

## 2020-07-17 DIAGNOSIS — E876 Hypokalemia: Secondary | ICD-10-CM | POA: Diagnosis not present

## 2020-07-17 LAB — COMPLETE METABOLIC PANEL WITH GFR
AG Ratio: 2.1 (calc) (ref 1.0–2.5)
ALT: 15 U/L (ref 6–29)
AST: 26 U/L (ref 10–35)
Albumin: 4.1 g/dL (ref 3.6–5.1)
Alkaline phosphatase (APISO): 74 U/L (ref 37–153)
BUN: 12 mg/dL (ref 7–25)
CO2: 28 mmol/L (ref 20–32)
Calcium: 7.9 mg/dL — ABNORMAL LOW (ref 8.6–10.4)
Chloride: 101 mmol/L (ref 98–110)
Creat: 0.71 mg/dL (ref 0.50–0.99)
GFR, Est African American: 107 mL/min/{1.73_m2} (ref >=60)
GFR, Est Non African American: 92 mL/min/{1.73_m2} (ref >=60)
Globulin: 2 g/dL (calc) (ref 1.9–3.7)
Glucose, Bld: 89 mg/dL (ref 65–99)
Potassium: 3.4 mmol/L — ABNORMAL LOW (ref 3.5–5.3)
Sodium: 141 mmol/L (ref 135–146)
Total Bilirubin: 0.4 mg/dL (ref 0.2–1.2)
Total Protein: 6.1 g/dL (ref 6.1–8.1)

## 2020-07-17 LAB — PT WITH INR/FINGERSTICK
INR, fingerstick: 2 ratio — ABNORMAL HIGH
PT, fingerstick: 24.5 s — ABNORMAL HIGH (ref 10.5–13.1)

## 2020-07-17 MED ORDER — PREDNISONE 20 MG PO TABS: ORAL_TABLET | ORAL | 0 refills | Status: DC

## 2020-07-17 MED ORDER — ENOXAPARIN SODIUM 120 MG/0.8ML ~~LOC~~ SOLN: 120.0000 mg | SUBCUTANEOUS | 0 refills | Status: DC

## 2020-07-17 MED ORDER — POTASSIUM CHLORIDE CRYS ER 20 MEQ PO TBCR: 40.0000 meq | EXTENDED_RELEASE_TABLET | Freq: Every day | ORAL | 2 refills | Status: DC

## 2020-07-17 NOTE — Progress Notes (Signed)
Subjective:    Patient ID: Donna [name], female    DOB: 1961/03/08, 60 y.o.   MRN: [id number]  HPI  Patient is a very sweet 60 year old Caucasian female with ovarian cancer who is here today to recheck her Coumadin.  She is currently taking 12.5 mg of Coumadin every day.  Her INR today is 2.0 and therapeutic.  She complains of pain in the posterior aspect of both hips.  She also has numbness and tingling radiating down both legs.  She has attributed this to her chemotherapy and neuropathy related to the chemotherapy however she is now having pain in her hips when she stands and walks.  She believes that some of that is likely due to the way that she has been walking because of her feet pain.  She has severe pain in both ankles.  She denies any shooting sciatica-like pain however the pain in her hips is in the posterior aspect in the gluteus near the ischial tuberosity.  She has no anterior hip pain and no pain with range of motion of the hip.  She is on 40 mEq a day of K dur due to chemotherapy induced hypokalemia Past Medical History:  Diagnosis Date  . Anxiety   . Arterial thrombosis (HCC)    right sfa, popliteal artery.  Require thromectomy at [facility] and lifelong anticoagulation.  . Cancer ([place])    ovarian, recurrent on chemo (carboplatin, doxorubicin) at [facility]  . Colon polyp   . Depression   . Full dentures   . GERD (gastroesophageal reflux disease)   . HLD (hyperlipidemia) 12/03/2012  . Hypertension   . Hypothyroidism   . IBS (irritable bowel syndrome)   . Neck mass   . Neuropathy    feet  . Rheumatoid arthritis ([place])    at [facility]  . Schatzki's ring   . Wears glasses    Past Surgical History:  Procedure Laterality Date  . ABDOMINAL HYSTERECTOMY    . ABLATION    . COLONOSCOPY    . CYST REMOVAL NECK  05/13/12  . EAR CYST EXCISION  05/13/2012   Procedure: CYST REMOVAL;  Surgeon: Ralene Ok, MD;  Location: [address];  Service: General;  Laterality: Left;  excision of neck cyst  .  THYROID SURGERY  10/2011 - approximate   ablation  . TONSILLECTOMY    . UPPER GI ENDOSCOPY     Current Outpatient Medications on File Prior to Visit  Medication Sig Dispense Refill  . amLODipine (NORVASC) 10 MG tablet Take 1 tablet by mouth once daily 90 tablet 0  . atorvastatin (LIPITOR) 40 MG tablet Take 2 tablets by mouth once daily 180 tablet 0  . dexamethasone (DECADRON) 4 MG tablet Take 2 tablets (8 mg) by mouth daily with breakfast for 3 days after chemotherapy each cycle.  Do not take prednisone on the days you take dexamethasone.    . diphenhydrAMINE (BENADRYL) 25 MG tablet Take 25 mg by mouth at bedtime as needed. For sleep    . furosemide (LASIX) 20 MG tablet Take 1 tablet (20 mg total) by mouth daily. 30 tablet 3  . gabapentin (NEURONTIN) 300 MG capsule Take 1 capsule (300 mg total) by mouth 2 (two) times daily. 60 capsule 11  . leflunomide (ARAVA) 20 MG tablet Take 20 mg by mouth daily.    Marland Kitchen losartan (COZAAR) 50 MG tablet Take 1 tablet by mouth once daily 90 tablet 0  . OLANZapine (ZYPREXA) 5 MG tablet Take 1 tablet by mouth  nightly for 4 nights, starting on the day of chemotherapy each cycle.    . ondansetron (ZOFRAN) 8 MG tablet Take by mouth.    . prochlorperazine (COMPAZINE) 10 MG tablet Take 1 tablet (10 mg) by mouth every six hours as needed for nausea if not controlled with other nausea medicines.    . vitamin B-12 (CYANOCOBALAMIN) 1000 MCG tablet Take 1,000 mcg by mouth daily.    Marland Kitchen warfarin (COUMADIN) 5 MG tablet TAKE 1 & 1/2 (ONE & ONE-HALF) TABLETS BY MOUTH ONCE DAILY ON TUE, THURS, SAT, & SUN. THEN TAKE 2 TABLETS BY MOUTH ONCE DAILY ON OTHER DAYS 135 tablet 2  . Cholecalciferol (VITAMIN D3) 50 MCG (2000 UT) capsule     . escitalopram (LEXAPRO) 10 MG tablet Take 1 tablet (10 mg total) by mouth daily. (Patient not taking: Reported on 07/17/2020) 30 tablet 5  . levothyroxine (SYNTHROID) 175 MCG tablet Take 1 tablet (175 mcg total) by mouth daily before breakfast. 90 tablet 1   . neomycin-colistin-hydrocortisone-thonzonium (CORTISPORIN-TC) 3.07-19-08-0.5 MG/ML OTIC suspension Place 4 drops into the right ear 4 (four) times daily. 10 mL 0  . nitrofurantoin, macrocrystal-monohydrate, (MACROBID) 100 MG capsule Take 100 mg by mouth 2 (two) times daily.    Marland Kitchen omeprazole (PRILOSEC) 40 MG capsule Take 1 capsule (40 mg total) by mouth in the morning and at bedtime. 60 capsule 3  . potassium chloride SA (KLOR-CON) 20 MEQ tablet Take 1 tablet by mouth once daily (Patient not taking: Reported on 07/17/2020) 90 tablet 0   No current facility-administered medications on file prior to visit.   Allergies  Allergen Reactions  . Paclitaxel Other (See Comments)    Flushing, Itching, Throat tightness   Social History   Socioeconomic History  . Marital status: Single    Spouse name: Not on file  . Number of children: Not on file  . Years of education: Not on file  . Highest education level: Not on file  Occupational History  . Not on file  Tobacco Use  . Smoking status: Former Smoker    Years: 32.00    Quit date: 06/19/2011    Years since quitting: 9.0  . Smokeless tobacco: Never Used  Vaping Use  . Vaping Use: Never used  Substance and Sexual Activity  . Alcohol use: No  . Drug use: No  . Sexual activity: Yes  Other Topics Concern  . Not on file  Social History Narrative  . Not on file   Social Determinants of Health   Financial Resource Strain: Not on file  Food Insecurity: Not on file  Transportation Needs: Not on file  Physical Activity: Not on file  Stress: Not on file  Social Connections: Not on file  Intimate Partner Violence: Not on file      Review of Systems  All other systems reviewed and are negative.      Objective:   Physical Exam Vitals reviewed.  Constitutional:      Appearance: She is normal weight.  Cardiovascular:     Rate and Rhythm: Normal rate and regular rhythm.     Heart sounds: Normal heart sounds.  Pulmonary:     Effort:  Pulmonary effort is normal.     Breath sounds: Normal breath sounds.  Musculoskeletal:     Right lower leg: No edema.     Left lower leg: No edema.  Neurological:     Mental Status: She is alert.           Assessment &  Plan:  Arterial thrombosis ([place]) - Plan: PT with INR/Fingerstick  On warfarin therapy - Plan: PT with INR/Fingerstick  INR is therapeutic.  Continue Coumadin 12.5 mg daily and recheck INR in 6 weeks.  I think her posterior hip pain may be due to sciatica.  Begin prednisone taper pack and obtain x-rays of the pelvis and hips just to make sure that there is no severe osteoarthritis however the presentation to me sounds more like lumbar radiculopathy.  Check BMP and potassium just to ensure that the 40 mEq of potassium daily is sufficient

## 2020-07-18 ENCOUNTER — Ambulatory Visit: Payer: Managed Care, Other (non HMO) | Admitting: Podiatry

## 2020-07-20 ENCOUNTER — Ambulatory Visit
Admission: RE | Admit: 2020-07-20 | Discharge: 2020-07-20 | Disposition: A | Discharge status: Home / Self Care | Payer: Managed Care, Other (non HMO) | Source: Ambulatory Visit | Attending: Podiatry | Admitting: Podiatry

## 2020-07-20 DIAGNOSIS — M722 Plantar fascial fibromatosis: Secondary | ICD-10-CM

## 2020-07-20 DIAGNOSIS — S93401A Sprain of unspecified ligament of right ankle, initial encounter: Secondary | ICD-10-CM

## 2020-07-23 ENCOUNTER — Telehealth: Payer: Self-pay | Admitting: Family Medicine

## 2020-07-23 NOTE — Telephone Encounter (Signed)
Patient is scheduled to have a colonoscopy in a few weeks she states that they are requesting her to get her thyroid checked. Please advise   CB# [phone]

## 2020-07-24 ENCOUNTER — Encounter: Payer: Self-pay | Admitting: Family Medicine

## 2020-07-25 ENCOUNTER — Other Ambulatory Visit: Payer: Self-pay | Admitting: Podiatry

## 2020-07-25 DIAGNOSIS — M879 Osteonecrosis, unspecified: Secondary | ICD-10-CM

## 2020-07-25 NOTE — Telephone Encounter (Signed)
Call placed to patient. LMTRC.  

## 2020-07-26 ENCOUNTER — Telehealth: Payer: Self-pay | Admitting: Podiatry

## 2020-07-26 ENCOUNTER — Other Ambulatory Visit: Payer: Self-pay | Admitting: *Deleted

## 2020-07-26 DIAGNOSIS — E039 Hypothyroidism, unspecified: Secondary | ICD-10-CM

## 2020-07-26 NOTE — Telephone Encounter (Signed)
Call placed to patient and patient made aware.   Future lab orders placed. 

## 2020-07-26 NOTE — Telephone Encounter (Signed)
Pt tried to return your call and would like for you to call her back. She stated she is available at any time today. Please advise.

## 2020-07-31 ENCOUNTER — Ambulatory Visit
Admission: RE | Admit: 2020-07-31 | Discharge: 2020-07-31 | Disposition: A | Discharge status: Home / Self Care | Payer: Managed Care, Other (non HMO) | Source: Ambulatory Visit | Attending: Family Medicine | Admitting: Family Medicine

## 2020-07-31 ENCOUNTER — Other Ambulatory Visit: Payer: Managed Care, Other (non HMO)

## 2020-07-31 ENCOUNTER — Other Ambulatory Visit: Payer: Self-pay

## 2020-07-31 DIAGNOSIS — M25551 Pain in right hip: Secondary | ICD-10-CM

## 2020-07-31 DIAGNOSIS — E039 Hypothyroidism, unspecified: Secondary | ICD-10-CM

## 2020-08-01 ENCOUNTER — Encounter: Payer: Self-pay | Admitting: Family Medicine

## 2020-08-01 ENCOUNTER — Encounter: Payer: Self-pay | Admitting: Podiatry

## 2020-08-01 ENCOUNTER — Ambulatory Visit: Payer: Managed Care, Other (non HMO) | Admitting: Podiatry

## 2020-08-01 ENCOUNTER — Other Ambulatory Visit: Payer: Self-pay | Admitting: *Deleted

## 2020-08-01 DIAGNOSIS — M7751 Other enthesopathy of right foot: Secondary | ICD-10-CM | POA: Diagnosis not present

## 2020-08-01 DIAGNOSIS — M879 Osteonecrosis, unspecified: Secondary | ICD-10-CM

## 2020-08-01 DIAGNOSIS — E039 Hypothyroidism, unspecified: Secondary | ICD-10-CM

## 2020-08-01 DIAGNOSIS — S93401A Sprain of unspecified ligament of right ankle, initial encounter: Secondary | ICD-10-CM

## 2020-08-01 LAB — T4, FREE: Free T4: 1.1 ng/dL (ref 0.8–1.8)

## 2020-08-01 LAB — T3, FREE: T3, Free: 2.3 pg/mL (ref 2.3–4.2)

## 2020-08-01 LAB — TSH: TSH: 30.54 mIU/L — ABNORMAL HIGH (ref 0.40–4.50)

## 2020-08-06 ENCOUNTER — Encounter: Payer: Self-pay | Admitting: Family Medicine

## 2020-08-07 ENCOUNTER — Encounter: Payer: Self-pay | Admitting: Podiatry

## 2020-08-07 NOTE — Progress Notes (Signed)
Subjective:  Patient ID: Donna [name], female    DOB: Oct 23, 1960,  MRN: [id number]  Chief Complaint  Patient presents with  . Nail Problem    Nail trim     60 y.o. female presents with the above complaint.  Patient presents with follow-up of her right foot and ankle pain.  Patient states that the pain is about the same she started to work with it.  She states that she is here to go over the MRI results.   Review of Systems: Negative except as noted in the HPI. Denies N/V/F/Ch.  Past Medical History:  Diagnosis Date  . Anxiety   . Arterial thrombosis (HCC)    right sfa, popliteal artery.  Require thromectomy at [facility] and lifelong anticoagulation.  . Cancer ([place])    ovarian, recurrent on chemo (carboplatin, doxorubicin) at [facility]  . Colon polyp   . Depression   . Full dentures   . GERD (gastroesophageal reflux disease)   . HLD (hyperlipidemia) 12/03/2012  . Hypertension   . Hypothyroidism   . IBS (irritable bowel syndrome)   . Neck mass   . Neuropathy    feet  . Rheumatoid arthritis ([place])    at [facility]  . Schatzki's ring   . Wears glasses     Current Outpatient Medications:  .  amLODipine (NORVASC) 10 MG tablet, Take 1 tablet by mouth once daily, Disp: 90 tablet, Rfl: 0 .  atorvastatin (LIPITOR) 40 MG tablet, Take 2 tablets by mouth once daily, Disp: 180 tablet, Rfl: 0 .  Cholecalciferol (VITAMIN D3) 50 MCG (2000 UT) capsule, , Disp: , Rfl:  .  dexamethasone (DECADRON) 4 MG tablet, Take 2 tablets (8 mg) by mouth daily with breakfast for 3 days after chemotherapy each cycle.  Do not take prednisone on the days you take dexamethasone., Disp: , Rfl:  .  diphenhydrAMINE (BENADRYL) 25 MG tablet, Take 25 mg by mouth at bedtime as needed. For sleep, Disp: , Rfl:  .  enoxaparin (LOVENOX) 120 MG/0.8ML injection, Inject 0.8 mLs (120 mg total) into the skin daily., Disp: 10 mL, Rfl: 0 .  escitalopram (LEXAPRO) 10 MG tablet, Take 1 tablet (10 mg total) by mouth daily. (Patient not taking:  Reported on 07/17/2020), Disp: 30 tablet, Rfl: 5 .  furosemide (LASIX) 20 MG tablet, Take 1 tablet (20 mg total) by mouth daily., Disp: 30 tablet, Rfl: 3 .  gabapentin (NEURONTIN) 300 MG capsule, Take 1 capsule (300 mg total) by mouth 2 (two) times daily., Disp: 60 capsule, Rfl: 11 .  leflunomide (ARAVA) 20 MG tablet, Take 20 mg by mouth daily., Disp: , Rfl:  .  levothyroxine (SYNTHROID) 175 MCG tablet, TAKE 1 TABLET BY MOUTH ONCE DAILY BEFORE BREAKFAST **DISCONTINUE  150  MCG  LEVOTHYROXINE**, Disp: 90 tablet, Rfl: 0 .  losartan (COZAAR) 50 MG tablet, Take 1 tablet by mouth once daily, Disp: 90 tablet, Rfl: 0 .  neomycin-colistin-hydrocortisone-thonzonium (CORTISPORIN-TC) 3.07-19-08-0.5 MG/ML OTIC suspension, Place 4 drops into the right ear 4 (four) times daily. (Patient not taking: Reported on 07/17/2020), Disp: 10 mL, Rfl: 0 .  nitrofurantoin, macrocrystal-monohydrate, (MACROBID) 100 MG capsule, Take 100 mg by mouth 2 (two) times daily., Disp: , Rfl:  .  OLANZapine (ZYPREXA) 5 MG tablet, Take 1 tablet by mouth nightly for 4 nights, starting on the day of chemotherapy each cycle., Disp: , Rfl:  .  omeprazole (PRILOSEC) 40 MG capsule, Take 1 capsule (40 mg total) by mouth in the morning and at bedtime., Disp: 60  capsule, Rfl: 3 .  ondansetron (ZOFRAN) 8 MG tablet, Take by mouth., Disp: , Rfl:  .  potassium chloride SA (KLOR-CON) 20 MEQ tablet, Take 1 tablet by mouth once daily (Patient not taking: Reported on 07/17/2020), Disp: 90 tablet, Rfl: 0 .  potassium chloride SA (KLOR-CON) 20 MEQ tablet, Take 2 tablets (40 mEq total) by mouth daily., Disp: 180 tablet, Rfl: 2 .  predniSONE (DELTASONE) 20 MG tablet, 3 tabs poqday 1-2, 2 tabs poqday 3-4, 1 tab poqday 5-6, Disp: 12 tablet, Rfl: 0 .  prochlorperazine (COMPAZINE) 10 MG tablet, Take 1 tablet (10 mg) by mouth every six hours as needed for nausea if not controlled with other nausea medicines., Disp: , Rfl:  .  vitamin B-12 (CYANOCOBALAMIN) 1000 MCG tablet,  Take 1,000 mcg by mouth daily., Disp: , Rfl:  .  warfarin (COUMADIN) 5 MG tablet, TAKE 1 & 1/2 (ONE & ONE-HALF) TABLETS BY MOUTH ONCE DAILY ON TUE, THURS, SAT, & SUN. THEN TAKE 2 TABLETS BY MOUTH ONCE DAILY ON OTHER DAYS, Disp: 135 tablet, Rfl: 2  Social History   Tobacco Use  Smoking Status Former Smoker  . Years: 32.00  . Quit date: 06/19/2011  . Years since quitting: 9.1  Smokeless Tobacco Never Used    Allergies  Allergen Reactions  . Paclitaxel Other (See Comments)    Flushing, Itching, Throat tightness   Objective:  There were no vitals filed for this visit. There is no height or weight on file to calculate BMI. Constitutional Well developed. Well nourished.  Vascular Dorsalis pedis pulses palpable bilaterally. Posterior tibial pulses palpable bilaterally. Capillary refill normal to all digits.  No cyanosis or clubbing noted. Pedal hair growth normal.  Neurologic Normal speech. Oriented to person, place, and time. Epicritic sensation to light touch grossly present bilaterally.  Dermatologic Nails well groomed and normal in appearance. No open wounds. No skin lesions.  Orthopedic:  Pain on palpation right lateral ankle gutter.  Pain with range of motion of the ankle joint.  Some deep intra-articular pain noted.  Pain with plantarflexion inversion of the foot as well.  Pain at the ATFL ligament as well.  No pain at the sinus tarsi.  Pain circumferential around the ankle as well as the foot  Tender to palpation at the calcaneal tuber right. No pain with calcaneal squeeze right. Ankle ROM diminished range of motion right. Silfverskiold Test: positive right.   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  Midfoot arthritis noted.  No other bony abnormalities identified.  Plantar and posterior spurring noted.  Assessment:   1. 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 Moderate ankle sprain, right, initial encounter   3.  Capsulitis of ankle, right    Plan:  Patient was evaluated and treated and all questions answered.  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  I discussed with her in extensive detail about the bone in fact and various medication as well as systemic disease that could possibly cause it however usually not to the extent that is identified here.  It appears that patient may also have a further involvement into the tibia as well as the hip as well.  I discussed with the patient in extensive detail that she will benefit from a bone scan which was ordered however she has not been able to schedule or has not been reached out to schedule an appointment.  I  will reattempt to schedule a bone scan for her as well. -This also involves outside of my scope of practice as well I believe patient will benefit from orthopedic referral for further evaluation and management. -She also reached out to her oncology doctor which said that usually chemo medication does not result in multiple bone infracts as well. -I will continue to follow up with her until she has had proper care.  Plantar Fasciitis, right -Clinically improving however still continues to have pain.  She has been wearing her braces.  We will continue to wear the brace.   No follow-ups on file.  No follow-ups on file.

## 2020-08-08 ENCOUNTER — Ambulatory Visit: Payer: Managed Care, Other (non HMO) | Admitting: Family Medicine

## 2020-08-09 ENCOUNTER — Encounter: Payer: Self-pay | Admitting: Orthopedic Surgery

## 2020-08-09 ENCOUNTER — Ambulatory Visit: Payer: Managed Care, Other (non HMO) | Admitting: Orthopedic Surgery

## 2020-08-09 DIAGNOSIS — M87161 Osteonecrosis due to drugs, right tibia: Secondary | ICD-10-CM | POA: Diagnosis not present

## 2020-08-09 DIAGNOSIS — M87076 Idiopathic aseptic necrosis of unspecified foot: Secondary | ICD-10-CM | POA: Diagnosis not present

## 2020-08-09 DIAGNOSIS — M25551 Pain in right hip: Secondary | ICD-10-CM

## 2020-08-09 DIAGNOSIS — T380X5A Adverse effect of glucocorticoids and synthetic analogues, initial encounter: Secondary | ICD-10-CM | POA: Diagnosis not present

## 2020-08-09 NOTE — Progress Notes (Signed)
Office Visit Note   Patient: Donna [name]           Date of Birth: [birth year]           MRN: [id number] Visit Date: 08/09/2020              Requested by: [address], [place] [address] [place],  [place] [address] PCP: Susy Frizzle, MD  Chief Complaint  Patient presents with  . Right Ankle - Pain  . Right Foot - Pain  . 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  Patient was diagnosed with ovarian cancer in 2016 and has undergone chemotherapy with her last treatment March 11 of this year.  She states she has had 8 treatments and has undergone a hysterectomy in 2016.  Patient also had a popliteal arterial clot in April 2020 about 2 years ago and had a stent placed at [facility].  Patient is also undergoing disease modifying treatment as well as steroid treatment for her rheumatoid arthritis.  Patient has been on prednisone as well as Decadron.  Patient states she is a former smoker and quit in 2013.  Patient complains of leg cramps and fatigue with ambulation primarily in the right lower extremity she states she has right buttocks pain that was treated with a tapered prednisone Dosepak for possible sciatic symptoms.  Patient states she has to lean over a grocery cart for shopping to unload the leg.  Assessment & Plan: Visit Diagnoses:  1. 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 Pain in right hip   3. 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  Patient has diminished dorsalis pedis circulation on the right.  Will reevaluate in 2 months.  Recommended a stiff soled hiking sneaker or Trail running sneaker to unload the midfoot.  Discussed that her hip pain could also be  from avascular necrosis and we will follow this as well.  Follow-Up Instructions: Return in about 2 months (around 10/09/2020).   Ortho Exam  Patient is alert, oriented, no adenopathy, well-dressed, normal affect, normal respiratory effort. On examination patient does have an antalgic gait but no specific abductor lurch.  She has symmetric internal and external rotation of both hips and this does not significantly increase her hip pain.  She has a negative straight leg raise bilaterally.  No focal motor weakness.  Review of the radiographs of the right hip does show periarticular cystic changes joint space collapse consistent with arthritis however patient may have early avascular necrosis that is not evident on the x-ray.  I cannot palpate a dorsalis pedis pulse on the right.  The Doppler was used and she does have a dampened pulse on the right dorsalis pedis she has a better anterior tibial pulse on the right.  She has a strong posterior tibial triphasic pulse on the right.  On the left patient has a strong dorsalis pedis triphasic pulse on the left her pulse on the right is significantly diminished from the pulse on the left  I have reviewed her MRI scan which shows extensive avascular necrosis of the distal tibia that also involves the bones of the  hindfoot.   Imaging: No results found. No images are attached to the encounter.  Labs: Lab Results  Component Value Date   HGBA1C 5.3 11/04/2017   ESRSEDRATE 45 (H) 05/01/2020   ESRSEDRATE 25 07/27/2018   CRP 9.4 (H) 05/01/2020   LABURIC 7.6 (H) 06/10/2018     Lab Results  Component Value Date   ALBUMIN 4.1 06/30/2016   ALBUMIN 4.2 08/15/2014   ALBUMIN 4.0 05/04/2014    No results found for: MG No results found for: VD25OH  No results found for: PREALBUMIN CBC EXTENDED Latest Ref Rng & Units 05/15/2020 05/01/2020 09/02/2019  WBC 3.8 - 10.8 Thousand/uL 4.9 4.3 7.0  RBC 3.80 - 5.10 Million/uL 2.90(L) 2.54(L) 3.92  HGB 11.7 - 15.5  g/dL 9.5(L) 7.9(L) 12.2  HCT 35.0 - 45.0 % 28.2(L) 23.2(L) 35.8  PLT 140 - 400 Thousand/uL 174 118(L) 286  NEUTROABS 1,500 - 7,800 cells/uL 2,925 2,051 4,893  LYMPHSABS 850 - 3,900 cells/uL 1,455 1,737 1,498     There is no height or weight on file to calculate BMI.  Orders:  No orders of the defined types were placed in this encounter.  No orders of the defined types were placed in this encounter.    Procedures: No procedures performed  Clinical Data: No additional findings.  ROS:  All other systems negative, except as noted in the HPI. Review of Systems  Objective: Vital Signs: There were no vitals taken for this visit.  Specialty Comments:  No specialty comments available.  PMFS History: Patient Active Problem List   Diagnosis Date Noted  . Rheumatoid arthritis ([place])   . Arterial thrombosis ([place])   . Nodule of vagina 02/03/2017  . Pulmonary nodule 02/03/2017  . Primary malignant neoplasm of ovary ([place]) 12/07/2014  . Hypothyroidism 12/03/2012  . HLD (hyperlipidemia) 12/03/2012  . OVERWEIGHT 06/22/2007  . NICOTINE ADDICTION 06/22/2007  . DEPRESSION 06/22/2007  . Essential hypertension 06/22/2007  . GERD 06/22/2007  . OSTEOARTHRITIS 06/22/2007   Past Medical History:  Diagnosis Date  . Anxiety   . Arterial thrombosis (HCC)    right sfa, popliteal artery.  Require thromectomy at [facility] and lifelong anticoagulation.  . Cancer ([place])    ovarian, recurrent on chemo (carboplatin, doxorubicin) at [facility]  . Colon polyp   . Depression   . Full dentures   . GERD (gastroesophageal reflux disease)   . HLD (hyperlipidemia) 12/03/2012  . Hypertension   . Hypothyroidism   . IBS (irritable bowel syndrome)   . Neck mass   . Neuropathy    feet  . Rheumatoid arthritis ([place])    at [facility]  . Schatzki's ring   . Wears glasses     Family History  Problem Relation Age of Onset  . Heart attack Mother   . Hypertension Mother   . Diabetes Mother   . Heart disease Mother    . CVA Father   . Hypertension Father   . Diabetes Father   . Stroke Father   . Cancer Brother        lung cancer - stage 4  . Cancer Brother        skin   . Heart disease Maternal Grandfather   . Heart attack Sister        CABG  . Diabetes Sister   . Stroke Sister   . Hypertension Sister   . Hyperlipidemia Sister   . Kidney disease Sister   . Stroke Sister   . Heart attack Sister  Past Surgical History:  Procedure Laterality Date  . ABDOMINAL HYSTERECTOMY    . ABLATION    . COLONOSCOPY    . CYST REMOVAL NECK  05/13/12  . EAR CYST EXCISION  05/13/2012   Procedure: CYST REMOVAL;  Surgeon: Ralene Ok, MD;  Location: [place];  Service: General;  Laterality: Left;  excision of neck cyst  . THYROID SURGERY  10/2011 - approximate   ablation  . TONSILLECTOMY    . UPPER GI ENDOSCOPY     Social History   Occupational History  . Not on file  Tobacco Use  . Smoking status: Former Smoker    Years: 32.00    Quit date: 06/19/2011    Years since quitting: 9.1  . Smokeless tobacco: Never Used  Vaping Use  . Vaping Use: Never used  Substance and Sexual Activity  . Alcohol use: No  . Drug use: No  . Sexual activity: Yes

## 2020-08-13 ENCOUNTER — Other Ambulatory Visit: Payer: Self-pay

## 2020-08-13 DIAGNOSIS — R0989 Other specified symptoms and signs involving the circulatory and respiratory systems: Secondary | ICD-10-CM

## 2020-08-14 ENCOUNTER — Encounter: Payer: Self-pay | Admitting: Family Medicine

## 2020-08-14 ENCOUNTER — Ambulatory Visit: Payer: Managed Care, Other (non HMO) | Admitting: Family Medicine

## 2020-08-14 ENCOUNTER — Encounter: Payer: Self-pay | Admitting: Vascular Surgery

## 2020-08-14 ENCOUNTER — Ambulatory Visit (INDEPENDENT_AMBULATORY_CARE_PROVIDER_SITE_OTHER)
Admission: RE | Admit: 2020-08-14 | Discharge: 2020-08-14 | Disposition: A | Discharge status: Home / Self Care | Payer: Managed Care, Other (non HMO) | Source: Ambulatory Visit | Attending: Vascular Surgery | Admitting: Vascular Surgery

## 2020-08-14 ENCOUNTER — Other Ambulatory Visit: Payer: Self-pay

## 2020-08-14 ENCOUNTER — Ambulatory Visit (INDEPENDENT_AMBULATORY_CARE_PROVIDER_SITE_OTHER): Payer: Managed Care, Other (non HMO) | Admitting: Family Medicine

## 2020-08-14 ENCOUNTER — Ambulatory Visit: Payer: Managed Care, Other (non HMO) | Admitting: Vascular Surgery

## 2020-08-14 VITALS — BP 112/62 | HR 72 | Temp 99.1°F | Resp 14 | Ht 67.0 in | Wt 243.0 lb

## 2020-08-14 VITALS — BP 115/75 | HR 61 | Temp 98.1°F | Resp 20 | Ht 67.0 in | Wt 243.0 lb

## 2020-08-14 DIAGNOSIS — E876 Hypokalemia: Secondary | ICD-10-CM | POA: Diagnosis not present

## 2020-08-14 DIAGNOSIS — R0989 Other specified symptoms and signs involving the circulatory and respiratory systems: Secondary | ICD-10-CM | POA: Insufficient documentation

## 2020-08-14 DIAGNOSIS — R739 Hyperglycemia, unspecified: Secondary | ICD-10-CM | POA: Diagnosis not present

## 2020-08-14 DIAGNOSIS — Z9889 Other specified postprocedural states: Secondary | ICD-10-CM | POA: Diagnosis not present

## 2020-08-14 NOTE — Progress Notes (Signed)
VASCULAR AND VEIN SPECIALISTS OF [place]  ASSESSMENT / PLAN: 60 y.o. female with history of femoropopliteal thrombectomy, infrarenal aortic stenting for aortic thrombus and popliteal occlusion in April 2020.  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  She has a pulse deficit in her right foot which was not present in postoperative evaluation at [facility].  Her ABIs are reassuring.  We will plan to get a CTA of the abdomen pelvis with lower extremity runoff to evaluate the prior revascularization.  Continue best medical therapy for peripheral arterial disease (ASA, Statin). Follow up with me in a month.  CHIEF COMPLAINT: right foot pain  HISTORY OF PRESENT ILLNESS: Donna [name] is a 60 y.o. female to clinic for evaluation of a right foot pulse deficit identified by Dr. Meridee Score.  She has a complex past medical history.  In 2016 she was diagnosis with ovarian cancer.  She is undergone surgery and chemotherapy for this and is doing well overall.  She was diagnosed with peripheral neuropathy before her cancer diagnosis.  She also suffers from rheumatoid arthritis.  In April 2020 she presented to her oncologist office and reported right foot pain.  No pulse was identified in the right foot.  She was referred to the ER and underwent an urgent thromboembolectomy and aortic stent by Dr. Lovenia Shuck at [facility].  She had a good result and restoration of a palpable pulse in her foot.  She was seen in close interval follow-up and discharged from the vascular practice in late 2020.  In the last several months she has noted worsening pain in her right foot.  She has undergone radiologic evaluation which is revealed avascular necrosis of the right foot.  She saw Dr. Meridee Score who noted no pulse in her right foot, and recommended she be seen by our group.  The patient reports no symptoms of intermittent claudication.  She does not have  ischemic rest pain.  She has no ulceration about her foot.  Reports her activity has been limited by her pain in her right foot  Past Medical History:  Diagnosis Date  . Anxiety   . Arterial thrombosis (HCC)    right sfa, popliteal artery.  Require thromectomy at [facility] and lifelong anticoagulation.  . Cancer ([place])    ovarian, recurrent on chemo (carboplatin, doxorubicin) at [facility]  . Colon polyp   . Depression   . Full dentures   . GERD (gastroesophageal reflux disease)   . HLD (hyperlipidemia) 12/03/2012  . Hypertension   . Hypothyroidism   . IBS (irritable bowel syndrome)   . Neck mass   . Neuropathy    feet  . Rheumatoid arthritis ([place])    at [facility]  . Schatzki's ring   . Wears glasses     Past Surgical History:  Procedure Laterality Date  . ABDOMINAL HYSTERECTOMY    . ABLATION    . COLONOSCOPY    . CYST REMOVAL NECK  05/13/12  . EAR CYST EXCISION  05/13/2012   Procedure: CYST REMOVAL;  Surgeon: Ralene Ok, MD;  Location: [place];  Service: General;  Laterality: Left;  excision of neck cyst  . THYROID SURGERY  10/2011 - approximate   ablation  . TONSILLECTOMY    . UPPER GI ENDOSCOPY      Family History  Problem Relation Age of Onset  . Heart attack Mother   . Hypertension Mother   . Diabetes  Mother   . Heart disease Mother   . CVA Father   . Hypertension Father   . Diabetes Father   . Stroke Father   . Cancer Brother        lung cancer - stage 4  . Cancer Brother        skin   . Heart disease Maternal Grandfather   . Heart attack Sister        CABG  . Diabetes Sister   . Stroke Sister   . Hypertension Sister   . Hyperlipidemia Sister   . Kidney disease Sister   . Stroke Sister   . Heart attack Sister     Social History   Socioeconomic History  . Marital status: Single    Spouse name: Not on file  . Number of children: Not on file  . Years of education: Not on file  . Highest education level: Not on file  Occupational History  . Not on file   Tobacco Use  . Smoking status: Former Smoker    Years: 32.00    Quit date: 06/19/2011    Years since quitting: 9.1  . Smokeless tobacco: Never Used  Vaping Use  . Vaping Use: Never used  Substance and Sexual Activity  . Alcohol use: No  . Drug use: No  . Sexual activity: Yes  Other Topics Concern  . Not on file  Social History Narrative  . Not on file   Social Determinants of Health   Financial Resource Strain: Not on file  Food Insecurity: Not on file  Transportation Needs: Not on file  Physical Activity: Not on file  Stress: Not on file  Social Connections: Not on file  Intimate Partner Violence: Not on file    Allergies  Allergen Reactions  . Paclitaxel Other (See Comments)    Flushing, Itching, Throat tightness    Current Outpatient Medications  Medication Sig Dispense Refill  . amLODipine (NORVASC) 10 MG tablet Take 1 tablet by mouth once daily 90 tablet 0  . atorvastatin (LIPITOR) 40 MG tablet Take 2 tablets by mouth once daily 180 tablet 0  . Cholecalciferol (VITAMIN D3) 50 MCG (2000 UT) capsule     . dexamethasone (DECADRON) 4 MG tablet Take 2 tablets (8 mg) by mouth daily with breakfast for 3 days after chemotherapy each cycle.  Do not take prednisone on the days you take dexamethasone.    . diphenhydrAMINE (BENADRYL) 25 MG tablet Take 25 mg by mouth at bedtime as needed. For sleep    . furosemide (LASIX) 20 MG tablet Take 1 tablet (20 mg total) by mouth daily. 30 tablet 3  . gabapentin (NEURONTIN) 300 MG capsule Take 1 capsule (300 mg total) by mouth 2 (two) times daily. 60 capsule 11  . leflunomide (ARAVA) 20 MG tablet Take 20 mg by mouth daily.    Marland Kitchen levothyroxine (SYNTHROID) 175 MCG tablet TAKE 1 TABLET BY MOUTH ONCE DAILY BEFORE BREAKFAST **DISCONTINUE  150  MCG  LEVOTHYROXINE** 90 tablet 0  . losartan (COZAAR) 50 MG tablet Take 1 tablet by mouth once daily 90 tablet 0  . OLANZapine (ZYPREXA) 5 MG tablet Take 1 tablet by mouth nightly for 4 nights, starting  on the day of chemotherapy each cycle.    Marland Kitchen omeprazole (PRILOSEC) 40 MG capsule Take 1 capsule (40 mg total) by mouth in the morning and at bedtime. 60 capsule 3  . ondansetron (ZOFRAN) 8 MG tablet Take by mouth.    . potassium chloride  SA (KLOR-CON) 20 MEQ tablet Take 2 tablets (40 mEq total) by mouth daily. 180 tablet 2  . prochlorperazine (COMPAZINE) 10 MG tablet Take 1 tablet (10 mg) by mouth every six hours as needed for nausea if not controlled with other nausea medicines.    . vitamin B-12 (CYANOCOBALAMIN) 1000 MCG tablet Take 1,000 mcg by mouth daily.    Marland Kitchen warfarin (COUMADIN) 5 MG tablet TAKE 1 & 1/2 (ONE & ONE-HALF) TABLETS BY MOUTH ONCE DAILY ON TUE, THURS, SAT, & SUN. THEN TAKE 2 TABLETS BY MOUTH ONCE DAILY ON OTHER DAYS 135 tablet 2   No current facility-administered medications for this visit.    REVIEW OF SYSTEMS:  [X]  denotes positive finding, [ ]  denotes negative finding Cardiac  Comments:  Chest pain or chest pressure:    Shortness of breath upon exertion:    Short of breath when lying flat:    Irregular heart rhythm:        Vascular    Pain in calf, thigh, or hip brought on by ambulation:    Pain in feet at night that wakes you up from your sleep:     Blood clot in your veins:    Leg swelling:         Pulmonary    Oxygen at home:    Productive cough:     Wheezing:         Neurologic    Sudden weakness in arms or legs:     Sudden numbness in arms or legs:     Sudden onset of difficulty speaking or slurred speech:    Temporary loss of vision in one eye:     Problems with dizziness:         Gastrointestinal    Blood in stool:     Vomited blood:         Genitourinary    Burning when urinating:     Blood in urine:        Psychiatric    Major depression:         Hematologic    Bleeding problems:    Problems with blood clotting too easily:        Skin    Rashes or ulcers:        Constitutional    Fever or chills:      PHYSICAL EXAM  Vitals:    08/14/20 0830  BP: 115/75  Pulse: 61  Resp: 20  Temp: 98.1 F (36.7 C)  SpO2: 100%  Weight: 243 lb (110.2 kg)  Height: 5\' 7"  (1.702 m)    Constitutional: well appearing. no distress. Appears well nourished.  Neurologic: CN intact. no focal findings. no sensory loss. Psychiatric: Mood and affect symmetric and appropriate. Eyes: No icterus. No conjunctival pallor. Ears, nose, throat: mucous membranes moist. Midline trachea.  Cardiac: regular rate and rhythm.  Respiratory: unlabored. Abdominal: soft, non-tender, non-distended.  Peripheral vascular:  2+ L femoral pulse  1+ R femoral pulse  2+ L DP / PT  Absent R DP / PT Extremity: No edema. No cyanosis. No pallor.  Skin: No gangrene. No ulceration.  Lymphatic: No Stemmer's sign. No palpable lymphadenopathy.  PERTINENT LABORATORY AND RADIOLOGIC DATA  Most recent CBC CBC Latest Ref Rng & Units 05/15/2020 05/01/2020 09/02/2019  WBC 3.8 - 10.8 Thousand/uL 4.9 4.3 7.0  Hemoglobin 11.7 - 15.5 g/dL 9.5(L) 7.9(L) 12.2  Hematocrit 35.0 - 45.0 % 28.2(L) 23.2(L) 35.8  Platelets 140 - 400 Thousand/uL 174 118(L) 286  Most recent CMP CMP Latest Ref Rng & Units 07/17/2020 05/15/2020 05/01/2020  Glucose 65 - 99 mg/dL 89 87 84  BUN 7 - 25 mg/dL 12 7 7   Creatinine 0.50 - 0.99 mg/dL 0.71 0.66 0.63  Sodium 135 - 146 mmol/L 141 142 140  Potassium 3.5 - 5.3 mmol/L 3.4(L) 3.7 3.2(L)  Chloride 98 - 110 mmol/L 101 106 100  CO2 20 - 32 mmol/L 28 28 27   Calcium 8.6 - 10.4 mg/dL 7.9(L) 8.4(L) 7.6(L)  Total Protein 6.1 - 8.1 g/dL 6.1 6.0(L) -  Total Bilirubin 0.2 - 1.2 mg/dL 0.4 0.3 -  Alkaline Phos 33 - 130 U/L - - -  AST 10 - 35 U/L 26 17 -  ALT 6 - 29 U/L 15 11 -    Renal function CrCl cannot be calculated (Patient's most recent lab result is older than the maximum 21 days allowed.).  Hgb A1c MFr Bld (% of total Hgb)  Date Value  11/04/2017 5.3    Ldl Cholesterol, Calc  Date Value Ref Range Status  06/18/2011 128 (H) 0 - 100 mg/dL  Final   LDL Cholesterol (Calc)  Date Value Ref Range Status  09/02/2019 108 (H) mg/dL (calc) Final    Comment:    Reference range: <100 . Desirable range <100 mg/dL for primary prevention;   <70 mg/dL for patients with CHD or diabetic patients  with > or = 2 CHD risk factors. Marland Kitchen LDL-C is now calculated using the Martin-Hopkins  calculation, which is a validated novel method providing  better accuracy than the Friedewald equation in the  estimation of LDL-C.  Cresenciano Genre et al. Annamaria Helling. 6834;196(22): 2061-2068  (http://education.[employer].com/faq/FAQ164)    Direct LDL  Date Value Ref Range Status  06/30/2016 113 <130 mg/dL Final    Comment:      Desirable range <100 mg/dL for patients with CHD or diabetes and <70 mg/dL for diabetic patients with known heart disease.        Vascular Imaging:   Yevonne Aline. Stanford Breed, MD [facility] Vein Special[facility] of [facility] Phone Number: [phone] 08/14/2020 9:37 AM

## 2020-08-14 NOTE — Progress Notes (Signed)
Subjective:    Patient ID: Donna [name], female    DOB: 1960/08/07, 60 y.o.   MRN: [id number]  Patient recently called me with hypokalemia.  Her potassium was less than 3 despite taking 40 mEq a day of potassium.  We increased her potassium to 60 mEq a day for the last 6 days and she is here today to recheck her potassium level.  We have also not checked her magnesium level which could complicate her hypokalemia.  She is taking Lasix for swelling but she does not feel that is helping and therefore I recommended that we stop this.  She has noticed some hyperglycemia related to her steroid use with her chemotherapy.  However she does have a history of diabetes and her family and therefore would like to check an A1c.  Otherwise she is doing relatively well. Past Medical History:  Diagnosis Date  . Anxiety   . Arterial thrombosis (HCC)    right sfa, popliteal artery.  Require thromectomy at [facility] and lifelong anticoagulation.  . Cancer ([place])    ovarian, recurrent on chemo (carboplatin, doxorubicin) at [facility]  . Colon polyp   . Depression   . Full dentures   . GERD (gastroesophageal reflux disease)   . HLD (hyperlipidemia) 12/03/2012  . Hypertension   . Hypothyroidism   . IBS (irritable bowel syndrome)   . Neck mass   . Neuropathy    feet  . Rheumatoid arthritis ([place])    at [facility]  . Schatzki's ring   . Wears glasses    Past Surgical History:  Procedure Laterality Date  . ABDOMINAL HYSTERECTOMY    . ABLATION    . COLONOSCOPY    . CYST REMOVAL NECK  05/13/12  . EAR CYST EXCISION  05/13/2012   Procedure: CYST REMOVAL;  Surgeon: Ralene Ok, MD;  Location: [place];  Service: General;  Laterality: Left;  excision of neck cyst  . THYROID SURGERY  10/2011 - approximate   ablation  . TONSILLECTOMY    . UPPER GI ENDOSCOPY     Current Outpatient Medications on File Prior to Visit  Medication Sig Dispense Refill  . amLODipine (NORVASC) 10 MG tablet Take 1 tablet by mouth once daily 90  tablet 0  . atorvastatin (LIPITOR) 40 MG tablet Take 2 tablets by mouth once daily 180 tablet 0  . Cholecalciferol (VITAMIN D3) 50 MCG (2000 UT) capsule     . dexamethasone (DECADRON) 4 MG tablet Take 2 tablets (8 mg) by mouth daily with breakfast for 3 days after chemotherapy each cycle.  Do not take prednisone on the days you take dexamethasone.    . diphenhydrAMINE (BENADRYL) 25 MG tablet Take 25 mg by mouth at bedtime as needed. For sleep    . furosemide (LASIX) 20 MG tablet Take 1 tablet (20 mg total) by mouth daily. 30 tablet 3  . gabapentin (NEURONTIN) 300 MG capsule Take 1 capsule (300 mg total) by mouth 2 (two) times daily. 60 capsule 11  . leflunomide (ARAVA) 20 MG tablet Take 20 mg by mouth daily.    Marland Kitchen levothyroxine (SYNTHROID) 175 MCG tablet TAKE 1 TABLET BY MOUTH ONCE DAILY BEFORE BREAKFAST **DISCONTINUE  150  MCG  LEVOTHYROXINE** 90 tablet 0  . losartan (COZAAR) 50 MG tablet Take 1 tablet by mouth once daily 90 tablet 0  . OLANZapine (ZYPREXA) 5 MG tablet Take 1 tablet by mouth nightly for 4 nights, starting on the day of chemotherapy each cycle.    Marland Kitchen omeprazole (  PRILOSEC) 40 MG capsule Take 1 capsule (40 mg total) by mouth in the morning and at bedtime. 60 capsule 3  . ondansetron (ZOFRAN) 8 MG tablet Take by mouth.    . potassium chloride SA (KLOR-CON) 20 MEQ tablet Take 2 tablets (40 mEq total) by mouth daily. 180 tablet 2  . prochlorperazine (COMPAZINE) 10 MG tablet Take 1 tablet (10 mg) by mouth every six hours as needed for nausea if not controlled with other nausea medicines.    . vitamin B-12 (CYANOCOBALAMIN) 1000 MCG tablet Take 1,000 mcg by mouth daily.    Marland Kitchen warfarin (COUMADIN) 5 MG tablet TAKE 1 & 1/2 (ONE & ONE-HALF) TABLETS BY MOUTH ONCE DAILY ON TUE, THURS, SAT, & SUN. THEN TAKE 2 TABLETS BY MOUTH ONCE DAILY ON OTHER DAYS 135 tablet 2   No current facility-administered medications on file prior to visit.   Allergies  Allergen Reactions  . Paclitaxel Other (See  Comments)    Flushing, Itching, Throat tightness   Social History   Socioeconomic History  . Marital status: Single    Spouse name: Not on file  . Number of children: Not on file  . Years of education: Not on file  . Highest education level: Not on file  Occupational History  . Not on file  Tobacco Use  . Smoking status: Former Smoker    Years: 32.00    Quit date: 06/19/2011    Years since quitting: 9.1  . Smokeless tobacco: Never Used  Vaping Use  . Vaping Use: Never used  Substance and Sexual Activity  . Alcohol use: No  . Drug use: No  . Sexual activity: Yes  Other Topics Concern  . Not on file  Social History Narrative  . Not on file   Social Determinants of Health   Financial Resource Strain: Not on file  Food Insecurity: Not on file  Transportation Needs: Not on file  Physical Activity: Not on file  Stress: Not on file  Social Connections: Not on file  Intimate Partner Violence: Not on file      Review of Systems  All other systems reviewed and are negative.      Objective:   Physical Exam Vitals reviewed.  Constitutional:      Appearance: She is normal weight.  Cardiovascular:     Rate and Rhythm: Normal rate and regular rhythm.     Heart sounds: Normal heart sounds.  Pulmonary:     Effort: Pulmonary effort is normal.     Breath sounds: Normal breath sounds.  Musculoskeletal:     Right lower leg: No edema.     Left lower leg: No edema.  Neurological:     Mental Status: She is alert.           Assessment & Plan:  Hypokalemia - Plan: BASIC METABOLIC PANEL WITH GFR, Magnesium, BASIC METABOLIC PANEL WITH GFR  Hyperglycemia - Plan: Hemoglobin A1c  Stop Lasix.  Recheck potassium and also check magnesium.  Replace magnesium if low.  Otherwise adjust potassium dose to achieve therapeutic potassium levels.  Discontinue Lasix.

## 2020-08-15 ENCOUNTER — Inpatient Hospital Stay (HOSPITAL_COMMUNITY)
Admission: EM | Admit: 2020-08-15 | Discharge: 2020-08-17 | DRG: 812 | Disposition: A | Discharge status: Home / Self Care | Payer: Managed Care, Other (non HMO) | Attending: Internal Medicine | Admitting: Internal Medicine

## 2020-08-15 ENCOUNTER — Telehealth: Payer: Self-pay | Admitting: Family Medicine

## 2020-08-15 ENCOUNTER — Encounter (HOSPITAL_COMMUNITY): Payer: Self-pay

## 2020-08-15 ENCOUNTER — Telehealth: Payer: Self-pay | Admitting: Nurse Practitioner

## 2020-08-15 ENCOUNTER — Other Ambulatory Visit: Payer: Self-pay | Admitting: *Deleted

## 2020-08-15 ENCOUNTER — Emergency Department (HOSPITAL_COMMUNITY): Payer: Managed Care, Other (non HMO)

## 2020-08-15 DIAGNOSIS — Z87891 Personal history of nicotine dependence: Secondary | ICD-10-CM

## 2020-08-15 DIAGNOSIS — E1142 Type 2 diabetes mellitus with diabetic polyneuropathy: Secondary | ICD-10-CM | POA: Diagnosis present

## 2020-08-15 DIAGNOSIS — D696 Thrombocytopenia, unspecified: Secondary | ICD-10-CM | POA: Diagnosis present

## 2020-08-15 DIAGNOSIS — Z7901 Long term (current) use of anticoagulants: Secondary | ICD-10-CM | POA: Diagnosis not present

## 2020-08-15 DIAGNOSIS — Z20822 Contact with and (suspected) exposure to covid-19: Secondary | ICD-10-CM | POA: Diagnosis present

## 2020-08-15 DIAGNOSIS — Z8249 Family history of ischemic heart disease and other diseases of the circulatory system: Secondary | ICD-10-CM

## 2020-08-15 DIAGNOSIS — D6481 Anemia due to antineoplastic chemotherapy: Secondary | ICD-10-CM | POA: Diagnosis present

## 2020-08-15 DIAGNOSIS — K222 Esophageal obstruction: Secondary | ICD-10-CM | POA: Diagnosis present

## 2020-08-15 DIAGNOSIS — R0789 Other chest pain: Secondary | ICD-10-CM | POA: Diagnosis present

## 2020-08-15 DIAGNOSIS — E039 Hypothyroidism, unspecified: Secondary | ICD-10-CM | POA: Diagnosis present

## 2020-08-15 DIAGNOSIS — D649 Anemia, unspecified: Secondary | ICD-10-CM | POA: Diagnosis present

## 2020-08-15 DIAGNOSIS — Z79899 Other long term (current) drug therapy: Secondary | ICD-10-CM

## 2020-08-15 DIAGNOSIS — Z86718 Personal history of other venous thrombosis and embolism: Secondary | ICD-10-CM

## 2020-08-15 DIAGNOSIS — Z90710 Acquired absence of both cervix and uterus: Secondary | ICD-10-CM

## 2020-08-15 DIAGNOSIS — E876 Hypokalemia: Secondary | ICD-10-CM

## 2020-08-15 DIAGNOSIS — Z833 Family history of diabetes mellitus: Secondary | ICD-10-CM

## 2020-08-15 DIAGNOSIS — Z888 Allergy status to other drugs, medicaments and biological substances status: Secondary | ICD-10-CM

## 2020-08-15 DIAGNOSIS — I10 Essential (primary) hypertension: Secondary | ICD-10-CM

## 2020-08-15 DIAGNOSIS — F419 Anxiety disorder, unspecified: Secondary | ICD-10-CM | POA: Diagnosis present

## 2020-08-15 DIAGNOSIS — E785 Hyperlipidemia, unspecified: Secondary | ICD-10-CM | POA: Diagnosis present

## 2020-08-15 DIAGNOSIS — M069 Rheumatoid arthritis, unspecified: Secondary | ICD-10-CM | POA: Diagnosis present

## 2020-08-15 DIAGNOSIS — Z8543 Personal history of malignant neoplasm of ovary: Secondary | ICD-10-CM | POA: Diagnosis not present

## 2020-08-15 DIAGNOSIS — Z9221 Personal history of antineoplastic chemotherapy: Secondary | ICD-10-CM

## 2020-08-15 DIAGNOSIS — K219 Gastro-esophageal reflux disease without esophagitis: Secondary | ICD-10-CM | POA: Diagnosis present

## 2020-08-15 DIAGNOSIS — C569 Malignant neoplasm of unspecified ovary: Secondary | ICD-10-CM | POA: Diagnosis present

## 2020-08-15 DIAGNOSIS — Z79890 Hormone replacement therapy (postmenopausal): Secondary | ICD-10-CM

## 2020-08-15 DIAGNOSIS — T451X5A Adverse effect of antineoplastic and immunosuppressive drugs, initial encounter: Secondary | ICD-10-CM | POA: Diagnosis present

## 2020-08-15 DIAGNOSIS — T501X5A Adverse effect of loop [high-ceiling] diuretics, initial encounter: Secondary | ICD-10-CM | POA: Diagnosis present

## 2020-08-15 LAB — COMPREHENSIVE METABOLIC PANEL
ALT: 19 U/L (ref 0–44)
AST: 22 U/L (ref 15–41)
Albumin: 3.9 g/dL (ref 3.5–5.0)
Alkaline Phosphatase: 70 U/L (ref 38–126)
Anion gap: 10 (ref 5–15)
BUN: 9 mg/dL (ref 8–23)
CO2: 25 mmol/L (ref 22–32)
Calcium: 7.9 mg/dL — ABNORMAL LOW (ref 8.9–10.3)
Chloride: 106 mmol/L (ref 98–111)
Creatinine, Ser: 0.66 mg/dL (ref 0.44–1.00)
GFR, Estimated: >60 mL/min (ref >60)
Glucose, Bld: 103 mg/dL — ABNORMAL HIGH (ref 70–99)
Potassium: 2.9 mmol/L — ABNORMAL LOW (ref 3.5–5.1)
Sodium: 141 mmol/L (ref 135–145)
Total Bilirubin: 0.3 mg/dL (ref 0.3–1.2)
Total Protein: 6.6 g/dL (ref 6.5–8.1)

## 2020-08-15 LAB — BASIC METABOLIC PANEL WITH GFR
BUN: 9 mg/dL (ref 7–25)
CO2: 29 mmol/L (ref 20–32)
Calcium: 8 mg/dL — ABNORMAL LOW (ref 8.6–10.4)
Chloride: 103 mmol/L (ref 98–110)
Creat: 0.61 mg/dL (ref 0.50–0.99)
GFR, Est African American: 113 mL/min/{1.73_m2} (ref >=60)
GFR, Est Non African American: 98 mL/min/{1.73_m2} (ref >=60)
Glucose, Bld: 131 mg/dL — ABNORMAL HIGH (ref 65–99)
Potassium: 3.2 mmol/L — ABNORMAL LOW (ref 3.5–5.3)
Sodium: 143 mmol/L (ref 135–146)

## 2020-08-15 LAB — RESP PANEL BY RT-PCR (FLU A&B, COVID) ARPGX2
Influenza A by PCR: NEGATIVE
Influenza B by PCR: NEGATIVE
SARS Coronavirus 2 by RT PCR: NEGATIVE

## 2020-08-15 LAB — FOLATE: Folate: 7.6 ng/mL (ref >5.9)

## 2020-08-15 LAB — CBC WITH DIFFERENTIAL/PLATELET
Abs Immature Granulocytes: 0.01 10*3/uL (ref 0.00–0.07)
Basophils Absolute: 0 10*3/uL (ref 0.0–0.1)
Basophils Relative: 0 %
Eosinophils Absolute: 0 10*3/uL (ref 0.0–0.5)
Eosinophils Relative: 1 %
HCT: 22.1 % — ABNORMAL LOW (ref 36.0–46.0)
Hemoglobin: 7.4 g/dL — ABNORMAL LOW (ref 12.0–15.0)
Immature Granulocytes: 0 %
Lymphocytes Relative: 42 %
Lymphs Abs: 1.7 10*3/uL (ref 0.7–4.0)
MCH: 35.7 pg — ABNORMAL HIGH (ref 26.0–34.0)
MCHC: 33.5 g/dL (ref 30.0–36.0)
MCV: 106.8 fL — ABNORMAL HIGH (ref 80.0–100.0)
Monocytes Absolute: 0.4 10*3/uL (ref 0.1–1.0)
Monocytes Relative: 10 %
Neutro Abs: 1.9 10*3/uL (ref 1.7–7.7)
Neutrophils Relative %: 47 %
Platelets: 88 10*3/uL — ABNORMAL LOW (ref 150–400)
RBC: 2.07 MIL/uL — ABNORMAL LOW (ref 3.87–5.11)
RDW: 17.2 % — ABNORMAL HIGH (ref 11.5–15.5)
WBC: 4.1 10*3/uL (ref 4.0–10.5)
nRBC: 0.5 % — ABNORMAL HIGH (ref 0.0–0.2)

## 2020-08-15 LAB — RENAL FUNCTION PANEL
Albumin: 3.9 g/dL (ref 3.5–5.0)
Anion gap: 9 (ref 5–15)
BUN: 6 mg/dL — ABNORMAL LOW (ref 8–23)
CO2: 24 mmol/L (ref 22–32)
Calcium: 8.2 mg/dL — ABNORMAL LOW (ref 8.9–10.3)
Chloride: 108 mmol/L (ref 98–111)
Creatinine, Ser: 0.57 mg/dL (ref 0.44–1.00)
GFR, Estimated: >60 mL/min (ref >60)
Glucose, Bld: 140 mg/dL — ABNORMAL HIGH (ref 70–99)
Phosphorus: 2.9 mg/dL (ref 2.5–4.6)
Potassium: 3.2 mmol/L — ABNORMAL LOW (ref 3.5–5.1)
Sodium: 141 mmol/L (ref 135–145)

## 2020-08-15 LAB — TROPONIN I (HIGH SENSITIVITY)
Troponin I (High Sensitivity): 6 ng/L (ref <18)
Troponin I (High Sensitivity): 6 ng/L (ref <18)

## 2020-08-15 LAB — PREPARE RBC (CROSSMATCH)

## 2020-08-15 LAB — HEMOGLOBIN A1C
Hgb A1c MFr Bld: 5.2 % of total Hgb (ref <5.7)
Mean Plasma Glucose: 103 mg/dL
eAG (mmol/L): 5.7 mmol/L

## 2020-08-15 LAB — HEMOGLOBIN AND HEMATOCRIT, BLOOD
HCT: 27.7 % — ABNORMAL LOW (ref 36.0–46.0)
Hemoglobin: 9.2 g/dL — ABNORMAL LOW (ref 12.0–15.0)

## 2020-08-15 LAB — MAGNESIUM
Magnesium: 0.9 mg/dL — CL (ref 1.5–2.5)
Magnesium: 1 mg/dL — ABNORMAL LOW (ref 1.7–2.4)

## 2020-08-15 LAB — HIV ANTIBODY (ROUTINE TESTING W REFLEX): HIV Screen 4th Generation wRfx: NONREACTIVE

## 2020-08-15 LAB — PROTIME-INR
INR: 1.9 — ABNORMAL HIGH (ref 0.8–1.2)
Prothrombin Time: 20.7 seconds — ABNORMAL HIGH (ref 11.4–15.2)

## 2020-08-15 LAB — VITAMIN B12: Vitamin B-12: 730 pg/mL (ref 180–914)

## 2020-08-15 LAB — CK: Total CK: 189 U/L (ref 38–234)

## 2020-08-15 LAB — ABO/RH: ABO/RH(D): A POS

## 2020-08-15 LAB — POC OCCULT BLOOD, ED: Fecal Occult Bld: NEGATIVE

## 2020-08-15 MED ORDER — DIPHENHYDRAMINE HCL 25 MG PO TABS
25.0000 mg | ORAL_TABLET | Freq: Every evening | ORAL | Status: DC | PRN
  Filled 2020-08-15: qty 1

## 2020-08-15 MED ORDER — VITAMIN D 25 MCG (1000 UNIT) PO TABS
2000.0000 [IU] | ORAL_TABLET | Freq: Every day | ORAL | Status: DC
  Administered 2020-08-15 – 2020-08-17 (×3): 2000 [IU] via ORAL
  Filled 2020-08-15 (×3): qty 2

## 2020-08-15 MED ORDER — ATORVASTATIN CALCIUM 40 MG PO TABS
80.0000 mg | ORAL_TABLET | Freq: Every day | ORAL | Status: DC
  Administered 2020-08-15 – 2020-08-17 (×3): 80 mg via ORAL
  Filled 2020-08-15 (×3): qty 2

## 2020-08-15 MED ORDER — ACETAMINOPHEN 650 MG RE SUPP: 650.0000 mg | Freq: Four times a day (QID) | RECTAL | Status: DC | PRN

## 2020-08-15 MED ORDER — SODIUM CHLORIDE 0.9 % IV SOLN
10.0000 mL/h | Freq: Once | INTRAVENOUS | Status: AC
  Administered 2020-08-15: 10 mL/h via INTRAVENOUS

## 2020-08-15 MED ORDER — ACETAMINOPHEN 325 MG PO TABS
650.0000 mg | ORAL_TABLET | Freq: Once | ORAL | Status: AC
  Administered 2020-08-15: 650 mg via ORAL
  Filled 2020-08-15: qty 2

## 2020-08-15 MED ORDER — AMLODIPINE BESYLATE 10 MG PO TABS
10.0000 mg | ORAL_TABLET | Freq: Every day | ORAL | Status: DC
  Administered 2020-08-15 – 2020-08-17 (×3): 10 mg via ORAL
  Filled 2020-08-15 (×3): qty 1

## 2020-08-15 MED ORDER — LEVOTHYROXINE SODIUM 50 MCG PO TABS
175.0000 ug | ORAL_TABLET | Freq: Every day | ORAL | Status: DC
  Administered 2020-08-16 – 2020-08-17 (×2): 175 ug via ORAL
  Filled 2020-08-15 (×2): qty 1

## 2020-08-15 MED ORDER — PANTOPRAZOLE SODIUM 40 MG PO TBEC
40.0000 mg | DELAYED_RELEASE_TABLET | Freq: Every day | ORAL | Status: DC
  Administered 2020-08-15 – 2020-08-17 (×3): 40 mg via ORAL
  Filled 2020-08-15 (×3): qty 1

## 2020-08-15 MED ORDER — WARFARIN SODIUM 5 MG PO TABS: 15.0000 mg | ORAL_TABLET | Freq: Once | ORAL | Status: DC

## 2020-08-15 MED ORDER — ACETAMINOPHEN 325 MG PO TABS
650.0000 mg | ORAL_TABLET | Freq: Four times a day (QID) | ORAL | Status: DC | PRN
  Administered 2020-08-15 – 2020-08-17 (×7): 650 mg via ORAL
  Filled 2020-08-15 (×7): qty 2

## 2020-08-15 MED ORDER — POTASSIUM CHLORIDE 10 MEQ/100ML IV SOLN
10.0000 meq | INTRAVENOUS | Status: AC
  Administered 2020-08-15 (×2): 10 meq via INTRAVENOUS
  Filled 2020-08-15 (×2): qty 100

## 2020-08-15 MED ORDER — LEFLUNOMIDE 20 MG PO TABS
20.0000 mg | ORAL_TABLET | Freq: Every day | ORAL | Status: DC
  Administered 2020-08-15 – 2020-08-17 (×3): 20 mg via ORAL
  Filled 2020-08-15 (×3): qty 1

## 2020-08-15 MED ORDER — POTASSIUM CHLORIDE CRYS ER 20 MEQ PO TBCR
40.0000 meq | EXTENDED_RELEASE_TABLET | Freq: Two times a day (BID) | ORAL | Status: AC
  Administered 2020-08-15 – 2020-08-16 (×3): 40 meq via ORAL
  Filled 2020-08-15 (×3): qty 2

## 2020-08-15 MED ORDER — MAGNESIUM SULFATE 2 GM/50ML IV SOLN
2.0000 g | Freq: Once | INTRAVENOUS | Status: AC
  Administered 2020-08-15: 2 g via INTRAVENOUS
  Filled 2020-08-15: qty 50

## 2020-08-15 MED ORDER — WARFARIN SODIUM 5 MG PO TABS: 12.5000 mg | ORAL_TABLET | Freq: Every day | ORAL | Status: DC

## 2020-08-15 MED ORDER — GABAPENTIN 300 MG PO CAPS
300.0000 mg | ORAL_CAPSULE | Freq: Two times a day (BID) | ORAL | Status: DC
Start: 2020-08-15 — End: 2020-08-17
  Administered 2020-08-15 – 2020-08-17 (×5): 300 mg via ORAL
  Filled 2020-08-15 (×5): qty 1

## 2020-08-15 MED ORDER — VITAMIN B-12 1000 MCG PO TABS
1000.0000 ug | ORAL_TABLET | Freq: Every day | ORAL | Status: DC
  Administered 2020-08-15 – 2020-08-17 (×3): 1000 ug via ORAL
  Filled 2020-08-15 (×3): qty 1

## 2020-08-15 MED ORDER — WARFARIN - PHARMACIST DOSING INPATIENT: Freq: Every day | Status: DC

## 2020-08-15 MED ORDER — WARFARIN - PHYSICIAN DOSING INPATIENT: Freq: Every day | Status: DC

## 2020-08-15 MED ORDER — CALCIUM GLUCONATE-NACL 1-0.675 GM/50ML-% IV SOLN
1.0000 g | Freq: Once | INTRAVENOUS | Status: AC
  Administered 2020-08-15: 1000 mg via INTRAVENOUS
  Filled 2020-08-15: qty 50

## 2020-08-15 MED ORDER — WARFARIN SODIUM 5 MG PO TABS
12.5000 mg | ORAL_TABLET | Freq: Once | ORAL | Status: AC
  Administered 2020-08-15: 12.5 mg via ORAL
  Filled 2020-08-15: qty 2

## 2020-08-15 MED ORDER — LOSARTAN POTASSIUM 50 MG PO TABS
50.0000 mg | ORAL_TABLET | Freq: Every day | ORAL | Status: DC
  Administered 2020-08-15 – 2020-08-17 (×3): 50 mg via ORAL
  Filled 2020-08-15 (×3): qty 1

## 2020-08-15 MED ORDER — CALCIUM CARBONATE ANTACID 500 MG PO CHEW
1.0000 | CHEWABLE_TABLET | Freq: Once | ORAL | Status: AC
  Administered 2020-08-15: 200 mg via ORAL
  Filled 2020-08-15: qty 1

## 2020-08-15 MED ORDER — ONDANSETRON HCL 8 MG PO TABS
8.0000 mg | ORAL_TABLET | Freq: Every day | ORAL | Status: DC | PRN
  Administered 2020-08-16: 8 mg via ORAL
  Filled 2020-08-15: qty 1

## 2020-08-15 NOTE — ED Triage Notes (Signed)
Pt presents from home, states she received a call from her PCP stating her magnesium was low on follow-up blood work from yesterday. Pt reports muscle cramping over the last few days. Hx ovarian CA but not currently receiving treatment

## 2020-08-15 NOTE — Progress Notes (Signed)
ANTICOAGULATION CONSULT NOTE - Initial Consult  Pharmacy Consult for Warfarin  Indication: arterial thrombosis   Allergies  Allergen Reactions  . Paclitaxel Other (See Comments)    Flushing, Itching, Throat tightness    Patient Measurements: Height: 5\' 7"  (170.2 cm) Weight: 110.2 kg (243 lb) IBW/kg (Calculated) : 61.6   Vital Signs: Temp: 97.8 F (36.6 C) (03/30 1304) Temp Source: Oral (03/30 0911) BP: 118/67 (03/30 1304) Pulse Rate: 72 (03/30 1304)  Labs: Recent Labs    08/14/20 1545 08/15/20 0335 08/15/20 0524  HGB  --  7.4*  --   HCT  --  22.1*  --   PLT  --  88*  --   LABPROT  --  20.7*  --   INR  --  1.9*  --   CREATININE 0.61 0.66  --   CKTOTAL  --  189  --   TROPONINIHS  --  6 6    Estimated Creatinine Clearance: 94.4 mL/min (by C-G formula based on SCr of 0.66 mg/dL).   Medical History: Past Medical History:  Diagnosis Date  . Anxiety   . Arterial thrombosis (HCC)    right sfa, popliteal artery.  Require thromectomy at [facility] and lifelong anticoagulation.  . Cancer ([place])    ovarian, recurrent on chemo (carboplatin, doxorubicin) at [facility]  . Colon polyp   . Depression   . Full dentures   . GERD (gastroesophageal reflux disease)   . HLD (hyperlipidemia) 12/03/2012  . Hypertension   . Hypothyroidism   . IBS (irritable bowel syndrome)   . Neck mass   . Neuropathy    feet  . Rheumatoid arthritis ([place])    at [facility]  . Schatzki's ring   . Wears glasses     Medications:  PTA warfarin regimen: 12.5mg  PO daily-last dose reported as 3/29 at 930am   Assessment: 60 y/o F with PMH of ovarian cancer on chemo, GERD, HLD, HTN, arterial thrombosis on chronic warfarin therapy currently admitted due to electrolyte abnormalities. Pharmacy consulted for warfarin dosing while patient in the hospital. Admission INR slightly subtherapeutic at 1.9. Patient denies any missed doses. Hgb low/decreased to 7.4, likely secondary to recent chemo. Given 1 unit PRBCs in ED. FOBT  negative.    Goal of Therapy:  INR 2-3 Monitor platelets by anticoagulation protocol: Yes   Plan:  Given anemia, will continue home dose of warfarin 12.5mg  PO x 1 today rather than giving larger dose Daily PT/INR Monitor CBC closely and for s/sx of bleeding   Lindell Spar M 08/15/2020,2:14 PM

## 2020-08-15 NOTE — ED Notes (Signed)
ED TO INPATIENT HANDOFF REPORT  Name/Age/Gender Donna [name] 60 y.o. female  Code Status   Home/SNF/Other Home  Chief Complaint Symptomatic anemia [D64.9]  Level of Care/Admitting Diagnosis ED Disposition    ED Disposition Condition [facility] Area: [place] [[id number]]  Level of Care: Med-Surg [16]  May admit patient to [facility] or [facility] if equivalent level of care is available:: Yes  Covid Evaluation: Asymptomatic Screening Protocol (No Symptoms)  Diagnosis: Symptomatic anemia [5176160]  Admitting Physician: Eben Burow [[id number]]  Attending Physician: Eben Burow [[id number]]  Estimated length of stay: past midnight tomorrow  Certification:: I certify this patient will need inpatient services for at least 2 midnights       Medical History Past Medical History:  Diagnosis Date  . Anxiety   . 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  . Cancer ([place])    ovarian, recurrent on chemo (carboplatin, doxorubicin) at [facility]  . Colon polyp   . Depression   . Full dentures   . GERD (gastroesophageal reflux disease)   . HLD (hyperlipidemia) 12/03/2012  . Hypertension   . Hypothyroidism   . IBS (irritable bowel syndrome)   . Neck mass   . Neuropathy    feet  . Rheumatoid arthritis ([place])    at [facility]  . Schatzki's ring   . Wears glasses     Allergies Allergies  Allergen Reactions  . Paclitaxel Other (See Comments)    Flushing, Itching, Throat tightness    IV Location/Drains/Wounds Patient Lines/Drains/Airways Status    Active Line/Drains/Airways    Name Placement date Placement time Site Days   Peripheral IV 08/15/20 Right Hand 08/15/20  0350  Hand  less than 1   Peripheral IV 08/15/20 Left Wrist 08/15/20  0453  Wrist  less than 1   Incision 05/13/12 Neck Other (Comment) 05/13/12  1238  -- 3016          Labs/Imaging Results for  orders placed or performed during the hospital encounter of 08/15/20 (from the past 48 hour(s))  CBC with Differential     Status: Abnormal   Collection Time: 08/15/20  3:35 AM  Result Value Ref Range   WBC 4.1 4.0 - 10.5 K/uL   RBC 2.07 (L) 3.87 - 5.11 MIL/uL   Hemoglobin 7.4 (L) 12.0 - 15.0 g/dL   HCT 22.1 (L) 36.0 - 46.0 %   MCV 106.8 (H) 80.0 - 100.0 fL   MCH 35.7 (H) 26.0 - 34.0 pg   MCHC 33.5 30.0 - 36.0 g/dL   RDW 17.2 (H) 11.5 - 15.5 %   Platelets 88 (L) 150 - 400 K/uL    Comment: SPECIMEN CHECKED FOR CLOTS Immature Platelet Fraction may be clinically indicated, consider ordering this additional test [id number] CONSISTENT WITH PREVIOUS RESULT REPEATED TO VERIFY    nRBC 0.5 (H) 0.0 - 0.2 %   Neutrophils Relative % 47 %   Neutro Abs 1.9 1.7 - 7.7 K/uL   Lymphocytes Relative 42 %   Lymphs Abs 1.7 0.7 - 4.0 K/uL   Monocytes Relative 10 %   Monocytes Absolute 0.4 0.1 - 1.0 K/uL   Eosinophils Relative 1 %   Eosinophils Absolute 0.0 0.0 - 0.5 K/uL   Basophils Relative 0 %   Basophils Absolute 0.0 0.0 - 0.1 K/uL   Immature Granulocytes 0 %   Abs Immature Granulocytes 0.01 0.00 - 0.07 K/uL  Reactive, Benign Lymphocytes PRESENT    Polychromasia PRESENT     Comment: Performed at [facility], [place] [address] [place], [place] [address]  Comprehensive metabolic panel     Status: Abnormal   Collection Time: 08/15/20  3:35 AM  Result Value Ref Range   Sodium 141 135 - 145 mmol/L   Potassium 2.9 (L) 3.5 - 5.1 mmol/L   Chloride 106 98 - 111 mmol/L   CO2 25 22 - 32 mmol/L   Glucose, Bld 103 (H) 70 - 99 mg/dL    Comment: Glucose reference range applies only to samples taken after fasting for at least 8 hours.   BUN 9 8 - 23 mg/dL   Creatinine, Ser 0.66 0.44 - 1.00 mg/dL   Calcium 7.9 (L) 8.9 - 10.3 mg/dL   Total Protein 6.6 6.5 - 8.1 g/dL   Albumin 3.9 3.5 - 5.0 g/dL   AST 22 15 - 41 U/L   ALT 19 0 - 44 U/L   Alkaline Phosphatase 70 38 - 126 U/L   Total  Bilirubin 0.3 0.3 - 1.2 mg/dL   GFR, Estimated >60 >60 mL/min    Comment: (NOTE) Calculated using the CKD-EPI Creatinine Equation (2021)    Anion gap 10 5 - 15    Comment: Performed at [facility], [place] [address], [place], [place] [address]  Magnesium     Status: Abnormal   Collection Time: 08/15/20  3:35 AM  Result Value Ref Range   Magnesium 1.0 (L) 1.7 - 2.4 mg/dL    Comment: Performed at [facility], [place] [address] [place], [place] [address]  CK     Status: None   Collection Time: 08/15/20  3:35 AM  Result Value Ref Range   Total CK 189 38 - 234 U/L    Comment: Performed at [facility], [place] [address] [place], [place] [address]  Protime-INR     Status: Abnormal   Collection Time: 08/15/20  3:35 AM  Result Value Ref Range   Prothrombin Time 20.7 (H) 11.4 - 15.2 seconds   INR 1.9 (H) 0.8 - 1.2    Comment: (NOTE) INR goal varies based on device and disease states. Performed at [facility], [place] [address] [place], [place] [address]   Troponin I (High Sensitivity)     Status: None   Collection Time: 08/15/20  3:35 AM  Result Value Ref Range   Troponin I (High Sensitivity) 6 <18 ng/L    Comment: (NOTE) Elevated high sensitivity troponin I (hsTnI) values and significant  changes across serial measurements may suggest ACS but many other  chronic and acute conditions are known to elevate hsTnI results.  Refer to the "Links" section for chest pain algorithms and additional  guidance. Performed at [facility], [place] [address] [place],  [address]   Resp Panel by RT-PCR (Flu A&B, Covid) Nasopharyngeal Swab     Status: None   Collection Time: 08/15/20  5:17 AM   Specimen: Nasopharyngeal Swab; Nasopharyngeal(NP) swabs in vial transport medium  Result Value Ref Range   SARS Coronavirus 2 by RT PCR NEGATIVE NEGATIVE    Comment: (NOTE) SARS-CoV-2 target nucleic acids are NOT  DETECTED.  The SARS-CoV-2 RNA is generally detectable in upper respiratory specimens during the acute phase of infection. The lowest concentration of SARS-CoV-2 viral copies this assay can detect is 138 copies/mL. A negative result does not preclude SARS-Cov-2 infection and should not be used  as the sole basis for treatment or other patient management decisions. A negative result may occur with  improper specimen collection/handling, submission of specimen other than nasopharyngeal swab, presence of viral mutation(s) within the areas targeted by this assay, and inadequate number of viral copies(<138 copies/mL). A negative result must be combined with clinical observations, patient history, and epidemiological information. The expected result is Negative.  Fact Sheet for Patients:  EntrepreneurPulse.com.au  Fact Sheet for Healthcare Providers:  IncredibleEmployment.be  This test is no t yet approved or cleared by the [place] FDA and  has been authorized for detection and/or diagnosis of SARS-CoV-2 by FDA under an Emergency Use Authorization (EUA). This EUA will remain  in effect (meaning this test can be used) for the duration of the COVID-19 declaration under Section 564(b)(1) of the Act, 21 U.S.C.section 360bbb-3(b)(1), unless the authorization is terminated  or revoked sooner.       Influenza A by PCR NEGATIVE NEGATIVE   Influenza B by PCR NEGATIVE NEGATIVE    Comment: (NOTE) The Xpert Xpress SARS-CoV-2/FLU/RSV plus assay is intended as an aid in the diagnosis of influenza from Nasopharyngeal swab specimens and should not be used as a sole basis for treatment. Nasal washings and aspirates are unacceptable for Xpert Xpress SARS-CoV-2/FLU/RSV testing.  Fact Sheet for Patients: EntrepreneurPulse.com.au  Fact Sheet for Healthcare Providers: IncredibleEmployment.be  This test is not yet approved or  cleared by the [place] FDA and has been authorized for detection and/or diagnosis of SARS-CoV-2 by FDA under an Emergency Use Authorization (EUA). This EUA will remain in effect (meaning this test can be used) for the duration of the COVID-19 declaration under Section 564(b)(1) of the Act, 21 U.S.C. section 360bbb-3(b)(1), unless the authorization is terminated or revoked.  Performed at [facility], [place] [address], [place], [place] [address]   POC occult blood, ED Provider will collect     Status: None   Collection Time: 08/15/20  5:20 AM  Result Value Ref Range   Fecal Occult Bld NEGATIVE NEGATIVE  Troponin I (High Sensitivity)     Status: None   Collection Time: 08/15/20  5:24 AM  Result Value Ref Range   Troponin I (High Sensitivity) 6 <18 ng/L    Comment: (NOTE) Elevated high sensitivity troponin I (hsTnI) values and significant  changes across serial measurements may suggest ACS but many other  chronic and acute conditions are known to elevate hsTnI results.  Refer to the "Links" section for chest pain algorithms and additional  guidance. Performed at [facility], [place] [address], [place], [place] [address]   Prepare RBC (crossmatch)     Status: None   Collection Time: 08/15/20  5:25 AM  Result Value Ref Range   Order Confirmation      ORDER PROCESSED BY BLOOD BANK Performed at [facility], [place] [address], [place], [place] [address]   Type and screen [place]     Status: None (Preliminary result)   Collection Time: 08/15/20  5:25 AM  Result Value Ref Range   ABO/RH(D) A POS    Antibody Screen NEG    Sample Expiration 08/18/2020,2359    Unit Number [id number]    Blood Component Type RED CELLS,LR    Unit division 00    Status of Unit ALLOCATED    Transfusion Status OK TO TRANSFUSE    Crossmatch Result      Compatible Performed at [facility], [place] [address] [place], [place] [address]   ABO/Rh     Status: None   Collection Time: 08/15/20  6:04 AM  Result Value Ref Range   ABO/RH(D)      A POS Performed at [facility], [place] [address], [place], [place] [address]    DG Chest Portable 1 View  Result Date: 08/15/2020 CLINICAL DATA:  Chest pain EXAM: PORTABLE CHEST 1 VIEW COMPARISON:  07/27/2018 FINDINGS: The heart size and mediastinal contours are within normal limits. Both lungs are clear. Lung bases are not included in the field of view. The visualized skeletal structures are unremarkable. IMPRESSION: No active disease. Electronically Signed   By: Ulyses Jarred M.D.   On: 08/15/2020 03:47   VAS [place] ABI WITH/WO TBI  Result Date: 08/14/2020 LOWER EXTREMITY DOPPLER STUDY Indications: Right foot and ankle pain. Patient complains of difficulty walking              due to foot pain. High Risk Factors: Hypertension, hyperlipidemia, past history of smoking.  Vascular Interventions: History of arterial thromboembolism and thrombectomy of                         the right lower extremity. Aortic stent placement and                         right SFA, popliteal and tibial artery thrombectomies at                         [facility] in 08/2018. Performing Technologist: Ronal Fear RVS, RCS  Examination Guidelines: A complete evaluation includes at minimum, Doppler waveform signals and systolic blood pressure reading at the level of bilateral brachial, anterior tibial, and posterior tibial arteries, when vessel segments are accessible. Bilateral testing is considered an integral part of a complete examination. Photoelectric Plethysmograph (PPG) waveforms and toe systolic pressure readings are included as required and additional duplex testing as needed. Limited examinations for reoccurring indications may be performed as noted.  ABI Findings: +---------+------------------+-----+---------+--------+ Right    Rt Pressure (mmHg)IndexWaveform Comment   +---------+------------------+-----+---------+--------+ Brachial 155                                      +---------+------------------+-----+---------+--------+ PTA      165               1.06 triphasic         +---------+------------------+-----+---------+--------+ DP       166               1.07 triphasic         +---------+------------------+-----+---------+--------+ Great Toe140               0.90                   +---------+------------------+-----+---------+--------+ +---------+------------------+-----+---------+-------+ Left     Lt Pressure (mmHg)IndexWaveform Comment +---------+------------------+-----+---------+-------+ Brachial 155                                     +---------+------------------+-----+---------+-------+ PTA      172               1.11 triphasic        +---------+------------------+-----+---------+-------+ DP  176               1.14 triphasic        +---------+------------------+-----+---------+-------+ Great Toe139               0.90                  +---------+------------------+-----+---------+-------+ +-------+-----------+-----------+------------+------------+ ABI/TBIToday's ABIToday's TBIPrevious ABIPrevious TBI +-------+-----------+-----------+------------+------------+ Right  1.07       0.90       1.15        0.59         +-------+-----------+-----------+------------+------------+ Left   1.14       0.90       1.08        1.51         +-------+-----------+-----------+------------+------------+  Bilateral ABIs appear essentially unchanged compared to prior study on 12/15/2018.  Summary: Right: Resting right ankle-brachial index is within normal range. No evidence of significant right lower extremity arterial disease. The right toe-brachial index is normal. Left: Resting left ankle-brachial index is within normal range. No evidence of significant left lower extremity arterial disease. The left toe-brachial  index is normal.  *See table(s) above for measurements and observations.  Electronically signed by Jamelle Haring on 08/14/2020 at 3:43:24 PM.    Final     Pending Labs Unresulted Labs (From admission, onward)         None      Vitals/Pain Today's Vitals   08/15/20 0345 08/15/20 0456 08/15/20 0545 08/15/20 0714  BP: (!) 132/59 130/84 (!) 149/66 130/63  Pulse: 73 66 72 73  Resp: 17 16 14 14   Temp:      SpO2: 97% 99% 95% 99%  Weight:      Height:      PainSc:    0-No pain    Isolation Precautions No active isolations  Medications Medications  potassium chloride SA (KLOR-CON) CR tablet 40 mEq (has no administration in time range)  magnesium sulfate IVPB 2 g 50 mL (0 g Intravenous Stopped 08/15/20 0532)  potassium chloride 10 mEq in 100 mL IVPB (10 mEq Intravenous New Bag/Given 08/15/20 0645)  calcium carbonate (TUMS - dosed in mg elemental calcium) chewable tablet 200 mg of elemental calcium (200 mg of elemental calcium Oral Given 08/15/20 0438)  acetaminophen (TYLENOL) tablet 650 mg (650 mg Oral Given 08/15/20 0508)  0.9 %  sodium chloride infusion (0 mL/hr Intravenous Stopped 08/15/20 0648)    Mobility non-ambulatory

## 2020-08-15 NOTE — ED Notes (Signed)
Pt called out and stated right hand 22g peripheral IV was burning. This nurse paused the pt's potassium infusion as well as normal saline bolus. Pt's IV was flushed and is patent. Potassium infusion reattached and restarted behind NS bolus. Pt denies complaints at this time.

## 2020-08-15 NOTE — H&P (Addendum)
History and Physical    Donna [name] WUJ:[id number] DOB: [birth year] DOA: 08/15/2020  PCP: Susy Frizzle, MD  Patient coming from: Home  Chief Complaint: lab abnormalities  HPI: Donna [name] is a 60 y.o. female with medical history significant of ovarian cancer on chemo, GERD, HLD, HTN. Presenting after being called by her PCP for lab abnormalities. She states that she saw her PCP for regularly scheduled lab draws. She was called at 0130hrs this morning by her PCP team. They informed her that he magnesium was critically low and that she needed to go to the ED. She reports that she has had some intermittent chest pressure that seems to radiate to her back between the shoulders. She reports a history of atypical angina and says these symptoms are similar to those that appear during times of stress. Otherwise, she denies any alleviating or aggravating factors.      ED Course: Her K+ was 2.9 and her Mg2+ was 1.0. Her Hgb was 7.4. This is down from 9.4 on 07/27/20. She was FOBT negative. She had K+, Mg2+, and pRBCs ordered. TRH was called for admission.   Review of Systems:  Review of systems is otherwise negative for all not mentioned in HPI.   PMHx Past Medical History:  Diagnosis Date  . Anxiety   . Arterial thrombosis (HCC)    right sfa, popliteal artery.  Require thromectomy at [facility] and lifelong anticoagulation.  . Cancer ([place])    ovarian, recurrent on chemo (carboplatin, doxorubicin) at [facility]  . Colon polyp   . Depression   . Full dentures   . GERD (gastroesophageal reflux disease)   . HLD (hyperlipidemia) 12/03/2012  . Hypertension   . Hypothyroidism   . IBS (irritable bowel syndrome)   . Neck mass   . Neuropathy    feet  . Rheumatoid arthritis ([place])    at [facility]  . Schatzki's ring   . Wears glasses     PSHx Past Surgical History:  Procedure Laterality Date  . ABDOMINAL HYSTERECTOMY    . ABLATION    . COLONOSCOPY    . CYST REMOVAL NECK  05/13/12  . EAR CYST EXCISION   05/13/2012   Procedure: CYST REMOVAL;  Surgeon: Ralene Ok, MD;  Location: [place];  Service: General;  Laterality: Left;  excision of neck cyst  . THYROID SURGERY  10/2011 - approximate   ablation  . TONSILLECTOMY    . UPPER GI ENDOSCOPY      SocHx  reports that she quit smoking about 9 years ago. She quit after 32.00 years of use. She has never used smokeless tobacco. She reports that she does not drink alcohol and does not use drugs.  Allergies  Allergen Reactions  . Paclitaxel Other (See Comments)    Flushing, Itching, Throat tightness    FamHx Family History  Problem Relation Age of Onset  . Heart attack Mother   . Hypertension Mother   . Diabetes Mother   . Heart disease Mother   . CVA Father   . Hypertension Father   . Diabetes Father   . Stroke Father   . Cancer Brother        lung cancer - stage 4  . Cancer Brother        skin   . Heart disease Maternal Grandfather   . Heart attack Sister        CABG  . Diabetes Sister   . Stroke Sister   . Hypertension Sister   .  Hyperlipidemia Sister   . Kidney disease Sister   . Stroke Sister   . Heart attack Sister     Prior to Admission medications   Medication Sig Start Date End Date Taking? Authorizing Provider  amLODipine (NORVASC) 10 MG tablet Take 1 tablet by mouth once daily 07/13/20  Yes Pickard, Cammie Mcgee, MD  atorvastatin (LIPITOR) 40 MG tablet Take 2 tablets by mouth once daily 06/13/20  Yes Susy Frizzle, MD  Cholecalciferol (VITAMIN D3) 50 MCG (2000 UT) capsule Take 2,000 Units by mouth daily. 10/17/18  Yes [provider]  dexamethasone (DECADRON) 4 MG tablet Take 4 mg by mouth See admin instructions. Takes 2 tablets 2 days after chemo possibly - pt unsure 08/10/18  Yes [provider]  diphenhydrAMINE (BENADRYL) 25 MG tablet Take 25 mg by mouth at bedtime as needed. For sleep   Yes [provider]  gabapentin (NEURONTIN) 300 MG capsule Take 1 capsule (300 mg total) by mouth 2  (two) times daily. 04/18/20  Yes Susy Frizzle, MD  leflunomide (ARAVA) 20 MG tablet Take 20 mg by mouth daily. 01/20/19  Yes [provider]  levothyroxine (SYNTHROID) 175 MCG tablet TAKE 1 TABLET BY MOUTH ONCE DAILY BEFORE BREAKFAST **DISCONTINUE  150  MCG  LEVOTHYROXINE** 07/17/20  Yes Susy Frizzle, MD  losartan (COZAAR) 50 MG tablet Take 1 tablet by mouth once daily 07/13/20  Yes Pickard, Cammie Mcgee, MD  OLANZapine (ZYPREXA) 5 MG tablet Take 1 tablet by mouth nightly for 4 nights, starting on the day of chemotherapy each cycle. 08/10/18  Yes [provider]  omeprazole (PRILOSEC) 40 MG capsule Take 1 capsule (40 mg total) by mouth in the morning and at bedtime. 01/16/20  Yes Susy Frizzle, MD  ondansetron (ZOFRAN) 8 MG tablet Take 8 mg by mouth daily as needed for nausea, vomiting or refractory nausea / vomiting. 08/10/18  Yes [provider]  potassium chloride SA (KLOR-CON) 20 MEQ tablet Take 2 tablets (40 mEq total) by mouth daily. Patient taking differently: Take 40 mEq by mouth daily. Past 6 days per DR due to low Potassium levels patient has been taking 81mEq 07/17/20  Yes Pickard, Cammie Mcgee, MD  prochlorperazine (COMPAZINE) 10 MG tablet Take 1 tablet (10 mg) by mouth every six hours as needed for nausea if not controlled with other nausea medicines. 02/03/20  Yes [provider]  vitamin B-12 (CYANOCOBALAMIN) 1000 MCG tablet Take 1,000 mcg by mouth daily.   Yes [provider]  WARFARIN SODIUM PO Take 12.5 mg by mouth daily.   Yes [provider]  furosemide (LASIX) 20 MG tablet Take 1 tablet (20 mg total) by mouth daily. 05/15/20   Susy Frizzle, MD  warfarin (COUMADIN) 5 MG tablet TAKE 1 & 1/2 (ONE & ONE-HALF) TABLETS BY MOUTH ONCE DAILY ON TUE, THURS, SAT, & SUN. THEN TAKE 2 TABLETS BY MOUTH ONCE DAILY ON OTHER DAYS Patient not taking: Reported on 08/15/2020 04/16/20   Susy Frizzle, MD    Physical Exam: Vitals:   08/15/20  0345 08/15/20 0456 08/15/20 0545 08/15/20 0714  BP: (!) 132/59 130/84 (!) 149/66 130/63  Pulse: 73 66 72 73  Resp: 17 16 14 14   Temp:      SpO2: 97% 99% 95% 99%  Weight:      Height:        General: 60 y.o. female resting in bed in NAD Eyes: PERRL, normal sclera ENMT: Nares patent w/o discharge, orophaynx clear,  dentition normal, ears w/o discharge/lesions/ulcers Neck: Supple, trachea midline Cardiovascular: RRR, +S1, S2, no m/g/r, equal pulses throughout Respiratory: CTABL, no w/r/r, normal WOB GI: BS+, NDNT, no masses noted, no organomegaly noted MSK: No e/c/c Skin: No rashes, bruises, ulcerations noted Neuro: A&O x 3, no focal deficits Psyc: Appropriate interaction and affect, calm/cooperative  Labs on Admission: I have personally reviewed following labs and imaging studies  CBC: Recent Labs  Lab 08/15/20 0335  WBC 4.1  NEUTROABS 1.9  HGB 7.4*  HCT 22.1*  MCV 106.8*  PLT 88*   Basic Metabolic Panel: Recent Labs  Lab 08/14/20 1545 08/15/20 0335  NA 143 141  K 3.2* 2.9*  CL 103 106  CO2 29 25  GLUCOSE 131* 103*  BUN 9 9  CREATININE 0.61 0.66  CALCIUM 8.0* 7.9*  MG 0.9* 1.0*   GFR: Estimated Creatinine Clearance: 94.4 mL/min (by C-G formula based on SCr of 0.66 mg/dL). Liver Function Tests: Recent Labs  Lab 08/15/20 0335  AST 22  ALT 19  ALKPHOS 70  BILITOT 0.3  PROT 6.6  ALBUMIN 3.9   No results for input(s): LIPASE, AMYLASE in the last 168 hours. No results for input(s): AMMONIA in the last 168 hours. Coagulation Profile: Recent Labs  Lab 08/15/20 0335  INR 1.9*   Cardiac Enzymes: Recent Labs  Lab 08/15/20 0335  CKTOTAL 189   BNP (last 3 results) No results for input(s): PROBNP in the last 8760 hours. HbA1C: Recent Labs    08/14/20 1545  HGBA1C 5.2   CBG: No results for input(s): GLUCAP in the last 168 hours. Lipid Profile: No results for input(s): CHOL, HDL, LDLCALC, TRIG, CHOLHDL, LDLDIRECT in the last 72 hours. Thyroid  Function Tests: No results for input(s): TSH, T4TOTAL, FREET4, T3FREE, THYROIDAB in the last 72 hours. Anemia Panel: No results for input(s): VITAMINB12, FOLATE, FERRITIN, TIBC, IRON, RETICCTPCT in the last 72 hours. Urine analysis:    Component Value Date/Time   COLORURINE YELLOW 07/27/2018 1038   APPEARANCEUR CLEAR 07/27/2018 1038   LABSPEC 1.025 07/27/2018 1038   PHURINE < OR = 5.0 07/27/2018 1038   GLUCOSEU NEGATIVE 07/27/2018 1038   HGBUR NEGATIVE 07/27/2018 1038   BILIRUBINUR NEGATIVE 08/15/2014 0701   KETONESUR NEGATIVE 07/27/2018 1038   PROTEINUR NEGATIVE 07/27/2018 1038   UROBILINOGEN 0.2 08/15/2014 0701   NITRITE NEGATIVE 07/27/2018 1038   LEUKOCYTESUR NEGATIVE 07/27/2018 1038    Radiological Exams on Admission: DG Chest Portable 1 View  Result Date: 08/15/2020 CLINICAL DATA:  Chest pain EXAM: PORTABLE CHEST 1 VIEW COMPARISON:  07/27/2018 FINDINGS: The heart size and mediastinal contours are within normal limits. Both lungs are clear. Lung bases are not included in the field of view. The visualized skeletal structures are unremarkable. IMPRESSION: No active disease. Electronically Signed   By: Ulyses Jarred M.D.   On: 08/15/2020 03:47   VAS [place] ABI WITH/WO TBI  Result Date: 08/14/2020 LOWER EXTREMITY DOPPLER STUDY Indications: Right foot and ankle pain. Patient complains of difficulty walking              due to foot pain. High Risk Factors: Hypertension, hyperlipidemia, past history of smoking.  Vascular Interventions: History of arterial thromboembolism and thrombectomy of                         the right lower extremity. Aortic stent placement and  right SFA, popliteal and tibial artery thrombectomies at                         [facility] in 08/2018. Performing Technologist: Ronal Fear RVS, RCS  Examination Guidelines: A complete evaluation includes at minimum, Doppler waveform signals and systolic blood pressure reading at the level of bilateral  brachial, anterior tibial, and posterior tibial arteries, when vessel segments are accessible. Bilateral testing is considered an integral part of a complete examination. Photoelectric Plethysmograph (PPG) waveforms and toe systolic pressure readings are included as required and additional duplex testing as needed. Limited examinations for reoccurring indications may be performed as noted.  ABI Findings: +---------+------------------+-----+---------+--------+ Right    Rt Pressure (mmHg)IndexWaveform Comment  +---------+------------------+-----+---------+--------+ Brachial 155                                      +---------+------------------+-----+---------+--------+ PTA      165               1.06 triphasic         +---------+------------------+-----+---------+--------+ DP       166               1.07 triphasic         +---------+------------------+-----+---------+--------+ Great Toe140               0.90                   +---------+------------------+-----+---------+--------+ +---------+------------------+-----+---------+-------+ Left     Lt Pressure (mmHg)IndexWaveform Comment +---------+------------------+-----+---------+-------+ Brachial 155                                     +---------+------------------+-----+---------+-------+ PTA      172               1.11 triphasic        +---------+------------------+-----+---------+-------+ DP       176               1.14 triphasic        +---------+------------------+-----+---------+-------+ Great Toe139               0.90                  +---------+------------------+-----+---------+-------+ +-------+-----------+-----------+------------+------------+ ABI/TBIToday's ABIToday's TBIPrevious ABIPrevious TBI +-------+-----------+-----------+------------+------------+ Right  1.07       0.90       1.15        0.59         +-------+-----------+-----------+------------+------------+ Left   1.14        0.90       1.08        1.51         +-------+-----------+-----------+------------+------------+  Bilateral ABIs appear essentially unchanged compared to prior study on 12/15/2018.  Summary: Right: Resting right ankle-brachial index is within normal range. No evidence of significant right lower extremity arterial disease. The right toe-brachial index is normal. Left: Resting left ankle-brachial index is within normal range. No evidence of significant left lower extremity arterial disease. The left toe-brachial index is normal.  *See table(s) above for measurements and observations.  Electronically signed by Jamelle Haring on 08/14/2020 at 3:43:24 PM.    Final     EKG: Independently reviewed. Sinus, no st changes  Assessment/Plan Hypokalemia Hypomagnesemia Hypocalcemia     -  admitted to inpt, med-surg     - received 2g Mg2+ and 20 mEq K+ in ED     - repeat renal function at 1400hrs; schedule K+ 40 mEq BID, add Ca2+  Macrocytic anemia Symptomatic anemia Thrombocytopenia     - 1 unit pRBCs ordered in ED     - FOBT is negative     - no evidence of bleed; likely secondary to chemo     - follow H&H tonight     - can continue coumadin  Chest pressure    - EKG is ok    - Trp are negative x 2    - symptoms are resolved  Ovarian CA     - continue outpt follow up with [facility]  Long-term anticoagulation use     - for Hx of aortic thrombus     - pharm onboard for dosing  HLD     - continue statin  GERD     - protonix  HTN     - continue norvasc, losartan  DVT prophylaxis: coumadin  Code Status: FULL  Family Communication: None at bedside  Consults called: None   Status is: Inpatient  Remains inpatient appropriate because:Inpatient level of care appropriate due to severity of illness   Dispo: The patient is from: Home              Anticipated d/c is to: Home              Patient currently is not medically stable to d/c.   Difficult to place patient No  Jonnie Finner DO [facility]  If 7PM-7AM, please contact night-coverage [email or web address]  08/15/2020, 7:18 AM

## 2020-08-15 NOTE — Telephone Encounter (Signed)
Received page from [facility] regarding abnormal labs for this patient.  Magnesium level is 0.9 mg/dL and this was verified via repeat analysis.  I immediately called the patient and confirmed her DOB.  I introduced myself and explained why I was calling her.  I explained to the patient that her magnesium level was very low and this could cause irregular heart rhythms not compatible with life.  I recommended she go to the nearest ER ASAP to have cardiac monitoring and IV replacement of the Magnesium.  The patient was hesitant to go to the ER, however verbalized that she would follow my recommendations.  She was not having chest pain or shortness of breath at the time of my call.

## 2020-08-15 NOTE — Telephone Encounter (Signed)
Retrieved message left on 3/30 at 1:21am by Mechele Claude at [facility]; message stated results are critical. Please return her call at [phone] and use reference #[id number] R.

## 2020-08-15 NOTE — ED Notes (Signed)
Report received from [place], RN. Pt resting comfortably in bed. No complaints or concerns at this time. VSS.

## 2020-08-15 NOTE — ED Notes (Signed)
Pt transported out of the dept to floor by ED tech via stretcher.

## 2020-08-15 NOTE — Telephone Encounter (Signed)
K+ 3.2 Mag 0.9  Patient is currently taking Potassium 60 mEq PO QD.   Patient was instructed to go to ER. Noted that she is currently being evaluated and plan to be admitted with IV potassium/ magnesium replenishment and packed RBC's for symptomatic anemia.

## 2020-08-15 NOTE — ED Provider Notes (Signed)
[facility] DEPT Provider Note   CSN: [id number] Arrival date & time: 08/15/20  0242     History Chief Complaint  Patient presents with  . Abnormal Lab    Donna [name] is a 60 y.o. 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  The patient received a call 1 to 2 hours prior to arrival stating that her magnesium level that was checked in her PCPs office yesterday was very low and she should go immediately to the emergency department.  Per chart review, magnesium level was 0.9.  The patient reports that she has been having low potassium levels for the last few weeks and was less than 3 despite taking 40 mEq of potassium a day that was increased to 60 mEq a day for the last 6 days.  She was previously taking Lasix for swelling in her face, but this was discontinued yesterday due to her low electrolyte levels.  No abdominal or lower extremity swelling.  She does report that she has a history of atypical angina.  She does currently endorse pressure-like discomfort in her chest that radiates through to her back between her shoulder blades and into her shoulder.  However, she does report a history of similar symptoms when she has been more stressed or anxious.  She denies diarrhea, abdominal pain, fever, chills, shortness of breath, paresthesias, numbness, or weakness.  She does report that she has been having muscle cramps at night.  She did have 1 episode of vomiting earlier today, but reports that this was when she was helping to assist her brother who is in a wheelchair and  she hit her abdomen, which caused her to vomit.  She denies any nausea at this time.   The history is provided by the patient and medical records. No language interpreter was used.       Past Medical History:  Diagnosis Date  . Anxiety   . Arterial thrombosis (HCC)    right sfa, popliteal artery.  Require thromectomy at [facility] and lifelong anticoagulation.  . Cancer ([place])    ovarian, recurrent on chemo (carboplatin, doxorubicin) at [facility]  . Colon polyp   . Depression   . Full dentures   . GERD (gastroesophageal reflux disease)   . HLD (hyperlipidemia) 12/03/2012  . Hypertension   . Hypothyroidism   . IBS (irritable bowel syndrome)   . Neck mass   . Neuropathy    feet  . Rheumatoid arthritis ([place])    at [facility]  . Schatzki's ring   . Wears glasses     Patient Active Problem List   Diagnosis Date Noted  . Symptomatic anemia 08/15/2020  . Rheumatoid arthritis ([place])   . Arterial thrombosis ([place])   . Nodule of vagina 02/03/2017  . Pulmonary nodule 02/03/2017  . Primary malignant neoplasm of ovary ([place]) 12/07/2014  . Hypothyroidism 12/03/2012  . HLD (hyperlipidemia) 12/03/2012  . OVERWEIGHT 06/22/2007  . NICOTINE ADDICTION 06/22/2007  . DEPRESSION 06/22/2007  . Essential hypertension 06/22/2007  . GERD 06/22/2007  . OSTEOARTHRITIS 06/22/2007    Past Surgical History:  Procedure Laterality Date  . ABDOMINAL HYSTERECTOMY    . ABLATION    .  COLONOSCOPY    . CYST REMOVAL NECK  05/13/12  . EAR CYST EXCISION  05/13/2012   Procedure: CYST REMOVAL;  Surgeon: Ralene Ok, MD;  Location: [facility];  Service: General;  Laterality: Left;  excision of neck cyst  . THYROID SURGERY  10/2011 - approximate   ablation  . TONSILLECTOMY    . UPPER GI ENDOSCOPY       OB History   No obstetric history on file.     Family History  Problem Relation Age of Onset  . Heart attack Mother   . Hypertension Mother   . Diabetes Mother   . Heart disease Mother   . CVA Father   .  Hypertension Father   . Diabetes Father   . Stroke Father   . Cancer Brother        lung cancer - stage 4  . Cancer Brother        skin   . Heart disease Maternal Grandfather   . Heart attack Sister        CABG  . Diabetes Sister   . Stroke Sister   . Hypertension Sister   . Hyperlipidemia Sister   . Kidney disease Sister   . Stroke Sister   . Heart attack Sister     Social History   Tobacco Use  . Smoking status: Former Smoker    Years: 32.00    Quit date: 06/19/2011    Years since quitting: 9.1  . Smokeless tobacco: Never Used  Vaping Use  . Vaping Use: Never used  Substance Use Topics  . Alcohol use: No  . Drug use: No    Home Medications Prior to Admission medications   Medication Sig Start Date End Date Taking? Authorizing Provider  amLODipine (NORVASC) 10 MG tablet Take 1 tablet by mouth once daily 07/13/20  Yes Pickard, Cammie Mcgee, MD  atorvastatin (LIPITOR) 40 MG tablet Take 2 tablets by mouth once daily 06/13/20  Yes Susy Frizzle, MD  Cholecalciferol (VITAMIN D3) 50 MCG (2000 UT) capsule Take 2,000 Units by mouth daily. 10/17/18  Yes [provider]  dexamethasone (DECADRON) 4 MG tablet Take 4 mg by mouth See admin instructions. Takes 2 tablets 2 days after chemo possibly - pt unsure 08/10/18  Yes [provider]  diphenhydrAMINE (BENADRYL) 25 MG tablet Take 25 mg by mouth at bedtime as needed. For sleep   Yes [provider]  gabapentin (NEURONTIN) 300 MG capsule Take 1 capsule (300 mg total) by mouth 2 (two) times daily. 04/18/20  Yes Susy Frizzle, MD  leflunomide (ARAVA) 20 MG tablet Take 20 mg by mouth daily. 01/20/19  Yes [provider]  levothyroxine (SYNTHROID) 175 MCG tablet TAKE 1 TABLET BY MOUTH ONCE DAILY BEFORE BREAKFAST **DISCONTINUE  150  MCG  LEVOTHYROXINE** 07/17/20  Yes Susy Frizzle, MD  losartan (COZAAR) 50 MG tablet Take 1 tablet by mouth once daily 07/13/20  Yes Pickard, Cammie Mcgee, MD  OLANZapine  (ZYPREXA) 5 MG tablet Take 1 tablet by mouth nightly for 4 nights, starting on the day of chemotherapy each cycle. 08/10/18  Yes [provider]  omeprazole (PRILOSEC) 40 MG capsule Take 1 capsule (40 mg total) by mouth in the morning and at bedtime. 01/16/20  Yes Susy Frizzle, MD  ondansetron (ZOFRAN) 8 MG tablet Take 8 mg by mouth daily as needed for nausea, vomiting or refractory nausea / vomiting. 08/10/18  Yes [provider]  potassium chloride SA (KLOR-CON) 20 MEQ  tablet Take 2 tablets (40 mEq total) by mouth daily. Patient taking differently: Take 40 mEq by mouth daily. Past 6 days per DR due to low Potassium levels patient has been taking 51mEq 07/17/20  Yes Pickard, Cammie Mcgee, MD  prochlorperazine (COMPAZINE) 10 MG tablet Take 1 tablet (10 mg) by mouth every six hours as needed for nausea if not controlled with other nausea medicines. 02/03/20  Yes [provider]  vitamin B-12 (CYANOCOBALAMIN) 1000 MCG tablet Take 1,000 mcg by mouth daily.   Yes [provider]  WARFARIN SODIUM PO Take 12.5 mg by mouth daily.   Yes [provider]  furosemide (LASIX) 20 MG tablet Take 1 tablet (20 mg total) by mouth daily. 05/15/20   Susy Frizzle, MD  warfarin (COUMADIN) 5 MG tablet TAKE 1 & 1/2 (ONE & ONE-HALF) TABLETS BY MOUTH ONCE DAILY ON TUE, THURS, SAT, & SUN. THEN TAKE 2 TABLETS BY MOUTH ONCE DAILY ON OTHER DAYS Patient not taking: Reported on 08/15/2020 04/16/20   Susy Frizzle, MD    Allergies    Paclitaxel  Review of Systems   Review of Systems  Constitutional: Negative for activity change, chills and fever.  HENT: Negative for congestion and sore throat.   Eyes: Negative for visual disturbance.  Respiratory: Negative for cough, choking, shortness of breath and wheezing.   Cardiovascular: Positive for chest pain. Negative for palpitations and leg swelling.  Gastrointestinal: Positive for vomiting (vomiting). Negative for abdominal pain,  constipation, diarrhea and nausea.  Genitourinary: Negative for dysuria and urgency.  Musculoskeletal: Positive for myalgias. Negative for back pain, gait problem, joint swelling, neck pain and neck stiffness.  Skin: Negative for color change, rash and wound.  Allergic/Immunologic: Negative for immunocompromised state.  Neurological: Negative for seizures, syncope, speech difficulty, weakness, numbness and headaches.  Psychiatric/Behavioral: Negative for confusion.    Physical Exam Updated Vital Signs BP (!) 149/66   Pulse 72   Temp 98.3 F (36.8 C)   Resp 14   Ht 5\' 7"  (1.702 m)   Wt 110.2 kg   SpO2 95%   BMI 38.06 kg/m   Physical Exam Vitals and nursing note reviewed.  Constitutional:      General: She is not in acute distress.    Appearance: She is not ill-appearing, toxic-appearing or diaphoretic.     Comments: Well-appearing.  No acute distress.  HENT:     Head: Normocephalic.     Mouth/Throat:     Mouth: Mucous membranes are moist.     Pharynx: No oropharyngeal exudate or posterior oropharyngeal erythema.  Eyes:     Conjunctiva/sclera: Conjunctivae normal.  Cardiovascular:     Rate and Rhythm: Normal rate and regular rhythm.     Pulses: Normal pulses.     Heart sounds: Normal heart sounds. No murmur heard. No friction rub. No gallop.   Pulmonary:     Effort: Pulmonary effort is normal. No respiratory distress.     Breath sounds: No stridor. No wheezing, rhonchi or rales.  Chest:     Chest wall: No tenderness.  Abdominal:     General: There is no distension.     Palpations: Abdomen is soft. There is no mass.     Tenderness: There is no abdominal tenderness. There is no right CVA tenderness, left CVA tenderness, guarding or rebound.     Hernia: No hernia is present.  Musculoskeletal:     Cervical back: Neck supple.     Right lower leg: No edema.  Left lower leg: No edema.  Skin:    General: Skin is warm.     Capillary Refill: Capillary refill takes less  than 2 seconds.     Findings: No rash.  Neurological:     General: No focal deficit present.     Mental Status: She is alert.  Psychiatric:        Behavior: Behavior normal.     ED Results / Procedures / Treatments   Labs (all labs ordered are listed, but only abnormal results are displayed) Labs Reviewed  CBC WITH DIFFERENTIAL/PLATELET - Abnormal; Notable for the following components:      Result Value   RBC 2.07 (*)    Hemoglobin 7.4 (*)    HCT 22.1 (*)    MCV 106.8 (*)    MCH 35.7 (*)    RDW 17.2 (*)    Platelets 88 (*)    nRBC 0.5 (*)    All other components within normal limits  COMPREHENSIVE METABOLIC PANEL - Abnormal; Notable for the following components:   Potassium 2.9 (*)    Glucose, Bld 103 (*)    Calcium 7.9 (*)    All other components within normal limits  MAGNESIUM - Abnormal; Notable for the following components:   Magnesium 1.0 (*)    All other components within normal limits  PROTIME-INR - Abnormal; Notable for the following components:   Prothrombin Time 20.7 (*)    INR 1.9 (*)    All other components within normal limits  RESP PANEL BY RT-PCR (FLU A&B, COVID) ARPGX2  CK  POC OCCULT BLOOD, ED  PREPARE RBC (CROSSMATCH)  TYPE AND SCREEN  ABO/RH  TROPONIN I (HIGH SENSITIVITY)  TROPONIN I (HIGH SENSITIVITY)    EKG EKG Interpretation  Date/Time:  Wednesday August 15 2020 03:10:04 EDT Ventricular Rate:  73 PR Interval:  159 QRS Duration: 96 QT Interval:  394 QTC Calculation: 435 R Axis:   -2 Text Interpretation: Sinus rhythm Low voltage, precordial leads Confirmed by Gerlene Fee [id number]) on 08/15/2020 4:59:51 AM   Radiology DG Chest Portable 1 View  Result Date: 08/15/2020 CLINICAL DATA:  Chest pain EXAM: PORTABLE CHEST 1 VIEW COMPARISON:  07/27/2018 FINDINGS: The heart size and mediastinal contours are within normal limits. Both lungs are clear. Lung bases are not included in the field of view. The visualized skeletal structures are  unremarkable. IMPRESSION: No active disease. Electronically Signed   By: Ulyses Jarred M.D.   On: 08/15/2020 03:47   VAS [place] ABI WITH/WO TBI  Result Date: 08/14/2020 LOWER EXTREMITY DOPPLER STUDY Indications: Right foot and ankle pain. Patient complains of difficulty walking              due to foot pain. High Risk Factors: Hypertension, hyperlipidemia, past history of smoking.  Vascular Interventions: History of arterial thromboembolism and thrombectomy of                         the right lower extremity. Aortic stent placement and                         right SFA, popliteal and tibial artery thrombectomies at                         [facility] in 08/2018. Performing Technologist: Ronal Fear RVS, RCS  Examination Guidelines: A complete evaluation includes at minimum, Doppler waveform signals and systolic blood pressure reading  at the level of bilateral brachial, anterior tibial, and posterior tibial arteries, when vessel segments are accessible. Bilateral testing is considered an integral part of a complete examination. Photoelectric Plethysmograph (PPG) waveforms and toe systolic pressure readings are included as required and additional duplex testing as needed. Limited examinations for reoccurring indications may be performed as noted.  ABI Findings: +---------+------------------+-----+---------+--------+ Right    Rt Pressure (mmHg)IndexWaveform Comment  +---------+------------------+-----+---------+--------+ Brachial 155                                      +---------+------------------+-----+---------+--------+ PTA      165               1.06 triphasic         +---------+------------------+-----+---------+--------+ DP       166               1.07 triphasic         +---------+------------------+-----+---------+--------+ Great Toe140               0.90                   +---------+------------------+-----+---------+--------+ +---------+------------------+-----+---------+-------+  Left     Lt Pressure (mmHg)IndexWaveform Comment +---------+------------------+-----+---------+-------+ Brachial 155                                     +---------+------------------+-----+---------+-------+ PTA      172               1.11 triphasic        +---------+------------------+-----+---------+-------+ DP       176               1.14 triphasic        +---------+------------------+-----+---------+-------+ Great Toe139               0.90                  +---------+------------------+-----+---------+-------+ +-------+-----------+-----------+------------+------------+ ABI/TBIToday's ABIToday's TBIPrevious ABIPrevious TBI +-------+-----------+-----------+------------+------------+ Right  1.07       0.90       1.15        0.59         +-------+-----------+-----------+------------+------------+ Left   1.14       0.90       1.08        1.51         +-------+-----------+-----------+------------+------------+  Bilateral ABIs appear essentially unchanged compared to prior study on 12/15/2018.  Summary: Right: Resting right ankle-brachial index is within normal range. No evidence of significant right lower extremity arterial disease. The right toe-brachial index is normal. Left: Resting left ankle-brachial index is within normal range. No evidence of significant left lower extremity arterial disease. The left toe-brachial index is normal.  *See table(s) above for measurements and observations.  Electronically signed by Jamelle Haring on 08/14/2020 at 3:43:24 PM.    Final     Procedures .Critical Care Performed by: Joanne Gavel, PA-C Authorized by: Joanne Gavel, PA-C   Critical care provider statement:    Critical care time (minutes):  40   Critical care time was exclusive of:  Separately billable procedures and treating other patients and teaching time   Critical care was necessary to treat or prevent imminent or life-threatening deterioration of the  following conditions:  Circulatory failure and metabolic crisis  Critical care was time spent personally by me on the following activities:  Ordering and performing treatments and interventions, ordering and review of laboratory studies, ordering and review of radiographic studies, pulse oximetry, re-evaluation of patient's condition, review of old charts, obtaining history from patient or surrogate, examination of patient and evaluation of patient's response to treatment   I assumed direction of critical care for this patient from another provider in my specialty: no     Care discussed with: admitting provider       Medications Ordered in ED Medications  potassium chloride 10 mEq in 100 mL IVPB (10 mEq Intravenous New Bag/Given 08/15/20 0645)  magnesium sulfate IVPB 2 g 50 mL (0 g Intravenous Stopped 08/15/20 0532)  calcium carbonate (TUMS - dosed in mg elemental calcium) chewable tablet 200 mg of elemental calcium (200 mg of elemental calcium Oral Given 08/15/20 0438)  acetaminophen (TYLENOL) tablet 650 mg (650 mg Oral Given 08/15/20 0508)  0.9 %  sodium chloride infusion (10 mL/hr Intravenous New Bag/Given 08/15/20 0521)    ED Course  I have reviewed the triage vital signs and the nursing notes.  Pertinent labs & imaging results that were available during my care of the patient were reviewed by me and considered in my medical decision making (see chart for details).    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  The patient received a call prior  to arrival that her magnesium level that was checked earlier in the day at her PCPs office was very low.  The patient has also been having low potassium despite increasing her home supplementation over the last week.  She has been on Lasix, but this was discontinued yesterday.  In the ER, she is endorsing chest pain, but has no other complaints.  Vital signs are stable. The patient was seen and evaluated by Dr. Sedonia Small, attending physician.   Labs and imaging of been reviewed and independently interpreted by me.  Chest x-ray is unremarkable.  EKG was sinus rhythm. Magnesium is 1.0.  Potassium is 2.9.  IV potassium and magnesium have been ordered for replenishment.  She is also anemic with a hemoglobin of 7.4.  Down from 9.5 in Dec. 2021 and down from 9.4 on March 11 at [facility].  She did recently have an endoscopy with GI.  She denies any melena or hematochezia.  Hemoccult is negative.  She does report that she recently had a blood transfusion in December after she had a hemoglobin of 7.9 and was having symptomatic anemia.  She reports that she has been more fatigued and weak over the last few months that has been progressively worsening.  Will transfuse with packed RBCs due to symptomatic anemia and patient will require admission.  Doubt GI bleed or ruptured peptic ulcer disease.  The patient appears reasonably stabilized for admission considering the current resources, flow, and capabilities available in the ED at this time, and I doubt any other [facility] requiring further screening and/or treatment in the ED prior to admission.  Final  Clinical Impression(s) / ED Diagnoses Final diagnoses:  Hypomagnesemia  Hypokalemia  Symptomatic anemia    Rx / DC Orders ED Discharge Orders    None       Joanne Gavel, PA-C 08/15/20 0648    Maudie Flakes, MD 08/15/20 [phone]

## 2020-08-15 NOTE — ED Notes (Signed)
Report given to Meredith Pel, RN.

## 2020-08-16 ENCOUNTER — Other Ambulatory Visit: Payer: Self-pay | Admitting: Nurse Practitioner

## 2020-08-16 DIAGNOSIS — E876 Hypokalemia: Secondary | ICD-10-CM

## 2020-08-16 DIAGNOSIS — D696 Thrombocytopenia, unspecified: Secondary | ICD-10-CM

## 2020-08-16 DIAGNOSIS — E039 Hypothyroidism, unspecified: Secondary | ICD-10-CM

## 2020-08-16 DIAGNOSIS — D649 Anemia, unspecified: Secondary | ICD-10-CM

## 2020-08-16 DIAGNOSIS — I10 Essential (primary) hypertension: Secondary | ICD-10-CM

## 2020-08-16 DIAGNOSIS — C569 Malignant neoplasm of unspecified ovary: Secondary | ICD-10-CM

## 2020-08-16 DIAGNOSIS — K219 Gastro-esophageal reflux disease without esophagitis: Secondary | ICD-10-CM

## 2020-08-16 LAB — CBC
HCT: 25.9 % — ABNORMAL LOW (ref 36.0–46.0)
Hemoglobin: 8.5 g/dL — ABNORMAL LOW (ref 12.0–15.0)
MCH: 33.2 pg (ref 26.0–34.0)
MCHC: 32.8 g/dL (ref 30.0–36.0)
MCV: 101.2 fL — ABNORMAL HIGH (ref 80.0–100.0)
Platelets: 82 10*3/uL — ABNORMAL LOW (ref 150–400)
RBC: 2.56 MIL/uL — ABNORMAL LOW (ref 3.87–5.11)
RDW: 22.4 % — ABNORMAL HIGH (ref 11.5–15.5)
WBC: 3.1 10*3/uL — ABNORMAL LOW (ref 4.0–10.5)
nRBC: 0 % (ref 0.0–0.2)

## 2020-08-16 LAB — COMPREHENSIVE METABOLIC PANEL
ALT: 17 U/L (ref 0–44)
AST: 18 U/L (ref 15–41)
Albumin: 3.5 g/dL (ref 3.5–5.0)
Alkaline Phosphatase: 62 U/L (ref 38–126)
Anion gap: 10 (ref 5–15)
BUN: 5 mg/dL — ABNORMAL LOW (ref 8–23)
CO2: 24 mmol/L (ref 22–32)
Calcium: 7.9 mg/dL — ABNORMAL LOW (ref 8.9–10.3)
Chloride: 110 mmol/L (ref 98–111)
Creatinine, Ser: 0.41 mg/dL — ABNORMAL LOW (ref 0.44–1.00)
GFR, Estimated: >60 mL/min (ref >60)
Glucose, Bld: 92 mg/dL (ref 70–99)
Potassium: 3.6 mmol/L (ref 3.5–5.1)
Sodium: 144 mmol/L (ref 135–145)
Total Bilirubin: 0.4 mg/dL (ref 0.3–1.2)
Total Protein: 6.3 g/dL — ABNORMAL LOW (ref 6.5–8.1)

## 2020-08-16 LAB — TYPE AND SCREEN
ABO/RH(D): A POS
Antibody Screen: NEGATIVE
Unit division: 0

## 2020-08-16 LAB — PHOSPHORUS: Phosphorus: 2.7 mg/dL (ref 2.5–4.6)

## 2020-08-16 LAB — MAGNESIUM: Magnesium: 1.4 mg/dL — ABNORMAL LOW (ref 1.7–2.4)

## 2020-08-16 LAB — BPAM RBC
Blood Product Expiration Date: 202204212359
ISSUE DATE / TIME: 202203301242
Unit Type and Rh: 6200

## 2020-08-16 LAB — PROTIME-INR
INR: 2.1 — ABNORMAL HIGH (ref 0.8–1.2)
Prothrombin Time: 22.7 seconds — ABNORMAL HIGH (ref 11.4–15.2)

## 2020-08-16 MED ORDER — CALCIUM CARBONATE 1250 (500 CA) MG PO TABS
1.0000 | ORAL_TABLET | Freq: Three times a day (TID) | ORAL | Status: DC
  Administered 2020-08-16: 500 mg via ORAL
  Filled 2020-08-16: qty 1

## 2020-08-16 MED ORDER — POTASSIUM CHLORIDE 10 MEQ/100ML IV SOLN
10.0000 meq | INTRAVENOUS | Status: AC
  Administered 2020-08-16 (×4): 10 meq via INTRAVENOUS
  Filled 2020-08-16 (×4): qty 100

## 2020-08-16 MED ORDER — WARFARIN SODIUM 5 MG PO TABS
12.5000 mg | ORAL_TABLET | Freq: Once | ORAL | Status: AC
  Administered 2020-08-16: 12.5 mg via ORAL
  Filled 2020-08-16: qty 2

## 2020-08-16 MED ORDER — MAGNESIUM SULFATE 4 GM/100ML IV SOLN
4.0000 g | Freq: Once | INTRAVENOUS | Status: AC
  Administered 2020-08-16: 4 g via INTRAVENOUS
  Filled 2020-08-16: qty 100

## 2020-08-16 NOTE — Progress Notes (Signed)
PROGRESS NOTE    Donna [name]  BSJ:[id number] DOB: Feb 21, 1961 DOA: 08/15/2020 PCP: Susy Frizzle, MD   Chief Complaint  Patient presents with  . Abnormal Lab    Brief Narrative:  Patient 60 year old female history of ovarian cancer on chemotherapy, GERD, hyperlipidemia, hypertension who presented to the ED after being called by PCP for abnormal labs. On presentation patient noted to have a potassium of 2.9, magnesium of 0.9, calcium of 7.9.  Hemoglobin noted to be down to 7.4.  FOBT was negative. 1 unit of packed red blood cells ordered for transfusion.   Assessment & Plan:   Active Problems:   Symptomatic anemia   Hypokalemia   Hypomagnesemia   Hypocalcemia   Thrombocytopenia (HCC)   Hypertension   Malignant neoplasm of ovary (HCC)  #1 hypokalemia/hypomagnesemia/hypocalcemia ??  Questionable etiology.  Patient denies any nausea or vomiting or diarrhea. -Patient with complaints of generalized weakness. -Patient noted to have received 2 g of magnesium and 20 mEq of potassium in the ED. -Patient placed on scheduled potassium 40 mEq twice daily as well as calcium supplementation. -Magnesium currently at 1.4 today.  Potassium at 3.6, calcium is 7.9. -Check a phosphorus level, vitamin D level, PTH. -Magnesium sulfate 4 g IV x1. -KCl 10 mEq IV every 1 hour x4 hours as patient unable to tolerate oral potassium tablets no liquid potassium. -Continue vitamin D3 2000 units daily. -Add Os-Cal. -Keep magnesium greater than 2.  Keep potassium greater than 4. -IV fluids. -Supportive care.  2.  Hypothyroidism -Synthroid.  3.  Symptomatic anemia/thrombocytopenia -Patient with no overt bleeding. -Likely chemotherapy-induced. -Status post transfusion 1 unit packed red blood cells with hemoglobin currently 8.5 from 7.4 on admission.  Platelet count at 82. -Follow.  4.  Intermittent chest pain -EKG with no ischemic changes noted. -Cardiac enzymes negative -Currently chest  pain-free. -Outpatient follow-up.  5.  History of ovarian cancer -Outpatient follow-up with primary oncologist at [facility].  6.  Long-term anticoagulation use/history of aortic thrombus -INR at 2.1. -Coumadin being managed per pharmacy.  7.  Hyperlipidemia Statin.  8.  Gastroesophageal reflux disease PPI.  9.  Hypertension Stable.  Continue Norvasc and losartan.   DVT prophylaxis: Coumadin Code Status: Full Family Communication: Updated patient.  No family at bedside. Disposition:   Status is: Inpatient    Dispo: The patient is from: Home              Anticipated d/c is to: Home              Patient currently with electrolyte abnormalities which have been repleted.   Difficult to place patient no       Consultants:   None  Procedures:  Chest x-ray 08/15/2020  Bilateral ABIs with TBI 08/14/2020  Transfusion 1 unit packed red blood cells 08/15/2020  Antimicrobials:   None   Subjective: Patient sitting up in bed.  States difficulty swallowing potassium pills.  Denies any chest pain or shortness of breath.  Overall feeling a little bit better than when she came in however complain of generalized weakness.  Objective: Vitals:   08/15/20 2046 08/16/20 0509 08/16/20 1000 08/16/20 1351  BP: 113/69 (!) 108/53 131/69 126/75  Pulse: 68 65 74 68  Resp: 18 17  18   Temp: 97.8 F (36.6 C) (!) 97.5 F (36.4 C)  97.6 F (36.4 C)  TempSrc: Oral Oral Oral Oral  SpO2: 98% 94% 99% 97%  Weight:      Height:  No intake or output data in the 24 hours ending 08/16/20 1843 Filed Weights   08/15/20 0308  Weight: 110.2 kg    Examination:  General exam: Appears calm and comfortable.  Dry mucous membranes. Respiratory system: Clear to auscultation. Respiratory effort normal. Cardiovascular system: S1 & S2 heard, RRR. No JVD, murmurs, rubs, gallops or clicks. No pedal edema. Gastrointestinal system: Abdomen is nondistended, soft and nontender. No organomegaly or  masses felt. Normal bowel sounds heard. Central nervous system: Alert and oriented. No focal neurological deficits. Extremities: Symmetric 5 x 5 power. Skin: No rashes, lesions or ulcers Psychiatry: Judgement and insight appear normal. Mood & affect appropriate.     Data Reviewed: I have personally reviewed following labs and imaging studies  CBC: Recent Labs  Lab 08/15/20 0335 08/15/20 1841 08/16/20 0540  WBC 4.1  --  3.1*  NEUTROABS 1.9  --   --   HGB 7.4* 9.2* 8.5*  HCT 22.1* 27.7* 25.9*  MCV 106.8*  --  101.2*  PLT 88*  --  82*    Basic Metabolic Panel: Recent Labs  Lab 08/14/20 1545 08/15/20 0335 08/15/20 1841 08/16/20 0540 08/16/20 0556  NA 143 141 141 144  --   K 3.2* 2.9* 3.2* 3.6  --   CL 103 106 108 110  --   CO2 29 25 24 24   --   GLUCOSE 131* 103* 140* 92  --   BUN 9 9 6* 5*  --   CREATININE 0.61 0.66 0.57 0.41*  --   CALCIUM 8.0* 7.9* 8.2* 7.9*  --   MG 0.9* 1.0*  --   --  1.4*  PHOS  --   --  2.9 2.7  --     GFR: Estimated Creatinine Clearance: 94.4 mL/min (A) (by C-G formula based on SCr of 0.41 mg/dL (L)).  Liver Function Tests: Recent Labs  Lab 08/15/20 0335 08/15/20 1841 08/16/20 0540  AST 22  --  18  ALT 19  --  17  ALKPHOS 70  --  62  BILITOT 0.3  --  0.4  PROT 6.6  --  6.3*  ALBUMIN 3.9 3.9 3.5    CBG: No results for input(s): GLUCAP in the last 168 hours.   Recent Results (from the past 240 hour(s))  Resp Panel by RT-PCR (Flu A&B, Covid) Nasopharyngeal Swab     Status: None   Collection Time: 08/15/20  5:17 AM   Specimen: Nasopharyngeal Swab; Nasopharyngeal(NP) swabs in vial transport medium  Result Value Ref Range Status   SARS Coronavirus 2 by RT PCR NEGATIVE NEGATIVE Final    Comment: (NOTE) SARS-CoV-2 target nucleic acids are NOT DETECTED.  The SARS-CoV-2 RNA is generally detectable in upper respiratory specimens during the acute phase of infection. The lowest concentration of SARS-CoV-2 viral copies this assay can  detect is 138 copies/mL. A negative result does not preclude SARS-Cov-2 infection and should not be used as the sole basis for treatment or other patient management decisions. A negative result may occur with  improper specimen collection/handling, submission of specimen other than nasopharyngeal swab, presence of viral mutation(s) within the areas targeted by this assay, and inadequate number of viral copies(<138 copies/mL). A negative result must be combined with clinical observations, patient history, and epidemiological information. The expected result is Negative.  Fact Sheet for Patients:  EntrepreneurPulse.com.au  Fact Sheet for Healthcare Providers:  IncredibleEmployment.be  This test is no t yet approved or cleared by the [place] FDA and  has been  authorized for detection and/or diagnosis of SARS-CoV-2 by FDA under an Emergency Use Authorization (EUA). This EUA will remain  in effect (meaning this test can be used) for the duration of the COVID-19 declaration under Section 564(b)(1) of the Act, 21 U.S.C.section 360bbb-3(b)(1), unless the authorization is terminated  or revoked sooner.       Influenza A by PCR NEGATIVE NEGATIVE Final   Influenza B by PCR NEGATIVE NEGATIVE Final    Comment: (NOTE) The Xpert Xpress SARS-CoV-2/FLU/RSV plus assay is intended as an aid in the diagnosis of influenza from Nasopharyngeal swab specimens and should not be used as a sole basis for treatment. Nasal washings and aspirates are unacceptable for Xpert Xpress SARS-CoV-2/FLU/RSV testing.  Fact Sheet for Patients: EntrepreneurPulse.com.au  Fact Sheet for Healthcare Providers: IncredibleEmployment.be  This test is not yet approved or cleared by the [place] FDA and has been authorized for detection and/or diagnosis of SARS-CoV-2 by FDA under an Emergency Use Authorization (EUA). This EUA will remain in  effect (meaning this test can be used) for the duration of the COVID-19 declaration under Section 564(b)(1) of the Act, 21 U.S.C. section 360bbb-3(b)(1), unless the authorization is terminated or revoked.  Performed at [facility], [place] [address] [place],  [address]          Radiology Studies: DG Chest Portable 1 View  Result Date: 08/15/2020 CLINICAL DATA:  Chest pain EXAM: PORTABLE CHEST 1 VIEW COMPARISON:  07/27/2018 FINDINGS: The heart size and mediastinal contours are within normal limits. Both lungs are clear. Lung bases are not included in the field of view. The visualized skeletal structures are unremarkable. IMPRESSION: No active disease. Electronically Signed   By: Ulyses Jarred M.D.   On: 08/15/2020 03:47        Scheduled Meds: . amLODipine  10 mg Oral Daily  . atorvastatin  80 mg Oral Daily  . calcium carbonate  1 tablet Oral TID WC  . cholecalciferol  2,000 Units Oral Daily  . gabapentin  300 mg Oral BID  . leflunomide  20 mg Oral Daily  . levothyroxine  175 mcg Oral QAC breakfast  . losartan  50 mg Oral Daily  . pantoprazole  40 mg Oral Daily  . vitamin B-12  1,000 mcg Oral Daily  . Warfarin - Pharmacist Dosing Inpatient   Does not apply q1600   Continuous Infusions: . potassium chloride 10 mEq (08/16/20 1833)     LOS: 1 day    Time spent: 40 minutes    Irine Seal, MD [facility]   To contact the attending provider between 7A-7P or the covering provider during after hours 7P-7A, please log into the web site [email or web address] and access using universal [place] password for that web site. If you do not have the password, please call the hospital operator.  08/16/2020, 6:43 PM

## 2020-08-16 NOTE — Progress Notes (Signed)
ANTICOAGULATION CONSULT NOTE - follow up  Pharmacy Consult for Warfarin  Indication: arterial thrombosis   Allergies  Allergen Reactions  . Paclitaxel Other (See Comments)    Flushing, Itching, Throat tightness    Patient Measurements: Height: 5\' 7"  (170.2 cm) Weight: 110.2 kg (243 lb) IBW/kg (Calculated) : 61.6   Vital Signs: Temp: 97.5 F (36.4 C) (03/31 0509) Temp Source: Oral (03/31 0509) BP: 108/53 (03/31 0509) Pulse Rate: 65 (03/31 0509)  Labs: Recent Labs    08/15/20 0335 08/15/20 0524 08/15/20 1841 08/16/20 0540  HGB 7.4*  --  9.2* 8.5*  HCT 22.1*  --  27.7* 25.9*  PLT 88*  --   --  82*  LABPROT 20.7*  --   --  22.7*  INR 1.9*  --   --  2.1*  CREATININE 0.66  --  0.57 0.41*  CKTOTAL 189  --   --   --   TROPONINIHS 6 6  --   --     Estimated Creatinine Clearance: 94.4 mL/min (A) (by C-G formula based on SCr of 0.41 mg/dL (L)).   Medical History: Past Medical History:  Diagnosis Date  . Anxiety   . Arterial thrombosis (HCC)    right sfa, popliteal artery.  Require thromectomy at [facility] and lifelong anticoagulation.  . Cancer ([place])    ovarian, recurrent on chemo (carboplatin, doxorubicin) at [facility]  . Colon polyp   . Depression   . Full dentures   . GERD (gastroesophageal reflux disease)   . HLD (hyperlipidemia) 12/03/2012  . Hypertension   . Hypothyroidism   . IBS (irritable bowel syndrome)   . Neck mass   . Neuropathy    feet  . Rheumatoid arthritis ([place])    at [facility]  . Schatzki's ring   . Wears glasses     Medications:  PTA warfarin regimen: 12.5mg  PO daily-last dose reported as 3/29 at 930am   Assessment: 60 y/o F with PMH of ovarian cancer on chemo, GERD, HLD, HTN, arterial thrombosis on chronic warfarin therapy currently admitted due to electrolyte abnormalities. Pharmacy consulted for warfarin dosing while patient in the hospital. Admission INR slightly subtherapeutic at 1.9. Patient denies any missed doses. Hgb low/decreased to 7.4,  likely secondary to recent chemo. Given 1 unit PRBCs in ED. FOBT negative.    INR therapeutic  Hgb 7.4 > 9.2 > 8.5 - s/p blood transfusion 3/30  Plts 88 > 82  No reported bleeding  Goal of Therapy:  INR 2-3 Monitor platelets by anticoagulation protocol: Yes   Plan:   Repeat home dose of warfarin 12.5mg  today  Daily PT/INR  Monitor CBC closely and for s/sx of bleeding   Kara Mead 08/16/2020,9:55 AM

## 2020-08-17 LAB — RENAL FUNCTION PANEL
Albumin: 3.6 g/dL (ref 3.5–5.0)
Anion gap: 8 (ref 5–15)
BUN: 6 mg/dL — ABNORMAL LOW (ref 8–23)
CO2: 21 mmol/L — ABNORMAL LOW (ref 22–32)
Calcium: 8.1 mg/dL — ABNORMAL LOW (ref 8.9–10.3)
Chloride: 111 mmol/L (ref 98–111)
Creatinine, Ser: 0.58 mg/dL (ref 0.44–1.00)
GFR, Estimated: >60 mL/min (ref >60)
Glucose, Bld: 92 mg/dL (ref 70–99)
Phosphorus: 2.3 mg/dL — ABNORMAL LOW (ref 2.5–4.6)
Potassium: 3.8 mmol/L (ref 3.5–5.1)
Sodium: 140 mmol/L (ref 135–145)

## 2020-08-17 LAB — CBC
HCT: 27.5 % — ABNORMAL LOW (ref 36.0–46.0)
Hemoglobin: 8.8 g/dL — ABNORMAL LOW (ref 12.0–15.0)
MCH: 33.6 pg (ref 26.0–34.0)
MCHC: 32 g/dL (ref 30.0–36.0)
MCV: 105 fL — ABNORMAL HIGH (ref 80.0–100.0)
Platelets: 86 10*3/uL — ABNORMAL LOW (ref 150–400)
RBC: 2.62 MIL/uL — ABNORMAL LOW (ref 3.87–5.11)
RDW: 21.4 % — ABNORMAL HIGH (ref 11.5–15.5)
WBC: 3.2 10*3/uL — ABNORMAL LOW (ref 4.0–10.5)
nRBC: 0 % (ref 0.0–0.2)

## 2020-08-17 LAB — PROTIME-INR
INR: 2.2 — ABNORMAL HIGH (ref 0.8–1.2)
Prothrombin Time: 23.6 seconds — ABNORMAL HIGH (ref 11.4–15.2)

## 2020-08-17 LAB — VITAMIN D 25 HYDROXY (VIT D DEFICIENCY, FRACTURES): Vit D, 25-Hydroxy: 57.25 ng/mL (ref 30–100)

## 2020-08-17 LAB — MAGNESIUM: Magnesium: 1.9 mg/dL (ref 1.7–2.4)

## 2020-08-17 MED ORDER — PANTOPRAZOLE SODIUM 40 MG PO TBEC: 40.0000 mg | DELAYED_RELEASE_TABLET | Freq: Every day | ORAL | 1 refills | Status: DC

## 2020-08-17 MED ORDER — CALCIUM CARBONATE 1250 (500 CA) MG PO TABS: 1.0000 | ORAL_TABLET | Freq: Three times a day (TID) | ORAL | 0 refills | Status: DC

## 2020-08-17 MED ORDER — POTASSIUM CHLORIDE 10 MEQ/100ML IV SOLN
10.0000 meq | INTRAVENOUS | Status: AC
  Administered 2020-08-17 (×2): 10 meq via INTRAVENOUS
  Filled 2020-08-17 (×2): qty 100

## 2020-08-17 MED ORDER — CALCIUM CARBONATE 1250 (500 CA) MG PO TABS
2.0000 | ORAL_TABLET | Freq: Three times a day (TID) | ORAL | Status: DC
  Administered 2020-08-17: 1000 mg via ORAL

## 2020-08-17 MED ORDER — CALCIUM CARBONATE 1250 (500 CA) MG PO TABS: 1.0000 | ORAL_TABLET | Freq: Three times a day (TID) | ORAL | Status: DC

## 2020-08-17 MED ORDER — MAGNESIUM SULFATE 2 GM/50ML IV SOLN
2.0000 g | Freq: Once | INTRAVENOUS | Status: AC
  Administered 2020-08-17: 2 g via INTRAVENOUS
  Filled 2020-08-17: qty 50

## 2020-08-17 MED ORDER — K PHOS MONO-SOD PHOS DI & MONO 155-852-130 MG PO TABS
500.0000 mg | ORAL_TABLET | ORAL | Status: AC
  Administered 2020-08-17: 500 mg via ORAL
  Filled 2020-08-17: qty 2

## 2020-08-17 MED ORDER — WARFARIN SODIUM 5 MG PO TABS: 12.5000 mg | ORAL_TABLET | Freq: Once | ORAL | Status: DC

## 2020-08-17 NOTE — Progress Notes (Signed)
ANTICOAGULATION CONSULT NOTE - follow up  Pharmacy Consult for Warfarin  Indication: arterial thrombosis   Allergies  Allergen Reactions  . Paclitaxel Other (See Comments)    Flushing, Itching, Throat tightness    Patient Measurements: Height: 5\' 7"  (170.2 cm) Weight: 110.2 kg (243 lb) IBW/kg (Calculated) : 61.6   Vital Signs: Temp: 97.6 F (36.4 C) (04/01 0531) Temp Source: Oral (04/01 0531) BP: 125/69 (04/01 0531) Pulse Rate: 64 (04/01 0531)  Labs: Recent Labs    08/15/20 0335 08/15/20 0524 08/15/20 1841 08/16/20 0540 08/17/20 0516  HGB 7.4*  --  9.2* 8.5* 8.8*  HCT 22.1*  --  27.7* 25.9* 27.5*  PLT 88*  --   --  82* 86*  LABPROT 20.7*  --   --  22.7* 23.6*  INR 1.9*  --   --  2.1* 2.2*  CREATININE 0.66  --  0.57 0.41* 0.58  CKTOTAL 189  --   --   --   --   TROPONINIHS 6 6  --   --   --     Estimated Creatinine Clearance: 94.4 mL/min (by C-G formula based on SCr of 0.58 mg/dL).   Medical History: Past Medical History:  Diagnosis Date  . Anxiety   . Arterial thrombosis (HCC)    right sfa, popliteal artery.  Require thromectomy at [facility] and lifelong anticoagulation.  . Cancer ([place])    ovarian, recurrent on chemo (carboplatin, doxorubicin) at [facility]  . Colon polyp   . Depression   . Full dentures   . GERD (gastroesophageal reflux disease)   . HLD (hyperlipidemia) 12/03/2012  . Hypertension   . Hypothyroidism   . IBS (irritable bowel syndrome)   . Neck mass   . Neuropathy    feet  . Rheumatoid arthritis ([place])    at [facility]  . Schatzki's ring   . Wears glasses     Medications:  PTA warfarin regimen: 12.5mg  PO daily-last dose reported as 3/29 at 930am   Assessment: 60 y/o F with PMH of ovarian cancer on chemo, GERD, HLD, HTN, arterial thrombosis on chronic warfarin therapy currently admitted due to electrolyte abnormalities. Pharmacy consulted for warfarin dosing while patient in the hospital. Admission INR slightly subtherapeutic at 1.9. Patient denies  any missed doses. Anemia/thrombocytopenia likely secondary to recent chemo. Given 1 unit PRBCs in ED. FOBT negative.   Today, 08/17/20:  INR = 2.2, therapeutic  Hgb remains low/stable at 8.8 (s/p blood transfusion 3/30)  Plts low/stable this admission in 80s  No reported bleeding  Goal of Therapy:  INR 2-3 Monitor platelets by anticoagulation protocol: Yes   Plan:   Repeat home dose of warfarin 12.5mg  PO x 1 today  Daily PT/INR  Monitor CBC closely and for s/sx of bleeding   Lindell Spar, PharmD, BCPS 08/17/2020,10:43 AM

## 2020-08-17 NOTE — Discharge Summary (Addendum)
Physician Discharge Summary  Donna [name] TJQ:[id number] DOB: 1960/10/15 DOA: 08/15/2020  PCP: Susy Frizzle, MD  Admit date: 08/15/2020 Discharge date: 08/17/2020  Time spent: 60 minutes  Recommendations for Outpatient Follow-up:  1. Follow-up with Susy Frizzle, MD in 1 week.  On follow-up patient will need a basic metabolic profile, phosphorus level, magnesium level, CBC done to follow-up on electrolytes, renal function, hemoglobin and platelet count.  Patient's blood pressure need to be reassessed on follow-up.  PTH levels which were drawn during the hospitalization will need to be followed up upon.   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 Len Childs is a 60 y.o. female with medical history significant of ovarian cancer on chemo, GERD, HLD, HTN. Presenting after being called by her PCP for lab abnormalities. She stated that she saw her PCP for regularly scheduled lab draws. She was called at 0130hrs the morning of admission, by her PCP team. They informed her that he magnesium was critically low and that she needed to go to the ED. She reported that she has had some intermittent chest pressure that seems to radiate to her back between the shoulders. She reported a history of atypical angina and says these symptoms are similar to those that appear during times of stress. Otherwise, she denies any alleviating or aggravating factors.      ED Course: Her K+ was 2.9 and her Mg2+ was 1.0. Her Hgb was 7.4. This is down from 9.4 on 07/27/20. She was FOBT negative. She had K+, Mg2+, and pRBCs ordered. TR[facility] was called for admission.   Hospital Course:  1  hypokalemia/hypomagnesemia/hypocalcemia ??  Questionable etiology.    Likely secondary to Lasix/diuretics that patient was on prior to admission which was recently discontinued per med rec.   -Patient denied any nausea or vomiting or diarrhea. -Patient with complaints of generalized weakness. -Patient noted to have received 2 g of magnesium and 20 mEq of potassium in the ED. -Patient placed on scheduled potassium 40 mEq twice daily as well as calcium supplementation. -Magnesium repleted during the hospitalization such that by day of discharge magnesium was 1.9 from 0.9 on admission.  Patient received a dose of magnesium sulfate 2 g IV x1 on day of discharge.   -Patient's potassium level/hypokalemia was repleted station and potassium was up to 3.8 by day of discharge.  Calcium noted at 8.1.  -Vitamin D 25 hydroxy level obtained was 57.25. -Phosphorus noted at 2.3 by day of discharge. -PTH obtained was pending at time of discharge. -Patient was maintained on vitamin D3 2000 units daily as previously taking prior to admission and patient started on Os-Cal 1 tablet 3 times daily. -Patient hydrated with IV fluids -Patient improved clinically will be discharged home in stable and improved condition. -Patient was given oral phosphorus prior to discharge. -Outpatient follow-up with PCP.  2.  Hypothyroidism -Patient maintained on home regimen of Synthroid.  3.  Symptomatic anemia/thrombocytopenia -Patient with no overt bleeding. -Likely chemotherapy-induced. -Status post transfusion 1 unit packed red blood cells with hemoglobin at 8.8 by day of discharge from 7.4 on admission.  Platelet count noted at 86 on day  of discharge.  -Outpatient follow-up with PCP and oncologist.   4.  Intermittent chest pain -EKG with no ischemic changes noted. -Cardiac enzymes negative -Patient remained chest pain-free during the hospitalization.   -Outpatient follow-up.   5.  History of ovarian  cancer -Outpatient follow-up with primary oncologist at [facility].  6.  Long-term anticoagulation use/history of aortic thrombus -INR at 2.2 on day of discharge. -Coumadin was managed per pharmacy during the hospitalization and patient be discharged back on home regimen of Coumadin.    7.  Hyperlipidemia Patient maintained on statin.  8.  Gastroesophageal reflux disease Patient maintained on PPI.  9.  Hypertension -Remained stable during the hospitalization.   -Patient maintained on home regimen Norvasc and losartan.  Patient's home regimen of diuretics had been discontinued by PCP prior to admission. -Outpatient follow-up with PCP per  Procedures:  Chest x-ray 08/15/2020  Bilateral ABIs with TBI 08/14/2020  Transfusion 1 unit packed red blood cells 08/15/2020    Consultations:  None  Discharge Exam: Vitals:   08/17/20 0531 08/17/20 1335  BP: 125/69 118/70  Pulse: 64 63  Resp: 16 18  Temp: 97.6 F (36.4 C) 97.6 F (36.4 C)  SpO2: 96% 98%    General: NAD Cardiovascular: RRR Respiratory: CTAB  Discharge Instructions   Discharge Instructions    Diet - low sodium heart healthy   Complete by: As directed    Increase activity slowly   Complete by: As directed      Allergies as of 08/17/2020      Reactions   Paclitaxel Other (See Comments)   Flushing, Itching, Throat tightness      Medication List    STOP taking these medications   furosemide 20 MG tablet Commonly known as: LASIX   potassium chloride SA 20 MEQ tablet Commonly known as: KLOR-CON     TAKE these medications   amLODipine 10 MG tablet Commonly known as: NORVASC Take 1 tablet by mouth once daily   atorvastatin 40 MG tablet Commonly known as: LIPITOR Take 2 tablets by mouth once daily   calcium carbonate 1250 (500 Ca) MG tablet Commonly known as: OS-CAL - dosed in mg of elemental calcium Take 1 tablet (500 mg of elemental calcium total) by mouth 3 (three) times daily with meals.    dexamethasone 4 MG tablet Commonly known as: DECADRON Take 4 mg by mouth See admin instructions. Takes 2 tablets 2 days after chemo possibly - pt unsure   diphenhydrAMINE 25 MG tablet Commonly known as: BENADRYL Take 25 mg by mouth at bedtime as needed. For sleep   gabapentin 300 MG capsule Commonly known as: NEURONTIN Take 1 capsule (300 mg total) by mouth 2 (two) times daily.   leflunomide 20 MG tablet Commonly known as: ARAVA Take 20 mg by mouth daily.   levothyroxine 175 MCG tablet Commonly known as: SYNTHROID TAKE 1 TABLET BY MOUTH ONCE DAILY BEFORE BREAKFAST **DISCONTINUE  150  MCG  LEVOTHYROXINE**   losartan 50 MG tablet Commonly known as: COZAAR Take 1 tablet by mouth once daily   OLANZapine 5 MG tablet Commonly known as: ZYPREXA Take 1 tablet by mouth nightly for 4 nights, starting on the day of chemotherapy each cycle.   omeprazole 40 MG capsule Commonly known as: PRILOSEC Take 1 capsule (40 mg total) by mouth in the morning and at bedtime.   ondansetron 8 MG tablet Commonly known as: ZOFRAN Take 8 mg by mouth daily as needed for nausea, vomiting or refractory nausea / vomiting.  prochlorperazine 10 MG tablet Commonly known as: COMPAZINE Take 1 tablet (10 mg) by mouth every six hours as needed for nausea if not controlled with other nausea medicines.   vitamin B-12 1000 MCG tablet Commonly known as: CYANOCOBALAMIN Take 1,000 mcg by mouth daily.   Vitamin D3 50 MCG (2000 UT) capsule Take 2,000 Units by mouth daily.   WARFARIN SODIUM PO Take 12.5 mg by mouth daily.   warfarin 5 MG tablet Commonly known as: COUMADIN TAKE 1 & 1/2 (ONE & ONE-HALF) TABLETS BY MOUTH ONCE DAILY ON TUE, THURS, SAT, & SUN. THEN TAKE 2 TABLETS BY MOUTH ONCE DAILY ON OTHER DAYS      Allergies  Allergen Reactions  . Paclitaxel Other (See Comments)    Flushing, Itching, Throat tightness    Follow-up Information    Susy Frizzle, MD. Schedule an appointment as soon as  possible for a visit in 1 week(s).   Specialty: Family Medicine Contact information: [address] [place] [address] [phone]                The results of significant diagnostics from this hospitalization (including imaging, microbiology, ancillary and laboratory) are listed below for reference.    Significant Diagnostic Studies: MR ANKLE RIGHT WO CONTRAST  Result Date: 07/21/2020 CLINICAL DATA:  Right ankle pain and instability for 5 months. No known injury. EXAM: MRI OF THE RIGHT ANKLE WITHOUT CONTRAST TECHNIQUE: Multiplanar, multisequence MR imaging of the ankle was performed. No intravenous contrast was administered. COMPARISON:  Plain films right foot 05/09/2020. FINDINGS: TENDONS Peroneal: Intact. There is a small volume of fluid in the sheaths of the tendons. Posteromedial: Intact. There is a small volume of fluid in the sheaths of the tendons. Anterior: Intact. Achilles: Intact. Plantar Fascia: Intact. Signal within the plantar fascia is normal without evidence of plantar fasciitis. LIGAMENTS Lateral: There is a complete tear of the anterior talofibular ligament. Otherwise intact. Medial: Intact. CARTILAGE Ankle Joint: No osteochondral lesion of the talar dome or joint effusion. Subtalar Joints/Sinus Tarsi: Fat signal within the sinus tarsi is partially obliterated. Bones: Infarcts are seen in virtually all imaged bones. Largest infarcts involve the entirety of the calcaneus, navicular and cuboid bones. No fragmentation or collapse. No fracture. Other: None. IMPRESSION: Infarcts in virtually all imaged bones without fragmentation or collapse. Largest infarcts involve the entirety of the calcaneus, navicular and cuboid. Tear of the anterior talofibular ligament appears chronic. Small volume of fluid in the sheaths of the peroneal and posteromedial tendons compatible with tenosynovitis. No tendon tear. 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 Electronically Signed   By: Inge Rise M.D.   On: 07/21/2020 10:19   DG Chest Portable 1 View  Result Date: 08/15/2020 CLINICAL DATA:  Chest pain EXAM: PORTABLE CHEST 1 VIEW COMPARISON:  07/27/2018 FINDINGS: The heart size and mediastinal contours are within normal limits. Both lungs are clear. Lung bases are not included in the field of view. The visualized skeletal structures are unremarkable. IMPRESSION: No active disease. Electronically Signed   By: Ulyses Jarred M.D.   On: 08/15/2020 03:47   VAS [place] ABI WITH/WO TBI  Result Date: 08/14/2020 LOWER EXTREMITY DOPPLER STUDY Indications: Right foot and ankle pain. Patient complains of difficulty walking              due to foot pain. High Risk Factors: Hypertension, hyperlipidemia, past history of smoking.  Vascular Interventions: History of arterial thromboembolism  and thrombectomy of                         the right lower extremity. Aortic stent placement and                         right SFA, popliteal and tibial artery thrombectomies at                         [facility] in 08/2018. Performing Technologist: Ronal Fear RVS, RCS  Examination Guidelines: A complete evaluation includes at minimum, Doppler waveform signals and systolic blood pressure reading at the level of bilateral brachial, anterior tibial, and posterior tibial arteries, when vessel segments are accessible. Bilateral testing is considered an integral part of a complete examination. Photoelectric Plethysmograph (PPG) waveforms and toe systolic pressure readings are included as required and additional duplex testing as needed. Limited examinations for reoccurring indications may be performed as noted.  ABI Findings: +---------+------------------+-----+---------+--------+ Right    Rt Pressure (mmHg)IndexWaveform Comment  +---------+------------------+-----+---------+--------+ Brachial 155                                       +---------+------------------+-----+---------+--------+ PTA      165               1.06 triphasic         +---------+------------------+-----+---------+--------+ DP       166               1.07 triphasic         +---------+------------------+-----+---------+--------+ Great Toe140               0.90                   +---------+------------------+-----+---------+--------+ +---------+------------------+-----+---------+-------+ Left     Lt Pressure (mmHg)IndexWaveform Comment +---------+------------------+-----+---------+-------+ Brachial 155                                     +---------+------------------+-----+---------+-------+ PTA      172               1.11 triphasic        +---------+------------------+-----+---------+-------+ DP       176               1.14 triphasic        +---------+------------------+-----+---------+-------+ Great Toe139               0.90                  +---------+------------------+-----+---------+-------+ +-------+-----------+-----------+------------+------------+ ABI/TBIToday's ABIToday's TBIPrevious ABIPrevious TBI +-------+-----------+-----------+------------+------------+ Right  1.07       0.90       1.15        0.59         +-------+-----------+-----------+------------+------------+ Left   1.14       0.90       1.08        1.51         +-------+-----------+-----------+------------+------------+  Bilateral ABIs appear essentially unchanged compared to prior study on 12/15/2018.  Summary: Right: Resting right ankle-brachial index is within normal range. No evidence of significant right lower extremity arterial disease. The right toe-brachial index is normal. Left: Resting left ankle-brachial index is within normal  range. No evidence of significant left lower extremity arterial disease. The left toe-brachial index is normal.  *See table(s) above for measurements and observations.  Electronically signed by Jamelle Haring  on 08/14/2020 at 3:43:24 PM.    Final    DG Hip Unilat W OR W/O Pelvis 2-3 Views Right  Result Date: 08/02/2020 CLINICAL DATA:  Right hip pain EXAM: DG HIP (WITH OR WITHOUT PELVIS) 2-3V RIGHT COMPARISON:  None. FINDINGS: There is no evidence of hip fracture or dislocation. Mild-moderate osteoarthritis of the right hip as manifested by joint space narrowing, subchondral sclerosis/cystic change, and marginal osteophyte formation. IMPRESSION: Mild-moderate osteoarthritis of the right hip. Electronically Signed   By: Davina Poke D.O.   On: 08/02/2020 09:17    Microbiology: Recent Results (from the past 240 hour(s))  Resp Panel by RT-PCR (Flu A&B, Covid) Nasopharyngeal Swab     Status: None   Collection Time: 08/15/20  5:17 AM   Specimen: Nasopharyngeal Swab; Nasopharyngeal(NP) swabs in vial transport medium  Result Value Ref Range Status   SARS Coronavirus 2 by RT PCR NEGATIVE NEGATIVE Final    Comment: (NOTE) SARS-CoV-2 target nucleic acids are NOT DETECTED.  The SARS-CoV-2 RNA is generally detectable in upper respiratory specimens during the acute phase of infection. The lowest concentration of SARS-CoV-2 viral copies this assay can detect is 138 copies/mL. A negative result does not preclude SARS-Cov-2 infection and should not be used as the sole basis for treatment or other patient management decisions. A negative result may occur with  improper specimen collection/handling, submission of specimen other than nasopharyngeal swab, presence of viral mutation(s) within the areas targeted by this assay, and inadequate number of viral copies(<138 copies/mL). A negative result must be combined with clinical observations, patient history, and epidemiological information. The expected result is Negative.  Fact Sheet for Patients:  EntrepreneurPulse.com.au  Fact Sheet for Healthcare Providers:  IncredibleEmployment.be  This test is no t yet approved or  cleared by the [place] FDA and  has been authorized for detection and/or diagnosis of SARS-CoV-2 by FDA under an Emergency Use Authorization (EUA). This EUA will remain  in effect (meaning this test can be used) for the duration of the COVID-19 declaration under Section 564(b)(1) of the Act, 21 U.S.C.section 360bbb-3(b)(1), unless the authorization is terminated  or revoked sooner.       Influenza A by PCR NEGATIVE NEGATIVE Final   Influenza B by PCR NEGATIVE NEGATIVE Final    Comment: (NOTE) The Xpert Xpress SARS-CoV-2/FLU/RSV plus assay is intended as an aid in the diagnosis of influenza from Nasopharyngeal swab specimens and should not be used as a sole basis for treatment. Nasal washings and aspirates are unacceptable for Xpert Xpress SARS-CoV-2/FLU/RSV testing.  Fact Sheet for Patients: EntrepreneurPulse.com.au  Fact Sheet for Healthcare Providers: IncredibleEmployment.be  This test is not yet approved or cleared by the [place] FDA and has been authorized for detection and/or diagnosis of SARS-CoV-2 by FDA under an Emergency Use Authorization (EUA). This EUA will remain in effect (meaning this test can be used) for the duration of the COVID-19 declaration under Section 564(b)(1) of the Act, 21 U.S.C. section 360bbb-3(b)(1), unless the authorization is terminated or revoked.  Performed at [facility], [place] [address], [place], [place] [address]      Labs: Basic Metabolic Panel: Recent Labs  Lab 08/14/20 1545 08/15/20 0335 08/15/20 1841 08/16/20 0540 08/16/20 0556 08/17/20 0516  NA 143 141 141 144  --  140  K 3.2* 2.9*  3.2* 3.6  --  3.8  CL 103 106 108 110  --  111  CO2 29 25 24 24   --  21*  GLUCOSE 131* 103* 140* 92  --  92  BUN 9 9 6* 5*  --  6*  CREATININE 0.61 0.66 0.57 0.41*  --  0.58  CALCIUM 8.0* 7.9* 8.2* 7.9*  --  8.1*  MG 0.9* 1.0*  --   --  1.4* 1.9  PHOS  --   --  2.9 2.7  --   2.3*   Liver Function Tests: Recent Labs  Lab 08/15/20 0335 08/15/20 1841 08/16/20 0540 08/17/20 0516  AST 22  --  18  --   ALT 19  --  17  --   ALKPHOS 70  --  62  --   BILITOT 0.3  --  0.4  --   PROT 6.6  --  6.3*  --   ALBUMIN 3.9 3.9 3.5 3.6   No results for input(s): LIPASE, AMYLASE in the last 168 hours. No results for input(s): AMMONIA in the last 168 hours. CBC: Recent Labs  Lab 08/15/20 0335 08/15/20 1841 08/16/20 0540 08/17/20 0516  WBC 4.1  --  3.1* 3.2*  NEUTROABS 1.9  --   --   --   HGB 7.4* 9.2* 8.5* 8.8*  HCT 22.1* 27.7* 25.9* 27.5*  MCV 106.8*  --  101.2* 105.0*  PLT 88*  --  82* 86*   Cardiac Enzymes: Recent Labs  Lab 08/15/20 0335  CKTOTAL 189   BNP: BNP (last 3 results) No results for input(s): BNP in the last 8760 hours.  ProBNP (last 3 results) No results for input(s): PROBNP in the last 8760 hours.  CBG: No results for input(s): GLUCAP in the last 168 hours.     Signed:  Irine Seal MD.  [facility] 08/17/2020, 3:50 PM

## 2020-08-18 LAB — PTH, INTACT AND CALCIUM
Calcium, Total (PTH): 7.9 mg/dL — ABNORMAL LOW (ref 8.7–10.3)
PTH: 60 pg/mL (ref 15–65)

## 2020-08-20 ENCOUNTER — Telehealth: Payer: Self-pay | Admitting: *Deleted

## 2020-08-20 NOTE — Telephone Encounter (Signed)
Transition Care Management Follow-up Telephone Call  Date of discharge and from where: 08/18/2020 - [facility]   How have you been since you were released from the hospital? "I am okay"  Any questions or concerns? No  Items Reviewed:  Did the pt receive and understand the discharge instructions provided? Yes   Medications obtained and verified? Yes   Other? No   Any new allergies since your discharge? No   Dietary orders reviewed? No  Do you have support at home? Yes    Functional Questionnaire: (I = Independent and D = Dependent) ADLs: I  Bathing/Dressing- I  Meal Prep- I  Eating- I  Maintaining continence- I  Transferring/Ambulation- I  Managing Meds- I  Follow up appointments reviewed:   PCP Hospital f/u appt confirmed? Yes  Scheduled to see Dr. Dennard Schaumann on 08/23/2020 @ 1530.  [facility] f/u appt confirmed? No    Are transportation arrangements needed? No   If their condition worsens, is the pt aware to call PCP or go to the Emergency Dept.? Yes  Was the patient provided with contact information for the PCP's office or ED? Yes  Was to pt encouraged to call back with questions or concerns? Yes

## 2020-08-21 ENCOUNTER — Other Ambulatory Visit: Payer: Self-pay | Admitting: *Deleted

## 2020-08-23 ENCOUNTER — Ambulatory Visit (INDEPENDENT_AMBULATORY_CARE_PROVIDER_SITE_OTHER): Payer: Managed Care, Other (non HMO) | Admitting: Family Medicine

## 2020-08-23 ENCOUNTER — Other Ambulatory Visit: Payer: Self-pay

## 2020-08-23 VITALS — BP 128/68 | HR 74 | Temp 98.4°F | Resp 16 | Ht 67.0 in | Wt 240.0 lb

## 2020-08-23 DIAGNOSIS — I749 Embolism and thrombosis of unspecified artery: Secondary | ICD-10-CM

## 2020-08-23 DIAGNOSIS — D696 Thrombocytopenia, unspecified: Secondary | ICD-10-CM

## 2020-08-23 DIAGNOSIS — E876 Hypokalemia: Secondary | ICD-10-CM

## 2020-08-23 LAB — PT WITH INR/FINGERSTICK
INR, fingerstick: 3.5 ratio — ABNORMAL HIGH
PT, fingerstick: 41.4 s — ABNORMAL HIGH (ref 10.5–13.1)

## 2020-08-23 MED ORDER — POTASSIUM CHLORIDE CRYS ER 20 MEQ PO TBCR: 40.0000 meq | EXTENDED_RELEASE_TABLET | Freq: Every day | ORAL | 3 refills | Status: DC

## 2020-08-23 NOTE — Progress Notes (Signed)
Subjective:    Patient ID: Donna [name], female    DOB: 1961-04-06, 60 y.o.   MRN: [id number]  Patient was recently admitted to the hospital with hypokalemia, hypocalcemia, and hypomagnesemia.  Magnesium was 0.9.  She was given 2 g of magnesium IV in the hospital.  She was also found to have hypokalemia and was given K. Dur while in the hospital along with starting Os-Cal 500 mg 3 times daily.  She was also given a transfusion due to symptomatic anemia with a hemoglobin of 7.5.  She is here today for follow-up.  She saw her rheumatologist today who has requested a sed rate and a CRP which I will be happy to draw.  Also rechecked her INR which is supratherapeutic at 3.5.  She is taking Coumadin 12.5 mg every day.  She denies any bleeding or bruising.  Overall she reports feeling fatigue but she denies any palpitations.  She has recently started having muscle cramps yesterday in her legs. Past Medical History:  Diagnosis Date  . Anxiety   . Arterial thrombosis (HCC)    right sfa, popliteal artery.  Require thromectomy at [facility] and lifelong anticoagulation.  . Cancer ([place])    ovarian, recurrent on chemo (carboplatin, doxorubicin) at [facility]  . Colon polyp   . Depression   . Full dentures   . GERD (gastroesophageal reflux disease)   . HLD (hyperlipidemia) 12/03/2012  . Hypertension   . Hypothyroidism   . IBS (irritable bowel syndrome)   . Neck mass   . Neuropathy    feet  . Rheumatoid arthritis ([place])    at [facility]  . Schatzki's ring   . Wears glasses    Past Surgical History:  Procedure Laterality Date  . ABDOMINAL HYSTERECTOMY    . ABLATION    . COLONOSCOPY    . CYST REMOVAL NECK  05/13/12  . EAR CYST EXCISION  05/13/2012   Procedure: CYST REMOVAL;  Surgeon: Ralene Ok, MD;  Location: [place];  Service: General;  Laterality: Left;  excision of neck cyst  . THYROID SURGERY  10/2011 - approximate   ablation  . TONSILLECTOMY    . UPPER GI ENDOSCOPY     Current Outpatient Medications  on File Prior to Visit  Medication Sig Dispense Refill  . amLODipine (NORVASC) 10 MG tablet Take 1 tablet by mouth once daily 90 tablet 0  . atorvastatin (LIPITOR) 40 MG tablet Take 2 tablets by mouth once daily 180 tablet 0  . calcium carbonate (OS-CAL - DOSED IN MG OF ELEMENTAL CALCIUM) 1250 (500 Ca) MG tablet Take 1 tablet (500 mg of elemental calcium total) by mouth 3 (three) times daily with meals. 90 tablet 0  . Cholecalciferol (VITAMIN D3) 50 MCG (2000 UT) capsule Take 2,000 Units by mouth daily.    Marland Kitchen dexamethasone (DECADRON) 4 MG tablet Take 4 mg by mouth See admin instructions. Takes 2 tablets 2 days after chemo possibly - pt unsure    . diphenhydrAMINE (BENADRYL) 25 MG tablet Take 25 mg by mouth at bedtime as needed. For sleep    . gabapentin (NEURONTIN) 300 MG capsule Take 1 capsule (300 mg total) by mouth 2 (two) times daily. 60 capsule 11  . leflunomide (ARAVA) 20 MG tablet Take 20 mg by mouth daily.    Marland Kitchen levothyroxine (SYNTHROID) 175 MCG tablet TAKE 1 TABLET BY MOUTH ONCE DAILY BEFORE BREAKFAST **DISCONTINUE  150  MCG  LEVOTHYROXINE** 90 tablet 0  . losartan (COZAAR) 50 MG tablet Take  1 tablet by mouth once daily 90 tablet 0  . OLANZapine (ZYPREXA) 5 MG tablet Take 1 tablet by mouth nightly for 4 nights, starting on the day of chemotherapy each cycle.    Marland Kitchen omeprazole (PRILOSEC) 40 MG capsule Take 1 capsule (40 mg total) by mouth in the morning and at bedtime. 60 capsule 3  . ondansetron (ZOFRAN) 8 MG tablet Take 8 mg by mouth daily as needed for nausea, vomiting or refractory nausea / vomiting.    . prochlorperazine (COMPAZINE) 10 MG tablet Take 1 tablet (10 mg) by mouth every six hours as needed for nausea if not controlled with other nausea medicines.    . vitamin B-12 (CYANOCOBALAMIN) 1000 MCG tablet Take 1,000 mcg by mouth daily.    Marland Kitchen warfarin (COUMADIN) 5 MG tablet TAKE 1 & 1/2 (ONE & ONE-HALF) TABLETS BY MOUTH ONCE DAILY ON TUE, THURS, SAT, & SUN. THEN TAKE 2 TABLETS BY MOUTH  ONCE DAILY ON OTHER DAYS 135 tablet 2  . WARFARIN SODIUM PO Take 12.5 mg by mouth daily.     No current facility-administered medications on file prior to visit.   Allergies  Allergen Reactions  . Paclitaxel Other (See Comments)    Flushing, Itching, Throat tightness   Social History   Socioeconomic History  . Marital status: Single    Spouse name: Not on file  . Number of children: Not on file  . Years of education: Not on file  . Highest education level: Not on file  Occupational History  . Not on file  Tobacco Use  . Smoking status: Former Smoker    Years: 32.00    Quit date: 06/19/2011    Years since quitting: 9.1  . Smokeless tobacco: Never Used  Vaping Use  . Vaping Use: Never used  Substance and Sexual Activity  . Alcohol use: No  . Drug use: No  . Sexual activity: Yes  Other Topics Concern  . Not on file  Social History Narrative  . Not on file   Social Determinants of Health   Financial Resource Strain: Not on file  Food Insecurity: Not on file  Transportation Needs: Not on file  Physical Activity: Not on file  Stress: Not on file  Social Connections: Not on file  Intimate Partner Violence: Not on file      Review of Systems  All other systems reviewed and are negative.      Objective:   Physical Exam Vitals reviewed.  Constitutional:      Appearance: She is normal weight.  Cardiovascular:     Rate and Rhythm: Normal rate and regular rhythm.     Heart sounds: Normal heart sounds.  Pulmonary:     Effort: Pulmonary effort is normal.     Breath sounds: Normal breath sounds.  Musculoskeletal:     Right lower leg: No edema.     Left lower leg: No edema.  Neurological:     Mental Status: She is alert.           Assessment & Plan:  Hypokalemia - Plan: COMPLETE METABOLIC PANEL WITH GFR, CBC with Differential/Platelet, Magnesium  Hypomagnesemia - Plan: COMPLETE METABOLIC PANEL WITH GFR, CBC with Differential/Platelet,  Magnesium  Arterial thrombosis (HCC) - Plan: COMPLETE METABOLIC PANEL WITH GFR, CBC with Differential/Platelet, Magnesium, PT with INR/Fingerstick, Sedimentation rate, C-reactive protein  Thrombocytopenia (HCC) - Plan: COMPLETE METABOLIC PANEL WITH GFR, CBC with Differential/Platelet, Magnesium  Recheck a CMP along with a magnesium level.  If magnesium is low,  I will start the patient on magnesium oxide 400 mg a day.  Continue K. Dur 40 mEq a day along with calcium carbonate 500 mg p.o. 3 times daily.  INR is supratherapeutic.  Decrease Coumadin dose to 10 mg on Mondays and Fridays and 12.5 mg on all other days and recheck INR in 4 weeks.  Repeat a CBC today.  Check sed rate and CRP as requested by her rheumatologist.  If hemoglobin and potassium continue to drop we will need to notify her oncologist as they may need to alter her chemotherapy regimen.

## 2020-08-24 ENCOUNTER — Other Ambulatory Visit: Payer: Self-pay | Admitting: *Deleted

## 2020-08-24 LAB — SEDIMENTATION RATE: Sed Rate: 58 mm/h — ABNORMAL HIGH (ref 0–30)

## 2020-08-24 LAB — CBC WITH DIFFERENTIAL/PLATELET
Absolute Monocytes: 534 cells/uL (ref 200–950)
Basophils Absolute: 9 cells/uL (ref 0–200)
Basophils Relative: 0.2 %
Eosinophils Absolute: 9 cells/uL — ABNORMAL LOW (ref 15–500)
Eosinophils Relative: 0.2 %
HCT: 27.8 % — ABNORMAL LOW (ref 35.0–45.0)
Hemoglobin: 9.4 g/dL — ABNORMAL LOW (ref 11.7–15.5)
Lymphs Abs: 1417 cells/uL (ref 850–3900)
MCH: 33.3 pg — ABNORMAL HIGH (ref 27.0–33.0)
MCHC: 33.8 g/dL (ref 32.0–36.0)
MCV: 98.6 fL (ref 80.0–100.0)
MPV: 10.1 fL (ref 7.5–12.5)
Monocytes Relative: 11.6 %
Neutro Abs: 2631 cells/uL (ref 1500–7800)
Neutrophils Relative %: 57.2 %
Platelets: 136 10*3/uL — ABNORMAL LOW (ref 140–400)
RBC: 2.82 10*6/uL — ABNORMAL LOW (ref 3.80–5.10)
RDW: 19 % — ABNORMAL HIGH (ref 11.0–15.0)
Total Lymphocyte: 30.8 %
WBC: 4.6 10*3/uL (ref 3.8–10.8)

## 2020-08-24 LAB — MAGNESIUM: Magnesium: 1.3 mg/dL — ABNORMAL LOW (ref 1.5–2.5)

## 2020-08-24 LAB — COMPLETE METABOLIC PANEL WITH GFR
AG Ratio: 2.1 (calc) (ref 1.0–2.5)
ALT: 13 U/L (ref 6–29)
AST: 15 U/L (ref 10–35)
Albumin: 4 g/dL (ref 3.6–5.1)
Alkaline phosphatase (APISO): 74 U/L (ref 37–153)
BUN: 11 mg/dL (ref 7–25)
CO2: 27 mmol/L (ref 20–32)
Calcium: 8.6 mg/dL (ref 8.6–10.4)
Chloride: 105 mmol/L (ref 98–110)
Creat: 0.68 mg/dL (ref 0.50–0.99)
GFR, Est African American: 109 mL/min/{1.73_m2} (ref >=60)
GFR, Est Non African American: 94 mL/min/{1.73_m2} (ref >=60)
Globulin: 1.9 g/dL (calc) (ref 1.9–3.7)
Glucose, Bld: 104 mg/dL — ABNORMAL HIGH (ref 65–99)
Potassium: 3.5 mmol/L (ref 3.5–5.3)
Sodium: 142 mmol/L (ref 135–146)
Total Bilirubin: 0.3 mg/dL (ref 0.2–1.2)
Total Protein: 5.9 g/dL — ABNORMAL LOW (ref 6.1–8.1)

## 2020-08-24 LAB — C-REACTIVE PROTEIN: CRP: 5.7 mg/L (ref <8.0)

## 2020-08-24 MED ORDER — GABAPENTIN 300 MG PO CAPS
300.0000 mg | ORAL_CAPSULE | Freq: Two times a day (BID) | ORAL | 3 refills | Status: DC
Start: 2020-08-24 — End: 2021-09-02

## 2020-08-24 MED ORDER — WARFARIN SODIUM 5 MG PO TABS: ORAL_TABLET | ORAL | 2 refills | Status: DC

## 2020-08-24 MED ORDER — LOSARTAN POTASSIUM 50 MG PO TABS: 1.0000 | ORAL_TABLET | Freq: Every day | ORAL | 0 refills | Status: DC

## 2020-08-24 MED ORDER — ATORVASTATIN CALCIUM 40 MG PO TABS: 2.0000 | ORAL_TABLET | Freq: Every day | ORAL | 3 refills | Status: DC

## 2020-08-24 MED ORDER — LEVOTHYROXINE SODIUM 175 MCG PO TABS: ORAL_TABLET | ORAL | 0 refills | Status: DC

## 2020-08-24 MED ORDER — POTASSIUM CHLORIDE CRYS ER 20 MEQ PO TBCR: 40.0000 meq | EXTENDED_RELEASE_TABLET | Freq: Every day | ORAL | 3 refills | Status: DC

## 2020-08-27 ENCOUNTER — Other Ambulatory Visit: Payer: Self-pay

## 2020-08-27 DIAGNOSIS — R0989 Other specified symptoms and signs involving the circulatory and respiratory systems: Secondary | ICD-10-CM

## 2020-08-28 ENCOUNTER — Encounter: Payer: Self-pay | Admitting: Family Medicine

## 2020-09-07 ENCOUNTER — Ambulatory Visit
Admission: RE | Admit: 2020-09-07 | Discharge: 2020-09-07 | Disposition: A | Discharge status: Home / Self Care | Payer: Managed Care, Other (non HMO) | Source: Ambulatory Visit | Attending: Vascular Surgery | Admitting: Vascular Surgery

## 2020-09-07 DIAGNOSIS — R0989 Other specified symptoms and signs involving the circulatory and respiratory systems: Secondary | ICD-10-CM

## 2020-09-07 MED ORDER — IOPAMIDOL (ISOVUE-370) INJECTION 76%
100.0000 mL | Freq: Once | INTRAVENOUS | Status: AC | PRN
  Administered 2020-09-07: 100 mL via INTRAVENOUS

## 2020-09-09 NOTE — Progress Notes (Signed)
[facility] [place]  ASSESSMENT / PLAN: 60 y.o. female with history of femoropopliteal thrombectomy, infrarenal aortic stenting for aortic thrombus and popliteal occlusion in April 2020.  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  She has a pulse deficit in her right foot which was not present in postoperative evaluation at [facility]. CTA reviewed. She has disadvantaged right lower extremity infrageniculate flow (peroneal only runoff to the foot), but this appears to be providing adequate flow to heal (ABI / Toe Pressure normal). No options for augmenting flow to the foot. Continue best medical therapy for peripheral arterial disease (coumadin, Statin). Given her history of arterial thrombosis, lifetime anticoagulation with warfarin is recommended. Follow up with PA in 1 year with repeat ABI for surveillance.  CHIEF COMPLAINT: right foot pain  HISTORY OF PRESENT ILLNESS: Donna [name] is a 60 y.o. female to clinic for evaluation of a right foot pulse deficit identified by Dr. Meridee Score.  She has a complex past medical history.  In 2016 she was diagnosis with ovarian cancer.  She is undergone surgery and chemotherapy for this and is doing well overall.  She was diagnosed with peripheral neuropathy before her cancer diagnosis.  She also suffers from rheumatoid arthritis.  In April 2020 she presented to her oncologist office and reported right foot pain.  No pulse was identified in the right foot.  She was referred to the ER and underwent an urgent thromboembolectomy and aortic stent by Dr. Lovenia Shuck at [facility].  She had a good result and restoration of a palpable pulse in her foot.  She was seen in close interval follow-up and discharged from the vascular practice in late 2020.  In the last several months she has noted worsening pain in her right foot.  She has undergone radiologic evaluation which is  revealed avascular necrosis of the right foot.  She saw Dr. Meridee Score who noted no pulse in her right foot, and recommended she be seen by our group.  The patient reports no symptoms of intermittent claudication.  She does not have ischemic rest pain.  She has no ulceration about her foot.  Reports her activity has been limited by her pain in her right foot.  09/11/20: Returns after CTA. She reports no change in her symptoms. CTA shows peroneal only runoff to the foot, but brisk filling of the pedal vessels.   Past Medical History:  Diagnosis Date  . Anxiety   . Arterial thrombosis (HCC)    right sfa, popliteal artery.  Require thromectomy at [facility] and lifelong anticoagulation.  . Cancer ([place])    ovarian, recurrent on chemo (carboplatin, doxorubicin) at [facility]  . Colon polyp   . Depression   . Full dentures   . GERD (gastroesophageal reflux disease)   . HLD (hyperlipidemia) 12/03/2012  . Hypertension   . Hypothyroidism   . IBS (irritable bowel syndrome)   . Neck mass   . Neuropathy    feet  . Rheumatoid arthritis ([place])    at [facility]  . Schatzki's ring   . Wears glasses     Past Surgical History:  Procedure Laterality Date  . ABDOMINAL HYSTERECTOMY    . ABLATION    . COLONOSCOPY    . CYST REMOVAL NECK  05/13/12  . EAR CYST EXCISION  05/13/2012   Procedure: CYST REMOVAL;  Surgeon: Ralene Ok, MD;  Location: [place];  Service: General;  Laterality: Left;  excision of neck cyst  . THYROID SURGERY  10/2011 - approximate   ablation  . TONSILLECTOMY    . UPPER GI ENDOSCOPY      Family History  Problem Relation Age of Onset  . Heart attack Mother   . Hypertension Mother   . Diabetes Mother   . Heart disease Mother   . CVA Father   . Hypertension Father   . Diabetes Father   . Stroke Father   . Cancer Brother        lung cancer - stage 4  . Cancer Brother        skin   . Heart disease Maternal Grandfather   . Heart attack Sister        CABG  . Diabetes Sister   .  Stroke Sister   . Hypertension Sister   . Hyperlipidemia Sister   . Kidney disease Sister   . Stroke Sister   . Heart attack Sister     Social History   Socioeconomic History  . Marital status: Single    Spouse name: Not on file  . Number of children: Not on file  . Years of education: Not on file  . Highest education level: Not on file  Occupational History  . Not on file  Tobacco Use  . Smoking status: Former Smoker    Years: 32.00    Quit date: 06/19/2011    Years since quitting: 9.2  . Smokeless tobacco: Never Used  Vaping Use  . Vaping Use: Never used  Substance and Sexual Activity  . Alcohol use: No  . Drug use: No  . Sexual activity: Yes  Other Topics Concern  . Not on file  Social History Narrative  . Not on file   Social Determinants of Health   Financial Resource Strain: Not on file  Food Insecurity: Not on file  Transportation Needs: Not on file  Physical Activity: Not on file  Stress: Not on file  Social Connections: Not on file  Intimate Partner Violence: Not on file    Allergies  Allergen Reactions  . Paclitaxel Other (See Comments)    Flushing, Itching, Throat tightness    Current Outpatient Medications  Medication Sig Dispense Refill  . amLODipine (NORVASC) 10 MG tablet Take 1 tablet by mouth once daily 90 tablet 0  . atorvastatin (LIPITOR) 40 MG tablet Take 2 tablets (80 mg total) by mouth daily. 180 tablet 3  . calcium carbonate (OS-CAL - DOSED IN MG OF ELEMENTAL CALCIUM) 1250 (500 Ca) MG tablet Take 1 tablet (500 mg of elemental calcium total) by mouth 3 (three) times daily with meals. 90 tablet 0  . Cholecalciferol (VITAMIN D3) 50 MCG (2000 UT) capsule Take 2,000 Units by mouth daily.    Marland Kitchen dexamethasone (DECADRON) 4 MG tablet Take 4 mg by mouth See admin instructions. Takes 2 tablets 2 days after chemo possibly - pt unsure    . diphenhydrAMINE (BENADRYL) 25 MG tablet Take 25 mg by mouth at bedtime as needed. For sleep    . gabapentin  (NEURONTIN) 300 MG capsule Take 1 capsule (300 mg total) by mouth 2 (two) times daily. 108 capsule 3  . leflunomide (ARAVA) 20 MG tablet Take 20 mg by mouth daily.    Marland Kitchen levothyroxine (SYNTHROID) 175 MCG tablet TAKE 1 TABLET BY MOUTH ONCE DAILY BEFORE BREAKFAST 90 tablet 0  . losartan (COZAAR) 50 MG tablet Take 1 tablet (50 mg total) by mouth daily.  90 tablet 0  . OLANZapine (ZYPREXA) 5 MG tablet Take 1 tablet by mouth nightly for 4 nights, starting on the day of chemotherapy each cycle.    Marland Kitchen omeprazole (PRILOSEC) 40 MG capsule Take 1 capsule (40 mg total) by mouth in the morning and at bedtime. 60 capsule 3  . ondansetron (ZOFRAN) 8 MG tablet Take 8 mg by mouth daily as needed for nausea, vomiting or refractory nausea / vomiting.    . potassium chloride SA (KLOR-CON) 20 MEQ tablet Take 2 tablets (40 mEq total) by mouth daily. 90 tablet 3  . prochlorperazine (COMPAZINE) 10 MG tablet Take 1 tablet (10 mg) by mouth every six hours as needed for nausea if not controlled with other nausea medicines.    . vitamin B-12 (CYANOCOBALAMIN) 1000 MCG tablet Take 1,000 mcg by mouth daily.    Marland Kitchen warfarin (COUMADIN) 5 MG tablet TAKE 1 & 1/2 (ONE & ONE-HALF) TABLETS BY MOUTH ONCE DAILY ON TUE, THURS, SAT, & SUN. THEN TAKE 2 TABLETS BY MOUTH ONCE DAILY ON OTHER DAYS 135 tablet 2  . WARFARIN SODIUM PO Take 12.5 mg by mouth daily.     No current facility-administered medications for this visit.    REVIEW OF SYSTEMS:  [X]  denotes positive finding, [ ]  denotes negative finding Cardiac  Comments:  Chest pain or chest pressure:    Shortness of breath upon exertion:    Short of breath when lying flat:    Irregular heart rhythm:        Vascular    Pain in calf, thigh, or hip brought on by ambulation:    Pain in feet at night that wakes you up from your sleep:     Blood clot in your veins:    Leg swelling:         Pulmonary    Oxygen at home:    Productive cough:     Wheezing:         Neurologic    Sudden  weakness in arms or legs:     Sudden numbness in arms or legs:     Sudden onset of difficulty speaking or slurred speech:    Temporary loss of vision in one eye:     Problems with dizziness:         Gastrointestinal    Blood in stool:     Vomited blood:         Genitourinary    Burning when urinating:     Blood in urine:        Psychiatric    Major depression:         Hematologic    Bleeding problems:    Problems with blood clotting too easily:        Skin    Rashes or ulcers:        Constitutional    Fever or chills:      PHYSICAL EXAM  There were no vitals filed for this visit.  Constitutional: well appearing. no distress. Appears well nourished.  Neurologic: CN intact. no focal findings. no sensory loss. Psychiatric: Mood and affect symmetric and appropriate. Eyes: No icterus. No conjunctival pallor. Ears, nose, throat: mucous membranes moist. Midline trachea.  Cardiac: regular rate and rhythm.  Respiratory: unlabored. Abdominal: soft, non-tender, non-distended.  Peripheral vascular:  2+ L femoral pulse  1+ R femoral pulse  2+ L DP / PT  Absent R DP / PT Extremity: No edema. No cyanosis. No pallor.  Skin: No gangrene. No  ulceration.  Lymphatic: No Stemmer's sign. No palpable lymphadenopathy.  PERTINENT LABORATORY AND RADIOLOGIC DATA  Most recent CBC CBC Latest Ref Rng & Units 08/23/2020 08/17/2020 08/16/2020  WBC 3.8 - 10.8 Thousand/uL 4.6 3.2(L) 3.1(L)  Hemoglobin 11.7 - 15.5 g/dL 9.4(L) 8.8(L) 8.5(L)  Hematocrit 35.0 - 45.0 % 27.8(L) 27.5(L) 25.9(L)  Platelets 140 - 400 Thousand/uL 136(L) 86(L) 82(L)     Most recent CMP CMP Latest Ref Rng & Units 08/23/2020 08/17/2020 08/17/2020  Glucose 65 - 99 mg/dL 104(H) 92 -  BUN 7 - 25 mg/dL 11 6(L) -  Creatinine 0.50 - 0.99 mg/dL 0.68 0.58 -  Sodium 135 - 146 mmol/L 142 140 -  Potassium 3.5 - 5.3 mmol/L 3.5 3.8 -  Chloride 98 - 110 mmol/L 105 111 -  CO2 20 - 32 mmol/L 27 21(L) -  Calcium 8.6 - 10.4 mg/dL 8.6 8.1(L)  7.9(L)  Total Protein 6.1 - 8.1 g/dL 5.9(L) - -  Total Bilirubin 0.2 - 1.2 mg/dL 0.3 - -  Alkaline Phos 38 - 126 U/L - - -  AST 10 - 35 U/L 15 - -  ALT 6 - 29 U/L 13 - -    Renal function CrCl cannot be calculated (Unknown ideal weight.).  Hgb A1c MFr Bld (% of total Hgb)  Date Value  08/14/2020 5.2    Ldl Cholesterol, Calc  Date Value Ref Range Status  06/18/2011 128 (H) 0 - 100 mg/dL Final   LDL Cholesterol (Calc)  Date Value Ref Range Status  09/02/2019 108 (H) mg/dL (calc) Final    Comment:    Reference range: <100 . Desirable range <100 mg/dL for primary prevention;   <70 mg/dL for patients with CHD or diabetic patients  with > or = 2 CHD risk factors. Marland Kitchen LDL-C is now calculated using the Martin-Hopkins  calculation, which is a validated novel method providing  better accuracy than the Friedewald equation in the  estimation of LDL-C.  Cresenciano Genre et al. Annamaria Helling. [id number]): [id number]  (http://education.[employer].com/faq/FAQ164)    Direct LDL  Date Value Ref Range Status  06/30/2016 113 <130 mg/dL Final    Comment:      Desirable range <100 mg/dL for patients with CHD or diabetes and <70 mg/dL for diabetic patients with known heart disease.        Vascular Imaging:   CTA = peroneal only runoff to the R foot; no large vessel explanation for avascular necrosis of right foot.  Yevonne Aline. Stanford Breed, MD [facility] Phone Number: [phone] 09/09/2020 9:34 AM

## 2020-09-11 ENCOUNTER — Other Ambulatory Visit: Payer: Self-pay

## 2020-09-11 ENCOUNTER — Ambulatory Visit: Payer: Managed Care, Other (non HMO) | Admitting: Vascular Surgery

## 2020-09-11 ENCOUNTER — Encounter: Payer: Self-pay | Admitting: Vascular Surgery

## 2020-09-11 VITALS — BP 117/73 | HR 70 | Temp 97.5°F | Resp 20 | Ht 67.0 in | Wt 240.0 lb

## 2020-09-11 DIAGNOSIS — M8700 Idiopathic aseptic necrosis of unspecified bone: Secondary | ICD-10-CM | POA: Diagnosis not present

## 2020-09-11 DIAGNOSIS — Z9889 Other specified postprocedural states: Secondary | ICD-10-CM | POA: Diagnosis not present

## 2020-09-24 ENCOUNTER — Other Ambulatory Visit: Payer: Self-pay

## 2020-09-24 ENCOUNTER — Ambulatory Visit (INDEPENDENT_AMBULATORY_CARE_PROVIDER_SITE_OTHER): Payer: Managed Care, Other (non HMO) | Admitting: Family Medicine

## 2020-09-24 DIAGNOSIS — E876 Hypokalemia: Secondary | ICD-10-CM | POA: Diagnosis not present

## 2020-09-24 DIAGNOSIS — I749 Embolism and thrombosis of unspecified artery: Secondary | ICD-10-CM | POA: Diagnosis not present

## 2020-09-24 LAB — PT WITH INR/FINGERSTICK
INR, fingerstick: 4.3 ratio — ABNORMAL HIGH
PT, fingerstick: 51.4 s — ABNORMAL HIGH (ref 10.5–13.1)

## 2020-09-24 MED ORDER — TRAMADOL HCL 50 MG PO TABS: 50.0000 mg | ORAL_TABLET | Freq: Three times a day (TID) | ORAL | 0 refills | Status: AC | PRN

## 2020-09-24 NOTE — Progress Notes (Signed)
Subjective:    Patient ID: Donna [name], female    DOB: 10-15-1960, 60 y.o.   MRN: [id number]  Patient is a very pleasant 60 year old Caucasian female here today to recheck her INR.  She is currently taking 12.5 mg of Coumadin 5 days a week and 10 mg of Coumadin 2 days a week.  Her INR today is supratherapeutic at 4.3.  She does report increased bruising and also mention a nosebleed this week.  Nothing else is changed regarding her chronic medication.  At her last visit, her magnesium was found to be extremely low and contributing factor to her persistent hypokalemia.  She is due to recheck her magnesium and potassium.  She is taking daily magnesium and potassium.  She is also requesting something for pain.  She is dealing with severe pain in her hip particular her right hip.  She states she is already taking 3000 mg of Tylenol on a daily basis.  Due to Coumadin she cannot take NSAIDs.  She does not want to become addicted however she would like to have a pain medication that she could use infrequently on severe days.  She has an appointment to see an orthopedist however due to her surgery risk, I do not think she is a surgical candidate.  She would also be at increased risk for intra-articular cortisone injections due to the Coumadin. Past Medical History:  Diagnosis Date  . Anxiety   . Arterial thrombosis (HCC)    right sfa, popliteal artery.  Require thromectomy at [facility] and lifelong anticoagulation.  . Cancer ([place])    ovarian, recurrent on chemo (carboplatin, doxorubicin) at [facility]  . Colon polyp   . Depression   . Full dentures   . GERD (gastroesophageal reflux disease)   . HLD (hyperlipidemia) 12/03/2012  . Hypertension   . Hypothyroidism   . IBS (irritable bowel syndrome)   . Neck mass   . Neuropathy    feet  . Rheumatoid arthritis ([place])    at [facility]  . Schatzki's ring   . Wears glasses    Past Surgical History:  Procedure Laterality Date  . ABDOMINAL HYSTERECTOMY    . ABLATION     . COLONOSCOPY    . CYST REMOVAL NECK  05/13/12  . EAR CYST EXCISION  05/13/2012   Procedure: CYST REMOVAL;  Surgeon: Ralene Ok, MD;  Location: [place];  Service: General;  Laterality: Left;  excision of neck cyst  . THYROID SURGERY  10/2011 - approximate   ablation  . TONSILLECTOMY    . UPPER GI ENDOSCOPY     Current Outpatient Medications on File Prior to Visit  Medication Sig Dispense Refill  . amLODipine (NORVASC) 10 MG tablet Take 1 tablet by mouth once daily 90 tablet 0  . atorvastatin (LIPITOR) 40 MG tablet Take 2 tablets (80 mg total) by mouth daily. 180 tablet 3  . Cholecalciferol (VITAMIN D3) 50 MCG (2000 UT) capsule Take 2,000 Units by mouth daily.    . diphenhydrAMINE (BENADRYL) 25 MG tablet Take 25 mg by mouth at bedtime as needed. For sleep    . gabapentin (NEURONTIN) 300 MG capsule Take 1 capsule (300 mg total) by mouth 2 (two) times daily. 108 capsule 3  . leflunomide (ARAVA) 20 MG tablet Take 20 mg by mouth daily.    Marland Kitchen levothyroxine (SYNTHROID) 175 MCG tablet TAKE 1 TABLET BY MOUTH ONCE DAILY BEFORE BREAKFAST 90 tablet 0  . losartan (COZAAR) 50 MG tablet Take 1 tablet (50 mg  total) by mouth daily. 90 tablet 0  . omeprazole (PRILOSEC) 40 MG capsule Take 1 capsule (40 mg total) by mouth in the morning and at bedtime. 60 capsule 3  . ondansetron (ZOFRAN) 8 MG tablet Take 8 mg by mouth daily as needed for nausea, vomiting or refractory nausea / vomiting.    . potassium chloride SA (KLOR-CON) 20 MEQ tablet Take 2 tablets (40 mEq total) by mouth daily. 90 tablet 3  . vitamin B-12 (CYANOCOBALAMIN) 1000 MCG tablet Take 1,000 mcg by mouth daily.    . WARFARIN SODIUM PO Take 12.5 mg by mouth daily.    . calcium carbonate (OS-CAL - DOSED IN MG OF ELEMENTAL CALCIUM) 1250 (500 Ca) MG tablet Take 1 tablet (500 mg of elemental calcium total) by mouth 3 (three) times daily with meals. 90 tablet 0   No current facility-administered medications on file prior to visit.   Allergies   Allergen Reactions  . Paclitaxel Other (See Comments)    Flushing, Itching, Throat tightness   Social History   Socioeconomic History  . Marital status: Single    Spouse name: Not on file  . Number of children: Not on file  . Years of education: Not on file  . Highest education level: Not on file  Occupational History  . Not on file  Tobacco Use  . Smoking status: Former Smoker    Years: 32.00    Quit date: 06/19/2011    Years since quitting: 9.2  . Smokeless tobacco: Never Used  Vaping Use  . Vaping Use: Never used  Substance and Sexual Activity  . Alcohol use: No  . Drug use: No  . Sexual activity: Yes  Other Topics Concern  . Not on file  Social History Narrative  . Not on file   Social Determinants of Health   Financial Resource Strain: Not on file  Food Insecurity: Not on file  Transportation Needs: Not on file  Physical Activity: Not on file  Stress: Not on file  Social Connections: Not on file  Intimate Partner Violence: Not on file      Review of Systems  All other systems reviewed and are negative.      Objective:   Physical Exam Vitals reviewed.  Constitutional:      Appearance: She is normal weight.  Cardiovascular:     Rate and Rhythm: Normal rate and regular rhythm.     Heart sounds: Normal heart sounds.  Pulmonary:     Effort: Pulmonary effort is normal.     Breath sounds: Normal breath sounds.  Musculoskeletal:     Right lower leg: No edema.     Left lower leg: No edema.  Neurological:     Mental Status: She is alert.           Assessment & Plan:  Hypomagnesemia - Plan: Magnesium, BASIC METABOLIC PANEL WITH GFR  Hypokalemia - Plan: Magnesium, BASIC METABOLIC PANEL WITH GFR, PT with INR/Fingerstick  Arterial thrombosis (HCC) - Plan: PT with INR/Fingerstick  Check a BMP and a magnesium level to monitor her hypokalemia and hypomagnesemia.  Decrease Coumadin to 10 mg a day however I specifically asked her to hold Coumadin for  the next 48 hours to allow her INR to titrate down.  Recheck INR in 2 to 3 weeks on 10 mg a day.  Also gave her tramadol 50 mg tablets.  She can take 1 to 2 tablets every 8 hours as needed for severe pain.  We can titrate  her tramadol as necessary to achieve pain control.

## 2020-09-25 LAB — BASIC METABOLIC PANEL WITH GFR
BUN: 9 mg/dL (ref 7–25)
CO2: 27 mmol/L (ref 20–32)
Calcium: 8.9 mg/dL (ref 8.6–10.4)
Chloride: 105 mmol/L (ref 98–110)
Creat: 0.59 mg/dL (ref 0.50–0.99)
GFR, Est African American: 115 mL/min/{1.73_m2} (ref >=60)
GFR, Est Non African American: 99 mL/min/{1.73_m2} (ref >=60)
Glucose, Bld: 118 mg/dL — ABNORMAL HIGH (ref 65–99)
Potassium: 3.7 mmol/L (ref 3.5–5.3)
Sodium: 142 mmol/L (ref 135–146)

## 2020-09-25 LAB — MAGNESIUM: Magnesium: 1.4 mg/dL — ABNORMAL LOW (ref 1.5–2.5)

## 2020-09-26 ENCOUNTER — Ambulatory Visit: Payer: Managed Care, Other (non HMO) | Admitting: Podiatry

## 2020-09-27 ENCOUNTER — Ambulatory Visit (INDEPENDENT_AMBULATORY_CARE_PROVIDER_SITE_OTHER): Payer: Managed Care, Other (non HMO) | Admitting: Orthopedic Surgery

## 2020-09-27 ENCOUNTER — Encounter: Payer: Self-pay | Admitting: Orthopedic Surgery

## 2020-09-27 ENCOUNTER — Ambulatory Visit (INDEPENDENT_AMBULATORY_CARE_PROVIDER_SITE_OTHER): Payer: Managed Care, Other (non HMO)

## 2020-09-27 ENCOUNTER — Ambulatory Visit: Payer: Self-pay

## 2020-09-27 DIAGNOSIS — M87051 Idiopathic aseptic necrosis of right femur: Secondary | ICD-10-CM

## 2020-09-27 DIAGNOSIS — M79671 Pain in right foot: Secondary | ICD-10-CM | POA: Diagnosis not present

## 2020-09-27 DIAGNOSIS — M25551 Pain in right hip: Secondary | ICD-10-CM

## 2020-09-27 NOTE — Progress Notes (Signed)
Office Visit Note   Patient: Donna [name]           Date of Birth: [birth year]           MRN: [id number] Visit Date: 09/27/2020              Requested by: Susy Frizzle, MD [address] [place],  [place] [address] PCP: Susy Frizzle, MD  Chief Complaint  Patient presents with  . Right Foot - Pain  . Right Leg - Pain      HPI: Patient is a 60 year old woman who is seen in follow-up for right hip pain and right foot pain.  Patient has avascular necrosis as well as being currently treated with chemotherapy for cancer.  Patient is recently seen vascular vein surgery and her arterial studies showed normal circulation.  Assessment & Plan: Visit Diagnoses:  1. Pain in right hip   2. Pain in right foot   3. Avascular necrosis of bone of hip, right (HCC)     Plan: Patient's most significant symptoms involve the right hip.  She will schedule follow-up appointment with Dr. Ninfa Linden and also has a repeat evaluation for possibly restarting chemotherapy.  Discussed that if she does start chemotherapy then an intra-articular injection for the right hip would be the next best option if she is off chemotherapy then evaluation for total hip arthroplasty on the right would be beneficial.  Once her hip symptoms are resolved we can evaluate for treatment for the global right foot pain.  Follow-Up Instructions: Return if symptoms worsen or fail to improve, for Follow-up with Dr. Ninfa Linden for her AVN right hip.   Ortho Exam  Patient is alert, oriented, no adenopathy, well-dressed, normal affect, normal respiratory effort. Examination patient has a significant abductor lurch on the right with swinging her body weight over the right hip.  She does have symmetric range of motion of both hips with about 70 degrees of external rotation bilaterally and 20 degrees of internal rotation bilaterally.  She has a negative straight leg bytes laterally.  She is currently using Tylenol 3000 mg a day  for her symptoms.  The right foot is globally tender to palpation around the foot and ankle with no focal areas of pain.  Patient states she does have a prescription for tramadol to see if this could help with her symptoms.  Imaging: No results found. No images are attached to the encounter.  Labs: Lab Results  Component Value Date   HGBA1C 5.2 08/14/2020   HGBA1C 5.3 11/04/2017   ESRSEDRATE 58 (H) 08/23/2020   ESRSEDRATE 45 (H) 05/01/2020   ESRSEDRATE 25 07/27/2018   CRP 5.7 08/23/2020   CRP 9.4 (H) 05/01/2020   LABURIC 7.6 (H) 06/10/2018     Lab Results  Component Value Date   ALBUMIN 3.6 08/17/2020   ALBUMIN 3.5 08/16/2020   ALBUMIN 3.9 08/15/2020    Lab Results  Component Value Date   MG 1.4 (L) 09/24/2020   MG 1.3 (L) 08/23/2020   MG 1.9 08/17/2020   Lab Results  Component Value Date   VD25OH 57.25 08/17/2020    No results found for: PREALBUMIN CBC EXTENDED Latest Ref Rng & Units 08/23/2020 08/17/2020 08/16/2020  WBC 3.8 - 10.8 Thousand/uL 4.6 3.2(L) 3.1(L)  RBC 3.80 - 5.10 Million/uL 2.82(L) 2.62(L) 2.56(L)  HGB 11.7 - 15.5 g/dL 9.4(L) 8.8(L) 8.5(L)  HCT 35.0 - 45.0 % 27.8(L) 27.5(L) 25.9(L)  PLT 140 - 400 Thousand/uL 136(L) 86(L)  82(L)  NEUTROABS 1,500 - 7,800 cells/uL 2,631 - -  LYMPHSABS 850 - 3,900 cells/uL 1,417 - -     There is no height or weight on file to calculate BMI.  Orders:  Orders Placed This Encounter  Procedures  . XR HIP UNILAT W OR W/O PELVIS 2-3 VIEWS RIGHT  . XR Foot 2 Views Right   No orders of the defined types were placed in this encounter.    Procedures: No procedures performed  Clinical Data: No additional findings.  ROS:  All other systems negative, except as noted in the HPI. Review of Systems  Objective: Vital Signs: There were no vitals taken for this visit.  Specialty Comments:  No specialty comments available.  PMFS History: Patient Active Problem List   Diagnosis Date Noted  . Hypokalemia   .  Hypomagnesemia   . Hypocalcemia   . Thrombocytopenia ([place])   . Hypertension   . Malignant neoplasm of ovary ([place])   . Symptomatic anemia 08/15/2020  . Rheumatoid arthritis ([place])   . Arterial thrombosis ([place])   . Nodule of vagina 02/03/2017  . Pulmonary nodule 02/03/2017  . Primary malignant neoplasm of ovary ([place]) 12/07/2014  . Hypothyroidism 12/03/2012  . HLD (hyperlipidemia) 12/03/2012  . OVERWEIGHT 06/22/2007  . NICOTINE ADDICTION 06/22/2007  . DEPRESSION 06/22/2007  . Essential hypertension 06/22/2007  . GERD 06/22/2007  . OSTEOARTHRITIS 06/22/2007   Past Medical History:  Diagnosis Date  . Anxiety   . Arterial thrombosis (HCC)    right sfa, popliteal artery.  Require thromectomy at [facility] and lifelong anticoagulation.  . Cancer ([place])    ovarian, recurrent on chemo (carboplatin, doxorubicin) at [facility]  . Colon polyp   . Depression   . Full dentures   . GERD (gastroesophageal reflux disease)   . HLD (hyperlipidemia) 12/03/2012  . Hypertension   . Hypothyroidism   . IBS (irritable bowel syndrome)   . Neck mass   . Neuropathy    feet  . Rheumatoid arthritis ([place])    at [facility]  . Schatzki's ring   . Wears glasses     Family History  Problem Relation Age of Onset  . Heart attack Mother   . Hypertension Mother   . Diabetes Mother   . Heart disease Mother   . CVA Father   . Hypertension Father   . Diabetes Father   . Stroke Father   . Cancer Brother        lung cancer - stage 4  . Cancer Brother        skin   . Heart disease Maternal Grandfather   . Heart attack Sister        CABG  . Diabetes Sister   . Stroke Sister   . Hypertension Sister   . Hyperlipidemia Sister   . Kidney disease Sister   . Stroke Sister   . Heart attack Sister     Past Surgical History:  Procedure Laterality Date  . ABDOMINAL HYSTERECTOMY    . ABLATION    . COLONOSCOPY    . CYST REMOVAL NECK  05/13/12  . EAR CYST EXCISION  05/13/2012   Procedure: CYST REMOVAL;  Surgeon: Ralene Ok, MD;  Location: [place];  Service: General;  Laterality: Left;  excision of neck cyst  . THYROID SURGERY  10/2011 - approximate   ablation  . TONSILLECTOMY    . UPPER GI ENDOSCOPY     Social History   Occupational History  . Not on file  Tobacco Use  . Smoking status: Former Smoker    Years: 32.00    Quit date: 06/19/2011    Years since quitting: 9.2  . Smokeless tobacco: Never Used  Vaping Use  . Vaping Use: Never used  Substance and Sexual Activity  . Alcohol use: No  . Drug use: No  . Sexual activity: Yes

## 2020-10-09 ENCOUNTER — Ambulatory Visit: Payer: Managed Care, Other (non HMO) | Admitting: Orthopedic Surgery

## 2020-10-11 ENCOUNTER — Ambulatory Visit (INDEPENDENT_AMBULATORY_CARE_PROVIDER_SITE_OTHER): Payer: Managed Care, Other (non HMO) | Admitting: Orthopaedic Surgery

## 2020-10-11 ENCOUNTER — Encounter: Payer: Self-pay | Admitting: Orthopaedic Surgery

## 2020-10-11 ENCOUNTER — Other Ambulatory Visit: Payer: Self-pay

## 2020-10-11 VITALS — Ht 67.0 in | Wt 240.0 lb

## 2020-10-11 DIAGNOSIS — M5441 Lumbago with sciatica, right side: Secondary | ICD-10-CM

## 2020-10-11 DIAGNOSIS — G8929 Other chronic pain: Secondary | ICD-10-CM | POA: Diagnosis not present

## 2020-10-11 DIAGNOSIS — M25551 Pain in right hip: Secondary | ICD-10-CM

## 2020-10-11 MED ORDER — METHOCARBAMOL 500 MG PO TABS: 500.0000 mg | ORAL_TABLET | Freq: Four times a day (QID) | ORAL | 1 refills | Status: DC | PRN

## 2020-10-11 NOTE — Progress Notes (Signed)
Office Visit Note   Patient: Donna [name]           Date of Birth: [birth year]           MRN: [id number] Visit Date: 10/11/2020              Requested by: Susy Frizzle, MD [address] [place],  [place] [address] PCP: Susy Frizzle, MD   Assessment & Plan: Visit Diagnoses:  1. Pain in right hip   2. Chronic right-sided low back pain with right-sided sciatica     Plan: Based on the combination of her exam and clinical findings as well as radiographic findings, she does have mild to moderate arthritis of her right hip but her symptoms are minimal from that area.  I think this is more of an issue with sciatica and how her gait is been off from dealing with the calcaneus AVN on the right side.  I feel that going to physical therapy as an outpatient at [place] with the [facility] there would help with the pain that she is having with any modalities per the therapist discretion.  They can also evaluate her gait.  She agrees with this treatment plan.  I will least try a little bit of Robaxin for her and would like to see her back in 6 weeks.  All questions and concerns were answered and addressed.  Follow-Up Instructions: Return in about 6 weeks (around 11/22/2020).   Orders:  No orders of the defined types were placed in this encounter.  Meds ordered this encounter  Medications  . methocarbamol (ROBAXIN) 500 MG tablet    Sig: Take 1 tablet (500 mg total) by mouth every 6 (six) hours as needed.    Dispense:  40 tablet    Refill:  1      Procedures: No procedures performed   Clinical Data: No additional findings.   Subjective: Chief Complaint  Patient presents with  . Right Hip - Follow-up  Patient is a very pleasant 60 year old female sent from Dr. Sharol Given to see me for her right hip.  She has a history of AVN of her right calcaneus.  There was thought that she may have this with her hip.  There is actually plain films in our system and a CTA  that involves the abdomen and pelvis where I can see her hips.  She denies any groin pain on the right side.  She points to the sciatic region in the hamstring region as a source of her pain and some over the trochanteric region of her right hip.  She is on Coumadin so she cannot take anti-inflammatories.  A steroid taper did not help much.  This is not debilitating for her.  And again, she denies any groin pain at all on the right or left sides.  HPI  Review of Systems She currently denies any headache, chest pain, shortness of breath, fever, chills, nausea, vomiting  Objective: Vital Signs: Ht 5\' 7"  (1.702 m)   Wt 240 lb (108.9 kg)   BMI 37.59 kg/m   Physical Exam She is alert and orient x3 and in no acute distress Ortho Exam Examination of both hips show the smooth smoothly and fluidly and have full internal and external rotation with no pain in the groin at all.  The right side has some mild pain to palpation of the trochanteric area but more so over the ischium and sciatic region. Specialty Comments:  No specialty comments available.  Imaging: No results found. I was able to independently review the plain films of her pelvis and hips including the CTA that shows pelvis and hips.  There is no evidence of avascular necrosis.  There is mild to moderate arthritic changes with some cystic changes in the acetabulum but no deformity the femoral head and I do not see evidence of varus necrosis.  I recall this more of mild to moderate arthritis with the hip.  PMFS History: Patient Active Problem List   Diagnosis Date Noted  . Hypokalemia   . Hypomagnesemia   . Hypocalcemia   . Thrombocytopenia ([place])   . Hypertension   . Malignant neoplasm of ovary ([place])   . Symptomatic anemia 08/15/2020  . Rheumatoid arthritis ([place])   . Arterial thrombosis ([place])   . Nodule of vagina 02/03/2017  . Pulmonary nodule 02/03/2017  . Primary malignant neoplasm of ovary ([place]) 12/07/2014  . Hypothyroidism  12/03/2012  . HLD (hyperlipidemia) 12/03/2012  . OVERWEIGHT 06/22/2007  . NICOTINE ADDICTION 06/22/2007  . DEPRESSION 06/22/2007  . Essential hypertension 06/22/2007  . GERD 06/22/2007  . OSTEOARTHRITIS 06/22/2007   Past Medical History:  Diagnosis Date  . Anxiety   . Arterial thrombosis (HCC)    right sfa, popliteal artery.  Require thromectomy at [facility] and lifelong anticoagulation.  . Cancer ([place])    ovarian, recurrent on chemo (carboplatin, doxorubicin) at [facility]  . Colon polyp   . Depression   . Full dentures   . GERD (gastroesophageal reflux disease)   . HLD (hyperlipidemia) 12/03/2012  . Hypertension   . Hypothyroidism   . IBS (irritable bowel syndrome)   . Neck mass   . Neuropathy    feet  . Rheumatoid arthritis ([place])    at [facility]  . Schatzki's ring   . Wears glasses     Family History  Problem Relation Age of Onset  . Heart attack Mother   . Hypertension Mother   . Diabetes Mother   . Heart disease Mother   . CVA Father   . Hypertension Father   . Diabetes Father   . Stroke Father   . Cancer Brother        lung cancer - stage 4  . Cancer Brother        skin   . Heart disease Maternal Grandfather   . Heart attack Sister        CABG  . Diabetes Sister   . Stroke Sister   . Hypertension Sister   . Hyperlipidemia Sister   . Kidney disease Sister   . Stroke Sister   . Heart attack Sister     Past Surgical History:  Procedure Laterality Date  . ABDOMINAL HYSTERECTOMY    . ABLATION    . COLONOSCOPY    . CYST REMOVAL NECK  05/13/12  . EAR CYST EXCISION  05/13/2012   Procedure: CYST REMOVAL;  Surgeon: Ralene Ok, MD;  Location: [place];  Service: General;  Laterality: Left;  excision of neck cyst  . THYROID SURGERY  10/2011 - approximate   ablation  . TONSILLECTOMY    . UPPER GI ENDOSCOPY     Social History   Occupational History  . Not on file  Tobacco Use  . Smoking status: Former Smoker    Years: 32.00    Quit date: 06/19/2011    Years since  quitting: 9.3  . Smokeless tobacco: Never Used  Vaping Use  . Vaping Use: Never used  Substance and Sexual Activity  . Alcohol use: No  . Drug use: No  . Sexual activity: Yes

## 2020-10-17 ENCOUNTER — Other Ambulatory Visit: Payer: Self-pay | Admitting: Family Medicine

## 2020-10-24 ENCOUNTER — Ambulatory Visit: Payer: Medicare (Managed Care) | Attending: Orthopaedic Surgery

## 2020-10-24 ENCOUNTER — Other Ambulatory Visit: Payer: Self-pay

## 2020-10-24 DIAGNOSIS — M25571 Pain in right ankle and joints of right foot: Secondary | ICD-10-CM | POA: Diagnosis not present

## 2020-10-24 DIAGNOSIS — R2689 Other abnormalities of gait and mobility: Secondary | ICD-10-CM | POA: Diagnosis not present

## 2020-10-24 DIAGNOSIS — M5441 Lumbago with sciatica, right side: Secondary | ICD-10-CM | POA: Diagnosis not present

## 2020-10-24 DIAGNOSIS — M25551 Pain in right hip: Secondary | ICD-10-CM | POA: Diagnosis not present

## 2020-10-24 DIAGNOSIS — M6281 Muscle weakness (generalized): Secondary | ICD-10-CM | POA: Insufficient documentation

## 2020-10-24 NOTE — Therapy (Signed)
[place] [place], [place], [address] Phone: [phone]   Fax:  [phone]  Physical Therapy Evaluation  Patient Details  Name: Donna [name] MRN: [id number] Date of Birth: Jan 07, 1961 Referring Provider (PT): Ninfa Linden   Encounter Date: 10/24/2020   PT End of Session - 10/24/20 1432    Visit Number 1    Date for PT Re-Evaluation 12/19/20    Authorization Type [employer]    PT Start Time 1345    PT Stop Time 1430    PT Time Calculation (min) 45 min    Activity Tolerance Patient tolerated treatment well;Patient limited by pain    Behavior During Therapy [facility] for tasks assessed/performed           Past Medical History:  Diagnosis Date  . Anxiety   . Arterial thrombosis (HCC)    right sfa, popliteal artery.  Require thromectomy at [facility] and lifelong anticoagulation.  . Cancer ([place])    ovarian, recurrent on chemo (carboplatin, doxorubicin) at [facility]  . Colon polyp   . Depression   . Full dentures   . GERD (gastroesophageal reflux disease)   . HLD (hyperlipidemia) 12/03/2012  . Hypertension   . Hypothyroidism   . IBS (irritable bowel syndrome)   . Neck mass   . Neuropathy    feet  . Rheumatoid arthritis ([place])    at [facility]  . Schatzki's ring   . Wears glasses     Past Surgical History:  Procedure Laterality Date  . ABDOMINAL HYSTERECTOMY    . ABLATION    . COLONOSCOPY    . CYST REMOVAL NECK  05/13/12  . EAR CYST EXCISION  05/13/2012   Procedure: CYST REMOVAL;  Surgeon: Ralene Ok, MD;  Location: [place];  Service: General;  Laterality: Left;  excision of neck cyst  . THYROID SURGERY  10/2011 - approximate   ablation  . TONSILLECTOMY    . UPPER GI ENDOSCOPY      There were no vitals filed for this visit.    Subjective Assessment - 10/24/20 1347    Subjective End of last year had xrays for right ankle/foot pain. February had MRI for this and had AVN of right calcaneus. Hip discomfort started in  february/march but thought it had to do with her limping d/t ankle pain.    Pertinent History RA. On coumadin. AVN right calcaneus. last chemo for recurrent ovarian cancer (has had since 2016, also has had hysterectomy) was March 11th and follow up with MD again July 15th. HTN, hyperlipedmia, Hypothyroidism. Angina but treated with amlodipine. Electrolyte imbalance hx (takes supplements). Get claustrophobic. Neuropathy of hands and feet    Limitations Walking;Sitting    How long can you sit comfortably? depends on what she is doing, is nagging pain    Patient Stated Goals to decrease pain and discomfort. Be able to do all that she would like too.    Currently in Pain? Yes    Pain Score 2     Pain Location Hip    Pain Orientation Right    Pain Descriptors / Indicators Aching;Throbbing    Pain Radiating Towards sometimes goes down leg to above posterior  knee    Pain Onset More than a month ago    Aggravating Factors  sitting/pressure on it for too long.              [facility] PT Assessment - 10/24/20 1349      Assessment   Medical  Diagnosis M25.551 (ICD-10-CM) - Pain in right hip  M54.41 (ICD-10-CM) - Lumbago with sciatica, right side  G89.29 (ICD-10-CM) - Other chronic pain    Referring Provider (PT) Ninfa Linden    Hand Dominance Right    Next MD Visit July 7th      Precautions   Precaution Comments per pt no WB precautions for AVN      Balance Screen   Has the patient fallen in the past 6 months No    Has the patient had a decrease in activity level because of a fear of falling?  No   has has a few dizzy spells   Is the patient reluctant to leave their home because of a fear of falling?  No      Home Environment   Additional Comments house. 1 level. 1 small step house to garage - needs to walk cautiously and hold on.      Prior Function   Vocation On disability    Vocation Requirements Previously a paramedic    Leisure walking.      Cognition   Overall Cognitive Status Within  Functional Limits for tasks assessed      Observation/Other Assessments   Focus on Therapeutic Outcomes (FOTO)  54%      ROM / Strength   AROM / PROM / Strength AROM;Strength      AROM   Overall AROM Comments Lumbar: extension/flex repeated x10 increased peripheral syptoms. ext 25%, flexion 75%. Hip AROM grossly [facility]      Strength   Overall Strength Comments Grossly 4+/5 LLE, 4/5 RLE.      Flexibility   Soft Tissue Assessment /Muscle Length yes    Hamstrings very tight R>L    ITB tight R>L    Piriformis tight R>L      Palpation   Spinal mobility grossly hypomobile      Ambulation/Gait   Gait Comments Antalgc/slow- no AD. decreased WB and WS on right side. Trunk held stiffly.      Standardized Balance Assessment   Standardized Balance Assessment Five Times Sit to Stand;Timed Up and Go Test    Five times sit to stand comments  21 seconds wt shifted to the left.                      Objective measurements completed on examination: See above findings.               PT Education - 10/24/20 1726    Education Details Initial PT POC. Access Code: [email or web address]  URL: [email or web address]  Date: 10/24/2020  Prepared by: Sherlynn Stalls    Exercises  Heat - 1 x daily - 7 x weekly - 10-20 minutes hold  Supine Single Knee to Chest Stretch - 1 x daily - 7 x weekly - 3 sets - 20-30 sec hold  Supine Piriformis Stretch with Leg Straight - 1 x daily - 7 x weekly - 3 sets - 20-30 sec hold  Supine Hamstring Stretch with Strap - 1 x daily - 7 x weekly - 10 reps - 10 seconds hold    Person(s) Educated Patient    Methods Explanation;Demonstration    Comprehension Verbalized understanding;Returned demonstration            PT Short Term Goals - 10/24/20 1720      PT SHORT TERM GOAL #1   Title independent with initial HEP    Time 2    Period Weeks    Status New  Target Date 11/07/20             PT Long Term Goals - 10/24/20 1721      PT LONG TERM  GOAL #1   Title Independent with advanced HEP    Time 8    Period Weeks    Status New      PT LONG TERM GOAL #2   Title Improved 5TSTS to at least 10 seconds    Time 8    Period Weeks    Target Date 12/19/20      PT LONG TERM GOAL #3   Title Pt will negotiate stairs with symmetrical, reciprocal pattern    Time 8    Period Weeks    Status New    Target Date 12/19/20      PT LONG TERM GOAL #4   Title FOTO improved to at least 63% for low back and sciatic pain to demonstrate improved function    Time 8    Period Weeks    Status New      PT LONG TERM GOAL #5   Title Pt will report  50%  improvement in low back, right hip/leg pain with ADLs and ambulation    Time 8    Period Weeks    Status New                  Plan - 10/24/20 1433    Clinical Impression Statement Pt is a 60 yo female who presents with LBP with R sided sciatic pain that radiates to posterior thigh, in addition to right hip pain and heel pain d/t calcaneal AVN. Pt currently presents with decreased core and proximal hip strength, decreased ROM, mms tightness, decreased functional LE strength as per 5TSTS, decreased functional balance and gait. PMH significant for RA, recurrent ovarian CA, HTN, and R calcaneal AVN. Pt will benefit from skilled physcial therapy to work on improving functional mobility, strength and decrease pain.    Stability/Clinical Decision Making Evolving/Moderate complexity    Clinical Decision Making Moderate    Rehab Potential Good    PT Frequency --   1-2x/wk. Pt opting to start at 1x/wk d/t high copay   PT Duration 8 weeks    PT Treatment/Interventions ADLs/Self Care Home Management;Cryotherapy;Electrical Stimulation;Moist Heat;Gait training;Neuromuscular re-education;Therapeutic exercise;Therapeutic activities;Functional mobility training;Patient/family education;Manual techniques;Energy conservation;Dry needling;Taping    PT Next Visit Plan Reassess HEP. gradually progress LE/trunk  mobility as tolerated. LE flexibility. Manual and modalities as needed.    PT Home Exercise Plan Initial HEP provided with emphasis on stretching, mobility. Also discussed potential benefit of use of SPC to help offload right heel  and decrease pain with gait/WB.    Consulted and Agree with Plan of Care Patient           Patient will benefit from skilled therapeutic intervention in order to improve the following deficits and impairments:  Abnormal gait,Difficulty walking,Pain,Improper body mechanics,Impaired flexibility,Decreased balance,Decreased mobility,Decreased strength  Visit Diagnosis: Low back pain with right-sided sciatica, unspecified back pain laterality, unspecified chronicity - Plan: PT plan of care cert/re-cert  Pain in right hip - Plan: PT plan of care cert/re-cert  Pain in right ankle and joints of right foot - Plan: PT plan of care cert/re-cert  Muscle weakness (generalized) - Plan: PT plan of care cert/re-cert  Other abnormalities of gait and mobility - Plan: PT plan of care cert/re-cert     Problem List Patient Active Problem List   Diagnosis Date Noted  . Hypokalemia   .  Hypomagnesemia   . Hypocalcemia   . Thrombocytopenia ([place])   . Hypertension   . Malignant neoplasm of ovary ([place])   . Symptomatic anemia 08/15/2020  . Rheumatoid arthritis ([place])   . Arterial thrombosis ([place])   . Nodule of vagina 02/03/2017  . Pulmonary nodule 02/03/2017  . Primary malignant neoplasm of ovary ([place]) 12/07/2014  . Hypothyroidism 12/03/2012  . HLD (hyperlipidemia) 12/03/2012  . OVERWEIGHT 06/22/2007  . NICOTINE ADDICTION 06/22/2007  . DEPRESSION 06/22/2007  . Essential hypertension 06/22/2007  . GERD 06/22/2007  . OSTEOARTHRITIS 06/22/2007    Hall Busing, PT, DPT 10/24/2020, 5:28 PM  [facility] [place]. [place], [place], [address] Phone: [phone]   Fax:  [phone]  Name: NOVI CALIA MRN:  [id number] Date of Birth: [birth year]

## 2020-10-24 NOTE — Patient Instructions (Signed)
Access Code: [email or web address] URL: [email or web address] Date: 10/24/2020 Prepared by: Sherlynn Stalls  Exercises Heat - 1 x daily - 7 x weekly - 10-20 minutes hold Supine Single Knee to Chest Stretch - 1 x daily - 7 x weekly - 3 sets - 20-30 sec hold Supine Piriformis Stretch with Leg Straight - 1 x daily - 7 x weekly - 3 sets - 20-30 sec hold Supine Hamstring Stretch with Strap - 1 x daily - 7 x weekly - 10 reps - 10 seconds hold

## 2020-10-30 ENCOUNTER — Ambulatory Visit: Payer: Medicare (Managed Care) | Admitting: Physical Therapy

## 2020-11-06 ENCOUNTER — Ambulatory Visit: Payer: Medicare (Managed Care) | Admitting: Physical Therapy

## 2020-11-09 DIAGNOSIS — M79671 Pain in right foot: Secondary | ICD-10-CM | POA: Diagnosis not present

## 2020-11-09 DIAGNOSIS — M545 Low back pain, unspecified: Secondary | ICD-10-CM | POA: Diagnosis not present

## 2020-11-09 DIAGNOSIS — M25551 Pain in right hip: Secondary | ICD-10-CM | POA: Diagnosis not present

## 2020-11-13 ENCOUNTER — Ambulatory Visit: Payer: Medicare (Managed Care) | Admitting: Physical Therapy

## 2020-11-13 DIAGNOSIS — M79671 Pain in right foot: Secondary | ICD-10-CM | POA: Diagnosis not present

## 2020-11-13 DIAGNOSIS — M25551 Pain in right hip: Secondary | ICD-10-CM | POA: Diagnosis not present

## 2020-11-13 DIAGNOSIS — M545 Low back pain, unspecified: Secondary | ICD-10-CM | POA: Diagnosis not present

## 2020-11-16 DIAGNOSIS — M25551 Pain in right hip: Secondary | ICD-10-CM | POA: Diagnosis not present

## 2020-11-16 DIAGNOSIS — M545 Low back pain, unspecified: Secondary | ICD-10-CM | POA: Diagnosis not present

## 2020-11-16 DIAGNOSIS — M79671 Pain in right foot: Secondary | ICD-10-CM | POA: Diagnosis not present

## 2020-11-20 ENCOUNTER — Ambulatory Visit: Payer: Medicare (Managed Care)

## 2020-11-20 DIAGNOSIS — M545 Low back pain, unspecified: Secondary | ICD-10-CM | POA: Diagnosis not present

## 2020-11-20 DIAGNOSIS — M25551 Pain in right hip: Secondary | ICD-10-CM | POA: Diagnosis not present

## 2020-11-20 DIAGNOSIS — M79671 Pain in right foot: Secondary | ICD-10-CM | POA: Diagnosis not present

## 2020-11-22 ENCOUNTER — Ambulatory Visit: Payer: Medicare (Managed Care) | Admitting: Orthopaedic Surgery

## 2020-11-22 DIAGNOSIS — M79671 Pain in right foot: Secondary | ICD-10-CM | POA: Diagnosis not present

## 2020-11-22 DIAGNOSIS — M545 Low back pain, unspecified: Secondary | ICD-10-CM | POA: Diagnosis not present

## 2020-11-22 DIAGNOSIS — M25551 Pain in right hip: Secondary | ICD-10-CM | POA: Diagnosis not present

## 2020-11-27 DIAGNOSIS — M79671 Pain in right foot: Secondary | ICD-10-CM | POA: Diagnosis not present

## 2020-11-27 DIAGNOSIS — M25551 Pain in right hip: Secondary | ICD-10-CM | POA: Diagnosis not present

## 2020-11-27 DIAGNOSIS — M545 Low back pain, unspecified: Secondary | ICD-10-CM | POA: Diagnosis not present

## 2020-11-28 ENCOUNTER — Encounter: Payer: Self-pay | Admitting: Orthopaedic Surgery

## 2020-11-28 ENCOUNTER — Ambulatory Visit (INDEPENDENT_AMBULATORY_CARE_PROVIDER_SITE_OTHER): Payer: Medicare (Managed Care) | Admitting: Orthopaedic Surgery

## 2020-11-28 ENCOUNTER — Other Ambulatory Visit: Payer: Self-pay

## 2020-11-28 DIAGNOSIS — M5441 Lumbago with sciatica, right side: Secondary | ICD-10-CM | POA: Diagnosis not present

## 2020-11-28 DIAGNOSIS — M25551 Pain in right hip: Secondary | ICD-10-CM | POA: Diagnosis not present

## 2020-11-28 DIAGNOSIS — G8929 Other chronic pain: Secondary | ICD-10-CM

## 2020-11-28 NOTE — Progress Notes (Signed)
The patient is a very pleasant 60 year old female that I am seeing in follow-up for after having physical therapy for right-sided low back pain and sciatica as well as some right hip pain.  She was referred to me from Dr. Sharol Given to consider hip replacement surgery.  She had arthritic changes on x-ray of her right hip.  She was denying groin pain and on my exam, her right hip moves smoothly and fluidly with full rotation and no stiffness of the hip joint and no pain in the groin.  Her symptoms and signs were more consistent with sciatica.  Physical therapy has helped her quite a bit.  She is now experiencing just a little bit of right hip pain and she is ambulate with a cane.  Her bigger problem is heel pain and known avascular necrosis of her calcaneus.  Examination right hip today again shows it moves smoothly and fluidly with no pain in the groin at all.  She is let me know that she does get occasional groin pain.  Right now I will have her continue physical therapy for her lumbar spine and sciatica as well as hip strengthening.  My next step for her would be considering an intra-articular injection of the right hip if she continues to develop any worsening groin pain.  Right now she states that she does not need any further treatment for her hip other than the therapy that she is having for her back and hip.  I will see her back in about 3 months to see how she is doing overall.  All questions and concerns were answered and addressed.  I encouraged her to seek follow-up with Dr. Sharol Given for her calcaneus AVN.

## 2020-11-30 DIAGNOSIS — C563 Malignant neoplasm of bilateral ovaries: Secondary | ICD-10-CM | POA: Diagnosis not present

## 2020-12-03 ENCOUNTER — Ambulatory Visit: Payer: Managed Care, Other (non HMO) | Admitting: Family Medicine

## 2020-12-04 ENCOUNTER — Encounter: Payer: Self-pay | Admitting: Orthopedic Surgery

## 2020-12-04 ENCOUNTER — Ambulatory Visit (INDEPENDENT_AMBULATORY_CARE_PROVIDER_SITE_OTHER): Payer: Medicare (Managed Care) | Admitting: Orthopedic Surgery

## 2020-12-04 VITALS — Ht 67.0 in | Wt 240.0 lb

## 2020-12-04 DIAGNOSIS — M25551 Pain in right hip: Secondary | ICD-10-CM | POA: Diagnosis not present

## 2020-12-04 DIAGNOSIS — M79671 Pain in right foot: Secondary | ICD-10-CM

## 2020-12-04 DIAGNOSIS — M87076 Idiopathic aseptic necrosis of unspecified foot: Secondary | ICD-10-CM

## 2020-12-04 DIAGNOSIS — M545 Low back pain, unspecified: Secondary | ICD-10-CM | POA: Diagnosis not present

## 2020-12-05 ENCOUNTER — Encounter: Payer: Self-pay | Admitting: Orthopedic Surgery

## 2020-12-05 NOTE — Progress Notes (Signed)
Office Visit Note   Patient: Donna [name]           Date of Birth: Oct 09, 1960           MRN: [id number] Visit Date: 12/04/2020              Requested by: Susy Frizzle, MD [address] [place],  [place] [address] PCP: Susy Frizzle, MD  Chief Complaint  Patient presents with   Right Foot - Follow-up      HPI: Patient is a 60 year old woman who presents with persistent pain and swelling right ankle.  Patient states that some days the ankle is worse some days that he will is painful sometimes she has pain over the midfoot.  Patient is undergoing chemotherapy treatment in September.  Patient states she is taken tramadol without relief.  Assessment & Plan: Visit Diagnoses:  1. Pain in right foot   2. 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  Discussed that we could proceed with a tibial calcaneal fusion but I would think she would still have some pain from the avascular necrosis of the calcaneus.  Three-view radiographs of the right ankle at follow-up.  Follow-Up Instructions: Return in about 2 months (around 02/04/2021).   Ortho Exam  Patient is alert, oriented, no adenopathy, well-dressed, normal affect, normal respiratory effort. Examination patient does have an effusion of the right ankle.  She has had steroid injections x2 without relief.  Review of her MRI scan shows avascular necrosis of the tibia talus and calcaneus.  Patient has no periarticular cystic changes no radiographic studies consistent with gout.  Patient states she has had a uric acid drawn that was negative for gout as well.  She states she does have positive rheumatoid arthritic markers.  Today patient is most painful to palpation over the ankle joint anteriorly.  Imaging: No results found. No images are attached to the encounter.  Labs: Lab Results  Component Value Date   HGBA1C 5.2 08/14/2020    HGBA1C 5.3 11/04/2017   ESRSEDRATE 58 (H) 08/23/2020   ESRSEDRATE 45 (H) 05/01/2020   ESRSEDRATE 25 07/27/2018   CRP 5.7 08/23/2020   CRP 9.4 (H) 05/01/2020   LABURIC 7.6 (H) 06/10/2018     Lab Results  Component Value Date   ALBUMIN 3.6 08/17/2020   ALBUMIN 3.5 08/16/2020   ALBUMIN 3.9 08/15/2020    Lab Results  Component Value Date   MG 1.4 (L) 09/24/2020   MG 1.3 (L) 08/23/2020   MG 1.9 08/17/2020   Lab Results  Component Value Date   VD25OH 57.25 08/17/2020    No results found for: PREALBUMIN CBC EXTENDED Latest Ref Rng & Units 08/23/2020 08/17/2020 08/16/2020  WBC 3.8 - 10.8 Thousand/uL 4.6 3.2(L) 3.1(L)  RBC 3.80 - 5.10 Million/uL 2.82(L) 2.62(L) 2.56(L)  HGB 11.7 - 15.5 g/dL 9.4(L) 8.8(L) 8.5(L)  HCT 35.0 - 45.0 % 27.8(L) 27.5(L) 25.9(L)  PLT 140 - 400 Thousand/uL 136(L) 86(L) 82(L)  NEUTROABS 1,500 - 7,800 cells/uL 2,631 - -  LYMPHSABS 850 - 3,900 cells/uL 1,417 - -     Body mass index is 37.59 kg/m.  Orders:  No orders of the defined types were placed in this encounter.  No orders of the defined types were placed in this encounter.    Procedures: No procedures performed  Clinical Data: No additional findings.  ROS:  All other systems  negative, except as noted in the HPI. Review of Systems  Objective: Vital Signs: Ht 5\' 7"  (1.702 m)   Wt 240 lb (108.9 kg)   BMI 37.59 kg/m   Specialty Comments:  No specialty comments available.  PMFS History: Patient Active Problem List   Diagnosis Date Noted   Hypokalemia    Hypomagnesemia    Hypocalcemia    Thrombocytopenia (HCC)    Hypertension    Malignant neoplasm of ovary (HCC)    Symptomatic anemia 08/15/2020   Rheumatoid arthritis ([place])    Arterial thrombosis (HCC)    Nodule of vagina 02/03/2017   Pulmonary nodule 02/03/2017   Primary malignant neoplasm of ovary ([place]) 12/07/2014   Hypothyroidism 12/03/2012   HLD (hyperlipidemia) 12/03/2012   OVERWEIGHT 06/22/2007   NICOTINE ADDICTION  06/22/2007   DEPRESSION 06/22/2007   Essential hypertension 06/22/2007   GERD 06/22/2007   OSTEOARTHRITIS 06/22/2007   Past Medical History:  Diagnosis Date   Anxiety    Arterial thrombosis (HCC)    right sfa, popliteal artery.  Require thromectomy at [facility] and lifelong anticoagulation.   Cancer (HCC)    ovarian, recurrent on chemo (carboplatin, doxorubicin) at [facility]   Colon polyp    Depression    Full dentures    GERD (gastroesophageal reflux disease)    HLD (hyperlipidemia) 12/03/2012   Hypertension    Hypothyroidism    IBS (irritable bowel syndrome)    Neck mass    Neuropathy    feet   Rheumatoid arthritis ([place])    at [employer] ring    Wears glasses     Family History  Problem Relation Age of Onset   Heart attack Mother    Hypertension Mother    Diabetes Mother    Heart disease Mother    CVA Father    Hypertension Father    Diabetes Father    Stroke Father    Cancer Brother        lung cancer - stage 58   Cancer Brother        skin    Heart disease Maternal Grandfather    Heart attack Sister        CABG   Diabetes Sister    Stroke Sister    Hypertension Sister    Hyperlipidemia Sister    Kidney disease Sister    Stroke Sister    Heart attack Sister     Past Surgical History:  Procedure Laterality Date   ABDOMINAL HYSTERECTOMY     ABLATION     COLONOSCOPY     CYST REMOVAL NECK  05/13/12   EAR CYST EXCISION  05/13/2012   Procedure: CYST REMOVAL;  Surgeon: Ralene Ok, MD;  Location: [facility];  Service: General;  Laterality: Left;  excision of neck cyst   THYROID SURGERY  10/2011 - approximate   ablation   TONSILLECTOMY     UPPER GI ENDOSCOPY     Social History   Occupational History   Not on file  Tobacco Use   Smoking status: Former    Years: 32.00    Types: Cigarettes    Quit date: 06/19/2011    Years since quitting: 9.4   Smokeless tobacco: Never  Vaping Use   Vaping Use: Never used  Substance and Sexual Activity   Alcohol use:  No   Drug use: No   Sexual activity: Yes

## 2020-12-11 DIAGNOSIS — M545 Low back pain, unspecified: Secondary | ICD-10-CM | POA: Diagnosis not present

## 2020-12-11 DIAGNOSIS — M25551 Pain in right hip: Secondary | ICD-10-CM | POA: Diagnosis not present

## 2020-12-11 DIAGNOSIS — M79671 Pain in right foot: Secondary | ICD-10-CM | POA: Diagnosis not present

## 2020-12-14 ENCOUNTER — Ambulatory Visit: Payer: Managed Care, Other (non HMO) | Admitting: Family Medicine

## 2020-12-19 ENCOUNTER — Telehealth: Payer: Self-pay | Admitting: *Deleted

## 2020-12-19 MED ORDER — ATORVASTATIN CALCIUM 80 MG PO TABS: 80.0000 mg | ORAL_TABLET | Freq: Every day | ORAL | 3 refills | Status: DC

## 2020-12-19 NOTE — Telephone Encounter (Signed)
Received fax requesting alternative to Atorvastatin 40 mg (2) tabs PO QHS. Reports that this dose exceeds QL of 30/30.   Prescription sent to pharmacy for Atorvastatin '80mg'$  PO Q HS.

## 2020-12-21 ENCOUNTER — Other Ambulatory Visit: Payer: Self-pay

## 2020-12-21 ENCOUNTER — Ambulatory Visit (INDEPENDENT_AMBULATORY_CARE_PROVIDER_SITE_OTHER): Payer: Medicare (Managed Care) | Admitting: Family Medicine

## 2020-12-21 ENCOUNTER — Other Ambulatory Visit: Payer: Self-pay | Admitting: Family Medicine

## 2020-12-21 ENCOUNTER — Encounter: Payer: Self-pay | Admitting: Family Medicine

## 2020-12-21 VITALS — BP 128/66 | HR 82 | Temp 98.6°F | Resp 14 | Ht 67.0 in | Wt 236.0 lb

## 2020-12-21 DIAGNOSIS — Z7901 Long term (current) use of anticoagulants: Secondary | ICD-10-CM

## 2020-12-21 DIAGNOSIS — I749 Embolism and thrombosis of unspecified artery: Secondary | ICD-10-CM | POA: Diagnosis not present

## 2020-12-21 LAB — PT WITH INR/FINGERSTICK
INR, fingerstick: 3.6 ratio — ABNORMAL HIGH
PT, fingerstick: 42.6 s — ABNORMAL HIGH (ref 10.5–13.1)

## 2020-12-21 NOTE — Progress Notes (Signed)
Subjective:    Patient ID: Donna [name], female    DOB: [birth year], 60 y.o.   MRN: [id number]  Patient is a very pleasant 60 year old Caucasian female here today to recheck her INR.  She is currently taking 10 mg of Coumadin daily.  Her INR today is supratherapeutic at 3.6. Past Medical History:  Diagnosis Date   Anxiety    Arterial thrombosis (HCC)    right sfa, popliteal artery.  Require thromectomy at [facility] and lifelong anticoagulation.   Cancer (HCC)    ovarian, recurrent on chemo (carboplatin, doxorubicin) at [facility]   Colon polyp    Depression    Full dentures    GERD (gastroesophageal reflux disease)    HLD (hyperlipidemia) 12/03/2012   Hypertension    Hypothyroidism    IBS (irritable bowel syndrome)    Neck mass    Neuropathy    feet   Rheumatoid arthritis ([place])    at [facility] ring    Wears glasses    Past Surgical History:  Procedure Laterality Date   ABDOMINAL HYSTERECTOMY     ABLATION     COLONOSCOPY     CYST REMOVAL NECK  05/13/12   EAR CYST EXCISION  05/13/2012   Procedure: CYST REMOVAL;  Surgeon: Ralene Ok, MD;  Location: [facility];  Service: General;  Laterality: Left;  excision of neck cyst   THYROID SURGERY  10/2011 - approximate   ablation   TONSILLECTOMY     UPPER GI ENDOSCOPY     Current Outpatient Medications on File Prior to Visit  Medication Sig Dispense Refill   amLODipine (NORVASC) 10 MG tablet Take 1 tablet by mouth once daily 90 tablet 0   atorvastatin (LIPITOR) 80 MG tablet Take 1 tablet (80 mg total) by mouth daily. 90 tablet 3   calcium carbonate (OS-CAL - DOSED IN MG OF ELEMENTAL CALCIUM) 1250 (500 Ca) MG tablet Take 1 tablet (500 mg of elemental calcium total) by mouth 3 (three) times daily with meals. 90 tablet 0   Cholecalciferol (VITAMIN D3) 50 MCG (2000 UT) capsule Take 2,000 Units by mouth daily.     diphenhydrAMINE (BENADRYL) 25 MG tablet Take 25 mg by mouth at bedtime as needed. For sleep     gabapentin (NEURONTIN) 300 MG  capsule Take 1 capsule (300 mg total) by mouth 2 (two) times daily. 108 capsule 3   leflunomide (ARAVA) 20 MG tablet Take 20 mg by mouth daily.     levothyroxine (SYNTHROID) 175 MCG tablet TAKE 1 TABLET BY MOUTH ONCE DAILY BEFORE BREAKFAST 90 tablet 0   losartan (COZAAR) 50 MG tablet Take 1 tablet (50 mg total) by mouth daily. 90 tablet 0   methocarbamol (ROBAXIN) 500 MG tablet Take 1 tablet (500 mg total) by mouth every 6 (six) hours as needed. 40 tablet 1   omeprazole (PRILOSEC) 40 MG capsule Take 1 capsule (40 mg total) by mouth in the morning and at bedtime. 60 capsule 3   ondansetron (ZOFRAN) 8 MG tablet Take 8 mg by mouth daily as needed for nausea, vomiting or refractory nausea / vomiting.     potassium chloride SA (KLOR-CON) 20 MEQ tablet Take 2 tablets (40 mEq total) by mouth daily. 90 tablet 3   vitamin B-12 (CYANOCOBALAMIN) 1000 MCG tablet Take 1,000 mcg by mouth daily.     WARFARIN SODIUM PO Take 12.5 mg by mouth daily.     No current facility-administered medications on file prior to visit.   Allergies  Allergen Reactions   Paclitaxel Other (See Comments)    Flushing, Itching, Throat tightness   Social History   Socioeconomic History   Marital status: Single    Spouse name: Not on file   Number of children: Not on file   Years of education: Not on file   Highest education level: Not on file  Occupational History   Not on file  Tobacco Use   Smoking status: Former    Years: 32.00    Types: Cigarettes    Quit date: 06/19/2011    Years since quitting: 9.5   Smokeless tobacco: Never  Vaping Use   Vaping Use: Never used  Substance and Sexual Activity   Alcohol use: No   Drug use: No   Sexual activity: Yes  Other Topics Concern   Not on file  Social History Narrative   Not on file   Social Determinants of Health   Financial Resource Strain: Not on file  Food Insecurity: Not on file  Transportation Needs: Not on file  Physical Activity: Not on file  Stress:  Not on file  Social Connections: Not on file  Intimate Partner Violence: Not on file      Review of Systems  All other systems reviewed and are negative.     Objective:   Physical Exam Vitals reviewed.  Constitutional:      Appearance: She is normal weight.  Cardiovascular:     Rate and Rhythm: Normal rate and regular rhythm.     Heart sounds: Normal heart sounds.  Pulmonary:     Effort: Pulmonary effort is normal.     Breath sounds: Normal breath sounds.  Musculoskeletal:     Right lower leg: No edema.     Left lower leg: No edema.  Neurological:     Mental Status: She is alert.          Assessment & Plan:  Arterial thrombosis ([place]) - Plan: PT with INR/Fingerstick, PT with INR/Fingerstick  Anticoagulant long-term use - Plan: PT with INR/Fingerstick, PT with INR/Fingerstick Decrease Coumadin to 10 mg every day except Monday and Friday.  On Monday and Friday take 5 mg and recheck in 4 to 6 weeks

## 2020-12-31 DIAGNOSIS — G629 Polyneuropathy, unspecified: Secondary | ICD-10-CM | POA: Diagnosis not present

## 2020-12-31 DIAGNOSIS — M069 Rheumatoid arthritis, unspecified: Secondary | ICD-10-CM | POA: Diagnosis not present

## 2020-12-31 DIAGNOSIS — Z79899 Other long term (current) drug therapy: Secondary | ICD-10-CM | POA: Diagnosis not present

## 2020-12-31 DIAGNOSIS — K449 Diaphragmatic hernia without obstruction or gangrene: Secondary | ICD-10-CM | POA: Diagnosis not present

## 2020-12-31 DIAGNOSIS — Z0000 Encounter for general adult medical examination without abnormal findings: Secondary | ICD-10-CM | POA: Diagnosis not present

## 2020-12-31 DIAGNOSIS — C563 Malignant neoplasm of bilateral ovaries: Secondary | ICD-10-CM | POA: Diagnosis not present

## 2020-12-31 DIAGNOSIS — E876 Hypokalemia: Secondary | ICD-10-CM | POA: Diagnosis not present

## 2020-12-31 DIAGNOSIS — Z5111 Encounter for antineoplastic chemotherapy: Secondary | ICD-10-CM | POA: Diagnosis not present

## 2021-01-04 DIAGNOSIS — Z5111 Encounter for antineoplastic chemotherapy: Secondary | ICD-10-CM | POA: Diagnosis not present

## 2021-01-04 DIAGNOSIS — C563 Malignant neoplasm of bilateral ovaries: Secondary | ICD-10-CM | POA: Diagnosis not present

## 2021-01-18 DIAGNOSIS — Z5111 Encounter for antineoplastic chemotherapy: Secondary | ICD-10-CM | POA: Diagnosis not present

## 2021-01-18 DIAGNOSIS — C563 Malignant neoplasm of bilateral ovaries: Secondary | ICD-10-CM | POA: Diagnosis not present

## 2021-01-28 ENCOUNTER — Other Ambulatory Visit: Payer: Self-pay | Admitting: Family Medicine

## 2021-02-01 DIAGNOSIS — R10811 Right upper quadrant abdominal tenderness: Secondary | ICD-10-CM | POA: Diagnosis not present

## 2021-02-01 DIAGNOSIS — Z79899 Other long term (current) drug therapy: Secondary | ICD-10-CM | POA: Diagnosis not present

## 2021-02-01 DIAGNOSIS — C786 Secondary malignant neoplasm of retroperitoneum and peritoneum: Secondary | ICD-10-CM | POA: Diagnosis not present

## 2021-02-01 DIAGNOSIS — G629 Polyneuropathy, unspecified: Secondary | ICD-10-CM | POA: Diagnosis not present

## 2021-02-01 DIAGNOSIS — Z9221 Personal history of antineoplastic chemotherapy: Secondary | ICD-10-CM | POA: Diagnosis not present

## 2021-02-01 DIAGNOSIS — R110 Nausea: Secondary | ICD-10-CM | POA: Diagnosis not present

## 2021-02-01 DIAGNOSIS — C563 Malignant neoplasm of bilateral ovaries: Secondary | ICD-10-CM | POA: Diagnosis not present

## 2021-02-01 DIAGNOSIS — K449 Diaphragmatic hernia without obstruction or gangrene: Secondary | ICD-10-CM | POA: Diagnosis not present

## 2021-02-01 DIAGNOSIS — Z7901 Long term (current) use of anticoagulants: Secondary | ICD-10-CM | POA: Diagnosis not present

## 2021-02-01 DIAGNOSIS — R1011 Right upper quadrant pain: Secondary | ICD-10-CM | POA: Diagnosis not present

## 2021-02-01 DIAGNOSIS — Z5111 Encounter for antineoplastic chemotherapy: Secondary | ICD-10-CM | POA: Diagnosis not present

## 2021-02-01 DIAGNOSIS — K5900 Constipation, unspecified: Secondary | ICD-10-CM | POA: Diagnosis not present

## 2021-02-01 DIAGNOSIS — R1013 Epigastric pain: Secondary | ICD-10-CM | POA: Diagnosis not present

## 2021-02-01 DIAGNOSIS — R5383 Other fatigue: Secondary | ICD-10-CM | POA: Diagnosis not present

## 2021-02-01 DIAGNOSIS — Z90722 Acquired absence of ovaries, bilateral: Secondary | ICD-10-CM | POA: Diagnosis not present

## 2021-02-04 ENCOUNTER — Ambulatory Visit: Payer: Medicare (Managed Care) | Admitting: Orthopedic Surgery

## 2021-02-15 DIAGNOSIS — Z5111 Encounter for antineoplastic chemotherapy: Secondary | ICD-10-CM | POA: Diagnosis not present

## 2021-02-15 DIAGNOSIS — Z79899 Other long term (current) drug therapy: Secondary | ICD-10-CM | POA: Diagnosis not present

## 2021-02-15 DIAGNOSIS — C563 Malignant neoplasm of bilateral ovaries: Secondary | ICD-10-CM | POA: Diagnosis not present

## 2021-02-16 ENCOUNTER — Other Ambulatory Visit: Payer: Self-pay | Admitting: Family Medicine

## 2021-02-19 ENCOUNTER — Ambulatory Visit (INDEPENDENT_AMBULATORY_CARE_PROVIDER_SITE_OTHER): Payer: Medicare (Managed Care) | Admitting: Family Medicine

## 2021-02-19 ENCOUNTER — Other Ambulatory Visit: Payer: Self-pay

## 2021-02-19 VITALS — BP 138/74 | HR 90 | Temp 99.1°F | Resp 16 | Ht 67.0 in | Wt 233.0 lb

## 2021-02-19 DIAGNOSIS — J329 Chronic sinusitis, unspecified: Secondary | ICD-10-CM | POA: Diagnosis not present

## 2021-02-19 DIAGNOSIS — I749 Embolism and thrombosis of unspecified artery: Secondary | ICD-10-CM | POA: Diagnosis not present

## 2021-02-19 DIAGNOSIS — J310 Chronic rhinitis: Secondary | ICD-10-CM | POA: Diagnosis not present

## 2021-02-19 DIAGNOSIS — Z7901 Long term (current) use of anticoagulants: Secondary | ICD-10-CM | POA: Diagnosis not present

## 2021-02-19 LAB — PT WITH INR/FINGERSTICK
INR, fingerstick: 3.1 ratio — ABNORMAL HIGH
PT, fingerstick: 37.3 s — ABNORMAL HIGH (ref 10.5–13.1)

## 2021-02-19 MED ORDER — LOSARTAN POTASSIUM 50 MG PO TABS: 50.0000 mg | ORAL_TABLET | Freq: Every day | ORAL | 0 refills | Status: DC

## 2021-02-19 MED ORDER — AMOXICILLIN-POT CLAVULANATE 875-125 MG PO TABS: 1.0000 | ORAL_TABLET | Freq: Two times a day (BID) | ORAL | 0 refills | Status: DC

## 2021-02-19 NOTE — Progress Notes (Signed)
Subjective:    Patient ID: Donna [name], female    DOB: [birth year], 60 y.o.   MRN: [id number]  Patient is a very pleasant 60 year old Caucasian female here today to recheck her INR.  Patient is back on chemotherapy.  About 2 weeks ago, the patient developed a sore throat.  This progressed into head congestion.  For the last 2 weeks she reports pain and pressure in her sinuses, constant drainage, and a dry nonproductive cough due to the drainage.  I performed a COVID test today in the office using a home test which was negative.  Patient denies any sore throat today.  She denies any chest pain or shortness of breath or dyspnea on exertion.  She does have an occasional dry cough but primarily is complaining of head congestion and fatigue and sinus pressure Past Medical History:  Diagnosis Date   Anxiety    Arterial thrombosis (HCC)    right sfa, popliteal artery.  Require thromectomy at [facility] and lifelong anticoagulation.   Cancer (HCC)    ovarian, recurrent on chemo (carboplatin, doxorubicin) at [facility]   Colon polyp    Depression    Full dentures    GERD (gastroesophageal reflux disease)    HLD (hyperlipidemia) 12/03/2012   Hypertension    Hypothyroidism    IBS (irritable bowel syndrome)    Neck mass    Neuropathy    feet   Rheumatoid arthritis ([place])    at [facility] ring    Wears glasses    Past Surgical History:  Procedure Laterality Date   ABDOMINAL HYSTERECTOMY     ABLATION     COLONOSCOPY     CYST REMOVAL NECK  05/13/12   EAR CYST EXCISION  05/13/2012   Procedure: CYST REMOVAL;  Surgeon: Ralene Ok, MD;  Location: [place];  Service: General;  Laterality: Left;  excision of neck cyst   THYROID SURGERY  10/2011 - approximate   ablation   TONSILLECTOMY     UPPER GI ENDOSCOPY     Current Outpatient Medications on File Prior to Visit  Medication Sig Dispense Refill   amLODipine (NORVASC) 10 MG tablet Take 1 tablet by mouth once daily 90 tablet 0   atorvastatin  (LIPITOR) 80 MG tablet Take 1 tablet (80 mg total) by mouth daily. 90 tablet 3   calcium carbonate (OS-CAL - DOSED IN MG OF ELEMENTAL CALCIUM) 1250 (500 Ca) MG tablet Take 1 tablet (500 mg of elemental calcium total) by mouth 3 (three) times daily with meals. 90 tablet 0   Cholecalciferol (VITAMIN D3) 50 MCG (2000 UT) capsule Take 2,000 Units by mouth daily.     diphenhydrAMINE (BENADRYL) 25 MG tablet Take 25 mg by mouth at bedtime as needed. For sleep     gabapentin (NEURONTIN) 300 MG capsule Take 1 capsule (300 mg total) by mouth 2 (two) times daily. 108 capsule 3   leflunomide (ARAVA) 20 MG tablet Take 20 mg by mouth daily.     levothyroxine (SYNTHROID) 175 MCG tablet TAKE 1 TABLET DAILY BEFORE BREAKFAST 90 tablet 3   losartan (COZAAR) 50 MG tablet Take 1 tablet (50 mg total) by mouth daily. 90 tablet 0   methocarbamol (ROBAXIN) 500 MG tablet Take 1 tablet (500 mg total) by mouth every 6 (six) hours as needed. 40 tablet 1   omeprazole (PRILOSEC) 40 MG capsule Take 1 capsule (40 mg total) by mouth in the morning and at bedtime. 60 capsule 3   ondansetron (ZOFRAN) 8  MG tablet Take 8 mg by mouth daily as needed for nausea, vomiting or refractory nausea / vomiting.     potassium chloride SA (KLOR-CON) 20 MEQ tablet Take 2 tablets (40 mEq total) by mouth daily. 90 tablet 3   vitamin B-12 (CYANOCOBALAMIN) 1000 MCG tablet Take 1,000 mcg by mouth daily.     WARFARIN SODIUM PO Take 12.5 mg by mouth daily.     No current facility-administered medications on file prior to visit.   Allergies  Allergen Reactions   Paclitaxel Other (See Comments)    Flushing, Itching, Throat tightness   Social History   Socioeconomic History   Marital status: Single    Spouse name: Not on file   Number of children: Not on file   Years of education: Not on file   Highest education level: Not on file  Occupational History   Not on file  Tobacco Use   Smoking status: Former    Years: 32.00    Types: Cigarettes     Quit date: 06/19/2011    Years since quitting: 9.6   Smokeless tobacco: Never  Vaping Use   Vaping Use: Never used  Substance and Sexual Activity   Alcohol use: No   Drug use: No   Sexual activity: Yes  Other Topics Concern   Not on file  Social History Narrative   Not on file   Social Determinants of Health   Financial Resource Strain: Not on file  Food Insecurity: Not on file  Transportation Needs: Not on file  Physical Activity: Not on file  Stress: Not on file  Social Connections: Not on file  Intimate Partner Violence: Not on file      Review of Systems  All other systems reviewed and are negative.     Objective:   Physical Exam Vitals reviewed.  Constitutional:      Appearance: She is normal weight.  HENT:     Right Ear: Tympanic membrane and ear canal normal.     Left Ear: Tympanic membrane and ear canal normal.     Nose:     Right Turbinates: Swollen.     Left Turbinates: Swollen.     Right Sinus: No maxillary sinus tenderness or frontal sinus tenderness.     Left Sinus: Frontal sinus tenderness present. No maxillary sinus tenderness.     Mouth/Throat:     Mouth: Mucous membranes are moist.     Pharynx: No oropharyngeal exudate.  Cardiovascular:     Rate and Rhythm: Normal rate and regular rhythm.     Heart sounds: Normal heart sounds.  Pulmonary:     Effort: Pulmonary effort is normal.     Breath sounds: Normal breath sounds.  Musculoskeletal:     Right lower leg: No edema.     Left lower leg: No edema.  Neurological:     Mental Status: She is alert.          Assessment & Plan:  Arterial thrombosis (HCC) - Plan: PT with INR/Fingerstick  Anticoagulant long-term use - Plan: PT with INR/Fingerstick  Rhinosinusitis I will not adjust her Coumadin.  I believe the INR is acceptable per tickly for patient with an arterial thrombus.  I do feel that she has a sinus infection.  Most likely this started as a virus but I believe is turned into a  secondary sinus infection.  Furthermore concerned due to her compromised immune system on chemotherapy so I will treat the patient with Augmentin 875 mg twice daily  for 10 days

## 2021-02-28 ENCOUNTER — Ambulatory Visit: Payer: Medicare (Managed Care) | Admitting: Orthopaedic Surgery

## 2021-03-01 DIAGNOSIS — C563 Malignant neoplasm of bilateral ovaries: Secondary | ICD-10-CM | POA: Diagnosis not present

## 2021-03-01 DIAGNOSIS — Z86718 Personal history of other venous thrombosis and embolism: Secondary | ICD-10-CM | POA: Diagnosis not present

## 2021-03-01 DIAGNOSIS — C482 Malignant neoplasm of peritoneum, unspecified: Secondary | ICD-10-CM | POA: Diagnosis not present

## 2021-03-01 DIAGNOSIS — R5383 Other fatigue: Secondary | ICD-10-CM | POA: Diagnosis not present

## 2021-03-01 DIAGNOSIS — Z5111 Encounter for antineoplastic chemotherapy: Secondary | ICD-10-CM | POA: Diagnosis not present

## 2021-03-01 DIAGNOSIS — K5900 Constipation, unspecified: Secondary | ICD-10-CM | POA: Diagnosis not present

## 2021-03-01 DIAGNOSIS — Z7901 Long term (current) use of anticoagulants: Secondary | ICD-10-CM | POA: Diagnosis not present

## 2021-03-01 DIAGNOSIS — Z8543 Personal history of malignant neoplasm of ovary: Secondary | ICD-10-CM | POA: Diagnosis not present

## 2021-03-01 DIAGNOSIS — R1011 Right upper quadrant pain: Secondary | ICD-10-CM | POA: Diagnosis not present

## 2021-03-01 DIAGNOSIS — R110 Nausea: Secondary | ICD-10-CM | POA: Diagnosis not present

## 2021-03-01 DIAGNOSIS — Z79899 Other long term (current) drug therapy: Secondary | ICD-10-CM | POA: Diagnosis not present

## 2021-03-07 DIAGNOSIS — M059 Rheumatoid arthritis with rheumatoid factor, unspecified: Secondary | ICD-10-CM | POA: Diagnosis not present

## 2021-03-07 DIAGNOSIS — Z87891 Personal history of nicotine dependence: Secondary | ICD-10-CM | POA: Diagnosis not present

## 2021-03-07 DIAGNOSIS — M87076 Idiopathic aseptic necrosis of unspecified foot: Secondary | ICD-10-CM | POA: Diagnosis not present

## 2021-03-07 DIAGNOSIS — E669 Obesity, unspecified: Secondary | ICD-10-CM | POA: Diagnosis not present

## 2021-03-07 DIAGNOSIS — F32A Depression, unspecified: Secondary | ICD-10-CM | POA: Diagnosis not present

## 2021-03-07 DIAGNOSIS — Z6836 Body mass index (BMI) 36.0-36.9, adult: Secondary | ICD-10-CM | POA: Diagnosis not present

## 2021-03-07 DIAGNOSIS — Z23 Encounter for immunization: Secondary | ICD-10-CM | POA: Diagnosis not present

## 2021-03-07 DIAGNOSIS — M8588 Other specified disorders of bone density and structure, other site: Secondary | ICD-10-CM | POA: Diagnosis not present

## 2021-03-07 DIAGNOSIS — Z79899 Other long term (current) drug therapy: Secondary | ICD-10-CM | POA: Diagnosis not present

## 2021-03-07 DIAGNOSIS — R197 Diarrhea, unspecified: Secondary | ICD-10-CM | POA: Diagnosis not present

## 2021-03-07 DIAGNOSIS — M87071 Idiopathic aseptic necrosis of right ankle: Secondary | ICD-10-CM | POA: Diagnosis not present

## 2021-03-07 DIAGNOSIS — I10 Essential (primary) hypertension: Secondary | ICD-10-CM | POA: Diagnosis not present

## 2021-03-07 DIAGNOSIS — M25571 Pain in right ankle and joints of right foot: Secondary | ICD-10-CM | POA: Diagnosis not present

## 2021-03-07 DIAGNOSIS — E559 Vitamin D deficiency, unspecified: Secondary | ICD-10-CM | POA: Diagnosis not present

## 2021-03-07 DIAGNOSIS — Z8543 Personal history of malignant neoplasm of ovary: Secondary | ICD-10-CM | POA: Diagnosis not present

## 2021-03-15 DIAGNOSIS — Z5111 Encounter for antineoplastic chemotherapy: Secondary | ICD-10-CM | POA: Diagnosis not present

## 2021-03-15 DIAGNOSIS — R1011 Right upper quadrant pain: Secondary | ICD-10-CM | POA: Diagnosis not present

## 2021-03-15 DIAGNOSIS — R10811 Right upper quadrant abdominal tenderness: Secondary | ICD-10-CM | POA: Diagnosis not present

## 2021-03-15 DIAGNOSIS — C563 Malignant neoplasm of bilateral ovaries: Secondary | ICD-10-CM | POA: Diagnosis not present

## 2021-03-19 DIAGNOSIS — R197 Diarrhea, unspecified: Secondary | ICD-10-CM | POA: Diagnosis not present

## 2021-03-19 DIAGNOSIS — K5900 Constipation, unspecified: Secondary | ICD-10-CM | POA: Diagnosis not present

## 2021-03-20 ENCOUNTER — Other Ambulatory Visit: Payer: Self-pay

## 2021-03-20 ENCOUNTER — Other Ambulatory Visit: Payer: Medicare (Managed Care)

## 2021-03-20 ENCOUNTER — Telehealth: Payer: Self-pay | Admitting: *Deleted

## 2021-03-20 DIAGNOSIS — R310 Gross hematuria: Secondary | ICD-10-CM | POA: Diagnosis not present

## 2021-03-20 LAB — URINALYSIS, ROUTINE W REFLEX MICROSCOPIC
Bacteria, UA: NONE SEEN /HPF
Bilirubin Urine: NEGATIVE
Glucose, UA: NEGATIVE
Hyaline Cast: NONE SEEN /LPF
Ketones, ur: NEGATIVE
Leukocytes,Ua: NEGATIVE
Nitrite: NEGATIVE
RBC / HPF: >=60 /HPF — AB (ref 0–2)
Specific Gravity, Urine: 1.015 (ref 1.001–1.035)
WBC, UA: NONE SEEN /HPF (ref 0–5)
pH: 6 (ref 5.0–8.0)

## 2021-03-20 LAB — MICROSCOPIC MESSAGE

## 2021-03-20 NOTE — Telephone Encounter (Signed)
Patient in office to leave urine sample. Noted with gross hematuria. Call placed to patient to inquire.   Reports that she has noted vaginal bleeding that began on 03/15/2021. States that she has scant blood after urination noted on toilet paper, and scant blood noted on panty liner.   Reports that she continues to have treatment for ovarian cancer, and most recent scan confirmed cancerous growths x2 to vaginal cuff. Reports that she has near constant lower abdominal cramping and pelvic fullness. Denies fever, chills, dysuria, urinary urgency or frequency.   Requested UA to R/O bladder/ kidney infection.  Results noted abnormal as Blood 3+, Protein 2+. Normal results notes as SG 1.015, pH 6.0, Glucose negative, Bilirubin negative, Ketone negative, Nitrite negative, Leukocyte negative.   Routed results to Dr. Mellody Drown, gynecological oncology with [facility].

## 2021-03-21 NOTE — Telephone Encounter (Signed)
Call placed to patient and patient made aware.   Faxed results to oncology.

## 2021-03-29 DIAGNOSIS — Z79899 Other long term (current) drug therapy: Secondary | ICD-10-CM | POA: Diagnosis not present

## 2021-03-29 DIAGNOSIS — Z5111 Encounter for antineoplastic chemotherapy: Secondary | ICD-10-CM | POA: Diagnosis not present

## 2021-03-29 DIAGNOSIS — C563 Malignant neoplasm of bilateral ovaries: Secondary | ICD-10-CM | POA: Diagnosis not present

## 2021-03-29 DIAGNOSIS — G629 Polyneuropathy, unspecified: Secondary | ICD-10-CM | POA: Diagnosis not present

## 2021-03-29 DIAGNOSIS — K5900 Constipation, unspecified: Secondary | ICD-10-CM | POA: Diagnosis not present

## 2021-03-29 DIAGNOSIS — R5383 Other fatigue: Secondary | ICD-10-CM | POA: Diagnosis not present

## 2021-04-04 ENCOUNTER — Other Ambulatory Visit: Payer: Self-pay | Admitting: Family Medicine

## 2021-04-10 DIAGNOSIS — Z5111 Encounter for antineoplastic chemotherapy: Secondary | ICD-10-CM | POA: Diagnosis not present

## 2021-04-10 DIAGNOSIS — C563 Malignant neoplasm of bilateral ovaries: Secondary | ICD-10-CM | POA: Diagnosis not present

## 2021-04-25 ENCOUNTER — Other Ambulatory Visit: Payer: Self-pay | Admitting: Family Medicine

## 2021-04-26 DIAGNOSIS — R112 Nausea with vomiting, unspecified: Secondary | ICD-10-CM | POA: Diagnosis not present

## 2021-04-26 DIAGNOSIS — C800 Disseminated malignant neoplasm, unspecified: Secondary | ICD-10-CM | POA: Diagnosis not present

## 2021-04-26 DIAGNOSIS — I749 Embolism and thrombosis of unspecified artery: Secondary | ICD-10-CM | POA: Diagnosis not present

## 2021-04-26 DIAGNOSIS — Z79899 Other long term (current) drug therapy: Secondary | ICD-10-CM | POA: Diagnosis not present

## 2021-04-26 DIAGNOSIS — Z90722 Acquired absence of ovaries, bilateral: Secondary | ICD-10-CM | POA: Diagnosis not present

## 2021-04-26 DIAGNOSIS — M879 Osteonecrosis, unspecified: Secondary | ICD-10-CM | POA: Diagnosis not present

## 2021-04-26 DIAGNOSIS — K5900 Constipation, unspecified: Secondary | ICD-10-CM | POA: Diagnosis not present

## 2021-04-26 DIAGNOSIS — R5383 Other fatigue: Secondary | ICD-10-CM | POA: Diagnosis not present

## 2021-04-26 DIAGNOSIS — Z5111 Encounter for antineoplastic chemotherapy: Secondary | ICD-10-CM | POA: Diagnosis not present

## 2021-04-26 DIAGNOSIS — R1011 Right upper quadrant pain: Secondary | ICD-10-CM | POA: Diagnosis not present

## 2021-04-26 DIAGNOSIS — K222 Esophageal obstruction: Secondary | ICD-10-CM | POA: Diagnosis not present

## 2021-04-26 DIAGNOSIS — Z7901 Long term (current) use of anticoagulants: Secondary | ICD-10-CM | POA: Diagnosis not present

## 2021-04-26 DIAGNOSIS — C563 Malignant neoplasm of bilateral ovaries: Secondary | ICD-10-CM | POA: Diagnosis not present

## 2021-04-26 DIAGNOSIS — M069 Rheumatoid arthritis, unspecified: Secondary | ICD-10-CM | POA: Diagnosis not present

## 2021-05-10 DIAGNOSIS — Z5111 Encounter for antineoplastic chemotherapy: Secondary | ICD-10-CM | POA: Diagnosis not present

## 2021-05-10 DIAGNOSIS — C563 Malignant neoplasm of bilateral ovaries: Secondary | ICD-10-CM | POA: Diagnosis not present

## 2021-05-24 DIAGNOSIS — G629 Polyneuropathy, unspecified: Secondary | ICD-10-CM | POA: Diagnosis not present

## 2021-05-24 DIAGNOSIS — Z5111 Encounter for antineoplastic chemotherapy: Secondary | ICD-10-CM | POA: Diagnosis not present

## 2021-05-24 DIAGNOSIS — Z90722 Acquired absence of ovaries, bilateral: Secondary | ICD-10-CM | POA: Diagnosis not present

## 2021-05-24 DIAGNOSIS — R971 Elevated cancer antigen 125 [CA 125]: Secondary | ICD-10-CM | POA: Diagnosis not present

## 2021-05-24 DIAGNOSIS — R5383 Other fatigue: Secondary | ICD-10-CM | POA: Diagnosis not present

## 2021-05-24 DIAGNOSIS — R634 Abnormal weight loss: Secondary | ICD-10-CM | POA: Diagnosis not present

## 2021-05-24 DIAGNOSIS — R1011 Right upper quadrant pain: Secondary | ICD-10-CM | POA: Diagnosis not present

## 2021-05-24 DIAGNOSIS — Z79899 Other long term (current) drug therapy: Secondary | ICD-10-CM | POA: Diagnosis not present

## 2021-05-24 DIAGNOSIS — Z6835 Body mass index (BMI) 35.0-35.9, adult: Secondary | ICD-10-CM | POA: Diagnosis not present

## 2021-05-24 DIAGNOSIS — E876 Hypokalemia: Secondary | ICD-10-CM | POA: Diagnosis not present

## 2021-05-24 DIAGNOSIS — N950 Postmenopausal bleeding: Secondary | ICD-10-CM | POA: Diagnosis not present

## 2021-05-24 DIAGNOSIS — C563 Malignant neoplasm of bilateral ovaries: Secondary | ICD-10-CM | POA: Diagnosis not present

## 2021-06-07 DIAGNOSIS — E876 Hypokalemia: Secondary | ICD-10-CM | POA: Diagnosis not present

## 2021-06-07 DIAGNOSIS — C563 Malignant neoplasm of bilateral ovaries: Secondary | ICD-10-CM | POA: Diagnosis not present

## 2021-06-07 DIAGNOSIS — Z5111 Encounter for antineoplastic chemotherapy: Secondary | ICD-10-CM | POA: Diagnosis not present

## 2021-06-07 DIAGNOSIS — Z79899 Other long term (current) drug therapy: Secondary | ICD-10-CM | POA: Diagnosis not present

## 2021-06-11 ENCOUNTER — Other Ambulatory Visit: Payer: Self-pay

## 2021-06-11 ENCOUNTER — Encounter: Payer: Self-pay | Admitting: Family Medicine

## 2021-06-11 ENCOUNTER — Ambulatory Visit (INDEPENDENT_AMBULATORY_CARE_PROVIDER_SITE_OTHER): Payer: Medicare (Managed Care) | Admitting: Family Medicine

## 2021-06-11 VITALS — BP 144/88 | HR 81 | Temp 97.7°F | Resp 18 | Ht 67.0 in | Wt 230.0 lb

## 2021-06-11 DIAGNOSIS — I749 Embolism and thrombosis of unspecified artery: Secondary | ICD-10-CM

## 2021-06-11 LAB — PT WITH INR/FINGERSTICK
INR, fingerstick: 3.2 ratio — ABNORMAL HIGH
PT, fingerstick: 38.4 s — ABNORMAL HIGH (ref 10.5–13.1)

## 2021-06-11 MED ORDER — LOSARTAN POTASSIUM 50 MG PO TABS: 50.0000 mg | ORAL_TABLET | Freq: Two times a day (BID) | ORAL | 2 refills | Status: DC

## 2021-06-11 MED ORDER — ALPRAZOLAM 0.5 MG PO TABS: 0.5000 mg | ORAL_TABLET | Freq: Three times a day (TID) | ORAL | 0 refills | Status: DC | PRN

## 2021-06-11 NOTE — Progress Notes (Signed)
Subjective:    Patient ID: Donna [name], female    DOB: Oct 10, 1960, 61 y.o.   MRN: [id number]  Patient is a very pleasant 61 year old Caucasian female here today to recheck her INR.  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 Initially diagnosed 2016 and completed 6 cycles platinum based chemotherapy 04/2015. 02/24/19 progressive disease on imaging and rising CA125 (01/2020). 02/17/20 initiated carboplatin and docetaxel completed cycle #8 on 07/27/20. Progression of disease 12/2020 based on CA-125 and CT. Initiated gemcitabine 01/04/21).   On 5 mg M/F, 10 mg all other days.  INR is 3.2.  He also states that her blood pressure has been high.  Her blood pressure is being checked every 2 weeks at [facility] when she goes for chemo.  Her systolic blood pressures in the 140's.  She also reports anxiety.  She states she is having hard time sleeping at night due to racing thoughts.  She is concerned because of her cancer struggle.  At times this preoccupies her mind and causes her significant anxiety Past Medical History:  Diagnosis Date   Anxiety    Arterial thrombosis (HCC)    right sfa, popliteal artery.  Require thromectomy at [facility] and lifelong anticoagulation.   Cancer (HCC)    ovarian, recurrent on chemo (carboplatin, doxorubicin) at [facility]   Colon polyp    Depression    Full dentures    GERD (gastroesophageal reflux disease)    HLD (hyperlipidemia) 12/03/2012   Hypertension    Hypothyroidism    IBS (irritable bowel syndrome)    Neck mass    Neuropathy    feet   Rheumatoid arthritis ([place])    at [facility] ring    Wears glasses    Past Surgical History:  Procedure Laterality Date   ABDOMINAL HYSTERECTOMY     ABLATION     COLONOSCOPY     CYST REMOVAL NECK  05/13/12   EAR CYST EXCISION  05/13/2012   Procedure: CYST REMOVAL;  Surgeon: Ralene Ok, MD;  Location: [place];  Service:  General;  Laterality: Left;  excision of neck cyst   THYROID SURGERY  10/2011 - approximate   ablation   TONSILLECTOMY     UPPER GI ENDOSCOPY     Current Outpatient Medications on File Prior to Visit  Medication Sig Dispense Refill   amLODipine (NORVASC) 10 MG tablet Take 1 tablet by mouth once daily 90 tablet 0   amoxicillin-clavulanate (AUGMENTIN) 875-125 MG tablet Take 1 tablet by mouth 2 (two) times daily. 20 tablet 0   atorvastatin (LIPITOR) 80 MG tablet Take 1 tablet (80 mg total) by mouth daily. 90 tablet 3   calcium carbonate (OS-CAL - DOSED IN MG OF ELEMENTAL CALCIUM) 1250 (500 Ca) MG tablet Take 1 tablet (500 mg of elemental calcium total) by mouth 3 (three) times daily with meals. 90 tablet 0   Cholecalciferol (VITAMIN D3) 50 MCG (2000 UT) capsule Take 2,000 Units by mouth daily.     diphenhydrAMINE (BENADRYL) 25 MG tablet Take 25 mg by mouth at bedtime as needed. For sleep     gabapentin (NEURONTIN) 300 MG capsule Take 1 capsule (300 mg total) by mouth 2 (two) times daily. 108 capsule 3   leflunomide (ARAVA) 20 MG tablet Take 20 mg by mouth daily.     levothyroxine (SYNTHROID) 175 MCG tablet TAKE 1 TABLET DAILY BEFORE BREAKFAST 90 tablet 3   losartan (  COZAAR) 50 MG tablet Take 1 tablet by mouth once daily 90 tablet 0   losartan (COZAAR) 50 MG tablet Take 1 tablet (50 mg total) by mouth daily. 90 tablet 0   methocarbamol (ROBAXIN) 500 MG tablet Take 1 tablet (500 mg total) by mouth every 6 (six) hours as needed. 40 tablet 1   omeprazole (PRILOSEC) 40 MG capsule Take 1 capsule (40 mg total) by mouth in the morning and at bedtime. 60 capsule 3   ondansetron (ZOFRAN) 8 MG tablet Take 8 mg by mouth daily as needed for nausea, vomiting or refractory nausea / vomiting.     potassium chloride SA (KLOR-CON) 20 MEQ tablet Take 2 tablets (40 mEq total) by mouth daily. 90 tablet 3   vitamin B-12 (CYANOCOBALAMIN) 1000 MCG tablet Take 1,000 mcg by mouth daily.     warfarin (COUMADIN) 5 MG tablet  TAKE ONE AND ONE-HALF TABLETS ONCE DAILY ON TUESDAY, THURSDAY, SATURDAY AND SUNDAY, THEN TAKE 2 TABLETS ONCE DAILY ON OTHER DAYS 135 tablet 3   WARFARIN SODIUM PO Take by mouth daily. 10mg  TWTSS, 5mg  MF     No current facility-administered medications on file prior to visit.   Allergies  Allergen Reactions   Paclitaxel Other (See Comments)    Flushing, Itching, Throat tightness   Social History   Socioeconomic History   Marital status: Single    Spouse name: Not on file   Number of children: Not on file   Years of education: Not on file   Highest education level: Not on file  Occupational History   Not on file  Tobacco Use   Smoking status: Former    Years: 32.00    Types: Cigarettes    Quit date: 06/19/2011    Years since quitting: 9.9   Smokeless tobacco: Never  Vaping Use   Vaping Use: Never used  Substance and Sexual Activity   Alcohol use: No   Drug use: No   Sexual activity: Yes  Other Topics Concern   Not on file  Social History Narrative   Not on file   Social Determinants of Health   Financial Resource Strain: Not on file  Food Insecurity: Not on file  Transportation Needs: Not on file  Physical Activity: Not on file  Stress: Not on file  Social Connections: Not on file  Intimate Partner Violence: Not on file      Review of Systems  All other systems reviewed and are negative.     Objective:   Physical Exam Vitals reviewed.  Constitutional:      Appearance: She is normal weight.  HENT:     Right Ear: Tympanic membrane and ear canal normal.     Left Ear: Tympanic membrane and ear canal normal.     Nose:     Right Turbinates: Swollen.     Left Turbinates: Swollen.     Right Sinus: No maxillary sinus tenderness or frontal sinus tenderness.     Left Sinus: Frontal sinus tenderness present. No maxillary sinus tenderness.     Mouth/Throat:     Mouth: Mucous membranes are moist.     Pharynx: No oropharyngeal exudate.  Cardiovascular:     Rate  and Rhythm: Normal rate and regular rhythm.     Heart sounds: Normal heart sounds.  Pulmonary:     Effort: Pulmonary effort is normal.     Breath sounds: Normal breath sounds.  Musculoskeletal:     Right lower leg: No edema.  Left lower leg: No edema.  Neurological:     Mental Status: She is alert.          Assessment & Plan:  Arterial thrombosis ([place]) - Plan: PT with INR/Fingerstick Change Coumadin to 5 mg on Monday, Wednesday, Friday.  Take 10 mg all other days.  Recheck in 6 weeks.  Increase losartan to 50 mg twice a daily.  Add Xanax 0.5 mg p.o. every 8 hours as needed insomnia or anxiety

## 2021-06-28 DIAGNOSIS — J90 Pleural effusion, not elsewhere classified: Secondary | ICD-10-CM | POA: Diagnosis not present

## 2021-06-28 DIAGNOSIS — Z79899 Other long term (current) drug therapy: Secondary | ICD-10-CM | POA: Diagnosis not present

## 2021-06-28 DIAGNOSIS — N1330 Unspecified hydronephrosis: Secondary | ICD-10-CM | POA: Diagnosis not present

## 2021-06-28 DIAGNOSIS — K229 Disease of esophagus, unspecified: Secondary | ICD-10-CM | POA: Diagnosis not present

## 2021-06-28 DIAGNOSIS — E876 Hypokalemia: Secondary | ICD-10-CM | POA: Diagnosis not present

## 2021-06-28 DIAGNOSIS — C563 Malignant neoplasm of bilateral ovaries: Secondary | ICD-10-CM | POA: Diagnosis not present

## 2021-07-01 ENCOUNTER — Encounter: Payer: Self-pay | Admitting: Family Medicine

## 2021-07-01 ENCOUNTER — Other Ambulatory Visit: Payer: Self-pay

## 2021-07-01 ENCOUNTER — Ambulatory Visit: Payer: Medicare (Managed Care) | Admitting: Family Medicine

## 2021-07-01 VITALS — BP 122/78 | HR 69 | Temp 98.0°F | Resp 20 | Ht 67.0 in | Wt 230.0 lb

## 2021-07-01 DIAGNOSIS — R509 Fever, unspecified: Secondary | ICD-10-CM

## 2021-07-01 MED ORDER — MOLNUPIRAVIR EUA 200MG CAPSULE: 4.0000 | ORAL_CAPSULE | Freq: Two times a day (BID) | ORAL | 0 refills | Status: AC

## 2021-07-01 NOTE — Progress Notes (Signed)
Subjective:    Patient ID: Donna [name], female    DOB: 1961/02/13, 61 y.o.   MRN: [id number]  Patient has a history of stage IV metastatic ovarian cancer.  It is recently spread to her liver.  She has been told to get her affairs in order per her report to me.  Beginning Thursday of last week, 4 days ago, the patient started developing flulike symptoms including fevers chills head congestion cough and some shortness of breath.  She denies any pleurisy or hemoptysis or purulent sputum.  However she found out that she was exposed to [place] on Sunday before she became sick. Past Medical History:  Diagnosis Date   Anxiety    Arterial thrombosis (HCC)    right sfa, popliteal artery.  Require thromectomy at [facility] and lifelong anticoagulation.   Cancer (HCC)    ovarian, recurrent on chemo (carboplatin, doxorubicin) at [facility]   Colon polyp    Depression    Full dentures    GERD (gastroesophageal reflux disease)    HLD (hyperlipidemia) 12/03/2012   Hypertension    Hypothyroidism    IBS (irritable bowel syndrome)    Neck mass    Neuropathy    feet   Rheumatoid arthritis ([place])    at [facility] ring    Wears glasses    Past Surgical History:  Procedure Laterality Date   ABDOMINAL HYSTERECTOMY     ABLATION     COLONOSCOPY     CYST REMOVAL NECK  05/13/12   EAR CYST EXCISION  05/13/2012   Procedure: CYST REMOVAL;  Surgeon: Ralene Ok, MD;  Location: [place];  Service: General;  Laterality: Left;  excision of neck cyst   THYROID SURGERY  10/2011 - approximate   ablation   TONSILLECTOMY     UPPER GI ENDOSCOPY     Current Outpatient Medications on File Prior to Visit  Medication Sig Dispense Refill   ALPRAZolam (XANAX) 0.5 MG tablet Take 1 tablet (0.5 mg total) by mouth 3 (three) times daily as needed. 30 tablet 0   amLODipine (NORVASC) 10 MG tablet Take 1 tablet by mouth once daily 90 tablet 0   atorvastatin (LIPITOR) 80 MG tablet Take 1 tablet (80 mg total) by mouth daily. 90  tablet 3   calcium carbonate (OS-CAL - DOSED IN MG OF ELEMENTAL CALCIUM) 1250 (500 Ca) MG tablet Take 1 tablet (500 mg of elemental calcium total) by mouth 3 (three) times daily with meals. 90 tablet 0   Cholecalciferol (VITAMIN D3) 50 MCG (2000 UT) capsule Take 2,000 Units by mouth daily.     diphenhydrAMINE (BENADRYL) 25 MG tablet Take 25 mg by mouth at bedtime as needed. For sleep     gabapentin (NEURONTIN) 300 MG capsule Take 1 capsule (300 mg total) by mouth 2 (two) times daily. 108 capsule 3   leflunomide (ARAVA) 20 MG tablet Take 20 mg by mouth daily.     levothyroxine (SYNTHROID) 175 MCG tablet TAKE 1 TABLET DAILY BEFORE BREAKFAST 90 tablet 3   losartan (COZAAR) 50 MG tablet Take 1 tablet (50 mg total) by mouth in the morning and at bedtime. 180 tablet 2   methocarbamol (ROBAXIN) 500 MG tablet Take 1 tablet (500 mg total) by mouth every 6 (six) hours as needed. 40 tablet 1   omeprazole (PRILOSEC) 40 MG capsule Take 1 capsule (40 mg total) by mouth in the morning and at bedtime. 60 capsule 3   ondansetron (ZOFRAN) 8 MG tablet Take 8  mg by mouth daily as needed for nausea, vomiting or refractory nausea / vomiting.     potassium chloride SA (KLOR-CON) 20 MEQ tablet Take 2 tablets (40 mEq total) by mouth daily. 90 tablet 3   prednisoLONE acetate (PRED FORTE) 1 % ophthalmic suspension Apply to eye.     vitamin B-12 (CYANOCOBALAMIN) 1000 MCG tablet Take 1,000 mcg by mouth daily.     warfarin (COUMADIN) 5 MG tablet TAKE ONE AND ONE-HALF TABLETS ONCE DAILY ON TUESDAY, THURSDAY, SATURDAY AND SUNDAY, THEN TAKE 2 TABLETS ONCE DAILY ON OTHER DAYS 135 tablet 3   No current facility-administered medications on file prior to visit.   Allergies  Allergen Reactions   Paclitaxel Other (See Comments)    Flushing, Itching, Throat tightness   Social History   Socioeconomic History   Marital status: Single    Spouse name: Not on file   Number of children: Not on file   Years of education: Not on file    Highest education level: Not on file  Occupational History   Not on file  Tobacco Use   Smoking status: Former    Years: 32.00    Types: Cigarettes    Quit date: 06/19/2011    Years since quitting: 10.0   Smokeless tobacco: Never  Vaping Use   Vaping Use: Never used  Substance and Sexual Activity   Alcohol use: No   Drug use: No   Sexual activity: Yes  Other Topics Concern   Not on file  Social History Narrative   Not on file   Social Determinants of Health   Financial Resource Strain: Not on file  Food Insecurity: Not on file  Transportation Needs: Not on file  Physical Activity: Not on file  Stress: Not on file  Social Connections: Not on file  Intimate Partner Violence: Not on file      Review of Systems  All other systems reviewed and are negative.     Objective:   Physical Exam Vitals reviewed.  Constitutional:      Appearance: She is normal weight.  HENT:     Right Ear: Tympanic membrane and ear canal normal.     Left Ear: Tympanic membrane and ear canal normal.     Nose:     Right Turbinates: Swollen.     Left Turbinates: Swollen.     Right Sinus: No maxillary sinus tenderness or frontal sinus tenderness.     Left Sinus: Frontal sinus tenderness present. No maxillary sinus tenderness.     Mouth/Throat:     Mouth: Mucous membranes are moist.     Pharynx: No oropharyngeal exudate.  Cardiovascular:     Rate and Rhythm: Normal rate and regular rhythm.     Heart sounds: Normal heart sounds.  Pulmonary:     Effort: Pulmonary effort is normal.     Breath sounds: Normal breath sounds.  Musculoskeletal:     Right lower leg: No edema.     Left lower leg: No edema.  Neurological:     Mental Status: She is alert.          Assessment & Plan:  Fever, unspecified fever cause - Plan: SARS-COV-2 RNA,(COVID-19) QUAL NAAT I do not have any rapid COVID test left as we are out.  The turnaround time of my current COVID test is 48 hours.  Given her  life-threatening comorbidities and when to start the patient empirically on molnupiravir.  Await the results of the COVID test.

## 2021-07-02 DIAGNOSIS — N1330 Unspecified hydronephrosis: Secondary | ICD-10-CM | POA: Diagnosis not present

## 2021-07-02 LAB — SARS-COV-2 RNA,(COVID-19) QUALITATIVE NAAT: SARS CoV2 RNA: DETECTED — AB

## 2021-07-08 DIAGNOSIS — H18593 Other hereditary corneal dystrophies, bilateral: Secondary | ICD-10-CM | POA: Diagnosis not present

## 2021-07-08 DIAGNOSIS — H25013 Cortical age-related cataract, bilateral: Secondary | ICD-10-CM | POA: Diagnosis not present

## 2021-07-12 ENCOUNTER — Telehealth: Payer: Self-pay

## 2021-07-12 DIAGNOSIS — Z5112 Encounter for antineoplastic immunotherapy: Secondary | ICD-10-CM | POA: Diagnosis not present

## 2021-07-12 DIAGNOSIS — C563 Malignant neoplasm of bilateral ovaries: Secondary | ICD-10-CM | POA: Diagnosis not present

## 2021-07-12 NOTE — Telephone Encounter (Signed)
Spoke with patient and advised. She voiced understanding. Nothing further needed at this time.

## 2021-07-12 NOTE — Telephone Encounter (Signed)
Patient called to report she is scheduled to have [place] on 07/18/21 and is wanting to know if she should hold her Coumadin for this procedure.   Please advise, thanks!

## 2021-07-16 DIAGNOSIS — I749 Embolism and thrombosis of unspecified artery: Secondary | ICD-10-CM | POA: Diagnosis not present

## 2021-07-16 DIAGNOSIS — E039 Hypothyroidism, unspecified: Secondary | ICD-10-CM | POA: Diagnosis not present

## 2021-07-16 DIAGNOSIS — I10 Essential (primary) hypertension: Secondary | ICD-10-CM | POA: Diagnosis not present

## 2021-07-16 DIAGNOSIS — C800 Disseminated malignant neoplasm, unspecified: Secondary | ICD-10-CM | POA: Diagnosis not present

## 2021-07-16 DIAGNOSIS — M059 Rheumatoid arthritis with rheumatoid factor, unspecified: Secondary | ICD-10-CM | POA: Diagnosis not present

## 2021-07-16 DIAGNOSIS — Z01818 Encounter for other preprocedural examination: Secondary | ICD-10-CM | POA: Diagnosis not present

## 2021-07-16 DIAGNOSIS — C563 Malignant neoplasm of bilateral ovaries: Secondary | ICD-10-CM | POA: Diagnosis not present

## 2021-07-16 DIAGNOSIS — G620 Drug-induced polyneuropathy: Secondary | ICD-10-CM | POA: Diagnosis not present

## 2021-07-17 ENCOUNTER — Telehealth: Payer: Self-pay

## 2021-07-17 NOTE — Telephone Encounter (Signed)
Pt called in this morning wanting to know if she should be put on lovenox prior to having surgery. Pt stated that she was taken off coumadin, but wanted to know if she should be taking lovenox instead.  Please advise. ? ?Cb: [phone] ?

## 2021-07-17 NOTE — Telephone Encounter (Signed)
Spoke with patient and advised. She voiced understanding. Nothing further needed at this time.  ? ?

## 2021-07-25 DIAGNOSIS — I10 Essential (primary) hypertension: Secondary | ICD-10-CM | POA: Diagnosis not present

## 2021-07-25 DIAGNOSIS — Z87891 Personal history of nicotine dependence: Secondary | ICD-10-CM | POA: Diagnosis not present

## 2021-07-25 DIAGNOSIS — Z7901 Long term (current) use of anticoagulants: Secondary | ICD-10-CM | POA: Diagnosis not present

## 2021-07-25 DIAGNOSIS — Z79899 Other long term (current) drug therapy: Secondary | ICD-10-CM | POA: Diagnosis not present

## 2021-07-25 DIAGNOSIS — Z6834 Body mass index (BMI) 34.0-34.9, adult: Secondary | ICD-10-CM | POA: Diagnosis not present

## 2021-07-25 DIAGNOSIS — M059 Rheumatoid arthritis with rheumatoid factor, unspecified: Secondary | ICD-10-CM | POA: Diagnosis not present

## 2021-07-25 DIAGNOSIS — M069 Rheumatoid arthritis, unspecified: Secondary | ICD-10-CM | POA: Diagnosis not present

## 2021-07-25 DIAGNOSIS — Z86718 Personal history of other venous thrombosis and embolism: Secondary | ICD-10-CM | POA: Diagnosis not present

## 2021-07-25 DIAGNOSIS — Z7952 Long term (current) use of systemic steroids: Secondary | ICD-10-CM | POA: Diagnosis not present

## 2021-07-25 DIAGNOSIS — E668 Other obesity: Secondary | ICD-10-CM | POA: Diagnosis not present

## 2021-07-25 DIAGNOSIS — N131 Hydronephrosis with ureteral stricture, not elsewhere classified: Secondary | ICD-10-CM | POA: Diagnosis not present

## 2021-07-25 DIAGNOSIS — Z8616 Personal history of COVID-19: Secondary | ICD-10-CM | POA: Diagnosis not present

## 2021-07-25 DIAGNOSIS — N1339 Other hydronephrosis: Secondary | ICD-10-CM | POA: Diagnosis not present

## 2021-07-25 DIAGNOSIS — Z7982 Long term (current) use of aspirin: Secondary | ICD-10-CM | POA: Diagnosis not present

## 2021-07-29 DIAGNOSIS — E785 Hyperlipidemia, unspecified: Secondary | ICD-10-CM | POA: Diagnosis not present

## 2021-07-29 DIAGNOSIS — M069 Rheumatoid arthritis, unspecified: Secondary | ICD-10-CM | POA: Diagnosis not present

## 2021-07-29 DIAGNOSIS — Z7982 Long term (current) use of aspirin: Secondary | ICD-10-CM | POA: Diagnosis not present

## 2021-07-29 DIAGNOSIS — N1330 Unspecified hydronephrosis: Secondary | ICD-10-CM | POA: Diagnosis not present

## 2021-07-29 DIAGNOSIS — Z20822 Contact with and (suspected) exposure to covid-19: Secondary | ICD-10-CM | POA: Diagnosis not present

## 2021-07-29 DIAGNOSIS — K439 Ventral hernia without obstruction or gangrene: Secondary | ICD-10-CM | POA: Diagnosis not present

## 2021-07-29 DIAGNOSIS — Z87891 Personal history of nicotine dependence: Secondary | ICD-10-CM | POA: Diagnosis not present

## 2021-07-29 DIAGNOSIS — Z7901 Long term (current) use of anticoagulants: Secondary | ICD-10-CM | POA: Diagnosis not present

## 2021-07-29 DIAGNOSIS — Z79899 Other long term (current) drug therapy: Secondary | ICD-10-CM | POA: Diagnosis not present

## 2021-07-29 DIAGNOSIS — Z90710 Acquired absence of both cervix and uterus: Secondary | ICD-10-CM | POA: Diagnosis not present

## 2021-07-29 DIAGNOSIS — Z86718 Personal history of other venous thrombosis and embolism: Secondary | ICD-10-CM | POA: Diagnosis not present

## 2021-07-29 DIAGNOSIS — C800 Disseminated malignant neoplasm, unspecified: Secondary | ICD-10-CM | POA: Diagnosis not present

## 2021-07-29 DIAGNOSIS — I10 Essential (primary) hypertension: Secondary | ICD-10-CM | POA: Diagnosis not present

## 2021-07-29 DIAGNOSIS — G629 Polyneuropathy, unspecified: Secondary | ICD-10-CM | POA: Diagnosis not present

## 2021-07-30 DIAGNOSIS — N1330 Unspecified hydronephrosis: Secondary | ICD-10-CM | POA: Diagnosis not present

## 2021-07-31 ENCOUNTER — Other Ambulatory Visit: Payer: Self-pay | Admitting: Family Medicine

## 2021-08-01 ENCOUNTER — Ambulatory Visit (INDEPENDENT_AMBULATORY_CARE_PROVIDER_SITE_OTHER): Payer: Medicare (Managed Care) | Admitting: Family Medicine

## 2021-08-01 ENCOUNTER — Encounter: Payer: Self-pay | Admitting: Family Medicine

## 2021-08-01 ENCOUNTER — Ambulatory Visit
Admission: RE | Admit: 2021-08-01 | Discharge: 2021-08-01 | Disposition: A | Discharge status: Home / Self Care | Payer: Medicare (Managed Care) | Source: Ambulatory Visit | Attending: Family Medicine | Admitting: Family Medicine

## 2021-08-01 ENCOUNTER — Other Ambulatory Visit: Payer: Self-pay | Admitting: Family Medicine

## 2021-08-01 ENCOUNTER — Other Ambulatory Visit: Payer: Self-pay

## 2021-08-01 VITALS — BP 142/82 | HR 105 | Temp 97.3°F | Resp 18 | Ht 67.0 in | Wt 221.0 lb

## 2021-08-01 DIAGNOSIS — R000 Tachycardia, unspecified: Secondary | ICD-10-CM

## 2021-08-01 DIAGNOSIS — C569 Malignant neoplasm of unspecified ovary: Secondary | ICD-10-CM

## 2021-08-01 DIAGNOSIS — R072 Precordial pain: Secondary | ICD-10-CM

## 2021-08-01 DIAGNOSIS — K449 Diaphragmatic hernia without obstruction or gangrene: Secondary | ICD-10-CM | POA: Diagnosis not present

## 2021-08-01 DIAGNOSIS — I2510 Atherosclerotic heart disease of native coronary artery without angina pectoris: Secondary | ICD-10-CM | POA: Diagnosis not present

## 2021-08-01 DIAGNOSIS — M47814 Spondylosis without myelopathy or radiculopathy, thoracic region: Secondary | ICD-10-CM | POA: Diagnosis not present

## 2021-08-01 DIAGNOSIS — J90 Pleural effusion, not elsewhere classified: Secondary | ICD-10-CM | POA: Diagnosis not present

## 2021-08-01 DIAGNOSIS — R0600 Dyspnea, unspecified: Secondary | ICD-10-CM | POA: Diagnosis not present

## 2021-08-01 DIAGNOSIS — R079 Chest pain, unspecified: Secondary | ICD-10-CM

## 2021-08-01 MED ORDER — IOPAMIDOL (ISOVUE-370) INJECTION 76%
75.0000 mL | Freq: Once | INTRAVENOUS | Status: AC | PRN
  Administered 2021-08-01: 75 mL via INTRAVENOUS

## 2021-08-01 NOTE — Progress Notes (Signed)
No ma'am ? ?Subjective:  ? ? Patient ID: Donna [name], female    DOB: [birth year], 61 y.o.   MRN: [id number] ? ?Patient has a history of stage IV metastatic ovarian cancer.  It is recently spread to her liver.  She also had COVID on 2/13.  Has been off coumadin for 1 week for surgery at [facility].  She reports a 1 month history of diffuse chest pressure and pain.  She puts both of her hands over her right and left chest described the location of the pain.  She reports dyspnea on exertion.  She states the pain starts in her back and radiates to her chest.  This is been going on now for approximately 3 to 4 weeks even before she stopped Coumadin.  However she is markedly winded today as a difficult time speaking full sentences despite her normal pulse oximetry. ?Past Medical History:  ?Diagnosis Date  ? Anxiety   ? Arterial thrombosis ([place])   ? right sfa, popliteal artery.  Require thromectomy at [facility] and lifelong anticoagulation.  ? Cancer [facility])   ? ovarian, recurrent on chemo (carboplatin, doxorubicin) at [facility]  ? Colon polyp   ? Depression   ? Full dentures   ? GERD (gastroesophageal reflux disease)   ? HLD (hyperlipidemia) 12/03/2012  ? Hypertension   ? Hypothyroidism   ? IBS (irritable bowel syndrome)   ? Neck mass   ? Neuropathy   ? feet  ? Rheumatoid arthritis ([place])   ? at [facility]  ? Schatzki's ring   ? Wears glasses   ? ?Past Surgical History:  ?Procedure Laterality Date  ? ABDOMINAL HYSTERECTOMY    ? ABLATION    ? COLONOSCOPY    ? CYST REMOVAL NECK  05/13/12  ? EAR CYST EXCISION  05/13/2012  ? Procedure: CYST REMOVAL;  Surgeon: Ralene Ok, MD;  Location: [place];  Service: General;  Laterality: Left;  excision of neck cyst  ? THYROID SURGERY  10/2011 - approximate  ? ablation  ? TONSILLECTOMY    ? UPPER GI ENDOSCOPY    ? ?Current Outpatient Medications on File Prior to Visit  ?Medication Sig Dispense Refill  ? ALPRAZolam (XANAX) 0.5 MG tablet Take 1 tablet (0.5 mg total) by mouth 3 (three) times daily as needed. 30  tablet 0  ? amLODipine (NORVASC) 10 MG tablet Take 1 tablet by mouth once daily 90 tablet 3  ? atorvastatin (LIPITOR) 80 MG tablet Take 1 tablet (80 mg total) by mouth daily. 90 tablet 3  ? calcium carbonate (OS-CAL - DOSED IN MG OF ELEMENTAL CALCIUM) 1250 (500 Ca) MG tablet Take 1 tablet (500 mg of elemental calcium total) by mouth 3 (three) times daily with meals. 90 tablet 0  ? Cholecalciferol (VITAMIN D3) 50 MCG (2000 UT) capsule Take 2,000 Units by mouth daily.    ? diphenhydrAMINE (BENADRYL) 25 MG tablet Take 25 mg by mouth at bedtime as needed. For sleep    ? gabapentin (NEURONTIN) 300 MG capsule Take 1 capsule (300 mg total) by mouth 2 (two) times daily. 108 capsule 3  ? leflunomide (ARAVA) 20 MG tablet Take 20 mg by mouth daily.    ? levothyroxine (SYNTHROID) 175 MCG tablet TAKE 1 TABLET DAILY BEFORE BREAKFAST 90 tablet 3  ? losartan (COZAAR) 50 MG tablet Take 1 tablet by mouth once daily 90 tablet 3  ? omeprazole (PRILOSEC) 40 MG capsule Take 1 capsule (40 mg total) by mouth in the morning and at bedtime. 60 capsule  3  ? ondansetron (ZOFRAN) 8 MG tablet Take 8 mg by mouth daily as needed for nausea, vomiting or refractory nausea / vomiting.    ? oxybutynin (DITROPAN) 5 MG tablet Take 5 mg by mouth 3 (three) times daily.    ? oxyCODONE (OXY IR/ROXICODONE) 5 MG immediate release tablet Take 5 mg by mouth 4 (four) times daily as needed.    ? potassium chloride SA (KLOR-CON) 20 MEQ tablet Take 2 tablets (40 mEq total) by mouth daily. 90 tablet 3  ? tamsulosin (FLOMAX) 0.4 MG CAPS capsule Take 0.4 mg by mouth daily.    ? vitamin B-12 (CYANOCOBALAMIN) 1000 MCG tablet Take 1,000 mcg by mouth daily.    ? warfarin (COUMADIN) 5 MG tablet TAKE ONE AND ONE-HALF TABLETS ONCE DAILY ON TUESDAY, THURSDAY, SATURDAY AND SUNDAY, THEN TAKE 2 TABLETS ONCE DAILY ON OTHER DAYS 135 tablet 3  ? methocarbamol (ROBAXIN) 500 MG tablet Take 1 tablet (500 mg total) by mouth every 6 (six) hours as needed. (Patient not taking: Reported  on 08/01/2021) 40 tablet 1  ? ?No current facility-administered medications on file prior to visit.  ? ?Allergies  ?Allergen Reactions  ? Paclitaxel Other (See Comments)  ?  Flushing, Itching, Throat tightness  ? ?Social History  ? ?Socioeconomic History  ? Marital status: Single  ?  Spouse name: Not on file  ? Number of children: Not on file  ? Years of education: Not on file  ? Highest education level: Not on file  ?Occupational History  ? Not on file  ?Tobacco Use  ? Smoking status: Former  ?  Years: 32.00  ?  Types: Cigarettes  ?  Quit date: 06/19/2011  ?  Years since quitting: 10.1  ? Smokeless tobacco: Never  ?Vaping Use  ? Vaping Use: Never used  ?Substance and Sexual Activity  ? Alcohol use: No  ? Drug use: No  ? Sexual activity: Yes  ?Other Topics Concern  ? Not on file  ?Social History Narrative  ? Not on file  ? ?Social Determinants of Health  ? ?Financial Resource Strain: Not on file  ?Food Insecurity: Not on file  ?Transportation Needs: Not on file  ?Physical Activity: Not on file  ?Stress: Not on file  ?Social Connections: Not on file  ?Intimate Partner Violence: Not on file  ? ? ? ? ?Review of Systems  ?All other systems reviewed and are negative. ? ?   ?Objective:  ? Physical Exam ?Vitals reviewed.  ?Constitutional:   ?   Appearance: She is well-developed and normal weight. She is not ill-appearing or toxic-appearing.  ?HENT:  ?   Right Ear: Tympanic membrane and ear canal normal.  ?   Left Ear: Tympanic membrane and ear canal normal.  ?   Nose:  ?   Right Turbinates: Swollen.  ?   Left Turbinates: Swollen.  ?   Right Sinus: No maxillary sinus tenderness or frontal sinus tenderness.  ?   Left Sinus: Frontal sinus tenderness present. No maxillary sinus tenderness.  ?   Mouth/Throat:  ?   Mouth: Mucous membranes are moist.  ?   Pharynx: No oropharyngeal exudate.  ?Cardiovascular:  ?   Rate and Rhythm: Regular rhythm. Tachycardia present.  ?   Heart sounds: Normal heart sounds.  ?Pulmonary:  ?   Effort:  Pulmonary effort is normal. Tachypnea present. No accessory muscle usage or respiratory distress.  ?   Breath sounds: No stridor. Examination of the right-lower field reveals decreased breath  sounds. Decreased breath sounds present. No wheezing, rhonchi or rales.  ?Musculoskeletal:  ?   Right lower leg: No edema.  ?   Left lower leg: No edema.  ?Neurological:  ?   Mental Status: She is alert.  ? ? ? ? ? ?   ?Assessment & Plan:  ?Chest pain, unspecified type - Plan: EKG 12-Lead, CT Angio Chest W/Cm &/Or Wo Cm ? ?Dyspnea, unspecified type - Plan: CT Angio Chest W/Cm &/Or Wo Cm ? ?Malignant neoplasm of ovary, unspecified laterality ([place]) - Plan: CT Angio Chest W/Cm &/Or Wo Cm ? ?Tachycardia, unspecified - Plan: CT Angio Chest W/Cm &/Or Wo Cm ? ?Precordial pain - Plan: CT Angio Chest W/Cm &/Or Wo Cm ?There is no evidence of heart failure or fluid overload on exam.  Her pulmonary exam is relatively normal aside from her increased work of breathing.  My biggest concern based on her risk factors and presentation is a pulmonary embolus.  I will schedule the patient for a stat CT angiogram of the chest.  EKG is performed today.  EKG shows sinus tachycardia but no evidence of ischemia or infarction.  Send the patient immediately for CT scan to rule out pulmonary embolism.  Patient will require lifelong Lovenox if CT scan shows pulmonary embolism given her malignancy. ?

## 2021-08-02 DIAGNOSIS — J90 Pleural effusion, not elsewhere classified: Secondary | ICD-10-CM | POA: Diagnosis not present

## 2021-08-02 DIAGNOSIS — Z7901 Long term (current) use of anticoagulants: Secondary | ICD-10-CM | POA: Diagnosis not present

## 2021-08-02 DIAGNOSIS — C787 Secondary malignant neoplasm of liver and intrahepatic bile duct: Secondary | ICD-10-CM | POA: Diagnosis not present

## 2021-08-02 DIAGNOSIS — Z79899 Other long term (current) drug therapy: Secondary | ICD-10-CM | POA: Diagnosis not present

## 2021-08-02 DIAGNOSIS — C563 Malignant neoplasm of bilateral ovaries: Secondary | ICD-10-CM | POA: Diagnosis not present

## 2021-08-02 DIAGNOSIS — I10 Essential (primary) hypertension: Secondary | ICD-10-CM | POA: Diagnosis not present

## 2021-08-02 DIAGNOSIS — C800 Disseminated malignant neoplasm, unspecified: Secondary | ICD-10-CM | POA: Diagnosis not present

## 2021-08-02 DIAGNOSIS — Z5111 Encounter for antineoplastic chemotherapy: Secondary | ICD-10-CM | POA: Diagnosis not present

## 2021-08-02 DIAGNOSIS — E039 Hypothyroidism, unspecified: Secondary | ICD-10-CM | POA: Diagnosis not present

## 2021-08-02 DIAGNOSIS — Z5112 Encounter for antineoplastic immunotherapy: Secondary | ICD-10-CM | POA: Diagnosis not present

## 2021-08-02 DIAGNOSIS — C786 Secondary malignant neoplasm of retroperitoneum and peritoneum: Secondary | ICD-10-CM | POA: Diagnosis not present

## 2021-08-02 DIAGNOSIS — Z86718 Personal history of other venous thrombosis and embolism: Secondary | ICD-10-CM | POA: Diagnosis not present

## 2021-08-09 DIAGNOSIS — Z7982 Long term (current) use of aspirin: Secondary | ICD-10-CM | POA: Diagnosis not present

## 2021-08-09 DIAGNOSIS — Z7901 Long term (current) use of anticoagulants: Secondary | ICD-10-CM | POA: Diagnosis not present

## 2021-08-09 DIAGNOSIS — C787 Secondary malignant neoplasm of liver and intrahepatic bile duct: Secondary | ICD-10-CM | POA: Diagnosis not present

## 2021-08-09 DIAGNOSIS — C786 Secondary malignant neoplasm of retroperitoneum and peritoneum: Secondary | ICD-10-CM | POA: Diagnosis not present

## 2021-08-09 DIAGNOSIS — Z79899 Other long term (current) drug therapy: Secondary | ICD-10-CM | POA: Diagnosis not present

## 2021-08-09 DIAGNOSIS — J90 Pleural effusion, not elsewhere classified: Secondary | ICD-10-CM | POA: Diagnosis not present

## 2021-08-09 DIAGNOSIS — I10 Essential (primary) hypertension: Secondary | ICD-10-CM | POA: Diagnosis not present

## 2021-08-09 DIAGNOSIS — Z8543 Personal history of malignant neoplasm of ovary: Secondary | ICD-10-CM | POA: Diagnosis not present

## 2021-08-09 DIAGNOSIS — Z7952 Long term (current) use of systemic steroids: Secondary | ICD-10-CM | POA: Diagnosis not present

## 2021-08-09 DIAGNOSIS — Z9221 Personal history of antineoplastic chemotherapy: Secondary | ICD-10-CM | POA: Diagnosis not present

## 2021-08-15 DIAGNOSIS — M059 Rheumatoid arthritis with rheumatoid factor, unspecified: Secondary | ICD-10-CM | POA: Diagnosis not present

## 2021-08-15 DIAGNOSIS — C563 Malignant neoplasm of bilateral ovaries: Secondary | ICD-10-CM | POA: Diagnosis not present

## 2021-08-15 DIAGNOSIS — Z79899 Other long term (current) drug therapy: Secondary | ICD-10-CM | POA: Diagnosis not present

## 2021-08-15 DIAGNOSIS — M87076 Idiopathic aseptic necrosis of unspecified foot: Secondary | ICD-10-CM | POA: Diagnosis not present

## 2021-08-15 DIAGNOSIS — M8589 Other specified disorders of bone density and structure, multiple sites: Secondary | ICD-10-CM | POA: Diagnosis not present

## 2021-08-20 DIAGNOSIS — H18593 Other hereditary corneal dystrophies, bilateral: Secondary | ICD-10-CM | POA: Diagnosis not present

## 2021-08-20 DIAGNOSIS — C563 Malignant neoplasm of bilateral ovaries: Secondary | ICD-10-CM | POA: Diagnosis not present

## 2021-08-20 DIAGNOSIS — Z79899 Other long term (current) drug therapy: Secondary | ICD-10-CM | POA: Diagnosis not present

## 2021-08-23 DIAGNOSIS — C563 Malignant neoplasm of bilateral ovaries: Secondary | ICD-10-CM | POA: Diagnosis not present

## 2021-08-23 DIAGNOSIS — J90 Pleural effusion, not elsewhere classified: Secondary | ICD-10-CM | POA: Diagnosis not present

## 2021-08-23 DIAGNOSIS — Z5111 Encounter for antineoplastic chemotherapy: Secondary | ICD-10-CM | POA: Diagnosis not present

## 2021-08-23 DIAGNOSIS — I10 Essential (primary) hypertension: Secondary | ICD-10-CM | POA: Diagnosis not present

## 2021-08-23 DIAGNOSIS — Z7901 Long term (current) use of anticoagulants: Secondary | ICD-10-CM | POA: Diagnosis not present

## 2021-08-23 DIAGNOSIS — C786 Secondary malignant neoplasm of retroperitoneum and peritoneum: Secondary | ICD-10-CM | POA: Diagnosis not present

## 2021-08-23 DIAGNOSIS — N1330 Unspecified hydronephrosis: Secondary | ICD-10-CM | POA: Diagnosis not present

## 2021-08-23 DIAGNOSIS — R5383 Other fatigue: Secondary | ICD-10-CM | POA: Diagnosis not present

## 2021-08-23 DIAGNOSIS — Z5112 Encounter for antineoplastic immunotherapy: Secondary | ICD-10-CM | POA: Diagnosis not present

## 2021-08-23 DIAGNOSIS — Z79899 Other long term (current) drug therapy: Secondary | ICD-10-CM | POA: Diagnosis not present

## 2021-08-23 DIAGNOSIS — M069 Rheumatoid arthritis, unspecified: Secondary | ICD-10-CM | POA: Diagnosis not present

## 2021-08-23 DIAGNOSIS — R1900 Intra-abdominal and pelvic swelling, mass and lump, unspecified site: Secondary | ICD-10-CM | POA: Diagnosis not present

## 2021-08-23 DIAGNOSIS — E039 Hypothyroidism, unspecified: Secondary | ICD-10-CM | POA: Diagnosis not present

## 2021-08-23 DIAGNOSIS — Z90722 Acquired absence of ovaries, bilateral: Secondary | ICD-10-CM | POA: Diagnosis not present

## 2021-09-01 ENCOUNTER — Ambulatory Visit (HOSPITAL_COMMUNITY)
Admission: EM | Admit: 2021-09-01 | Discharge: 2021-09-01 | Disposition: A | Discharge status: Home / Self Care | Payer: Medicare (Managed Care) | Attending: Internal Medicine | Admitting: Internal Medicine

## 2021-09-01 ENCOUNTER — Ambulatory Visit (INDEPENDENT_AMBULATORY_CARE_PROVIDER_SITE_OTHER): Payer: Medicare (Managed Care)

## 2021-09-01 ENCOUNTER — Encounter (HOSPITAL_COMMUNITY): Payer: Self-pay | Admitting: *Deleted

## 2021-09-01 DIAGNOSIS — J209 Acute bronchitis, unspecified: Secondary | ICD-10-CM

## 2021-09-01 DIAGNOSIS — Z1152 Encounter for screening for COVID-19: Secondary | ICD-10-CM | POA: Diagnosis not present

## 2021-09-01 DIAGNOSIS — R0602 Shortness of breath: Secondary | ICD-10-CM | POA: Diagnosis not present

## 2021-09-01 DIAGNOSIS — R059 Cough, unspecified: Secondary | ICD-10-CM | POA: Diagnosis not present

## 2021-09-01 DIAGNOSIS — R918 Other nonspecific abnormal finding of lung field: Secondary | ICD-10-CM | POA: Diagnosis not present

## 2021-09-01 HISTORY — DX: Other artificial openings of urinary tract status: Z93.6

## 2021-09-01 MED ORDER — METHYLPREDNISOLONE SODIUM SUCC 125 MG IJ SOLR
80.0000 mg | Freq: Once | INTRAMUSCULAR | Status: AC
  Administered 2021-09-01: 80 mg via INTRAMUSCULAR

## 2021-09-01 MED ORDER — ALBUTEROL SULFATE HFA 108 (90 BASE) MCG/ACT IN AERS: 2.0000 | INHALATION_SPRAY | Freq: Four times a day (QID) | RESPIRATORY_TRACT | 0 refills | Status: DC | PRN

## 2021-09-01 MED ORDER — AMOXICILLIN-POT CLAVULANATE 875-125 MG PO TABS: 1.0000 | ORAL_TABLET | Freq: Two times a day (BID) | ORAL | 0 refills | Status: AC

## 2021-09-01 NOTE — ED Triage Notes (Addendum)
C/O cough, congestion, chills, HA, elevated temps onset 6 days ago. Has been taking OTC meds without relief. ?Also reports a small leak in nephrostomy bag, noticed while in waiting area. Has not notified surgeon yet. States has spare bag at home. ?States had Covid in Feb 2023. Requesting Covid test. ?Reports hx pleural effusion - was going to have thoracentesis few wks ago, but effusion decreased enough on its own. ?

## 2021-09-01 NOTE — ED Provider Notes (Signed)
?[place] ? ? ? ?CSN: [id number] ?Arrival date & time: 09/01/21  1044 ? ? ?  ? ?History   ?Chief Complaint ?Chief Complaint  ?Patient presents with  ? Cough  ? Nasal Congestion  ? ? ?HPI ?Donna [name] is a 61 y.o. female with history of ovarian cancer currently undergoing chemotherapy presents urgent care today with complaints of cough and congestion.  Patient reports symptoms ongoing for 6 days with upper airway congestion and mildly productive cough.  Patient reports history of pleural effusion that became worse with COVID in 2/23.  Patient states effusion resolved on its own and did not require thoracentesis as expected.  Patient states she has felt feverish but no elevated temperature, mild shortness of breath, denies any chest pain, abdominal pain, lower extremity swelling.  Chronic diarrhea is unchanged.  Patient denies any medications this morning. ? ? ? ?Past Medical History:  ?Diagnosis Date  ? Anxiety   ? 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  Require thromectomy at [facility] and lifelong anticoagulation.  ? Cancer [facility])   ? ovarian, recurrent on chemo (carboplatin, doxorubicin) at [facility]  ? Colon polyp   ? Depression   ? Full dentures   ? GERD (gastroesophageal reflux disease)   ? HLD (hyperlipidemia) 12/03/2012  ? Hypertension   ? Hypothyroidism   ? IBS (irritable bowel syndrome)   ? Neck mass   ? Nephrostomy status ([place])   ? Neuropathy   ? feet  ? Rheumatoid arthritis ([place])   ? at [facility]  ? Schatzki's ring   ? Wears glasses   ? ? ?Patient Active Problem List  ? Diagnosis Date Noted  ? Costochondritis 07/16/2021  ? Hypokalemia   ? Hypomagnesemia   ? Hypocalcemia   ? Thrombocytopenia ([place])   ? Hypertension   ? Malignant neoplasm of ovary ([place])   ? Symptomatic anemia 08/15/2020  ? Rheumatoid arthritis ([place])   ? Arterial thrombosis ([place])   ? Nodule of vagina 02/03/2017  ? Pulmonary nodule 02/03/2017  ? Primary malignant neoplasm of ovary ([place]) 12/07/2014  ? Hypothyroidism 12/03/2012   ? HLD (hyperlipidemia) 12/03/2012  ? OVERWEIGHT 06/22/2007  ? NICOTINE ADDICTION 06/22/2007  ? DEPRESSION 06/22/2007  ? Essential hypertension 06/22/2007  ? GERD 06/22/2007  ? OSTEOARTHRITIS 06/22/2007  ? ? ?Past Surgical History:  ?Procedure Laterality Date  ? ABDOMINAL HYSTERECTOMY    ? ABLATION    ? COLONOSCOPY    ? CYST REMOVAL NECK  05/13/12  ? EAR CYST EXCISION  05/13/2012  ? Procedure: CYST REMOVAL;  Surgeon: Ralene Ok, MD;  Location: [place];  Service: General;  Laterality: Left;  excision of neck cyst  ? THYROID SURGERY  10/2011 - approximate  ? ablation  ? TONSILLECTOMY    ? UPPER GI ENDOSCOPY    ? ? ?OB History   ?No obstetric history on file. ?  ? ? ? ?Home Medications   ? ?Prior to Admission medications   ?Medication Sig Start Date End Date Taking? Authorizing Provider  ?albuterol (VENTOLIN HFA) 108 (90 Base) MCG/ACT inhaler Inhale 2 puffs into the lungs every 6 (six) hours as needed for wheezing or shortness of breath. 09/01/21  Yes Rudolpho Sevin, NP  ?amLODipine (NORVASC) 10 MG tablet Take 1 tablet by mouth once daily 07/31/21  Yes Susy Frizzle, MD  ?amoxicillin-clavulanate (AUGMENTIN) 875-125 MG tablet Take 1 tablet by mouth 2 (two) times daily for 10 days. 09/01/21 09/11/21 Yes Rudolpho Sevin, NP  ?atorvastatin (  LIPITOR) 80 MG tablet Take 1 tablet (80 mg total) by mouth daily. 12/19/20  Yes Susy Frizzle, MD  ?calcium carbonate (OS-CAL - DOSED IN MG OF ELEMENTAL CALCIUM) 1250 (500 Ca) MG tablet Take 1 tablet (500 mg of elemental calcium total) by mouth 3 (three) times daily with meals. 08/17/20  Yes Eugenie Filler, MD  ?Cholecalciferol (VITAMIN D3) 50 MCG (2000 UT) capsule Take 2,000 Units by mouth daily. 10/17/18  Yes [provider]  ?gabapentin (NEURONTIN) 300 MG capsule Take 1 capsule (300 mg total) by mouth 2 (two) times daily. 08/24/20  Yes Susy Frizzle, MD  ?leflunomide (ARAVA) 20 MG tablet Take 20 mg by mouth daily. 01/20/19  Yes [provider]  ?levothyroxine  (SYNTHROID) 175 MCG tablet TAKE 1 TABLET DAILY BEFORE BREAKFAST 01/28/21  Yes Susy Frizzle, MD  ?losartan (COZAAR) 50 MG tablet Take 1 tablet by mouth once daily 07/31/21  Yes Susy Frizzle, MD  ?MAGNESIUM PO Take 800 mg by mouth.   Yes [provider]  ?omeprazole (PRILOSEC) 40 MG capsule Take 1 capsule (40 mg total) by mouth in the morning and at bedtime. 01/16/20  Yes Susy Frizzle, MD  ?potassium chloride SA (KLOR-CON) 20 MEQ tablet Take 2 tablets (40 mEq total) by mouth daily. 08/24/20  Yes Susy Frizzle, MD  ?PREDNISOLONE ACETATE OP Apply to eye.   Yes [provider]  ?vitamin B-12 (CYANOCOBALAMIN) 1000 MCG tablet Take 1,000 mcg by mouth daily.   Yes [provider]  ?warfarin (COUMADIN) 5 MG tablet TAKE ONE AND ONE-HALF TABLETS ONCE DAILY ON TUESDAY, THURSDAY, SATURDAY AND SUNDAY, THEN TAKE 2 TABLETS ONCE DAILY ON OTHER DAYS 04/04/21  Yes Susy Frizzle, MD  ?ALPRAZolam Duanne Moron) 0.5 MG tablet Take 1 tablet (0.5 mg total) by mouth 3 (three) times daily as needed. 06/11/21   Susy Frizzle, MD  ?diphenhydrAMINE (BENADRYL) 25 MG tablet Take 25 mg by mouth at bedtime as needed. For sleep    [provider]  ?methocarbamol (ROBAXIN) 500 MG tablet Take 1 tablet (500 mg total) by mouth every 6 (six) hours as needed. ?Patient not taking: Reported on 08/01/2021 10/11/20   Mcarthur Rossetti, MD  ?ondansetron [facility]) 8 MG tablet Take 8 mg by mouth daily as needed for nausea, vomiting or refractory nausea / vomiting. 08/10/18   [provider]  ?oxybutynin (DITROPAN) 5 MG tablet Take 5 mg by mouth 3 (three) times daily. 07/25/21   [provider]  ?oxyCODONE (OXY IR/ROXICODONE) 5 MG immediate release tablet Take 5 mg by mouth 4 (four) times daily as needed. 07/30/21   [provider]  ?Polyvinyl Alcohol-Povidone ([place] OP) Apply to eye.    [provider]  ?tamsulosin (FLOMAX) 0.4 MG CAPS capsule Take 0.4 mg by mouth daily. 07/25/21    [provider]  ? ? ?Family History ?Family History  ?Problem Relation Age of Onset  ? Heart attack Mother   ? Hypertension Mother   ? Diabetes Mother   ? Heart disease Mother   ? CVA Father   ? Hypertension Father   ? Diabetes Father   ? Stroke Father   ? Heart attack Sister   ?     CABG  ? Diabetes Sister   ? Stroke Sister   ? Hypertension Sister   ? Hyperlipidemia Sister   ? Kidney disease Sister   ? Stroke Sister   ? Heart attack Sister   ? Cancer Brother   ?  lung cancer - stage 4  ? Cancer Brother   ?     skin   ? Heart disease Maternal Grandfather   ? ? ?Social History ?Social History  ? ?Tobacco Use  ? Smoking status: Former  ?  Years: 32.00  ?  Types: Cigarettes  ?  Quit date: 06/19/2011  ?  Years since quitting: 10.2  ? Smokeless tobacco: Never  ?Vaping Use  ? Vaping Use: Never used  ?Substance Use Topics  ? Alcohol use: No  ? Drug use: No  ? ? ? ?Allergies   ?Paclitaxel ? ? ?Review of Systems ?As stated in HPI otherwise negative ? ? ?Physical Exam ?Triage Vital Signs ?ED Triage Vitals  ?Enc Vitals Group  ?   BP 09/01/21 1236 (!) 151/94  ?   Pulse Rate 09/01/21 1236 92  ?   Resp 09/01/21 1236 18  ?   Temp 09/01/21 1236 98.2 ?F (36.8 ?C)  ?   Temp Source 09/01/21 1236 Oral  ?   SpO2 09/01/21 1236 93 %  ?   Weight --   ?   Height --   ?   Head Circumference --   ?   Peak Flow --   ?   Pain Score 09/01/21 1239 2  ?   Pain Loc --   ?   Pain Edu? --   ?   Excl. in [place]? --   ? ?No data found. ? ?Updated Vital Signs ?BP (!) 151/94   Pulse 92   Temp 98.2 ?F (36.8 ?C) (Oral)   Resp 18   SpO2 93%  ? ?Visual Acuity ?Right Eye Distance:   ?Left Eye Distance:   ?Bilateral Distance:   ? ?Right Eye Near:   ?Left Eye Near:    ?Bilateral Near:    ? ?Physical Exam ?Constitutional:   ?   General: She is not in acute distress. ?   Appearance: Normal appearance. She is not ill-appearing or toxic-appearing.  ?HENT:  ?   Right Ear: There is impacted cerumen.  ?   Left Ear: Tympanic membrane and ear canal normal.   ?   Nose: Congestion present. No rhinorrhea.  ?   Mouth/Throat:  ?   Mouth: Mucous membranes are moist.  ?   Pharynx: No oropharyngeal exudate or posterior oropharyngeal erythema.  ?Eyes:  ?   General:

## 2021-09-01 NOTE — Discharge Instructions (Addendum)
Take antibiotic as prescribed.  You have been given a shot of steroids in the office today because of your wheezing.  I have prescribed an albuterol inhaler for you to use every 4-6 hours as needed for shortness of breath/cough.  Please follow-up with Dr. Dennard Schaumann in 1 week or follow-up sooner should you have any worsening symptoms.  Your COVID test results should be available in 2 to 3 days via MyChart.  My suspicion for COVID is low given that you just had it 2 months ago. ?

## 2021-09-02 ENCOUNTER — Emergency Department (HOSPITAL_COMMUNITY): Payer: Medicare (Managed Care)

## 2021-09-02 ENCOUNTER — Encounter (HOSPITAL_COMMUNITY): Payer: Self-pay

## 2021-09-02 ENCOUNTER — Other Ambulatory Visit: Payer: Self-pay | Admitting: Family Medicine

## 2021-09-02 ENCOUNTER — Emergency Department (HOSPITAL_COMMUNITY)
Admission: EM | Admit: 2021-09-02 | Discharge: 2021-09-02 | Disposition: A | Discharge status: Home / Self Care | Payer: Medicare (Managed Care) | Attending: Emergency Medicine | Admitting: Emergency Medicine

## 2021-09-02 ENCOUNTER — Other Ambulatory Visit: Payer: Self-pay

## 2021-09-02 DIAGNOSIS — Z7901 Long term (current) use of anticoagulants: Secondary | ICD-10-CM | POA: Insufficient documentation

## 2021-09-02 DIAGNOSIS — C7960 Secondary malignant neoplasm of unspecified ovary: Secondary | ICD-10-CM | POA: Diagnosis not present

## 2021-09-02 DIAGNOSIS — T83022A Displacement of nephrostomy catheter, initial encounter: Secondary | ICD-10-CM | POA: Insufficient documentation

## 2021-09-02 DIAGNOSIS — D72829 Elevated white blood cell count, unspecified: Secondary | ICD-10-CM | POA: Diagnosis not present

## 2021-09-02 DIAGNOSIS — I10 Essential (primary) hypertension: Secondary | ICD-10-CM | POA: Diagnosis not present

## 2021-09-02 DIAGNOSIS — T83092A Other mechanical complication of nephrostomy catheter, initial encounter: Secondary | ICD-10-CM | POA: Diagnosis not present

## 2021-09-02 DIAGNOSIS — N135 Crossing vessel and stricture of ureter without hydronephrosis: Secondary | ICD-10-CM | POA: Diagnosis not present

## 2021-09-02 HISTORY — PX: IR NEPHROSTOMY EXCHANGE LEFT: IMG6069

## 2021-09-02 LAB — COMPREHENSIVE METABOLIC PANEL
ALT: 23 U/L (ref 0–44)
AST: 28 U/L (ref 15–41)
Albumin: 3.6 g/dL (ref 3.5–5.0)
Alkaline Phosphatase: 109 U/L (ref 38–126)
Anion gap: 9 (ref 5–15)
BUN: 14 mg/dL (ref 8–23)
CO2: 24 mmol/L (ref 22–32)
Calcium: 9.4 mg/dL (ref 8.9–10.3)
Chloride: 104 mmol/L (ref 98–111)
Creatinine, Ser: 0.6 mg/dL (ref 0.44–1.00)
GFR, Estimated: >60 mL/min (ref >60)
Glucose, Bld: 112 mg/dL — ABNORMAL HIGH (ref 70–99)
Potassium: 3.1 mmol/L — ABNORMAL LOW (ref 3.5–5.1)
Sodium: 137 mmol/L (ref 135–145)
Total Bilirubin: 0.2 mg/dL — ABNORMAL LOW (ref 0.3–1.2)
Total Protein: 7.5 g/dL (ref 6.5–8.1)

## 2021-09-02 LAB — CBC WITH DIFFERENTIAL/PLATELET
Abs Immature Granulocytes: 0.06 10*3/uL (ref 0.00–0.07)
Basophils Absolute: 0 10*3/uL (ref 0.0–0.1)
Basophils Relative: 0 %
Eosinophils Absolute: 0 10*3/uL (ref 0.0–0.5)
Eosinophils Relative: 0 %
HCT: 36.5 % (ref 36.0–46.0)
Hemoglobin: 11.9 g/dL — ABNORMAL LOW (ref 12.0–15.0)
Immature Granulocytes: 0 %
Lymphocytes Relative: 10 %
Lymphs Abs: 1.6 10*3/uL (ref 0.7–4.0)
MCH: 27.4 pg (ref 26.0–34.0)
MCHC: 32.6 g/dL (ref 30.0–36.0)
MCV: 83.9 fL (ref 80.0–100.0)
Monocytes Absolute: 1 10*3/uL (ref 0.1–1.0)
Monocytes Relative: 6 %
Neutro Abs: 13.6 10*3/uL — ABNORMAL HIGH (ref 1.7–7.7)
Neutrophils Relative %: 84 %
Platelets: 326 10*3/uL (ref 150–400)
RBC: 4.35 MIL/uL (ref 3.87–5.11)
RDW: 14 % (ref 11.5–15.5)
WBC: 16.2 10*3/uL — ABNORMAL HIGH (ref 4.0–10.5)
nRBC: 0 % (ref 0.0–0.2)

## 2021-09-02 LAB — PROTIME-INR
INR: 3.1 — ABNORMAL HIGH (ref 0.8–1.2)
Prothrombin Time: 31.5 seconds — ABNORMAL HIGH (ref 11.4–15.2)

## 2021-09-02 LAB — SARS CORONAVIRUS 2 (TAT 6-24 HRS): SARS Coronavirus 2: NEGATIVE

## 2021-09-02 MED ORDER — IOHEXOL 300 MG/ML  SOLN
50.0000 mL | Freq: Once | INTRAMUSCULAR | Status: AC | PRN
  Administered 2021-09-02: 15 mL

## 2021-09-02 MED ORDER — LIDOCAINE HCL 1 % IJ SOLN
INTRAMUSCULAR | Status: AC
  Filled 2021-09-02: qty 20

## 2021-09-02 NOTE — ED Notes (Signed)
Pt still does not have enough urine in nephrostomy for specimen. Ham sandwich, apple juice and graham crackers provided. Pt also took home supply of tylenol for headache, which was approved by Dr Francia Greaves. Pt states no further needs at this time. ?

## 2021-09-02 NOTE — ED Triage Notes (Signed)
Pt BIB EMS. Patient states she was sitting in a chair when she stood up the nephrostomy tube came out. Patient states there was minimal bleeding. Hx of recurrent ovarian cancer. Patient is on warfarin. Patient also states a new cough and SHOB which she was seen for at urgent care. Patient was given an albuterol inhaler to help with the [facility] along with Augmentin for the cough. ?

## 2021-09-02 NOTE — ED Provider Notes (Signed)
?[facility] DEPT ?Provider Note ? ? ?CSN: [id number] ?Arrival date & time: 09/02/21  1819 ? ?  ? ?History ? ?No chief complaint on file. ? ? ?Donna [name] is a 61 y.o. female. ? ?61 year old female with prior medical history as detailed below presents for evaluation.  Patient was followed by [facility].  Patient with left nephrostomy placed approximately 5 weeks ago.  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 ? ?Patient stood up from a chair this afternoon around 5 and inadvertently pulled out the entire left nephrostomy tube. ? ?Patient also reports that she is currently on Augmentin for treatment of URI.  Patient had COVID testing performed yesterday was negative. ? ?She denies any significant pain or discomfort at this time. ? ?The history is provided by the patient and medical records.  ?Illness ?Location:  Removal of left nephrostomy tube 2 hours prior. ?Severity:  Mild ?Onset quality:  Sudden ?Duration:  2 hours ?Timing:  Constant ?Progression:  Unchanged ?Chronicity:  New ? ?  ? ?Home Medications ?Prior to Admission medications   ?Medication Sig Start Date End Date Taking? Authorizing Provider  ?albuterol (VENTOLIN HFA) 108 (90 Base) MCG/ACT inhaler Inhale 2 puffs into the lungs every 6 (six) hours as needed for wheezing or shortness of breath. 09/01/21   Rudolpho Sevin, NP  ?ALPRAZolam (XANAX) 0.5 MG tablet Take 1 tablet (0.5 mg total) by mouth 3 (three) times daily as needed. 06/11/21   Susy Frizzle, MD  ?amLODipine (NORVASC) 10 MG tablet Take 1 tablet by mouth once daily 07/31/21   Susy Frizzle, MD  ?amoxicillin-clavulanate (AUGMENTIN) 875-125 MG tablet Take 1 tablet by mouth 2 (two) times daily for 10 days. 09/01/21 09/11/21  Rudolpho Sevin, NP  ?atorvastatin (LIPITOR) 80 MG tablet Take 1 tablet (80 mg total) by mouth daily. 12/19/20   Susy Frizzle, MD  ?calcium carbonate (OS-CAL - DOSED IN MG OF ELEMENTAL CALCIUM) 1250 (500 Ca) MG  tablet Take 1 tablet (500 mg of elemental calcium total) by mouth 3 (three) times daily with meals. 08/17/20   Eugenie Filler, MD  ?Cholecalciferol (VITAMIN D3) 50 MCG (2000 UT) capsule Take 2,000 Units by mouth daily. 10/17/18   [provider]  ?diphenhydrAMINE (BENADRYL) 25 MG tablet Take 25 mg by mouth at bedtime as needed. For sleep    [provider]  ?gabapentin (NEURONTIN) 300 MG capsule TAKE 1 CAPSULE TWICE A DAY 09/02/21   Susy Frizzle, MD  ?leflunomide (ARAVA) 20 MG tablet Take 20 mg by mouth daily. 01/20/19   [provider]  ?levothyroxine (SYNTHROID) 175 MCG tablet TAKE 1 TABLET DAILY BEFORE BREAKFAST 01/28/21   Susy Frizzle, MD  ?losartan (COZAAR) 50 MG tablet Take 1 tablet by mouth once daily 07/31/21   Susy Frizzle, MD  ?MAGNESIUM PO Take 800 mg by mouth.    [provider]  ?methocarbamol (ROBAXIN) 500 MG tablet Take 1 tablet (500 mg total) by mouth every 6 (six) hours as needed. 10/11/20   Mcarthur Rossetti, MD  ?omeprazole (PRILOSEC) 40 MG capsule Take 1 capsule (40 mg total) by mouth in the morning and at bedtime. 01/16/20   Susy Frizzle, MD  ?ondansetron (ZOFRAN) 8 MG tablet Take 8 mg by mouth daily as needed for nausea, vomiting or refractory nausea / vomiting. 08/10/18   [provider]  ?oxybutynin (DITROPAN) 5 MG tablet Take 5 mg by mouth 3 (three) times daily.  07/25/21   [provider]  ?oxyCODONE (OXY IR/ROXICODONE) 5 MG immediate release tablet Take 5 mg by mouth 4 (four) times daily as needed. 07/30/21   [provider]  ?Polyvinyl Alcohol-Povidone (Tubac OP) Apply to eye.    [provider]  ?potassium chloride SA (KLOR-CON) 20 MEQ tablet Take 2 tablets (40 mEq total) by mouth daily. 08/24/20   Susy Frizzle, MD  ?PREDNISOLONE ACETATE OP Apply to eye.    [provider]  ?tamsulosin (FLOMAX) 0.4 MG CAPS capsule Take 0.4 mg by mouth daily. 07/25/21   [provider]  ?vitamin  B-12 (CYANOCOBALAMIN) 1000 MCG tablet Take 1,000 mcg by mouth daily.    [provider]  ?warfarin (COUMADIN) 5 MG tablet TAKE ONE AND ONE-HALF TABLETS ONCE DAILY ON TUESDAY, THURSDAY, SATURDAY AND SUNDAY, THEN TAKE 2 TABLETS ONCE DAILY ON OTHER DAYS 04/04/21   Susy Frizzle, MD  ?   ? ?Allergies    ?Paclitaxel   ? ?Review of Systems   ?Review of Systems  ?All other systems reviewed and are negative. ? ?Physical Exam ?Updated Vital Signs ?BP (!) 156/86   Pulse 82   Temp 98.7 ?F (37.1 ?C) (Oral)   Resp 17   Ht '5\' 7"'$  (1.702 m)   Wt 100 kg   SpO2 93%   BMI 34.53 kg/m?  ?Physical Exam ?Vitals and nursing note reviewed.  ?Constitutional:   ?   General: She is not in acute distress. ?   Appearance: Normal appearance. She is well-developed.  ?HENT:  ?   Head: Normocephalic and atraumatic.  ?Eyes:  ?   Conjunctiva/sclera: Conjunctivae normal.  ?   Pupils: Pupils are equal, round, and reactive to light.  ?Cardiovascular:  ?   Rate and Rhythm: Normal rate and regular rhythm.  ?   Heart sounds: Normal heart sounds.  ?Pulmonary:  ?   Effort: Pulmonary effort is normal. No respiratory distress.  ?   Breath sounds: Normal breath sounds.  ?Abdominal:  ?   General: There is no distension.  ?   Palpations: Abdomen is soft.  ?   Tenderness: There is no abdominal tenderness.  ?Genitourinary: ?   Comments: Left nephrostomy tube site on left flank is clean and dry.  There is some minimal urine draining from the wound.  ? ?Proximately tubing itself is with the patient.  Her tubing appears to be intact.  There is no evidence of disconnection or possible breakage of nephrostomy tube. ?Musculoskeletal:     ?   General: No deformity. Normal range of motion.  ?   Cervical back: Normal range of motion and neck supple.  ?Skin: ?   General: Skin is warm and dry.  ?Neurological:  ?   General: No focal deficit present.  ?   Mental Status: She is alert and oriented to person, place, and time.  ? ? ?ED Results / Procedures /  Treatments   ?Labs ?(all labs ordered are listed, but only abnormal results are displayed) ?Labs Reviewed  ?PROTIME-INR - Abnormal; Notable for the following components:  ?    Result Value  ? Prothrombin Time 31.5 (*)   ? INR 3.1 (*)   ? All other components within normal limits  ?CBC WITH DIFFERENTIAL/PLATELET - Abnormal; Notable for the following components:  ? WBC 16.2 (*)   ? Hemoglobin 11.9 (*)   ? Neutro Abs 13.6 (*)   ? All other components within normal limits  ?COMPREHENSIVE METABOLIC PANEL - Abnormal; Notable  for the following components:  ? Potassium 3.1 (*)   ? Glucose, Bld 112 (*)   ? Total Bilirubin 0.2 (*)   ? All other components within normal limits  ?URINE CULTURE  ?URINALYSIS, ROUTINE W REFLEX MICROSCOPIC  ? ? ?EKG ?None ? ?Radiology ?DG Chest 2 View ? ?Result Date: 09/01/2021 ?CLINICAL DATA:  61 year old female with history of cough and shortness of breath. EXAM: CHEST - 2 VIEW COMPARISON:  Chest x-ray 08/15/2020. FINDINGS: Lung volumes are normal. Mild diffuse peribronchial cuffing. No consolidative airspace disease. No pleural effusions. No pneumothorax. No pulmonary nodule or mass noted. Pulmonary vasculature and the cardiomediastinal silhouette are within normal limits. IMPRESSION: 1. Mild diffuse peribronchial cuffing concerning for an acute bronchitis. Electronically Signed   By: Vinnie Langton M.D.   On: 09/01/2021 13:41   ? ?Procedures ?Procedures  ? ? ?Medications Ordered in ED ?Medications  ?lidocaine (XYLOCAINE) 1 % (with pres) injection (has no administration in time range)  ?iohexol (OMNIPAQUE) 300 MG/ML solution 50 mL (15 mLs Per Tube Contrast Given 09/02/21 2025)  ? ? ?ED Course/ Medical Decision Making/ A&P ?  ?                        ?Medical Decision Making ?Amount and/or Complexity of Data Reviewed ?Labs: ordered. ? ? ? ?Medical Screen Complete ? ?This patient presented to the ED with complaint of left nephrostomy tube dislodgment. ? ?This complaint involves an extensive number  of treatment options. The initial differential diagnosis includes, but is not limited to, left nephrostomy tube dislodgment ? ?This presentation is: Acute, Self-Limited, Previously Undiagnosed, Uncertain Prognosis,

## 2021-09-02 NOTE — Discharge Instructions (Signed)
Return for any problem. ? ?Continue to take Augmentin as previously prescribed. ? ?Follow-up with your care providers in the [facility] system as instructed tomorrow. ?

## 2021-09-02 NOTE — Procedures (Signed)
Interventional Radiology Procedure Note ? ?Procedure: Left nephrostomy replacement ? ?Findings: Please refer to procedural dictation for full description. Left nephrostomy catheter and wire track recanalization.  New 10.2 Fr nephrostomy placed with pigtail coiled in left renal pelvis.  Mild hematuria upon access to collecting system.  Catheter placed to bag drainage. ? ?Complications: None immediate ? ?Estimated Blood Loss:  <5 mL ? ?Recommendations: ?Keep to bag drainage.  Ensure follow up with [facility] for routine exchanges. ?Expect hematuria to clear within 24-48 hrs. ?From IR standpoint, no barriers to discharge.  Leukocytosis likely secondary to recent bronchitis.  No indication for additional antibiotics as patient reports currently taking Augmentin.   ?Follow up with [facility] as needed.  Patient instructed to call with worsening pain, hematuria, fevers, chills. ? ? ?Ruthann Cancer, MD ?Pager: [phone] ? ? ? ?

## 2021-09-02 NOTE — ED Notes (Signed)
Patient to IR at this time.   

## 2021-09-03 LAB — URINALYSIS, ROUTINE W REFLEX MICROSCOPIC
Bilirubin Urine: NEGATIVE
Glucose, UA: 50 mg/dL — AB
Ketones, ur: NEGATIVE mg/dL
Nitrite: NEGATIVE
Protein, ur: 100 mg/dL — AB
RBC / HPF: >50 RBC/hpf — ABNORMAL HIGH (ref 0–5)
Specific Gravity, Urine: 1.042 — ABNORMAL HIGH (ref 1.005–1.030)
pH: 6 (ref 5.0–8.0)

## 2021-09-05 LAB — URINE CULTURE: Culture: 10000 — AB

## 2021-09-06 ENCOUNTER — Telehealth: Payer: Self-pay

## 2021-09-06 NOTE — Telephone Encounter (Signed)
Post ED Visit - Positive Culture Follow-up ? ?Culture report reviewed by antimicrobial stewardship pharmacist: ?[place] Team ?'[]'$  Elenor Quinones, Pharm.D. ?'[]'$  Heide Guile, Pharm.D., BCPS AQ-ID ?'[]'$  Parks Neptune, Pharm.D., BCPS ?'[]'$  Alycia Rossetti, Pharm.D., BCPS ?'[]'$  Chance, Pharm.D., BCPS, AAHIVP ?'[]'$  Legrand Como, Pharm.D., BCPS, AAHIVP ?'[]'$  Salome Arnt, PharmD, BCPS ?'[]'$  Johnnette Gourd, PharmD, BCPS ?'[]'$  Hughes Better, PharmD, BCPS ?'[]'$  Leeroy Cha, PharmD ?'[]'$  Laqueta Linden, PharmD, BCPS ?'[]'$  Albertina Parr, PharmD ? ?[facility] Team ?'[x]'$  Jimmy Footman, PharmD ?'[]'$  Lindell Spar, PharmD ?'[]'$  Royetta Asal, PharmD ?'[]'$  Graylin Shiver, Rph ?'[]'$  Rema Fendt) Donna Mac, PharmD ?'[]'$  Arlyn Dunning, PharmD ?'[]'$  Netta Cedars, PharmD ?'[]'$  Dia Sitter, PharmD ?'[]'$  Leone Haven, PharmD ?'[]'$  Gretta Arab, PharmD ?'[]'$  Theodis Shove, PharmD ?'[]'$  Peggyann Juba, PharmD ?'[]'$  Reuel Boom, PharmD ? ? ?Positive urine culture ?Treated with Augmentin, organism sensitive to the same and no further patient follow-up is required at this time. ? ?Donna [name] ?09/06/2021, 8:00 AM ?  ?

## 2021-09-09 ENCOUNTER — Other Ambulatory Visit: Payer: Self-pay

## 2021-09-09 DIAGNOSIS — R0989 Other specified symptoms and signs involving the circulatory and respiratory systems: Secondary | ICD-10-CM

## 2021-09-10 DIAGNOSIS — Z436 Encounter for attention to other artificial openings of urinary tract: Secondary | ICD-10-CM | POA: Diagnosis not present

## 2021-09-10 DIAGNOSIS — Z466 Encounter for fitting and adjustment of urinary device: Secondary | ICD-10-CM | POA: Diagnosis not present

## 2021-09-10 DIAGNOSIS — N1330 Unspecified hydronephrosis: Secondary | ICD-10-CM | POA: Diagnosis not present

## 2021-09-11 DIAGNOSIS — C800 Disseminated malignant neoplasm, unspecified: Secondary | ICD-10-CM | POA: Diagnosis not present

## 2021-09-11 DIAGNOSIS — C563 Malignant neoplasm of bilateral ovaries: Secondary | ICD-10-CM | POA: Diagnosis not present

## 2021-09-11 DIAGNOSIS — N1330 Unspecified hydronephrosis: Secondary | ICD-10-CM | POA: Diagnosis not present

## 2021-09-11 DIAGNOSIS — J90 Pleural effusion, not elsewhere classified: Secondary | ICD-10-CM | POA: Diagnosis not present

## 2021-09-11 DIAGNOSIS — Z5111 Encounter for antineoplastic chemotherapy: Secondary | ICD-10-CM | POA: Diagnosis not present

## 2021-09-13 DIAGNOSIS — C563 Malignant neoplasm of bilateral ovaries: Secondary | ICD-10-CM | POA: Diagnosis not present

## 2021-09-13 DIAGNOSIS — Z79899 Other long term (current) drug therapy: Secondary | ICD-10-CM | POA: Diagnosis not present

## 2021-09-13 DIAGNOSIS — Z5112 Encounter for antineoplastic immunotherapy: Secondary | ICD-10-CM | POA: Diagnosis not present

## 2021-09-20 NOTE — Progress Notes (Signed)
?HISTORY AND PHYSICAL  ? ? ? ?CC:  follow up. ?Requesting Provider:  Susy Frizzle, MD ? ?HPI: This is a 61 y.o. female who is here today for follow up for PAD.  Pt has  a complex past medical history.  In 2016 she was diagnosis with ovarian cancer.  She is undergone surgery and chemotherapy for this and is doing well overall.  She was diagnosed with peripheral neuropathy before her cancer diagnosis.  She also suffers from rheumatoid arthritis.  In April 2020 she presented to her oncologist office and reported right foot pain.  No pulse was identified in the right foot.  She was referred to the ER and underwent an urgent thromboembolectomy and aortic stent by Dr. Lovenia Shuck at [facility] [place].  She had a good result and restoration of a palpable pulse in her foot.  She was seen in close interval follow-up and discharged from the vascular practice in late 2020. ? ?Pt was last seen 09/11/2020 and at that time, she was having worsening pain in the right foot.  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  She saw Dr. Meridee Score who noted no pulse in her right foot, and recommended she be seen by our group.  She was not having any claudication and did not have rest pain or non healing wounds.  Activity was limited by pain in her right foot. She was scheduled for CT scan and seen back 09/11/2021 by Dr. Stanford Breed.  CTA revealed she had peroneal only runoff to the foot but was providing adequate flow with normal ABI and toe pressures.  She did not have options for augmenting flow to the foot. She was continued on best medical therapy for PAD with coumadin and statin.  She was scheduled for one year follow up. ? ?The pt returns today for follow up.  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  She also has a nephrostomy tube and they attempted to put stent to left kidney but was unsuccessful.  She has more pain in the right  great toe and she feels her foot is cold in various places on the right foot at different times.  She denies rest pain where she has to put her foot on the ground to improve pain.  She does not have any non healing wounds or claudication. She does try to do walking but knows her limitations.  She has started wearing socks with her tennis shoes to help protect her feet.  She has an appt with [facility] ortho for 2nd opinion for AVN.  ? ?The pt is on a statin for cholesterol management.    ?The pt is on an aspirin.    Other AC:  coumadin ?The pt is on CCB, ARB for hypertension.  ?The pt does not have diabetes. ?Tobacco hx:  former ? ?Pt does not have family hx of AAA. ? ?Past Medical History:  ?Diagnosis Date  ? Anxiety   ? 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  Require thromectomy at [facility] and lifelong anticoagulation.  ? Cancer [facility])   ? ovarian, recurrent on chemo (carboplatin, doxorubicin) at [facility]  ? Colon polyp   ? Depression   ? Full dentures   ? GERD (gastroesophageal reflux disease)   ? HLD (hyperlipidemia) 12/03/2012  ? Hypertension   ? Hypothyroidism   ? IBS (irritable bowel syndrome)   ? Neck mass   ?  Nephrostomy status ([place])   ? Neuropathy   ? feet  ? Rheumatoid arthritis ([place])   ? at [facility]  ? Schatzki's ring   ? Wears glasses   ? ? ?Past Surgical History:  ?Procedure Laterality Date  ? ABDOMINAL HYSTERECTOMY    ? ABLATION    ? COLONOSCOPY    ? CYST REMOVAL NECK  05/13/12  ? EAR CYST EXCISION  05/13/2012  ? Procedure: CYST REMOVAL;  Surgeon: Ralene Ok, MD;  Location: [facility];  Service: General;  Laterality: Left;  excision of neck cyst  ? IR NEPHROSTOMY EXCHANGE LEFT  09/02/2021  ? THYROID SURGERY  10/2011 - approximate  ? ablation  ? TONSILLECTOMY    ? UPPER GI ENDOSCOPY    ? ? ?Allergies  ?Allergen Reactions  ? Paclitaxel Other (See Comments)  ?  Flushing, Itching, Throat tightness  ? ? ?Current Outpatient Medications  ?Medication Sig Dispense Refill  ? albuterol (VENTOLIN HFA) 108 (90  Base) MCG/ACT inhaler Inhale 2 puffs into the lungs every 6 (six) hours as needed for wheezing or shortness of breath. (Patient not taking: Reported on 09/02/2021) 8 g 0  ? ALPRAZolam (XANAX) 0.5 MG tablet Take 1 tablet (0.5 mg total) by mouth 3 (three) times daily as needed. (Patient not taking: Reported on 09/02/2021) 30 tablet 0  ? amLODipine (NORVASC) 10 MG tablet Take 1 tablet by mouth once daily (Patient taking differently: Take 10 mg by mouth daily. Atypical angina/hypertension) 90 tablet 3  ? aspirin EC 81 MG tablet Take 81 mg by mouth daily. Swallow whole.    ? atorvastatin (LIPITOR) 80 MG tablet Take 1 tablet (80 mg total) by mouth daily. 90 tablet 3  ? calcium carbonate (OS-CAL - DOSED IN MG OF ELEMENTAL CALCIUM) 1250 (500 Ca) MG tablet Take 1 tablet (500 mg of elemental calcium total) by mouth 3 (three) times daily with meals. (Patient taking differently: Take 1 tablet by mouth daily with breakfast.) 90 tablet 0  ? Cholecalciferol (VITAMIN D3) 50 MCG (2000 UT) capsule Take 2,000 Units by mouth daily.    ? gabapentin (NEURONTIN) 300 MG capsule TAKE 1 CAPSULE TWICE A DAY (Patient taking differently: Take 300 mg by mouth 2 (two) times daily.) 180 capsule 3  ? leflunomide (ARAVA) 20 MG tablet Take 20 mg by mouth daily.    ? levothyroxine (SYNTHROID) 175 MCG tablet TAKE 1 TABLET DAILY BEFORE BREAKFAST (Patient taking differently: Take 175 mcg by mouth daily before breakfast.) 90 tablet 3  ? losartan (COZAAR) 50 MG tablet Take 1 tablet by mouth once daily 90 tablet 3  ? MAGNESIUM PO Take 800 mg by mouth daily.    ? omeprazole (PRILOSEC) 40 MG capsule Take 1 capsule (40 mg total) by mouth in the morning and at bedtime. 60 capsule 3  ? ondansetron (ZOFRAN) 8 MG tablet Take 8 mg by mouth daily as needed for nausea, vomiting or refractory nausea / vomiting.    ? oxybutynin (DITROPAN) 5 MG tablet Take 5 mg by mouth 3 (three) times daily.    ? oxyCODONE (OXY IR/ROXICODONE) 5 MG immediate release tablet Take 5 mg by  mouth 4 (four) times daily as needed for moderate pain.    ? Polyvinyl Alcohol-Povidone (REFRESH OP) Place 1 drop into both eyes See admin instructions. Take 1 drop both eyes on the day before, day of and 7 days after chemotherapy    ? potassium chloride SA (KLOR-CON) 20 MEQ tablet Take 2 tablets (40 mEq total) by mouth daily.  90 tablet 3  ? PREDNISOLONE ACETATE OP Place 1 drop into both eyes See admin instructions. Take 1 drop both eyes on the day before, day of and 7 days after chemotherapy    ? tamsulosin (FLOMAX) 0.4 MG CAPS capsule Take 0.4 mg by mouth daily.    ? vitamin B-12 (CYANOCOBALAMIN) 1000 MCG tablet Take 1,000 mcg by mouth daily.    ? warfarin (COUMADIN) 5 MG tablet TAKE ONE AND ONE-HALF TABLETS ONCE DAILY ON TUESDAY, THURSDAY, SATURDAY AND SUNDAY, THEN TAKE 2 TABLETS ONCE DAILY ON OTHER DAYS (Patient taking differently: Take 5-10 mg by mouth See admin instructions. Take '5mg'$  by mouth Monday, Wednesday, Friday, then take '10mg'$  all other days) 135 tablet 3  ? ?No current facility-administered medications for this visit.  ? ? ?Family History  ?Problem Relation Age of Onset  ? Heart attack Mother   ? Hypertension Mother   ? Diabetes Mother   ? Heart disease Mother   ? CVA Father   ? Hypertension Father   ? Diabetes Father   ? Stroke Father   ? Heart attack Sister   ?     CABG  ? Diabetes Sister   ? Stroke Sister   ? Hypertension Sister   ? Hyperlipidemia Sister   ? Kidney disease Sister   ? Stroke Sister   ? Heart attack Sister   ? Cancer Brother   ?     lung cancer - stage 4  ? Cancer Brother   ?     skin   ? Heart disease Maternal Grandfather   ? ? ?Social History  ? ?Socioeconomic History  ? Marital status: Single  ?  Spouse name: Not on file  ? Number of children: Not on file  ? Years of education: Not on file  ? Highest education level: Not on file  ?Occupational History  ? Not on file  ?Tobacco Use  ? Smoking status: Former  ?  Years: 32.00  ?  Types: Cigarettes  ?  Quit date: 06/19/2011  ?  Years  since quitting: 10.2  ? Smokeless tobacco: Never  ?Vaping Use  ? Vaping Use: Never used  ?Substance and Sexual Activity  ? Alcohol use: No  ? Drug use: No  ? Sexual activity: Not on file  ?Other Topics Concern

## 2021-09-24 ENCOUNTER — Ambulatory Visit (HOSPITAL_COMMUNITY)
Admission: RE | Admit: 2021-09-24 | Discharge: 2021-09-24 | Disposition: A | Discharge status: Home / Self Care | Payer: Medicare (Managed Care) | Source: Ambulatory Visit | Attending: Physician Assistant | Admitting: Physician Assistant

## 2021-09-24 ENCOUNTER — Ambulatory Visit (INDEPENDENT_AMBULATORY_CARE_PROVIDER_SITE_OTHER): Payer: Medicare (Managed Care) | Admitting: Physician Assistant

## 2021-09-24 VITALS — BP 124/79 | HR 71 | Temp 97.6°F | Resp 18 | Ht 67.5 in | Wt 212.2 lb

## 2021-09-24 DIAGNOSIS — R0989 Other specified symptoms and signs involving the circulatory and respiratory systems: Secondary | ICD-10-CM | POA: Insufficient documentation

## 2021-09-24 DIAGNOSIS — I739 Peripheral vascular disease, unspecified: Secondary | ICD-10-CM | POA: Diagnosis not present

## 2021-10-01 DIAGNOSIS — C563 Malignant neoplasm of bilateral ovaries: Secondary | ICD-10-CM | POA: Diagnosis not present

## 2021-10-01 DIAGNOSIS — H18593 Other hereditary corneal dystrophies, bilateral: Secondary | ICD-10-CM | POA: Diagnosis not present

## 2021-10-01 DIAGNOSIS — Z79899 Other long term (current) drug therapy: Secondary | ICD-10-CM | POA: Diagnosis not present

## 2021-10-04 DIAGNOSIS — Z90722 Acquired absence of ovaries, bilateral: Secondary | ICD-10-CM | POA: Diagnosis not present

## 2021-10-04 DIAGNOSIS — Z5111 Encounter for antineoplastic chemotherapy: Secondary | ICD-10-CM | POA: Diagnosis not present

## 2021-10-04 DIAGNOSIS — C787 Secondary malignant neoplasm of liver and intrahepatic bile duct: Secondary | ICD-10-CM | POA: Diagnosis not present

## 2021-10-04 DIAGNOSIS — I10 Essential (primary) hypertension: Secondary | ICD-10-CM | POA: Diagnosis not present

## 2021-10-04 DIAGNOSIS — E039 Hypothyroidism, unspecified: Secondary | ICD-10-CM | POA: Diagnosis not present

## 2021-10-04 DIAGNOSIS — Z79899 Other long term (current) drug therapy: Secondary | ICD-10-CM | POA: Diagnosis not present

## 2021-10-04 DIAGNOSIS — C563 Malignant neoplasm of bilateral ovaries: Secondary | ICD-10-CM | POA: Diagnosis not present

## 2021-10-04 DIAGNOSIS — R5383 Other fatigue: Secondary | ICD-10-CM | POA: Diagnosis not present

## 2021-10-04 DIAGNOSIS — J90 Pleural effusion, not elsewhere classified: Secondary | ICD-10-CM | POA: Diagnosis not present

## 2021-10-04 DIAGNOSIS — R971 Elevated cancer antigen 125 [CA 125]: Secondary | ICD-10-CM | POA: Diagnosis not present

## 2021-10-04 DIAGNOSIS — K449 Diaphragmatic hernia without obstruction or gangrene: Secondary | ICD-10-CM | POA: Diagnosis not present

## 2021-10-04 DIAGNOSIS — M059 Rheumatoid arthritis with rheumatoid factor, unspecified: Secondary | ICD-10-CM | POA: Diagnosis not present

## 2021-10-04 DIAGNOSIS — N1330 Unspecified hydronephrosis: Secondary | ICD-10-CM | POA: Diagnosis not present

## 2021-10-04 DIAGNOSIS — Z5112 Encounter for antineoplastic immunotherapy: Secondary | ICD-10-CM | POA: Diagnosis not present

## 2021-10-08 ENCOUNTER — Other Ambulatory Visit: Payer: Self-pay | Admitting: Family Medicine

## 2021-10-09 NOTE — Telephone Encounter (Signed)
Requested Prescriptions  Pending Prescriptions Disp Refills  . potassium chloride SA (KLOR-CON M) 20 MEQ tablet [Pharmacy Med Name: Potassium Chloride Crys ER 20 MEQ Oral Tablet Extended Release] 180 tablet 0    Sig: Take 2 tablets by mouth once daily     Endocrinology:  Minerals - Potassium Supplementation Failed - 10/08/2021  2:27 PM      Failed - K in normal range and within 360 days    Potassium  Date Value Ref Range Status  09/02/2021 3.1 (L) 3.5 - 5.1 mmol/L Final  06/17/2011 3.1 (L) 3.5 - 5.1 mmol/L Final         Passed - Cr in normal range and within 360 days    Creat  Date Value Ref Range Status  09/24/2020 0.59 0.50 - 0.99 mg/dL Final    Comment:    For patients >89 years of age, the reference limit for Creatinine is approximately 13% higher for people identified as African-American. .    Creatinine, Ser  Date Value Ref Range Status  09/02/2021 0.60 0.44 - 1.00 mg/dL Final         Passed - Valid encounter within last 12 months    Recent Outpatient Visits          2 months ago Chest pain, unspecified type   [place], Warren T, MD   3 months ago Fever, unspecified fever cause   [place] Dennard Schaumann, Cammie Mcgee, MD   4 months ago Arterial thrombosis [facility])   [place] Susy Frizzle, MD   7 months ago Arterial thrombosis [facility])   [place] Susy Frizzle, MD   9 months ago Arterial thrombosis [facility])   [facility] [place], Cammie Mcgee, MD

## 2021-10-16 ENCOUNTER — Telehealth: Payer: Self-pay | Admitting: Family Medicine

## 2021-10-16 NOTE — Telephone Encounter (Signed)
Left message for patient to call back and schedule Medicare Annual Wellness Visit (AWV) in office.   If not able to come in office, please offer to do virtually or by telephone.  Left office number and my jabber [phone].  Last AWV:10/17/2021  Please schedule at anytime with Nurse Health Advisor.

## 2021-10-19 ENCOUNTER — Emergency Department (HOSPITAL_COMMUNITY): Payer: Medicare (Managed Care)

## 2021-10-19 ENCOUNTER — Emergency Department (HOSPITAL_COMMUNITY)
Admission: EM | Admit: 2021-10-19 | Discharge: 2021-10-19 | Disposition: A | Discharge status: Home / Self Care | Payer: Medicare (Managed Care) | Attending: Emergency Medicine | Admitting: Emergency Medicine

## 2021-10-19 ENCOUNTER — Encounter (HOSPITAL_COMMUNITY): Payer: Self-pay

## 2021-10-19 DIAGNOSIS — Z7982 Long term (current) use of aspirin: Secondary | ICD-10-CM | POA: Insufficient documentation

## 2021-10-19 DIAGNOSIS — R319 Hematuria, unspecified: Secondary | ICD-10-CM | POA: Insufficient documentation

## 2021-10-19 DIAGNOSIS — R109 Unspecified abdominal pain: Secondary | ICD-10-CM | POA: Insufficient documentation

## 2021-10-19 DIAGNOSIS — N390 Urinary tract infection, site not specified: Secondary | ICD-10-CM | POA: Diagnosis not present

## 2021-10-19 DIAGNOSIS — Z7902 Long term (current) use of antithrombotics/antiplatelets: Secondary | ICD-10-CM | POA: Diagnosis not present

## 2021-10-19 DIAGNOSIS — R791 Abnormal coagulation profile: Secondary | ICD-10-CM | POA: Insufficient documentation

## 2021-10-19 DIAGNOSIS — Z20822 Contact with and (suspected) exposure to covid-19: Secondary | ICD-10-CM | POA: Diagnosis not present

## 2021-10-19 DIAGNOSIS — R059 Cough, unspecified: Secondary | ICD-10-CM | POA: Diagnosis not present

## 2021-10-19 DIAGNOSIS — R0602 Shortness of breath: Secondary | ICD-10-CM | POA: Diagnosis not present

## 2021-10-19 DIAGNOSIS — K7460 Unspecified cirrhosis of liver: Secondary | ICD-10-CM | POA: Diagnosis not present

## 2021-10-19 DIAGNOSIS — Z79899 Other long term (current) drug therapy: Secondary | ICD-10-CM | POA: Insufficient documentation

## 2021-10-19 DIAGNOSIS — E876 Hypokalemia: Secondary | ICD-10-CM | POA: Diagnosis not present

## 2021-10-19 DIAGNOSIS — K439 Ventral hernia without obstruction or gangrene: Secondary | ICD-10-CM | POA: Diagnosis not present

## 2021-10-19 DIAGNOSIS — J159 Unspecified bacterial pneumonia: Secondary | ICD-10-CM | POA: Diagnosis not present

## 2021-10-19 LAB — CBC WITH DIFFERENTIAL/PLATELET
Abs Immature Granulocytes: 0.1 10*3/uL — ABNORMAL HIGH (ref 0.00–0.07)
Basophils Absolute: 0.1 10*3/uL (ref 0.0–0.1)
Basophils Relative: 1 %
Eosinophils Absolute: 0 10*3/uL (ref 0.0–0.5)
Eosinophils Relative: 0 %
HCT: 37.4 % (ref 36.0–46.0)
Hemoglobin: 11.9 g/dL — ABNORMAL LOW (ref 12.0–15.0)
Immature Granulocytes: 1 %
Lymphocytes Relative: 8 %
Lymphs Abs: 1.1 10*3/uL (ref 0.7–4.0)
MCH: 27.5 pg (ref 26.0–34.0)
MCHC: 31.8 g/dL (ref 30.0–36.0)
MCV: 86.4 fL (ref 80.0–100.0)
Monocytes Absolute: 1.3 10*3/uL — ABNORMAL HIGH (ref 0.1–1.0)
Monocytes Relative: 9 %
Neutro Abs: 11.7 10*3/uL — ABNORMAL HIGH (ref 1.7–7.7)
Neutrophils Relative %: 81 %
Platelets: 233 10*3/uL (ref 150–400)
RBC: 4.33 MIL/uL (ref 3.87–5.11)
RDW: 15.9 % — ABNORMAL HIGH (ref 11.5–15.5)
WBC: 14.4 10*3/uL — ABNORMAL HIGH (ref 4.0–10.5)
nRBC: 0 % (ref 0.0–0.2)

## 2021-10-19 LAB — PROTIME-INR
INR: 1.6 — ABNORMAL HIGH (ref 0.8–1.2)
Prothrombin Time: 18.8 seconds — ABNORMAL HIGH (ref 11.4–15.2)

## 2021-10-19 LAB — URINALYSIS, ROUTINE W REFLEX MICROSCOPIC
Bilirubin Urine: NEGATIVE
Glucose, UA: NEGATIVE mg/dL
Ketones, ur: NEGATIVE mg/dL
Nitrite: NEGATIVE
Protein, ur: 100 mg/dL — AB
Specific Gravity, Urine: 1.005 (ref 1.005–1.030)
WBC, UA: >50 WBC/hpf — ABNORMAL HIGH (ref 0–5)
pH: 8 (ref 5.0–8.0)

## 2021-10-19 LAB — BASIC METABOLIC PANEL
Anion gap: 13 (ref 5–15)
BUN: 17 mg/dL (ref 8–23)
CO2: 21 mmol/L — ABNORMAL LOW (ref 22–32)
Calcium: 9.6 mg/dL (ref 8.9–10.3)
Chloride: 103 mmol/L (ref 98–111)
Creatinine, Ser: 0.99 mg/dL (ref 0.44–1.00)
GFR, Estimated: >60 mL/min (ref >60)
Glucose, Bld: 100 mg/dL — ABNORMAL HIGH (ref 70–99)
Potassium: 2.9 mmol/L — ABNORMAL LOW (ref 3.5–5.1)
Sodium: 137 mmol/L (ref 135–145)

## 2021-10-19 LAB — SARS CORONAVIRUS 2 BY RT PCR: SARS Coronavirus 2 by RT PCR: NEGATIVE

## 2021-10-19 MED ORDER — LACTATED RINGERS IV SOLN: INTRAVENOUS | Status: DC

## 2021-10-19 MED ORDER — ALBUTEROL SULFATE HFA 108 (90 BASE) MCG/ACT IN AERS
2.0000 | INHALATION_SPRAY | RESPIRATORY_TRACT | Status: DC | PRN
Start: 2021-10-19 — End: 2021-10-19

## 2021-10-19 MED ORDER — LACTATED RINGERS IV BOLUS
1000.0000 mL | Freq: Once | INTRAVENOUS | Status: AC
  Administered 2021-10-19: 1000 mL via INTRAVENOUS

## 2021-10-19 MED ORDER — SODIUM CHLORIDE (PF) 0.9 % IJ SOLN
INTRAMUSCULAR | Status: AC
  Filled 2021-10-19: qty 50

## 2021-10-19 MED ORDER — IOHEXOL 350 MG/ML SOLN
80.0000 mL | Freq: Once | INTRAVENOUS | Status: AC | PRN
  Administered 2021-10-19: 80 mL via INTRAVENOUS

## 2021-10-19 MED ORDER — POTASSIUM CHLORIDE CRYS ER 20 MEQ PO TBCR
40.0000 meq | EXTENDED_RELEASE_TABLET | Freq: Once | ORAL | Status: AC
  Administered 2021-10-19: 40 meq via ORAL
  Filled 2021-10-19: qty 2

## 2021-10-19 NOTE — Discharge Instructions (Signed)
Follow-up with your doctor next week for repeat potassium.  Take your antibiotics as directed.  Return here for any problems

## 2021-10-19 NOTE — ED Triage Notes (Signed)
Pt presents with c/o shortness of breath. Pt went to UC today and was diagnosed with bacterial pneumonia. Pt also c/o left flank pain, has a nephrostomy on that side.

## 2021-10-19 NOTE — ED Notes (Incomplete)
Pt ambulated to restroom with no distress. Tech checked RA sats upon her return.

## 2021-10-19 NOTE — ED Provider Notes (Signed)
[facility] DEPT Provider Note   CSN: [id number] Arrival date & time: 10/19/21  1503     History  Chief Complaint  Patient presents with   Shortness of Breath    Donna [name] is a 61 y.o. female.  61 year old female who presents with several days of cough and shortness of breath.  Seen in urgent care and diagnosed with possible pneumonia.  Patient also notes increased left-sided flank pain.  Does have a history of nephrostomy tube secondary to complications from her ovarian cancer.  She has had fevers at home up to 102 degrees.  Most recent temperature at home was 100.  Denies any vomiting but had some diarrhea.  Was given a dose of Rocephin IM and has been placed on Levaquin as well as doxycycline.  Came in because she was feeling worse      Home Medications Prior to Admission medications   Medication Sig Start Date End Date Taking? Authorizing Provider  albuterol (VENTOLIN HFA) 108 (90 Base) MCG/ACT inhaler Inhale 2 puffs into the lungs every 6 (six) hours as needed for wheezing or shortness of breath. 09/01/21   Rudolpho Sevin, NP  ALPRAZolam Duanne Moron) 0.5 MG tablet Take 1 tablet (0.5 mg total) by mouth 3 (three) times daily as needed. 06/11/21   Susy Frizzle, MD  amLODipine (NORVASC) 10 MG tablet Take 1 tablet by mouth once daily Patient taking differently: Take 10 mg by mouth daily. Atypical angina/hypertension 07/31/21   Susy Frizzle, MD  aspirin EC 81 MG tablet Take 81 mg by mouth daily. Swallow whole.    [provider]  atorvastatin (LIPITOR) 80 MG tablet Take 1 tablet (80 mg total) by mouth daily. 12/19/20   Susy Frizzle, MD  calcium carbonate (OS-CAL - DOSED IN MG OF ELEMENTAL CALCIUM) 1250 (500 Ca) MG tablet Take 1 tablet (500 mg of elemental calcium total) by mouth 3 (three) times daily with meals. Patient taking differently: Take 1 tablet by mouth daily with breakfast. 08/17/20   Eugenie Filler, MD  Cholecalciferol  (VITAMIN D3) 50 MCG (2000 UT) capsule Take 2,000 Units by mouth daily. 10/17/18   [provider]  gabapentin (NEURONTIN) 300 MG capsule TAKE 1 CAPSULE TWICE A DAY Patient taking differently: Take 300 mg by mouth 2 (two) times daily. 09/02/21   Susy Frizzle, MD  leflunomide (ARAVA) 20 MG tablet Take 20 mg by mouth daily. 01/20/19   [provider]  levothyroxine (SYNTHROID) 175 MCG tablet TAKE 1 TABLET DAILY BEFORE BREAKFAST Patient taking differently: Take 175 mcg by mouth daily before breakfast. 01/28/21   Susy Frizzle, MD  losartan (COZAAR) 50 MG tablet Take 1 tablet by mouth once daily 07/31/21   Susy Frizzle, MD  MAGNESIUM PO Take 800 mg by mouth daily.    [provider]  omeprazole (PRILOSEC) 40 MG capsule Take 1 capsule (40 mg total) by mouth in the morning and at bedtime. 01/16/20   Susy Frizzle, MD  ondansetron (ZOFRAN) 8 MG tablet Take 8 mg by mouth daily as needed for nausea, vomiting or refractory nausea / vomiting. 08/10/18   [provider]  oxybutynin (DITROPAN) 5 MG tablet Take 5 mg by mouth 3 (three) times daily. 07/25/21   [provider]  oxyCODONE (OXY IR/ROXICODONE) 5 MG immediate release tablet Take 5 mg by mouth 4 (four) times daily as needed for moderate pain. 07/30/21   [provider]  Polyvinyl Alcohol-Povidone (Kinde OP) Place  1 drop into both eyes See admin instructions. Take 1 drop both eyes on the day before, day of and 7 days after chemotherapy    [provider]  potassium chloride SA (KLOR-CON M) 20 MEQ tablet Take 2 tablets by mouth once daily 10/09/21   Susy Frizzle, MD  PREDNISOLONE ACETATE OP Place 1 drop into both eyes See admin instructions. Take 1 drop both eyes on the day before, day of and 7 days after chemotherapy    [provider]  tamsulosin (FLOMAX) 0.4 MG CAPS capsule Take 0.4 mg by mouth daily. 07/25/21   [provider]  vitamin B-12 (CYANOCOBALAMIN) 1000  MCG tablet Take 1,000 mcg by mouth daily.    [provider]  warfarin (COUMADIN) 5 MG tablet TAKE ONE AND ONE-HALF TABLETS ONCE DAILY ON TUESDAY, THURSDAY, SATURDAY AND SUNDAY, THEN TAKE 2 TABLETS ONCE DAILY ON OTHER DAYS Patient taking differently: Take 5-10 mg by mouth See admin instructions. Take '5mg'$  by mouth Monday, Wednesday, Friday, then take '10mg'$  all other days 04/04/21   Susy Frizzle, MD      Allergies    Paclitaxel    Review of Systems   Review of Systems  All other systems reviewed and are negative.  Physical Exam Updated Vital Signs BP 115/72   Pulse 82   Temp 97.7 F (36.5 C) (Oral)   Resp 15   SpO2 100%  Physical Exam Vitals and nursing note reviewed.  Constitutional:      General: She is not in acute distress.    Appearance: Normal appearance. She is well-developed. She is not toxic-appearing.  HENT:     Head: Normocephalic and atraumatic.  Eyes:     General: Lids are normal.     Conjunctiva/sclera: Conjunctivae normal.     Pupils: Pupils are equal, round, and reactive to light.  Neck:     Thyroid: No thyroid mass.     Trachea: No tracheal deviation.  Cardiovascular:     Rate and Rhythm: Normal rate and regular rhythm.     Heart sounds: Normal heart sounds. No murmur heard.   No gallop.  Pulmonary:     Effort: Pulmonary effort is normal. No respiratory distress.     Breath sounds: Normal breath sounds. No stridor. No decreased breath sounds, wheezing, rhonchi or rales.  Abdominal:     General: There is no distension.     Palpations: Abdomen is soft.     Tenderness: There is no abdominal tenderness. There is no rebound.  Musculoskeletal:        General: No tenderness. Normal range of motion.     Cervical back: Normal range of motion and neck supple.  Skin:    General: Skin is warm and dry.     Findings: No abrasion or rash.  Neurological:     Mental Status: She is alert and oriented to person, place, and time. Mental status is at  baseline.     GCS: GCS eye subscore is 4. GCS verbal subscore is 5. GCS motor subscore is 6.     Cranial Nerves: No cranial nerve deficit.     Sensory: No sensory deficit.     Motor: Motor function is intact.  Psychiatric:        Attention and Perception: Attention normal.        Speech: Speech normal.        Behavior: Behavior normal.    ED Results / Procedures / Treatments   Labs (all labs  ordered are listed, but only abnormal results are displayed) Labs Reviewed  CBC WITH DIFFERENTIAL/PLATELET  URINALYSIS, ROUTINE W REFLEX MICROSCOPIC  BASIC METABOLIC PANEL    EKG EKG Interpretation  Date/Time:  Saturday October 19 2021 15:11:04 EDT Ventricular Rate:  89 PR Interval:  153 QRS Duration: 90 QT Interval:  391 QTC Calculation: 476 R Axis:   11 Text Interpretation: Sinus rhythm Left atrial enlargement Low voltage, precordial leads Borderline T abnormalities, anterior leads No significant change since last tracing Confirmed by Lacretia Leigh ([id number]) on 10/19/2021 5:21:00 PM  Radiology DG Chest 2 View  Result Date: 10/19/2021 CLINICAL DATA:  shortness of breath EXAM: CHEST - 2 VIEW.  Patient is slightly rotated on the frontal view. COMPARISON:  Chest x-ray 09/01/2021, chest CT chest 08/01/2021 FINDINGS: The heart and mediastinal contours are within normal limits. Biapical pleural/pulmonary scarring. No focal consolidation. No pulmonary edema. No pleural effusion. No pneumothorax. No acute osseous abnormality. IMPRESSION: No active cardiopulmonary disease. Electronically Signed   By: Iven Finn M.D.   On: 10/19/2021 15:45    Procedures Procedures    Medications Ordered in ED Medications  lactated ringers bolus 1,000 mL (has no administration in time range)  lactated ringers infusion (has no administration in time range)    ED Course/ Medical Decision Making/ A&P                           Medical Decision Making Amount and/or Complexity of Data Reviewed Labs:  ordered. Radiology: ordered.  Risk Prescription drug management.   Patient is EKG per my interpretation shows sinus rhythm without signs of acute ischemic changes.  Patient's chest x-ray per interpretation shows no acute process.  Patient had flank pain and renal CT per my interpretation showed no hydronephrosis.  Urinalysis was reviewed and shows infection.  Patient is already on Levaquin and will continue this.  INR was checked and due to possible interaction will need to be monitored closely.  Here was 1.6 which is normal.  Patient also complained of being short of breath and chest CT per my review interpretation was negative for PE.  She did have a pulmonary nodule but patient states that this is currently being followed by her oncologist at [facility] and they are aware of this.  She was mildly hypokalemia and this was treated with oral potassium here.  She is COVID-negative.  Plan will be for her to continue her antibiotics.  She does not require hospitalization at this time.  She is comfortable with this plan for discharge        Final Clinical Impression(s) / ED Diagnoses Final diagnoses:  None    Rx / DC Orders ED Discharge Orders     None         Lacretia Leigh, MD 10/19/21 1944

## 2021-10-25 DIAGNOSIS — Z79899 Other long term (current) drug therapy: Secondary | ICD-10-CM | POA: Diagnosis not present

## 2021-10-25 DIAGNOSIS — C563 Malignant neoplasm of bilateral ovaries: Secondary | ICD-10-CM | POA: Diagnosis not present

## 2021-10-25 DIAGNOSIS — N1330 Unspecified hydronephrosis: Secondary | ICD-10-CM | POA: Diagnosis not present

## 2021-10-30 NOTE — Patient Instructions (Signed)
Ms. Donna [name] , Thank you for taking time to come for your Medicare Wellness Visit. I appreciate your ongoing commitment to your health goals. Please review the following plan we discussed and let me know if I can assist you in the future.   Screening recommendations/referrals: Colonoscopy: Done 07/18/2010 Repeat in 10 years  Mammogram: Done 07/05/2009 Repeat annually  Bone Density: Due at age 61.  Recommended yearly ophthalmology/optometry visit for glaucoma screening and checkup Recommended yearly dental visit for hygiene and checkup  Vaccinations: Influenza vaccine: Done 03/07/2021 Repeat annually  Pneumococcal vaccine: Due at age 42. Tdap vaccine: Done 08/22/2018 Repeat in 10 years  Shingles vaccine: Discussed. Available at your local pharmacy.  Covid-19: Done 09/05/2019 and 10/05/2019.  Advanced directives: Please bring a copy of your health care power of attorney and living will to the office to be added to your chart at your convenience.   Conditions/risks identified: Aim for 30 minutes of exercise or brisk walking, 6-8 glasses of water, and 5 servings of fruits and vegetables each day.  Next appointment: Follow up in one year for your annual wellness visit. 2024.  Preventive Care 40-64 Years, Female Preventive care refers to lifestyle choices and visits with your health care provider that can promote health and wellness. What does preventive care include? A yearly physical exam. This is also called an annual well check. Dental exams once or twice a year. Routine eye exams. Ask your health care provider how often you should have your eyes checked. Personal lifestyle choices, including: Daily care of your teeth and gums. Regular physical activity. Eating a healthy diet. Avoiding tobacco and drug use. Limiting alcohol use. Practicing safe sex. Taking low-dose aspirin daily starting at age 23. Taking vitamin and mineral supplements as recommended by your health care  provider. What happens during an annual well check? The services and screenings done by your health care provider during your annual well check will depend on your age, overall health, lifestyle risk factors, and family history of disease. Counseling  Your health care provider may ask you questions about your: Alcohol use. Tobacco use. Drug use. Emotional well-being. Home and relationship well-being. Sexual activity. Eating habits. Work and work Statistician. Method of birth control. Menstrual cycle. Pregnancy history. Screening  You may have the following tests or measurements: Height, weight, and BMI. Blood pressure. Lipid and cholesterol levels. These may be checked every 5 years, or more frequently if you are over 46 years old. Skin check. Lung cancer screening. You may have this screening every year starting at age 66 if you have a 30-pack-year history of smoking and currently smoke or have quit within the past 15 years. Fecal occult blood test (FOBT) of the stool. You may have this test every year starting at age 9. Flexible sigmoidoscopy or colonoscopy. You may have a sigmoidoscopy every 5 years or a colonoscopy every 10 years starting at age 61. Hepatitis C blood test. Hepatitis B blood test. Sexually transmitted disease (STD) testing. Diabetes screening. This is done by checking your blood sugar (glucose) after you have not eaten for a while (fasting). You may have this done every 1-3 years. Mammogram. This may be done every 1-2 years. Talk to your health care provider about when you should start having regular mammograms. This may depend on whether you have a family history of breast cancer. BRCA-related cancer screening. This may be done if you have a family history of breast, ovarian, tubal, or peritoneal cancers. Pelvic exam and Pap test. This may  be done every 3 years starting at age 76. Starting at age 69, this may be done every 5 years if you have a Pap test in  combination with an HPV test. Bone density scan. This is done to screen for osteoporosis. You may have this scan if you are at high risk for osteoporosis. Discuss your test results, treatment options, and if necessary, the need for more tests with your health care provider. Vaccines  Your health care provider may recommend certain vaccines, such as: Influenza vaccine. This is recommended every year. Tetanus, diphtheria, and acellular pertussis (Tdap, Td) vaccine. You may need a Td booster every 10 years. Zoster vaccine. You may need this after age 36. Pneumococcal 13-valent conjugate (PCV13) vaccine. You may need this if you have certain conditions and were not previously vaccinated. Pneumococcal polysaccharide (PPSV23) vaccine. You may need one or two doses if you smoke cigarettes or if you have certain conditions. Talk to your health care provider about which screenings and vaccines you need and how often you need them. This information is not intended to replace advice given to you by your health care provider. Make sure you discuss any questions you have with your health care provider. Document Released: 06/01/2015 Document Revised: 01/23/2016 Document Reviewed: 03/06/2015 Elsevier Interactive Patient Education  2017 [place] Prevention in the Home Falls can cause injuries. They can happen to people of all ages. There are many things you can do to make your home safe and to help prevent falls. What can I do on the outside of my home? Regularly fix the edges of walkways and driveways and fix any cracks. Remove anything that might make you trip as you walk through a door, such as a raised step or threshold. Trim any bushes or trees on the path to your home. Use bright outdoor lighting. Clear any walking paths of anything that might make someone trip, such as rocks or tools. Regularly check to see if handrails are loose or broken. Make sure that both sides of any steps have  handrails. Any raised decks and porches should have guardrails on the edges. Have any leaves, snow, or ice cleared regularly. Use sand or salt on walking paths during winter. Clean up any spills in your garage right away. This includes oil or grease spills. What can I do in the bathroom? Use night lights. Install grab bars by the toilet and in the tub and shower. Do not use towel bars as grab bars. Use non-skid mats or decals in the tub or shower. If you need to sit down in the shower, use a plastic, non-slip stool. Keep the floor dry. Clean up any water that spills on the floor as soon as it happens. Remove soap buildup in the tub or shower regularly. Attach bath mats securely with double-sided non-slip rug tape. Do not have throw rugs and other things on the floor that can make you trip. What can I do in the bedroom? Use night lights. Make sure that you have a light by your bed that is easy to reach. Do not use any sheets or blankets that are too big for your bed. They should not hang down onto the floor. Have a firm chair that has side arms. You can use this for support while you get dressed. Do not have throw rugs and other things on the floor that can make you trip. What can I do in the kitchen? Clean up any spills right away. Avoid  walking on wet floors. Keep items that you use a lot in easy-to-reach places. If you need to reach something above you, use a strong step stool that has a grab bar. Keep electrical cords out of the way. Do not use floor polish or wax that makes floors slippery. If you must use wax, use non-skid floor wax. Do not have throw rugs and other things on the floor that can make you trip. What can I do with my stairs? Do not leave any items on the stairs. Make sure that there are handrails on both sides of the stairs and use them. Fix handrails that are broken or loose. Make sure that handrails are as long as the stairways. Check any carpeting to make sure  that it is firmly attached to the stairs. Fix any carpet that is loose or worn. Avoid having throw rugs at the top or bottom of the stairs. If you do have throw rugs, attach them to the floor with carpet tape. Make sure that you have a light switch at the top of the stairs and the bottom of the stairs. If you do not have them, ask someone to add them for you. What else can I do to help prevent falls? Wear shoes that: Do not have high heels. Have rubber bottoms. Are comfortable and fit you well. Are closed at the toe. Do not wear sandals. If you use a stepladder: Make sure that it is fully opened. Do not climb a closed stepladder. Make sure that both sides of the stepladder are locked into place. Ask someone to hold it for you, if possible. Clearly mark and make sure that you can see: Any grab bars or handrails. First and last steps. Where the edge of each step is. Use tools that help you move around (mobility aids) if they are needed. These include: Canes. Walkers. Scooters. Crutches. Turn on the lights when you go into a dark area. Replace any light bulbs as soon as they burn out. Set up your furniture so you have a clear path. Avoid moving your furniture around. If any of your floors are uneven, fix them. If there are any pets around you, be aware of where they are. Review your medicines with your doctor. Some medicines can make you feel dizzy. This can increase your chance of falling. Ask your doctor what other things that you can do to help prevent falls. This information is not intended to replace advice given to you by your health care provider. Make sure you discuss any questions you have with your health care provider. Document Released: 03/01/2009 Document Revised: 10/11/2015 Document Reviewed: 06/09/2014 Elsevier Interactive Patient Education  2017 [employer].

## 2021-10-31 ENCOUNTER — Other Ambulatory Visit: Payer: Medicare (Managed Care)

## 2021-10-31 ENCOUNTER — Ambulatory Visit: Payer: Self-pay

## 2021-10-31 ENCOUNTER — Ambulatory Visit (INDEPENDENT_AMBULATORY_CARE_PROVIDER_SITE_OTHER): Payer: Medicare (Managed Care)

## 2021-10-31 VITALS — BP 122/70 | HR 85 | Ht 67.5 in | Wt 214.0 lb

## 2021-10-31 DIAGNOSIS — D696 Thrombocytopenia, unspecified: Secondary | ICD-10-CM

## 2021-10-31 DIAGNOSIS — C569 Malignant neoplasm of unspecified ovary: Secondary | ICD-10-CM

## 2021-10-31 DIAGNOSIS — Z0000 Encounter for general adult medical examination without abnormal findings: Secondary | ICD-10-CM | POA: Diagnosis not present

## 2021-10-31 DIAGNOSIS — E876 Hypokalemia: Secondary | ICD-10-CM

## 2021-10-31 LAB — PT WITH INR/FINGERSTICK
INR, fingerstick: 3.4 ratio — ABNORMAL HIGH
PT, fingerstick: 40.9 s — ABNORMAL HIGH (ref 10.5–13.1)

## 2021-10-31 NOTE — Addendum Note (Signed)
Addended by: Randal Buba K on: 10/31/2021 11:44 AM   Modules accepted: Orders

## 2021-10-31 NOTE — Addendum Note (Signed)
Addended by: Randal Buba K on: 10/31/2021 11:53 AM   Modules accepted: Orders

## 2021-10-31 NOTE — Progress Notes (Signed)
Subjective:   Donna [name] is a 61 y.o. female who presents for an Initial Medicare Annual Wellness Visit.  Review of Systems     Cardiac Risk Factors include: advanced age (>28mn, >>61women);hypertension;dyslipidemia;sedentary lifestyle;obesity (BMI >30kg/m2);Other (see comment), Risk factor comments: Currently undergoing chemotherapy tx for ovarian ca.     Objective:    Today's Vitals   10/31/21 1102 10/31/21 1105  BP: 122/70   Pulse: 85   SpO2: 97%   Weight: 214 lb (97.1 kg)   Height: 5' 7.5" (1.715 m)   PainSc:  7    Body mass index is 33.02 kg/m.     10/31/2021   11:15 AM 10/19/2021    3:16 PM 08/15/2020    3:08 AM 08/15/2014    6:50 AM 05/13/2012   11:11 AM 05/07/2012    9:52 AM  Advanced Directives  Does Patient Have a Medical Advance Directive? No No No No  Patient does not have advance directive  Would patient like information on creating a medical advance directive? No - Patient declined No - Patient declined No - Patient declined No - patient declined information    Pre-existing out of facility DNR order (yellow form or pink MOST form)     No     Current Medications (verified) Outpatient Encounter Medications as of 10/31/2021  Medication Sig   albuterol (VENTOLIN HFA) 108 (90 Base) MCG/ACT inhaler Inhale 2 puffs into the lungs every 6 (six) hours as needed for wheezing or shortness of breath.   ALPRAZolam (XANAX) 0.5 MG tablet Take 1 tablet (0.5 mg total) by mouth 3 (three) times daily as needed. (Patient taking differently: Take 0.5 mg by mouth 3 (three) times daily as needed for anxiety.)   amLODipine (NORVASC) 10 MG tablet Take 1 tablet by mouth once daily (Patient taking differently: Take 10 mg by mouth daily. Atypical angina/hypertension)   aspirin EC 81 MG tablet Take 81 mg by mouth daily. Swallow whole.   atorvastatin (LIPITOR) 80 MG tablet Take 1 tablet (80 mg total) by mouth daily. (Patient taking differently: Take 40 mg by mouth daily.)   calcium  carbonate (OS-CAL - DOSED IN MG OF ELEMENTAL CALCIUM) 1250 (500 Ca) MG tablet Take 1 tablet (500 mg of elemental calcium total) by mouth 3 (three) times daily with meals. (Patient taking differently: Take 1 tablet by mouth daily with breakfast.)   Cholecalciferol (VITAMIN D3) 50 MCG (2000 UT) capsule Take 2,000 Units by mouth daily.   gabapentin (NEURONTIN) 300 MG capsule TAKE 1 CAPSULE TWICE A DAY (Patient taking differently: Take 300 mg by mouth 2 (two) times daily.)   leflunomide (ARAVA) 20 MG tablet Take 20 mg by mouth daily.   levothyroxine (SYNTHROID) 175 MCG tablet TAKE 1 TABLET DAILY BEFORE BREAKFAST (Patient taking differently: Take 175 mcg by mouth daily before breakfast.)   losartan (COZAAR) 50 MG tablet Take 1 tablet by mouth once daily (Patient taking differently: Take 50 mg by mouth daily.)   MAGNESIUM PO Take 800 mg by mouth daily.   omeprazole (PRILOSEC) 40 MG capsule Take 1 capsule (40 mg total) by mouth in the morning and at bedtime. (Patient taking differently: Take 40 mg by mouth daily as needed (acid reflux / GERD).)   ondansetron (ZOFRAN) 8 MG tablet Take 8 mg by mouth daily as needed for nausea, vomiting or refractory nausea / vomiting.   oxyCODONE (OXY IR/ROXICODONE) 5 MG immediate release tablet Take 5 mg by mouth 4 (four) times daily as needed for  moderate pain.   Polyvinyl Alcohol-Povidone (REFRESH OP) Place 1 drop into both eyes See admin instructions. Take 1 drop both eyes on the day before, day of and 7 days after chemotherapy   potassium chloride SA (KLOR-CON M) 20 MEQ tablet Take 2 tablets by mouth once daily   PREDNISOLONE ACETATE OP Place 1 drop into both eyes See admin instructions. Take 1 drop both eyes on the day before, day of and 7 days after chemotherapy   vitamin B-12 (CYANOCOBALAMIN) 1000 MCG tablet Take 1,000 mcg by mouth daily.   warfarin (COUMADIN) 5 MG tablet TAKE ONE AND ONE-HALF TABLETS ONCE DAILY ON TUESDAY, THURSDAY, SATURDAY AND SUNDAY, THEN TAKE 2  TABLETS ONCE DAILY ON OTHER DAYS (Patient taking differently: Take 5-10 mg by mouth See admin instructions. Take '5mg'$  by mouth Monday, Wednesday, Friday, then take '10mg'$  all other days)   No facility-administered encounter medications on file as of 10/31/2021.    Allergies (verified) Paclitaxel   History: Past Medical History:  Diagnosis Date   Anxiety    Arterial thrombosis (HCC)    right sfa, popliteal artery.  Require thromectomy at [facility] and lifelong anticoagulation.   Cancer (HCC)    ovarian, recurrent on chemo (carboplatin, doxorubicin) at [facility]   Colon polyp    Depression    Full dentures    GERD (gastroesophageal reflux disease)    HLD (hyperlipidemia) 12/03/2012   Hypertension    Hypothyroidism    IBS (irritable bowel syndrome)    Neck mass    Nephrostomy status ([place])    Neuropathy    feet   Rheumatoid arthritis ([place])    at [facility] ring    Wears glasses    Past Surgical History:  Procedure Laterality Date   ABDOMINAL HYSTERECTOMY     ABLATION     COLONOSCOPY     CYST REMOVAL NECK  05/13/12   EAR CYST EXCISION  05/13/2012   Procedure: CYST REMOVAL;  Surgeon: Ralene Ok, MD;  Location: [facility];  Service: General;  Laterality: Left;  excision of neck cyst   IR NEPHROSTOMY EXCHANGE LEFT  09/02/2021   THYROID SURGERY  10/2011 - approximate   ablation   TONSILLECTOMY     UPPER GI ENDOSCOPY     Family History  Problem Relation Age of Onset   Heart attack Mother    Hypertension Mother    Diabetes Mother    Heart disease Mother    CVA Father    Hypertension Father    Diabetes Father    Stroke Father    Heart attack Sister        CABG   Diabetes Sister    Stroke Sister    Hypertension Sister    Hyperlipidemia Sister    Kidney disease Sister    Stroke Sister    Heart attack Sister    Cancer Brother        lung cancer - stage 32   Cancer Brother        skin    Heart disease Maternal Grandfather    Social History   Socioeconomic History    Marital status: Single    Spouse name: Not on file   Number of children: Not on file   Years of education: Not on file   Highest education level: Not on file  Occupational History   Not on file  Tobacco Use   Smoking status: Former    Years: 32.00    Types: Cigarettes  Quit date: 06/19/2011    Years since quitting: 10.3   Smokeless tobacco: Never  Vaping Use   Vaping Use: Never used  Substance and Sexual Activity   Alcohol use: No   Drug use: No   Sexual activity: Not on file  Other Topics Concern   Not on file  Social History Narrative   Not on file   Social Determinants of Health   Financial Resource Strain: Low Risk  (10/31/2021)   Overall Financial Resource Strain (CARDIA)    Difficulty of Paying Living Expenses: Not hard at all  Food Insecurity: No Food Insecurity (10/31/2021)   Hunger Vital Sign    Worried About Running Out of Food in the Last Year: Never true    Ran Out of Food in the Last Year: Never true  Transportation Needs: No Transportation Needs (10/31/2021)   PRAPARE - Hydrologist (Medical): No    Lack of Transportation (Non-Medical): No  Physical Activity: Insufficiently Active (10/31/2021)   Exercise Vital Sign    Days of Exercise per Week: 3 days    Minutes of Exercise per Session: 20 min  Stress: No Stress Concern Present (10/31/2021)   [place]    Feeling of Stress : Only a little  Social Connections: Moderately Integrated (10/31/2021)   Social Connection and Isolation Panel [NHANES]    Frequency of Communication with Friends and Family: More than three times a week    Frequency of Social Gatherings with Friends and Family: Twice a week    Attends Religious Services: More than 4 times per year    Active Member of [employer] or Organizations: Yes    Attends Music therapist: More than 4 times per year    Marital Status: Never married    Tobacco  Counseling Counseling given: Not Answered   Clinical Intake:  Pre-visit preparation completed: Yes  Pain : 0-10 Pain Score: 7  Pain Type: Chronic pain Pain Location: Foot (Avascular Necrosis) Pain Orientation: Right Pain Descriptors / Indicators: Discomfort, Burning Pain Onset: More than a month ago Pain Frequency: Intermittent     BMI - recorded: 33.02 Nutritional Status: BMI > 30  Obese Nutritional Risks: None Diabetes: No  How often do you need to have someone help you when you read instructions, pamphlets, or other written materials from your doctor or pharmacy?: 1 - Never  Diabetic?NO  Interpreter Needed?: No  Information entered by :: mj Kassia Demarinis, lpn   Activities of Daily Living    10/31/2021   11:19 AM 10/30/2021    1:18 PM  In your present state of health, do you have any difficulty performing the following activities:  Hearing? 0 0  Vision? 0 0  Difficulty concentrating or making decisions? 0 0  Walking or climbing stairs? 1 1  Dressing or bathing? 0 0  Doing errands, shopping? 1 1  Preparing Food and eating ? N N  Using the Toilet? N N  In the past six months, have you accidently leaked urine? N N  Do you have problems with loss of bowel control? N N  Managing your Medications? N N  Managing your Finances? N N  Housekeeping or managing your Housekeeping? N N    Patient Care Team: Susy Frizzle, MD as PCP - General (Family Medicine)  Indicate any recent Medical Services you may have received from other than [facility] providers in the past year (date may be approximate).  Assessment:   This is a routine wellness examination for Lameeka.  Hearing/Vision screen Hearing Screening - Comments:: No hearing issues.  Vision Screening - Comments:: Glasses. [facility] in [place]. 2023.  Dietary issues and exercise activities discussed: Current Exercise Habits: Home exercise routine, Type of exercise: Other - see comments (stationary bike.), Time  (Minutes): 20, Frequency (Times/Week): 3, Weekly Exercise (Minutes/Week): 60, Intensity: Mild, Exercise limited by: cardiac condition(s);respiratory conditions(s)   Goals Addressed             This Visit's Progress    Exercise 3x per week (30 min per time)       Try chair exercises.        Depression Screen    10/31/2021   11:11 AM 07/01/2021    4:08 PM 11/04/2017    3:03 PM  PHQ 2/9 Scores  PHQ - 2 Score '1 1 2  '$ PHQ- 9 Score   2    Fall Risk    10/31/2021   11:16 AM 10/30/2021    1:18 PM 07/01/2021    4:08 PM  Fall Risk   Falls in the past year? 0 0 0  Number falls in past yr: 0 0   Injury with Fall? 0 0   Risk for fall due to : No Fall Risks    Follow up Falls prevention discussed      [place]:  Any stairs in or around the home? No  If so, are there any without handrails? No  Home free of loose throw rugs in walkways, pet beds, electrical cords, etc? Yes  Adequate lighting in your home to reduce risk of falls? Yes   ASSISTIVE DEVICES UTILIZED TO PREVENT FALLS:  Life alert? No  Use of a cane, walker or w/c? No  Grab bars in the bathroom? No  Shower chair or bench in shower? Yes  Elevated toilet seat or a handicapped toilet? No   TIMED UP AND GO:  Was the test performed? Yes .  Length of time to ambulate 10 feet: 12 sec.   Gait slow and steady without use of assistive device  Cognitive Function:        10/31/2021   11:19 AM  6CIT Screen  What Year? 0 points  What month? 0 points  What time? 0 points  Count back from 20 0 points  Months in reverse 0 points  Repeat phrase 0 points  Total Score 0 points    Immunizations Immunization History  Administered Date(s) Administered   Fluad Quad(high Dose 65+) 03/07/2021   Influenza,inj,Quad PF,6+ Mos 03/23/2015, 02/11/2016, 04/07/2017, 03/12/2018, 01/25/2019, 02/03/2020   Moderna Sars-Covid-2 Vaccination 09/05/2019, 10/05/2019   Pneumococcal Polysaccharide-23  11/04/2017   Tdap 08/22/2018    TDAP status: Up to date  Flu Vaccine status: Up to date  Pneumococcal vaccine status: Up to date  Covid-19 vaccine status: Completed vaccines  Qualifies for Shingles Vaccine? Yes   Zostavax completed No   Shingrix Completed?: No.    Education has been provided regarding the importance of this vaccine. Patient has been advised to call insurance company to determine out of pocket expense if they have not yet received this vaccine. Advised may also receive vaccine at local pharmacy or Health Dept. Verbalized acceptance and understanding.  Screening Tests Health Maintenance  Topic Date Due   COVID-19 Vaccine (3 - Moderna risk series) 11/16/2021 (Originally 11/02/2019)   PAP SMEAR-Modifier  05/16/2022 (Originally 10/24/2017)   COLONOSCOPY (Pts 45-38yr Insurance coverage  will need to be confirmed)  05/16/2022 (Originally 07/17/2020)   Zoster Vaccines- Shingrix (1 of 2) 05/16/2022 (Originally 05/29/1979)   INFLUENZA VACCINE  12/17/2021   TETANUS/TDAP  08/21/2028   Hepatitis C Screening  Completed   HIV Screening  Completed   HPV VACCINES  Aged Out   MAMMOGRAM  Discontinued    Health Maintenance  There are no preventive care reminders to display for this patient.   Colorectal cancer screening: No longer required.   Mammogram status: No longer required due to ovarian cancer dx per pt.  Bone Density status: Ordered due at age 2. Pt provided with contact info and advised to call to schedule appt.  Lung Cancer Screening: (Low Dose CT Chest recommended if Age 56-80 years, 30 pack-year currently smoking OR have quit w/in 15years.) does not qualify.   Additional Screening:  Hepatitis C Screening: does qualify; Completed 11/04/2017  Vision Screening: Recommended annual ophthalmology exams for early detection of glaucoma and other disorders of the eye. Is the patient up to date with their annual eye exam?  Yes  Who is the provider or what is the name of the  office in which the patient attends annual eye exams? [facility] in [place] If pt is not established with a provider, would they like to be referred to a provider to establish care? No .   Dental Screening: Recommended annual dental exams for proper oral hygiene  Community Resource Referral / Chronic Care Management: CRR required this visit?  No   CCM required this visit?  No      Plan:     I have personally reviewed and noted the following in the patient's chart:   Medical and social history Use of alcohol, tobacco or illicit drugs  Current medications and supplements including opioid prescriptions. Patient is currently taking opioid prescriptions. Information provided to patient regarding non-opioid alternatives. Patient advised to discuss non-opioid treatment plan with their provider. Functional ability and status Nutritional status Physical activity Advanced directives List of other physicians Hospitalizations, surgeries, and ER visits in previous 12 months Vitals Screenings to include cognitive, depression, and falls Referrals and appointments  In addition, I have reviewed and discussed with patient certain preventive protocols, quality metrics, and best practice recommendations. A written personalized care plan for preventive services as well as general preventive health recommendations were provided to patient.     Chriss Driver, LPN   12/11/3662   Nurse Notes: Discussed Shingrix, Mammogram and Colonoscopy. Pt states she was advised by Oncology to not obtain Shingrix due to current Chemotherapy tx. Was advised by Oncologist that Mammogram is not needed. Advised pt to discuss with Oncology if Colonoscopy is needed.

## 2021-11-07 DIAGNOSIS — I749 Embolism and thrombosis of unspecified artery: Secondary | ICD-10-CM | POA: Diagnosis not present

## 2021-11-07 DIAGNOSIS — M87074 Idiopathic aseptic necrosis of right foot: Secondary | ICD-10-CM | POA: Diagnosis not present

## 2021-11-07 DIAGNOSIS — C563 Malignant neoplasm of bilateral ovaries: Secondary | ICD-10-CM | POA: Diagnosis not present

## 2021-11-07 DIAGNOSIS — M79671 Pain in right foot: Secondary | ICD-10-CM | POA: Diagnosis not present

## 2021-11-07 DIAGNOSIS — G620 Drug-induced polyneuropathy: Secondary | ICD-10-CM | POA: Diagnosis not present

## 2021-11-08 ENCOUNTER — Other Ambulatory Visit: Payer: Self-pay | Admitting: Family Medicine

## 2021-11-08 ENCOUNTER — Ambulatory Visit (INDEPENDENT_AMBULATORY_CARE_PROVIDER_SITE_OTHER): Payer: Medicare (Managed Care) | Admitting: Family Medicine

## 2021-11-08 VITALS — BP 142/82 | HR 88 | Temp 98.4°F | Ht 67.0 in | Wt 218.0 lb

## 2021-11-08 DIAGNOSIS — M5430 Sciatica, unspecified side: Secondary | ICD-10-CM | POA: Diagnosis not present

## 2021-11-08 DIAGNOSIS — Z7901 Long term (current) use of anticoagulants: Secondary | ICD-10-CM | POA: Diagnosis not present

## 2021-11-08 DIAGNOSIS — M05741 Rheumatoid arthritis with rheumatoid factor of right hand without organ or systems involvement: Secondary | ICD-10-CM

## 2021-11-08 DIAGNOSIS — M05742 Rheumatoid arthritis with rheumatoid factor of left hand without organ or systems involvement: Secondary | ICD-10-CM | POA: Diagnosis not present

## 2021-11-08 LAB — PT WITH INR/FINGERSTICK
INR, fingerstick: 2 ratio — ABNORMAL HIGH
PT, fingerstick: 24.5 s — ABNORMAL HIGH (ref 10.5–13.1)

## 2021-11-08 MED ORDER — PREDNISONE 20 MG PO TABS: ORAL_TABLET | ORAL | 0 refills | Status: DC

## 2021-11-08 MED ORDER — PREDNISONE 5 MG PO TABS: 5.0000 mg | ORAL_TABLET | Freq: Every day | ORAL | 0 refills | Status: DC

## 2021-11-11 DIAGNOSIS — M19071 Primary osteoarthritis, right ankle and foot: Secondary | ICD-10-CM | POA: Diagnosis not present

## 2021-11-11 DIAGNOSIS — S92001A Unspecified fracture of right calcaneus, initial encounter for closed fracture: Secondary | ICD-10-CM | POA: Diagnosis not present

## 2021-11-11 DIAGNOSIS — Q666 Other congenital valgus deformities of feet: Secondary | ICD-10-CM | POA: Diagnosis not present

## 2021-11-13 DIAGNOSIS — Z79899 Other long term (current) drug therapy: Secondary | ICD-10-CM | POA: Diagnosis not present

## 2021-11-13 DIAGNOSIS — C563 Malignant neoplasm of bilateral ovaries: Secondary | ICD-10-CM | POA: Diagnosis not present

## 2021-11-13 DIAGNOSIS — H16142 Punctate keratitis, left eye: Secondary | ICD-10-CM | POA: Diagnosis not present

## 2021-11-15 DIAGNOSIS — Z86718 Personal history of other venous thrombosis and embolism: Secondary | ICD-10-CM | POA: Diagnosis not present

## 2021-11-15 DIAGNOSIS — Z5112 Encounter for antineoplastic immunotherapy: Secondary | ICD-10-CM | POA: Diagnosis not present

## 2021-11-15 DIAGNOSIS — G629 Polyneuropathy, unspecified: Secondary | ICD-10-CM | POA: Diagnosis not present

## 2021-11-15 DIAGNOSIS — M059 Rheumatoid arthritis with rheumatoid factor, unspecified: Secondary | ICD-10-CM | POA: Diagnosis not present

## 2021-11-15 DIAGNOSIS — Z79899 Other long term (current) drug therapy: Secondary | ICD-10-CM | POA: Diagnosis not present

## 2021-11-15 DIAGNOSIS — R918 Other nonspecific abnormal finding of lung field: Secondary | ICD-10-CM | POA: Diagnosis not present

## 2021-11-15 DIAGNOSIS — R5383 Other fatigue: Secondary | ICD-10-CM | POA: Diagnosis not present

## 2021-11-15 DIAGNOSIS — I10 Essential (primary) hypertension: Secondary | ICD-10-CM | POA: Diagnosis not present

## 2021-11-15 DIAGNOSIS — C786 Secondary malignant neoplasm of retroperitoneum and peritoneum: Secondary | ICD-10-CM | POA: Diagnosis not present

## 2021-11-15 DIAGNOSIS — E039 Hypothyroidism, unspecified: Secondary | ICD-10-CM | POA: Diagnosis not present

## 2021-11-15 DIAGNOSIS — Z87448 Personal history of other diseases of urinary system: Secondary | ICD-10-CM | POA: Diagnosis not present

## 2021-11-15 DIAGNOSIS — Z936 Other artificial openings of urinary tract status: Secondary | ICD-10-CM | POA: Diagnosis not present

## 2021-11-15 DIAGNOSIS — Z436 Encounter for attention to other artificial openings of urinary tract: Secondary | ICD-10-CM | POA: Diagnosis not present

## 2021-11-15 DIAGNOSIS — N1330 Unspecified hydronephrosis: Secondary | ICD-10-CM | POA: Diagnosis not present

## 2021-11-15 DIAGNOSIS — C563 Malignant neoplasm of bilateral ovaries: Secondary | ICD-10-CM | POA: Diagnosis not present

## 2021-11-21 ENCOUNTER — Telehealth: Payer: Self-pay

## 2021-11-21 NOTE — Telephone Encounter (Signed)
Pt still having sciatic nerve pains even after finishing up the pain meds. Pt don't want to be on any more prednisone. Wants to talk about other pain management opitions?  What should pt do now?

## 2021-11-22 NOTE — Telephone Encounter (Signed)
Spoke with pt, per pt would like to have an MRI plus some pain meds.

## 2021-11-25 ENCOUNTER — Other Ambulatory Visit: Payer: Self-pay | Admitting: Family Medicine

## 2021-11-25 DIAGNOSIS — M5430 Sciatica, unspecified side: Secondary | ICD-10-CM

## 2021-11-25 DIAGNOSIS — M5416 Radiculopathy, lumbar region: Secondary | ICD-10-CM

## 2021-11-25 DIAGNOSIS — C569 Malignant neoplasm of unspecified ovary: Secondary | ICD-10-CM

## 2021-11-25 MED ORDER — OXYCODONE HCL 5 MG PO TABS: 5.0000 mg | ORAL_TABLET | Freq: Four times a day (QID) | ORAL | 0 refills | Status: DC | PRN

## 2021-11-25 NOTE — Telephone Encounter (Signed)
Yes, pt would like a refill on her Oxycodone, Pls varify the QTY #?  LOV 11/08/21 Last refill 07/30/21, #0, 0 refills  Please review, thanks!

## 2021-11-27 ENCOUNTER — Ambulatory Visit: Payer: Medicare (Managed Care) | Admitting: Podiatry

## 2021-11-27 DIAGNOSIS — M79675 Pain in left toe(s): Secondary | ICD-10-CM | POA: Diagnosis not present

## 2021-11-27 DIAGNOSIS — D689 Coagulation defect, unspecified: Secondary | ICD-10-CM | POA: Diagnosis not present

## 2021-11-27 DIAGNOSIS — M79674 Pain in right toe(s): Secondary | ICD-10-CM | POA: Diagnosis not present

## 2021-11-27 DIAGNOSIS — B351 Tinea unguium: Secondary | ICD-10-CM

## 2021-11-27 NOTE — Progress Notes (Signed)
This patient returns to my office for at risk foot care.  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  This patient presents for at risk foot care today.  General Appearance  Alert, conversant and in no acute stress.  Vascular  Dorsalis pedis and posterior tibial  pulses are palpable  bilaterally.  Capillary return is within normal limits  bilaterally. Temperature is within normal limits  bilaterally.  Neurologic  Senn-Weinstein monofilament wire test within normal limits  bilaterally. Muscle power within normal limits bilaterally.  Nails Thick disfigured discolored nails with subungual debris  from hallux to fifth toes bilaterally. No evidence of bacterial infection or drainage bilaterally.  Orthopedic  No limitations of motion  feet .  No crepitus or effusions noted.  No bony pathology or digital deformities noted. Right foot pain.  Skin  normotropic skin with no porokeratosis noted bilaterally.  No signs of infections or ulcers noted.     Onychomycosis  Pain in right toes  Pain in left toes  Consent was obtained for treatment procedures.   Mechanical debridement of nails 1-5  bilaterally performed with a nail nipper.  Filed with dremel without incident.    Return office visit  4 months                    Told patient to return for periodic foot care and evaluation due to potential at risk complications.   Gardiner Barefoot DPM

## 2021-12-02 ENCOUNTER — Ambulatory Visit (INDEPENDENT_AMBULATORY_CARE_PROVIDER_SITE_OTHER): Payer: Medicare (Managed Care) | Admitting: Family Medicine

## 2021-12-02 ENCOUNTER — Other Ambulatory Visit: Payer: Self-pay

## 2021-12-02 VITALS — BP 116/64 | HR 97 | Temp 98.4°F | Wt 218.0 lb

## 2021-12-02 DIAGNOSIS — R300 Dysuria: Secondary | ICD-10-CM

## 2021-12-02 LAB — URINALYSIS, ROUTINE W REFLEX MICROSCOPIC
Bilirubin Urine: NEGATIVE
Glucose, UA: NEGATIVE
Hyaline Cast: NONE SEEN /LPF
Ketones, ur: NEGATIVE
Nitrite: NEGATIVE
Specific Gravity, Urine: 1.015 (ref 1.001–1.035)
pH: 7 (ref 5.0–8.0)

## 2021-12-02 LAB — MICROSCOPIC MESSAGE

## 2021-12-02 MED ORDER — SULFAMETHOXAZOLE-TRIMETHOPRIM 800-160 MG PO TABS: 1.0000 | ORAL_TABLET | Freq: Two times a day (BID) | ORAL | 0 refills | Status: DC

## 2021-12-02 NOTE — Addendum Note (Signed)
Addended by: Colman Cater on: 12/02/2021 12:24 PM   Modules accepted: Orders

## 2021-12-02 NOTE — Progress Notes (Signed)
Subjective:    Patient ID: Donna [name], female    DOB: 10/21/1960, 61 y.o.   MRN: [id number]  Patient presents today with left flank pain.  She has a left-sided nephrostomy tube due to the fact she had hydronephrosis.  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  She does not have kidney stones.  Since the nephrostomy tube has been placed, she has had normal urine output from the nephrostomy tube.  However starting Friday she noticed blood.  She now has left flank pain.  She denies any fevers or chills or nausea or vomiting.  Urinalysis from the nephrostomy tube shows +2 blood, +2 protein, negative nitrates, 1+3 leukocyte esterase.  Urine culture has been ordered.  She had a similar presentation in April that grew Staph aureus. Past Medical History:  Diagnosis Date   Anxiety    Arterial thrombosis (HCC)    right sfa, popliteal artery.  Require thromectomy at [facility] and lifelong anticoagulation.   Cancer (HCC)    ovarian, recurrent on chemo (carboplatin, doxorubicin) at 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[place])    Neuropathy    feet   Rheumatoid arthritis ([place])    at [facility] ring    Wears glasses    Past Surgical History:  Procedure Laterality Date   ABDOMINAL HYSTERECTOMY     ABLATION     COLONOSCOPY     CYST REMOVAL NECK  05/13/12   EAR CYST EXCISION  05/13/2012   Procedure: CYST REMOVAL;  Surgeon: Ralene Ok, MD;  Location: [place];  Service: General;  Laterality: Left;  excision of neck cyst   IR NEPHROSTOMY EXCHANGE LEFT  09/02/2021   THYROID SURGERY  10/2011 - approximate   ablation   TONSILLECTOMY     UPPER GI ENDOSCOPY     Current Outpatient Medications on File Prior to Visit  Medication Sig Dispense Refill   albuterol (VENTOLIN HFA) 108 (90 Base)  MCG/ACT inhaler Inhale 2 puffs into the lungs every 6 (six) hours as needed for wheezing or shortness of breath. 8 g 0   ALPRAZolam (XANAX) 0.5 MG tablet Take 1 tablet (0.5 mg total) by mouth 3 (three) times daily as needed. (Patient taking differently: Take 0.5 mg by mouth 3 (three) times daily as needed for anxiety.) 30 tablet 0   amLODipine (NORVASC) 10 MG tablet Take 1 tablet by mouth once daily (Patient taking differently: Take 10 mg by mouth daily. Atypical angina/hypertension) 90 tablet 3   aspirin EC 81 MG tablet Take 81 mg by mouth daily. Swallow whole.     atorvastatin (LIPITOR) 80 MG tablet Take 1 tablet (80 mg total) by mouth daily. (Patient taking differently: Take 40 mg by mouth daily.) 90 tablet 3   calcium carbonate (OS-CAL - DOSED IN MG OF ELEMENTAL CALCIUM) 1250 (500 Ca) MG tablet Take 1 tablet (500 mg of elemental calcium total) by mouth 3 (three) times daily with meals. (Patient taking differently: Take 1 tablet by mouth daily with breakfast.) 90 tablet 0   Cholecalciferol (VITAMIN D3) 50 MCG (2000 UT) capsule Take 2,000 Units by mouth daily.     gabapentin (NEURONTIN) 300 MG capsule TAKE 1 CAPSULE TWICE A DAY (Patient taking differently: Take 300 mg  by mouth 2 (two) times daily.) 180 capsule 3   leflunomide (ARAVA) 20 MG tablet Take 20 mg by mouth daily.     levothyroxine (SYNTHROID) 175 MCG tablet TAKE 1 TABLET DAILY BEFORE BREAKFAST (Patient taking differently: Take 175 mcg by mouth daily before breakfast.) 90 tablet 3   losartan (COZAAR) 50 MG tablet Take 1 tablet by mouth once daily (Patient taking differently: Take 50 mg by mouth daily.) 90 tablet 3   MAGNESIUM PO Take 800 mg by mouth daily.     omeprazole (PRILOSEC) 40 MG capsule Take 1 capsule (40 mg total) by mouth in the morning and at bedtime. (Patient taking differently: Take 40 mg by mouth daily as needed (acid reflux / GERD).) 60 capsule 3   ondansetron (ZOFRAN) 8 MG tablet Take 8 mg by mouth daily as needed for nausea,  vomiting or refractory nausea / vomiting.     Polyvinyl Alcohol-Povidone (REFRESH OP) Place 1 drop into both eyes See admin instructions. Take 1 drop both eyes on the day before, day of and 7 days after chemotherapy     potassium chloride SA (KLOR-CON M) 20 MEQ tablet Take 2 tablets by mouth once daily 180 tablet 0   PREDNISOLONE ACETATE OP Place 1 drop into both eyes See admin instructions. Take 1 drop both eyes on the day before, day of and 7 days after chemotherapy     vitamin B-12 (CYANOCOBALAMIN) 1000 MCG tablet Take 1,000 mcg by mouth daily.     warfarin (COUMADIN) 5 MG tablet TAKE ONE AND ONE-HALF TABLETS ONCE DAILY ON TUESDAY, THURSDAY, SATURDAY AND SUNDAY, THEN TAKE 2 TABLETS ONCE DAILY ON OTHER DAYS (Patient taking differently: Take 5-10 mg by mouth See admin instructions. Take '5mg'$  by mouth Monday, Wednesday, Friday, then take '10mg'$  all other days) 135 tablet 3   oxyCODONE (OXY IR/ROXICODONE) 5 MG immediate release tablet Take 1 tablet (5 mg total) by mouth 4 (four) times daily as needed for moderate pain. (Patient not taking: Reported on 12/02/2021) 60 tablet 0   predniSONE (DELTASONE) 20 MG tablet 3 tabs poqday 1-2, 2 tabs poqday 3-4, 1 tab poqday 5-6 (Patient not taking: Reported on 12/02/2021) 12 tablet 0   predniSONE (DELTASONE) 5 MG tablet Take 1 tablet (5 mg total) by mouth daily with breakfast. (Patient not taking: Reported on 12/02/2021) 30 tablet 0   No current facility-administered medications on file prior to visit.   Allergies  Allergen Reactions   Paclitaxel Other (See Comments)    Flushing, Itching, Throat tightness   Social History   Socioeconomic History   Marital status: Single    Spouse name: Not on file   Number of children: Not on file   Years of education: Not on file   Highest education level: Not on file  Occupational History   Not on file  Tobacco Use   Smoking status: Former    Years: 32.00    Types: Cigarettes    Quit date: 06/19/2011    Years since  quitting: 10.4   Smokeless tobacco: Never  Vaping Use   Vaping Use: Never used  Substance and Sexual Activity   Alcohol use: No   Drug use: No   Sexual activity: Not on file  Other Topics Concern   Not on file  Social History Narrative   Not on file   Social Determinants of Health   Financial Resource Strain: Low Risk  (10/31/2021)   Overall Financial Resource Strain (CARDIA)    Difficulty of Paying Living  Expenses: Not hard at all  Food Insecurity: No Food Insecurity (10/31/2021)   Hunger Vital Sign    Worried About Running Out of Food in the Last Year: Never true    Ran Out of Food in the Last Year: Never true  Transportation Needs: No Transportation Needs (10/31/2021)   [place] - Hydrologist (Medical): No    Lack of Transportation (Non-Medical): No  Physical Activity: Insufficiently Active (10/31/2021)   Exercise Vital Sign    Days of Exercise per Week: 3 days    Minutes of Exercise per Session: 20 min  Stress: No Stress Concern Present (10/31/2021)   [place]    Feeling of Stress : Only a little  Social Connections: Moderately Integrated (10/31/2021)   Social Connection and Isolation Panel [NHANES]    Frequency of Communication with Friends and Family: More than three times a week    Frequency of Social Gatherings with Friends and Family: Twice a week    Attends Religious Services: More than 4 times per year    Active Member of [employer] or Organizations: Yes    Attends Archivist Meetings: More than 4 times per year    Marital Status: Never married  Intimate Partner Violence: Not At Risk (10/31/2021)   Humiliation, Afraid, Rape, and Kick questionnaire    Fear of Current or Ex-Partner: No    Emotionally Abused: No    Physically Abused: No    Sexually Abused: No      Review of Systems  All other systems reviewed and are negative.      Objective:   Physical  Exam Vitals reviewed.  Constitutional:      Appearance: She is well-developed and normal weight. She is not ill-appearing or toxic-appearing.  Cardiovascular:     Rate and Rhythm: Normal rate and regular rhythm.     Heart sounds: Normal heart sounds.  Pulmonary:     Effort: Pulmonary effort is normal. No accessory muscle usage or respiratory distress.     Breath sounds: No stridor. No wheezing, rhonchi or rales.  Musculoskeletal:     Right lower leg: No edema.     Left lower leg: No edema.  Neurological:     Mental Status: She is alert.           Assessment & Plan:  Dysuria - Plan: Urine Culture Nephrostomy site appears healthy.  There is no evidence of cellulitis or erythema or infection.  I do believe she likely has a kidney infection.  Begin Bactrim double strength tablets twice daily for 7 days to cover against potential staph.  Recheck INR here on Thursday.  Seek medical attention if worsening

## 2021-12-03 LAB — URINE CULTURE
MICRO NUMBER:: 13655320
SPECIMEN QUALITY:: ADEQUATE

## 2021-12-05 ENCOUNTER — Other Ambulatory Visit: Payer: Medicare (Managed Care)

## 2021-12-05 DIAGNOSIS — Z7901 Long term (current) use of anticoagulants: Secondary | ICD-10-CM

## 2021-12-05 DIAGNOSIS — I749 Embolism and thrombosis of unspecified artery: Secondary | ICD-10-CM

## 2021-12-05 LAB — PT WITH INR/FINGERSTICK
INR, fingerstick: 2.3 ratio — ABNORMAL HIGH
PT, fingerstick: 28.1 s — ABNORMAL HIGH (ref 10.5–13.1)

## 2021-12-06 DIAGNOSIS — C786 Secondary malignant neoplasm of retroperitoneum and peritoneum: Secondary | ICD-10-CM | POA: Diagnosis not present

## 2021-12-06 DIAGNOSIS — R110 Nausea: Secondary | ICD-10-CM | POA: Diagnosis not present

## 2021-12-06 DIAGNOSIS — Z5112 Encounter for antineoplastic immunotherapy: Secondary | ICD-10-CM | POA: Diagnosis not present

## 2021-12-06 DIAGNOSIS — E039 Hypothyroidism, unspecified: Secondary | ICD-10-CM | POA: Diagnosis not present

## 2021-12-06 DIAGNOSIS — Z86718 Personal history of other venous thrombosis and embolism: Secondary | ICD-10-CM | POA: Diagnosis not present

## 2021-12-06 DIAGNOSIS — I10 Essential (primary) hypertension: Secondary | ICD-10-CM | POA: Diagnosis not present

## 2021-12-06 DIAGNOSIS — C787 Secondary malignant neoplasm of liver and intrahepatic bile duct: Secondary | ICD-10-CM | POA: Diagnosis not present

## 2021-12-06 DIAGNOSIS — R5383 Other fatigue: Secondary | ICD-10-CM | POA: Diagnosis not present

## 2021-12-06 DIAGNOSIS — Z79620 Long term (current) use of immunosuppressive biologic: Secondary | ICD-10-CM | POA: Diagnosis not present

## 2021-12-06 DIAGNOSIS — Z7901 Long term (current) use of anticoagulants: Secondary | ICD-10-CM | POA: Diagnosis not present

## 2021-12-06 DIAGNOSIS — M059 Rheumatoid arthritis with rheumatoid factor, unspecified: Secondary | ICD-10-CM | POA: Diagnosis not present

## 2021-12-06 DIAGNOSIS — C563 Malignant neoplasm of bilateral ovaries: Secondary | ICD-10-CM | POA: Diagnosis not present

## 2021-12-06 DIAGNOSIS — M879 Osteonecrosis, unspecified: Secondary | ICD-10-CM | POA: Diagnosis not present

## 2021-12-06 DIAGNOSIS — G629 Polyneuropathy, unspecified: Secondary | ICD-10-CM | POA: Diagnosis not present

## 2021-12-06 DIAGNOSIS — Z5111 Encounter for antineoplastic chemotherapy: Secondary | ICD-10-CM | POA: Diagnosis not present

## 2021-12-10 DIAGNOSIS — M85851 Other specified disorders of bone density and structure, right thigh: Secondary | ICD-10-CM | POA: Diagnosis not present

## 2021-12-10 DIAGNOSIS — Z780 Asymptomatic menopausal state: Secondary | ICD-10-CM | POA: Diagnosis not present

## 2021-12-10 DIAGNOSIS — M8589 Other specified disorders of bone density and structure, multiple sites: Secondary | ICD-10-CM | POA: Diagnosis not present

## 2021-12-11 DIAGNOSIS — C563 Malignant neoplasm of bilateral ovaries: Secondary | ICD-10-CM | POA: Diagnosis not present

## 2021-12-11 DIAGNOSIS — Z466 Encounter for fitting and adjustment of urinary device: Secondary | ICD-10-CM | POA: Diagnosis not present

## 2021-12-11 DIAGNOSIS — N1330 Unspecified hydronephrosis: Secondary | ICD-10-CM | POA: Diagnosis not present

## 2021-12-11 DIAGNOSIS — Z436 Encounter for attention to other artificial openings of urinary tract: Secondary | ICD-10-CM | POA: Diagnosis not present

## 2021-12-20 ENCOUNTER — Ambulatory Visit (INDEPENDENT_AMBULATORY_CARE_PROVIDER_SITE_OTHER): Payer: Medicare (Managed Care) | Admitting: Family Medicine

## 2021-12-20 VITALS — BP 146/80 | HR 84 | Temp 97.9°F | Resp 16 | Wt 218.0 lb

## 2021-12-20 DIAGNOSIS — M545 Low back pain, unspecified: Secondary | ICD-10-CM

## 2021-12-20 DIAGNOSIS — R300 Dysuria: Secondary | ICD-10-CM

## 2021-12-20 LAB — URINALYSIS, ROUTINE W REFLEX MICROSCOPIC
Bilirubin Urine: NEGATIVE
Glucose, UA: NEGATIVE
Hyaline Cast: NONE SEEN /LPF
Ketones, ur: NEGATIVE
Nitrite: POSITIVE — AB
Specific Gravity, Urine: 1.015 (ref 1.001–1.035)
pH: 7.5 (ref 5.0–8.0)

## 2021-12-20 LAB — MICROSCOPIC MESSAGE

## 2021-12-20 MED ORDER — CIPROFLOXACIN HCL 500 MG PO TABS: 500.0000 mg | ORAL_TABLET | Freq: Two times a day (BID) | ORAL | 0 refills | Status: AC

## 2021-12-20 NOTE — Progress Notes (Signed)
Subjective:    Patient ID: Donna [name], female    DOB: 1960/06/14, 61 y.o.   MRN: [id number]  Patient presents with left flank pain, subjective fevers, chills, fatigue.  She also reports pelvic discomfort and some dysuria.  Urinalysis today shows +2 blood, positive leukocyte esterase, positive nitrates.  She was unable to provide a urine sample through her urethra.  Therefore this came from the nephrostomy tube. Past Medical History:  Diagnosis Date   Anxiety    Arterial thrombosis (HCC)    right sfa, popliteal artery.  Require thromectomy at [facility] and lifelong anticoagulation.   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[place])    Neuropathy    feet   Rheumatoid arthritis ([place])    at [facility] ring    Wears glasses    Past Surgical History:  Procedure Laterality Date   ABDOMINAL HYSTERECTOMY     ABLATION     COLONOSCOPY     CYST REMOVAL NECK  05/13/12   EAR CYST EXCISION  05/13/2012   Procedure: CYST REMOVAL;  Surgeon: Ralene Ok, MD;  Location: [place];  Service: General;  Laterality: Left;  excision of neck cyst   IR NEPHROSTOMY EXCHANGE LEFT  09/02/2021   THYROID SURGERY  10/2011 - approximate   ablation   TONSILLECTOMY     UPPER GI ENDOSCOPY     Current Outpatient Medications on File Prior to Visit  Medication Sig Dispense Refill   albuterol (VENTOLIN HFA) 108 (90 Base) MCG/ACT inhaler Inhale 2 puffs into the lungs every 6 (six) hours as needed for wheezing or shortness of breath. 8 g 0   ALPRAZolam (XANAX) 0.5 MG tablet Take 1 tablet (0.5 mg total) by mouth 3 (three) times daily as needed. (Patient taking differently: Take 0.5 mg by mouth 3 (three) times daily as needed for anxiety.) 30 tablet 0    amLODipine (NORVASC) 10 MG tablet Take 1 tablet by mouth once daily (Patient taking differently: Take 10 mg by mouth daily. Atypical angina/hypertension) 90 tablet 3   aspirin EC 81 MG tablet Take 81 mg by mouth daily. Swallow whole.     atorvastatin (LIPITOR) 80 MG tablet Take 1 tablet by mouth once daily 90 tablet 0   calcium carbonate (OS-CAL - DOSED IN MG OF ELEMENTAL CALCIUM) 1250 (500 Ca) MG tablet Take 1 tablet (500 mg of elemental calcium total) by mouth 3 (three) times daily with meals. (Patient taking differently: Take 1 tablet by mouth daily with breakfast.) 90 tablet 0   Cholecalciferol (VITAMIN D3) 50 MCG (2000 UT) capsule Take 2,000 Units by mouth daily.     gabapentin (NEURONTIN) 300 MG capsule TAKE 1 CAPSULE TWICE A DAY (Patient taking differently: Take 300 mg by mouth 2 (two) times daily.) 180 capsule 3   leflunomide (ARAVA) 20 MG tablet Take 20 mg by mouth daily.     levothyroxine (SYNTHROID) 175 MCG tablet TAKE 1 TABLET DAILY BEFORE BREAKFAST (Patient taking differently: Take 175 mcg by mouth daily before breakfast.) 90 tablet 3   losartan (COZAAR) 50 MG tablet Take 1 tablet by mouth once daily (Patient taking differently: Take 50 mg by mouth daily.) 90  tablet 3   MAGNESIUM PO Take 800 mg by mouth daily.     omeprazole (PRILOSEC) 40 MG capsule Take 1 capsule (40 mg total) by mouth in the morning and at bedtime. (Patient taking differently: Take 40 mg by mouth daily as needed (acid reflux / GERD).) 60 capsule 3   ondansetron (ZOFRAN) 8 MG tablet Take 8 mg by mouth daily as needed for nausea, vomiting or refractory nausea / vomiting.     oxyCODONE (OXY IR/ROXICODONE) 5 MG immediate release tablet Take 1 tablet (5 mg total) by mouth 4 (four) times daily as needed for moderate pain. (Patient not taking: Reported on 12/02/2021) 60 tablet 0   Polyvinyl Alcohol-Povidone (REFRESH OP) Place 1 drop into both eyes See admin instructions. Take 1 drop both eyes on the day before, day of and 7 days  after chemotherapy     potassium chloride SA (KLOR-CON M) 20 MEQ tablet Take 2 tablets by mouth once daily 180 tablet 0   PREDNISOLONE ACETATE OP Place 1 drop into both eyes See admin instructions. Take 1 drop both eyes on the day before, day of and 7 days after chemotherapy     predniSONE (DELTASONE) 20 MG tablet 3 tabs poqday 1-2, 2 tabs poqday 3-4, 1 tab poqday 5-6 (Patient not taking: Reported on 12/02/2021) 12 tablet 0   predniSONE (DELTASONE) 5 MG tablet Take 1 tablet (5 mg total) by mouth daily with breakfast. (Patient not taking: Reported on 12/02/2021) 30 tablet 0   sulfamethoxazole-trimethoprim (BACTRIM DS) 800-160 MG tablet Take 1 tablet by mouth 2 (two) times daily. 14 tablet 0   vitamin B-12 (CYANOCOBALAMIN) 1000 MCG tablet Take 1,000 mcg by mouth daily.     warfarin (COUMADIN) 5 MG tablet TAKE ONE AND ONE-HALF TABLETS ONCE DAILY ON TUESDAY, THURSDAY, SATURDAY AND SUNDAY, THEN TAKE 2 TABLETS ONCE DAILY ON OTHER DAYS (Patient taking differently: Take 5-10 mg by mouth See admin instructions. Take '5mg'$  by mouth Monday, Wednesday, Friday, then take '10mg'$  all other days) 135 tablet 3   No current facility-administered medications on file prior to visit.   Allergies  Allergen Reactions   Paclitaxel Other (See Comments)    Flushing, Itching, Throat tightness   Social History   Socioeconomic History   Marital status: Single    Spouse name: Not on file   Number of children: Not on file   Years of education: Not on file   Highest education level: Not on file  Occupational History   Not on file  Tobacco Use   Smoking status: Former    Years: 32.00    Types: Cigarettes    Quit date: 06/19/2011    Years since quitting: 10.5   Smokeless tobacco: Never  Vaping Use   Vaping Use: Never used  Substance and Sexual Activity   Alcohol use: No   Drug use: No   Sexual activity: Not on file  Other Topics Concern   Not on file  Social History Narrative   Not on file   Social Determinants  of Health   Financial Resource Strain: Low Risk  (10/31/2021)   Overall Financial Resource Strain (CARDIA)    Difficulty of Paying Living Expenses: Not hard at all  Food Insecurity: No Food Insecurity (10/31/2021)   Hunger Vital Sign    Worried About Running Out of Food in the Last Year: Never true    [place] in the Last Year: Never true  Transportation Needs: No Transportation Needs (10/31/2021)   [place] -  Hydrologist (Medical): No    Lack of Transportation (Non-Medical): No  Physical Activity: Insufficiently Active (10/31/2021)   Exercise Vital Sign    Days of Exercise per Week: 3 days    Minutes of Exercise per Session: 20 min  Stress: No Stress Concern Present (10/31/2021)   [place]    Feeling of Stress : Only a little  Social Connections: Moderately Integrated (10/31/2021)   Social Connection and Isolation Panel [NHANES]    Frequency of Communication with Friends and Family: More than three times a week    Frequency of Social Gatherings with Friends and Family: Twice a week    Attends Religious Services: More than 4 times per year    Active Member of [employer] or Organizations: Yes    Attends Archivist Meetings: More than 4 times per year    Marital Status: Never married  Intimate Partner Violence: Not At Risk (10/31/2021)   Humiliation, Afraid, Rape, and Kick questionnaire    Fear of Current or Ex-Partner: No    Emotionally Abused: No    Physically Abused: No    Sexually Abused: No      Review of Systems  All other systems reviewed and are negative.      Objective:   Physical Exam Vitals reviewed.  Constitutional:      Appearance: She is well-developed and normal weight. She is not ill-appearing or toxic-appearing.  Cardiovascular:     Rate and Rhythm: Normal rate and regular rhythm.     Heart sounds: Normal heart sounds.  Pulmonary:     Effort:  Pulmonary effort is normal. No accessory muscle usage or respiratory distress.     Breath sounds: No stridor. No wheezing, rhonchi or rales.  Musculoskeletal:     Right lower leg: No edema.     Left lower leg: No edema.  Neurological:     Mental Status: She is alert.           Assessment & Plan:  Dysuria  Nephrostomy site appears healthy.  There is no evidence of cellulitis or erythema or infection.  I do believe she likely has a kidney infection.  Start Cipro 500 mg twice daily for 7 days.  recheck INR here on Thursday.  Seek medical attention if worsening

## 2021-12-27 ENCOUNTER — Encounter: Payer: Self-pay | Admitting: Family Medicine

## 2021-12-27 DIAGNOSIS — G629 Polyneuropathy, unspecified: Secondary | ICD-10-CM | POA: Diagnosis not present

## 2021-12-27 DIAGNOSIS — Z5111 Encounter for antineoplastic chemotherapy: Secondary | ICD-10-CM | POA: Diagnosis not present

## 2021-12-27 DIAGNOSIS — Z90722 Acquired absence of ovaries, bilateral: Secondary | ICD-10-CM | POA: Diagnosis not present

## 2021-12-27 DIAGNOSIS — Z79899 Other long term (current) drug therapy: Secondary | ICD-10-CM | POA: Diagnosis not present

## 2021-12-27 DIAGNOSIS — Z90710 Acquired absence of both cervix and uterus: Secondary | ICD-10-CM | POA: Diagnosis not present

## 2021-12-27 DIAGNOSIS — R5383 Other fatigue: Secondary | ICD-10-CM | POA: Diagnosis not present

## 2021-12-27 DIAGNOSIS — R110 Nausea: Secondary | ICD-10-CM | POA: Diagnosis not present

## 2021-12-27 DIAGNOSIS — C787 Secondary malignant neoplasm of liver and intrahepatic bile duct: Secondary | ICD-10-CM | POA: Diagnosis not present

## 2021-12-27 DIAGNOSIS — Z5112 Encounter for antineoplastic immunotherapy: Secondary | ICD-10-CM | POA: Diagnosis not present

## 2021-12-27 DIAGNOSIS — C563 Malignant neoplasm of bilateral ovaries: Secondary | ICD-10-CM | POA: Diagnosis not present

## 2021-12-29 ENCOUNTER — Other Ambulatory Visit: Payer: Self-pay | Admitting: Family Medicine

## 2022-01-06 ENCOUNTER — Other Ambulatory Visit: Payer: Self-pay | Admitting: Family Medicine

## 2022-01-14 DIAGNOSIS — K2289 Other specified disease of esophagus: Secondary | ICD-10-CM | POA: Diagnosis not present

## 2022-01-14 DIAGNOSIS — K668 Other specified disorders of peritoneum: Secondary | ICD-10-CM | POA: Diagnosis not present

## 2022-01-14 DIAGNOSIS — C563 Malignant neoplasm of bilateral ovaries: Secondary | ICD-10-CM | POA: Diagnosis not present

## 2022-01-14 DIAGNOSIS — R918 Other nonspecific abnormal finding of lung field: Secondary | ICD-10-CM | POA: Diagnosis not present

## 2022-01-17 ENCOUNTER — Encounter: Payer: Self-pay | Admitting: Family Medicine

## 2022-01-17 DIAGNOSIS — M059 Rheumatoid arthritis with rheumatoid factor, unspecified: Secondary | ICD-10-CM | POA: Diagnosis not present

## 2022-01-17 DIAGNOSIS — Z8719 Personal history of other diseases of the digestive system: Secondary | ICD-10-CM | POA: Diagnosis not present

## 2022-01-17 DIAGNOSIS — Z86718 Personal history of other venous thrombosis and embolism: Secondary | ICD-10-CM | POA: Diagnosis not present

## 2022-01-17 DIAGNOSIS — Z9079 Acquired absence of other genital organ(s): Secondary | ICD-10-CM | POA: Diagnosis not present

## 2022-01-17 DIAGNOSIS — Z90710 Acquired absence of both cervix and uterus: Secondary | ICD-10-CM | POA: Diagnosis not present

## 2022-01-17 DIAGNOSIS — Z87448 Personal history of other diseases of urinary system: Secondary | ICD-10-CM | POA: Diagnosis not present

## 2022-01-17 DIAGNOSIS — Z936 Other artificial openings of urinary tract status: Secondary | ICD-10-CM | POA: Diagnosis not present

## 2022-01-17 DIAGNOSIS — Z7901 Long term (current) use of anticoagulants: Secondary | ICD-10-CM | POA: Diagnosis not present

## 2022-01-17 DIAGNOSIS — Z5112 Encounter for antineoplastic immunotherapy: Secondary | ICD-10-CM | POA: Diagnosis not present

## 2022-01-17 DIAGNOSIS — Z90722 Acquired absence of ovaries, bilateral: Secondary | ICD-10-CM | POA: Diagnosis not present

## 2022-01-17 DIAGNOSIS — C563 Malignant neoplasm of bilateral ovaries: Secondary | ICD-10-CM | POA: Diagnosis not present

## 2022-01-17 DIAGNOSIS — Z5111 Encounter for antineoplastic chemotherapy: Secondary | ICD-10-CM | POA: Diagnosis not present

## 2022-01-17 DIAGNOSIS — Z79899 Other long term (current) drug therapy: Secondary | ICD-10-CM | POA: Diagnosis not present

## 2022-01-17 DIAGNOSIS — Z8739 Personal history of other diseases of the musculoskeletal system and connective tissue: Secondary | ICD-10-CM | POA: Diagnosis not present

## 2022-01-17 DIAGNOSIS — Z9221 Personal history of antineoplastic chemotherapy: Secondary | ICD-10-CM | POA: Diagnosis not present

## 2022-01-17 DIAGNOSIS — N179 Acute kidney failure, unspecified: Secondary | ICD-10-CM | POA: Diagnosis not present

## 2022-02-07 DIAGNOSIS — C563 Malignant neoplasm of bilateral ovaries: Secondary | ICD-10-CM | POA: Diagnosis not present

## 2022-02-07 DIAGNOSIS — Z5111 Encounter for antineoplastic chemotherapy: Secondary | ICD-10-CM | POA: Diagnosis not present

## 2022-02-12 DIAGNOSIS — Z0000 Encounter for general adult medical examination without abnormal findings: Secondary | ICD-10-CM | POA: Diagnosis not present

## 2022-02-14 ENCOUNTER — Other Ambulatory Visit: Payer: Self-pay | Admitting: Family Medicine

## 2022-02-14 NOTE — Telephone Encounter (Signed)
Requested medications are due for refill today.  yes  Requested medications are on the active medications list.  yes  Last refill. 04/04/2021 #135 3 rf  Future visit scheduled.   no  Notes to clinic.  Refill not delegated.    Requested Prescriptions  Pending Prescriptions Disp Refills   warfarin (COUMADIN) 5 MG tablet [Pharmacy Med Name: WARFARIN TABS '5MG'$ ] 135 tablet 3    Sig: TAKE ONE AND ONE-HALF TABLETS ONCE DAILY ON TUESDAY, THURSDAY, SATURDAY AND SUNDAY, THEN TAKE 2 TABLETS ONCE DAILY ON OTHER DAYS     Hematology:  Anticoagulants - warfarin Failed - 02/14/2022 12:16 AM      Failed - Manual Review: If patient's warfarin is managed by Anti-Coag team, route request to them. If not, route request to the provider.      Failed - INR in normal range and within 30 days    INR, fingerstick  Date Value Ref Range Status  12/05/2021 2.3 (H) ratio Final    Comment:    INRs >2.9 may be falsely elevated in patients receiving either unfractionated Heparin or Low Molecular Weight Heparin. Follow up testing in a hospital laboratory may be helpful if clinically indicated.  . INR results of > or = 5.0 should be verified using the standard venipuncture procedure. Reference Range                     0.9-1.1 Moderate-intensity Warfarin Therapy 2.0-3.0 Higher-intensity Warfarin Therapy   3.0-4.0  .          Failed - Valid encounter within last 3 months    Recent Outpatient Visits           6 months ago Chest pain, unspecified type   [place] Dennard Schaumann Cammie Mcgee, MD   7 months ago Fever, unspecified fever cause   [place] Dennard Schaumann, Cammie Mcgee, MD   8 months ago Arterial thrombosis [facility])   [place] Dennard Schaumann, Cammie Mcgee, MD   12 months ago Arterial thrombosis [facility])   [place] Pickard, Cammie Mcgee, MD   1 year ago Arterial thrombosis [facility])   [facility] Pickard, Cammie Mcgee, MD              Passed -  HCT in normal range and within 360 days    HCT  Date Value Ref Range Status  10/19/2021 37.4 36.0 - 46.0 % Final  06/17/2011 40.8 35.0 - 47.0 % Final         Passed - Patient is not pregnant

## 2022-02-20 ENCOUNTER — Other Ambulatory Visit: Payer: Self-pay

## 2022-02-20 DIAGNOSIS — I749 Embolism and thrombosis of unspecified artery: Secondary | ICD-10-CM

## 2022-02-20 DIAGNOSIS — D689 Coagulation defect, unspecified: Secondary | ICD-10-CM

## 2022-02-20 MED ORDER — WARFARIN SODIUM 5 MG PO TABS: ORAL_TABLET | ORAL | 3 refills | Status: DC

## 2022-02-24 DIAGNOSIS — Z7901 Long term (current) use of anticoagulants: Secondary | ICD-10-CM | POA: Diagnosis not present

## 2022-02-24 DIAGNOSIS — Z79899 Other long term (current) drug therapy: Secondary | ICD-10-CM | POA: Diagnosis not present

## 2022-02-24 DIAGNOSIS — Z7982 Long term (current) use of aspirin: Secondary | ICD-10-CM | POA: Diagnosis not present

## 2022-02-24 DIAGNOSIS — Z90710 Acquired absence of both cervix and uterus: Secondary | ICD-10-CM | POA: Diagnosis not present

## 2022-02-24 DIAGNOSIS — T83512S Infection and inflammatory reaction due to nephrostomy catheter, sequela: Secondary | ICD-10-CM | POA: Diagnosis not present

## 2022-02-24 DIAGNOSIS — Z87891 Personal history of nicotine dependence: Secondary | ICD-10-CM | POA: Diagnosis not present

## 2022-02-24 DIAGNOSIS — N1330 Unspecified hydronephrosis: Secondary | ICD-10-CM | POA: Diagnosis not present

## 2022-02-24 DIAGNOSIS — Z79890 Hormone replacement therapy (postmenopausal): Secondary | ICD-10-CM | POA: Diagnosis not present

## 2022-02-24 DIAGNOSIS — Z90721 Acquired absence of ovaries, unilateral: Secondary | ICD-10-CM | POA: Diagnosis not present

## 2022-02-24 DIAGNOSIS — C786 Secondary malignant neoplasm of retroperitoneum and peritoneum: Secondary | ICD-10-CM | POA: Diagnosis not present

## 2022-02-24 DIAGNOSIS — Z7969 Long term (current) use of other immunomodulators and immunosuppressants: Secondary | ICD-10-CM | POA: Diagnosis not present

## 2022-02-24 DIAGNOSIS — N390 Urinary tract infection, site not specified: Secondary | ICD-10-CM | POA: Diagnosis not present

## 2022-02-24 DIAGNOSIS — C563 Malignant neoplasm of bilateral ovaries: Secondary | ICD-10-CM | POA: Diagnosis not present

## 2022-02-28 DIAGNOSIS — C563 Malignant neoplasm of bilateral ovaries: Secondary | ICD-10-CM | POA: Diagnosis not present

## 2022-02-28 DIAGNOSIS — Z5111 Encounter for antineoplastic chemotherapy: Secondary | ICD-10-CM | POA: Diagnosis not present

## 2022-03-03 ENCOUNTER — Ambulatory Visit (INDEPENDENT_AMBULATORY_CARE_PROVIDER_SITE_OTHER): Payer: Medicare (Managed Care) | Admitting: Podiatry

## 2022-03-03 ENCOUNTER — Encounter: Payer: Self-pay | Admitting: Podiatry

## 2022-03-03 DIAGNOSIS — M79675 Pain in left toe(s): Secondary | ICD-10-CM | POA: Diagnosis not present

## 2022-03-03 DIAGNOSIS — D689 Coagulation defect, unspecified: Secondary | ICD-10-CM | POA: Diagnosis not present

## 2022-03-03 DIAGNOSIS — M79674 Pain in right toe(s): Secondary | ICD-10-CM

## 2022-03-03 DIAGNOSIS — B351 Tinea unguium: Secondary | ICD-10-CM | POA: Diagnosis not present

## 2022-03-03 NOTE — Progress Notes (Signed)
This patient returns to my office for at risk foot care.  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  This patient presents for at risk foot care today.  General Appearance  Alert, conversant and in no acute stress.  Vascular  Dorsalis pedis and posterior tibial  pulses are palpable  bilaterally.  Capillary return is within normal limits  bilaterally. Temperature is within normal limits  bilaterally.  Neurologic  Senn-Weinstein monofilament wire test within normal limits  bilaterally. Muscle power within normal limits bilaterally.  Nails Thick disfigured discolored nails with subungual debris  from hallux to fifth toes bilaterally. No evidence of bacterial infection or drainage bilaterally.  Orthopedic  No limitations of motion  feet .  No crepitus or effusions noted.  No bony pathology or digital deformities noted. Right foot pain.  Skin  normotropic skin with no porokeratosis noted bilaterally.  No signs of infections or ulcers noted.     Onychomycosis  Pain in right toes  Pain in left toes  Consent was obtained for treatment procedures.   Mechanical debridement of nails 1-5  bilaterally performed with a nail nipper.  Filed with dremel without incident.    Return office visit  3  months                    Told patient to return for periodic foot care and evaluation due to potential at risk complications.   Pritesh Sobecki DPM   

## 2022-03-04 ENCOUNTER — Telehealth: Payer: Self-pay

## 2022-03-04 NOTE — Telephone Encounter (Signed)
Pt is starting a new chemo medication, Cytoxan, oral medication. Her Oncologist states she will need to have her INR monitored more frequently. Pt asks when she would need to come in for an INR? Thank you.

## 2022-03-13 ENCOUNTER — Other Ambulatory Visit: Payer: Medicare (Managed Care)

## 2022-03-13 DIAGNOSIS — D696 Thrombocytopenia, unspecified: Secondary | ICD-10-CM

## 2022-03-13 DIAGNOSIS — I749 Embolism and thrombosis of unspecified artery: Secondary | ICD-10-CM

## 2022-03-13 DIAGNOSIS — C569 Malignant neoplasm of unspecified ovary: Secondary | ICD-10-CM

## 2022-03-13 DIAGNOSIS — D689 Coagulation defect, unspecified: Secondary | ICD-10-CM

## 2022-03-13 LAB — PT WITH INR/FINGERSTICK
INR, fingerstick: 2.3 ratio — ABNORMAL HIGH
PT, fingerstick: 27.3 s — ABNORMAL HIGH (ref 10.5–13.1)

## 2022-03-17 DIAGNOSIS — E785 Hyperlipidemia, unspecified: Secondary | ICD-10-CM | POA: Diagnosis not present

## 2022-03-17 DIAGNOSIS — Z79899 Other long term (current) drug therapy: Secondary | ICD-10-CM | POA: Diagnosis not present

## 2022-03-17 DIAGNOSIS — Z79890 Hormone replacement therapy (postmenopausal): Secondary | ICD-10-CM | POA: Diagnosis not present

## 2022-03-17 DIAGNOSIS — I10 Essential (primary) hypertension: Secondary | ICD-10-CM | POA: Diagnosis not present

## 2022-03-17 DIAGNOSIS — N131 Hydronephrosis with ureteral stricture, not elsewhere classified: Secondary | ICD-10-CM | POA: Diagnosis not present

## 2022-03-17 DIAGNOSIS — E039 Hypothyroidism, unspecified: Secondary | ICD-10-CM | POA: Diagnosis not present

## 2022-03-17 DIAGNOSIS — Z7982 Long term (current) use of aspirin: Secondary | ICD-10-CM | POA: Diagnosis not present

## 2022-03-17 DIAGNOSIS — Z466 Encounter for fitting and adjustment of urinary device: Secondary | ICD-10-CM | POA: Diagnosis not present

## 2022-03-17 DIAGNOSIS — N130 Hydronephrosis with ureteropelvic junction obstruction: Secondary | ICD-10-CM | POA: Diagnosis not present

## 2022-03-19 ENCOUNTER — Other Ambulatory Visit: Payer: Self-pay | Admitting: Family Medicine

## 2022-03-19 NOTE — Telephone Encounter (Signed)
Requested Prescriptions  Pending Prescriptions Disp Refills  . atorvastatin (LIPITOR) 80 MG tablet [Pharmacy Med Name: Atorvastatin Calcium 80 MG Oral Tablet] 90 tablet 0    Sig: Take 1 tablet by mouth once daily     Cardiovascular:  Antilipid - Statins Failed - 03/19/2022 10:08 AM      Failed - Lipid Panel in normal range within the last 12 months    Cholesterol, Total  Date Value Ref Range Status  10/02/2016 174 100 - 199 mg/dL Final   Cholesterol  Date Value Ref Range Status  09/02/2019 171 <200 mg/dL Final  06/18/2011 183 0 - 200 mg/dL Final   Ldl Cholesterol, Calc  Date Value Ref Range Status  06/18/2011 128 (H) 0 - 100 mg/dL Final   LDL Cholesterol (Calc)  Date Value Ref Range Status  09/02/2019 108 (H) mg/dL (calc) Final    Comment:    Reference range: <100 . Desirable range <100 mg/dL for primary prevention;   <70 mg/dL for patients with CHD or diabetic patients  with > or = 2 CHD risk factors. Marland Kitchen LDL-C is now calculated using the Martin-Hopkins  calculation, which is a validated novel method providing  better accuracy than the Friedewald equation in the  estimation of LDL-C.  Cresenciano Genre et al. Annamaria Helling. 7681;157(26): 2061-2068  (http://education.[facility].com/faq/FAQ164)    Direct LDL  Date Value Ref Range Status  06/30/2016 113 <130 mg/dL Final    Comment:      Desirable range <100 mg/dL for patients with CHD or diabetes and <70 mg/dL for diabetic patients with known heart disease.      HDL Cholesterol  Date Value Ref Range Status  06/18/2011 28 (L) 40 - 60 mg/dL Final   HDL  Date Value Ref Range Status  09/02/2019 29 (L) > OR = 50 mg/dL Final  10/02/2016 38 (L) >39 mg/dL Final   Triglycerides  Date Value Ref Range Status  09/02/2019 220 (H) <150 mg/dL Final    Comment:    . If a non-fasting specimen was collected, consider repeat triglyceride testing on a fasting specimen if clinically indicated.  Yates Decamp et al. J. of Clin. Lipidol.  2035;5:974-163. Marland Kitchen   06/18/2011 133 0 - 200 mg/dL Final         Passed - Patient is not pregnant      Passed - Valid encounter within last 12 months    Recent Outpatient Visits          7 months ago Chest pain, unspecified type   [place] Susy Frizzle, MD   8 months ago Fever, unspecified fever cause   [place] Dennard Schaumann Cammie Mcgee, MD   9 months ago Arterial thrombosis [facility])   [facility] Pickard, Cammie Mcgee, MD   1 year ago Arterial thrombosis [facility])   [facility] Pick[facility], Cammie Mcgee, MD   1 year ago Arterial thrombosis [facility])   [facility] Pickard, Cammie Mcgee, MD

## 2022-03-20 DIAGNOSIS — M059 Rheumatoid arthritis with rheumatoid factor, unspecified: Secondary | ICD-10-CM | POA: Diagnosis not present

## 2022-03-20 DIAGNOSIS — Z79899 Other long term (current) drug therapy: Secondary | ICD-10-CM | POA: Diagnosis not present

## 2022-03-20 DIAGNOSIS — E559 Vitamin D deficiency, unspecified: Secondary | ICD-10-CM | POA: Diagnosis not present

## 2022-03-28 DIAGNOSIS — C800 Disseminated malignant neoplasm, unspecified: Secondary | ICD-10-CM | POA: Diagnosis not present

## 2022-03-28 DIAGNOSIS — G629 Polyneuropathy, unspecified: Secondary | ICD-10-CM | POA: Diagnosis not present

## 2022-03-28 DIAGNOSIS — C563 Malignant neoplasm of bilateral ovaries: Secondary | ICD-10-CM | POA: Diagnosis not present

## 2022-03-28 DIAGNOSIS — Z5111 Encounter for antineoplastic chemotherapy: Secondary | ICD-10-CM | POA: Diagnosis not present

## 2022-03-28 DIAGNOSIS — E876 Hypokalemia: Secondary | ICD-10-CM | POA: Diagnosis not present

## 2022-03-31 DIAGNOSIS — N135 Crossing vessel and stricture of ureter without hydronephrosis: Secondary | ICD-10-CM | POA: Diagnosis not present

## 2022-03-31 DIAGNOSIS — Z79899 Other long term (current) drug therapy: Secondary | ICD-10-CM | POA: Diagnosis not present

## 2022-03-31 DIAGNOSIS — N1330 Unspecified hydronephrosis: Secondary | ICD-10-CM | POA: Diagnosis not present

## 2022-04-01 DIAGNOSIS — H2513 Age-related nuclear cataract, bilateral: Secondary | ICD-10-CM | POA: Diagnosis not present

## 2022-04-07 ENCOUNTER — Other Ambulatory Visit: Payer: Self-pay | Admitting: Family Medicine

## 2022-04-15 ENCOUNTER — Ambulatory Visit (INDEPENDENT_AMBULATORY_CARE_PROVIDER_SITE_OTHER): Payer: Medicare (Managed Care) | Admitting: Family Medicine

## 2022-04-15 VITALS — Ht 67.0 in | Wt 214.0 lb

## 2022-04-15 DIAGNOSIS — Z7901 Long term (current) use of anticoagulants: Secondary | ICD-10-CM

## 2022-04-15 DIAGNOSIS — I749 Embolism and thrombosis of unspecified artery: Secondary | ICD-10-CM | POA: Diagnosis not present

## 2022-04-15 DIAGNOSIS — D689 Coagulation defect, unspecified: Secondary | ICD-10-CM

## 2022-04-15 LAB — PT WITH INR/FINGERSTICK
INR, fingerstick: 2.4 ratio — ABNORMAL HIGH
PT, fingerstick: 29.3 s — ABNORMAL HIGH (ref 10.5–13.1)

## 2022-04-15 MED ORDER — NEOMYCIN-POLYMYXIN-HC 3.5-10000-1 OT SOLN: 3.0000 [drp] | Freq: Four times a day (QID) | OTIC | 0 refills | Status: DC

## 2022-04-15 NOTE — Progress Notes (Signed)
No ma'am  Subjective:    Patient ID: Donna [name], female    DOB: 1960-12-02, 61 y.o.   MRN: [id number]  Patient has a history of stage IV metastatic ovarian cancer.  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  She is currently taking 5 mg on Monday Wednesday and Friday and 10 mg every other day.  Her INR today is therapeutic at 2.4.  She complains of pain in her right ear.  On examination, there is a cerumen impaction in her right ear.  I was able to remove this manually with alligator forceps.  This revealed otitis externa in the right ear. Past Medical History:  Diagnosis Date   Anxiety    Arterial thrombosis (HCC)    right sfa, popliteal artery.  Require thromectomy at [facility] and lifelong anticoagulation.   Cancer (HCC)    ovarian, recurrent on chemo (carboplatin, doxorubicin) at [facility]   Colon polyp    Depression    Full dentures    GERD (gastroesophageal reflux disease)    HLD (hyperlipidemia) 12/03/2012   Hypertension    Hypothyroidism    IBS (irritable bowel syndrome)    Neck mass    Nephrostomy status ([place])    Neuropathy    feet   Rheumatoid arthritis ([place])    at [facility] ring    Wears glasses    Past Surgical History:  Procedure Laterality Date   ABDOMINAL HYSTERECTOMY     ABLATION     COLONOSCOPY     CYST REMOVAL NECK  05/13/12   EAR CYST EXCISION  05/13/2012   Procedure: CYST REMOVAL;  Surgeon: Ralene Ok, MD;  Location: [place];  Service: General;  Laterality: Left;  excision of neck cyst   IR NEPHROSTOMY EXCHANGE LEFT  09/02/2021   THYROID SURGERY  10/2011 - approximate   ablation   TONSILLECTOMY     UPPER GI ENDOSCOPY     Current Outpatient Medications on File Prior to Visit  Medication Sig Dispense Refill   albuterol (VENTOLIN HFA) 108 (90 Base) MCG/ACT inhaler Inhale 2 puffs into the lungs every 6 (six) hours as needed for wheezing or shortness of breath. 8 g 0   ALPRAZolam (XANAX) 0.5 MG  tablet Take 1 tablet (0.5 mg total) by mouth 3 (three) times daily as needed. (Patient taking differently: Take 0.5 mg by mouth 3 (three) times daily as needed for anxiety.) 30 tablet 0   amLODipine (NORVASC) 10 MG tablet Take 1 tablet by mouth once daily (Patient taking differently: Take 10 mg by mouth daily. Atypical angina/hypertension) 90 tablet 3   aspirin EC 81 MG tablet Take 81 mg by mouth daily. Swallow whole.     atorvastatin (LIPITOR) 80 MG tablet Take 1 tablet by mouth once daily 90 tablet 0   calcium carbonate (OS-CAL - DOSED IN MG OF ELEMENTAL CALCIUM) 1250 (500 Ca) MG tablet Take 1 tablet (500 mg of elemental calcium total) by mouth 3 (three) times daily with meals. (Patient taking differently: Take 1 tablet by mouth daily with breakfast.) 90 tablet 0   Cholecalciferol (VITAMIN D3) 50 MCG (2000 UT) capsule Take 2,000 Units by mouth daily.     cyclophosphamide (CYTOXAN) 50 MG capsule Take '50mg'$  (1 capsule) by mouth daily every morning with a full glass of water.     gabapentin (NEURONTIN) 300 MG capsule TAKE 1 CAPSULE TWICE A DAY (Patient taking differently: Take 300 mg by mouth 2 (  two) times daily.) 180 capsule 3   leflunomide (ARAVA) 20 MG tablet Take 20 mg by mouth daily.     levothyroxine (SYNTHROID) 175 MCG tablet TAKE 1 TABLET DAILY BEFORE BREAKFAST (NEED APPOINTMENT TO CHECK TSH AS SOON AS POSSIBLE) 90 tablet 3   losartan (COZAAR) 50 MG tablet Take 1 tablet by mouth once daily (Patient taking differently: Take 50 mg by mouth daily.) 90 tablet 3   MAGNESIUM PO Take 800 mg by mouth daily.     omeprazole (PRILOSEC) 40 MG capsule Take 1 capsule (40 mg total) by mouth in the morning and at bedtime. (Patient taking differently: Take 40 mg by mouth daily as needed (acid reflux / GERD).) 60 capsule 3   ondansetron (ZOFRAN) 8 MG tablet Take 8 mg by mouth daily as needed for nausea, vomiting or refractory nausea / vomiting.     oxyCODONE (OXY IR/ROXICODONE) 5 MG immediate release tablet Take 1  tablet (5 mg total) by mouth 4 (four) times daily as needed for moderate pain. 60 tablet 0   potassium chloride SA (KLOR-CON M) 20 MEQ tablet TAKE 2  BY MOUTH ONCE DAILY 180 tablet 3   vitamin B-12 (CYANOCOBALAMIN) 1000 MCG tablet Take 1,000 mcg by mouth daily.     warfarin (COUMADIN) 5 MG tablet TAKE ONE AND ONE-HALF TABLETS ONCE DAILY ON TUESDAY, THURSDAY, SATURDAY AND SUNDAY, THEN TAKE 2 TABLETS ONCE DAILY ON OTHER DAYS 135 tablet 3   PREDNISOLONE ACETATE OP Place 1 drop into both eyes See admin instructions. Take 1 drop both eyes on the day before, day of and 7 days after chemotherapy (Patient not taking: Reported on 04/15/2022)     No current facility-administered medications on file prior to visit.   Allergies  Allergen Reactions   Paclitaxel Other (See Comments)    Flushing, Itching, Throat tightness   Social History   Socioeconomic History   Marital status: Single    Spouse name: Not on file   Number of children: Not on file   Years of education: Not on file   Highest education level: Not on file  Occupational History   Not on file  Tobacco Use   Smoking status: Former    Years: 32.00    Types: Cigarettes    Quit date: 06/19/2011    Years since quitting: 10.8   Smokeless tobacco: Never  Vaping Use   Vaping Use: Never used  Substance and Sexual Activity   Alcohol use: No   Drug use: No   Sexual activity: Not on file  Other Topics Concern   Not on file  Social History Narrative   Not on file   Social Determinants of Health   Financial Resource Strain: Low Risk  (10/31/2021)   Overall Financial Resource Strain (CARDIA)    Difficulty of Paying Living Expenses: Not hard at all  Food Insecurity: No Food Insecurity (10/31/2021)   Hunger Vital Sign    Worried About Running Out of Food in the Last Year: Never true    [place] in the Last Year: Never true  Transportation Needs: No Transportation Needs (10/31/2021)   PRAPARE - Radiographer, therapeutic (Medical): No    Lack of Transportation (Non-Medical): No  Physical Activity: Insufficiently Active (10/31/2021)   Exercise Vital Sign    Days of Exercise per Week: 3 days    Minutes of Exercise per Session: 20 min  Stress: No Stress Concern Present (10/31/2021)   [employer]  [employer] - Occupational Stress Questionnaire    Feeling of Stress : Only a little  Social Connections: Moderately Integrated (10/31/2021)   Social Connection and Isolation Panel [NHANES]    Frequency of Communication with Friends and Family: More than three times a week    Frequency of Social Gatherings with Friends and Family: Twice a week    Attends Religious Services: More than 4 times per year    Active Member of [employer] or Organizations: Yes    Attends Archivist Meetings: More than 4 times per year    Marital Status: Never married  Intimate Partner Violence: Not At Risk (10/31/2021)   Humiliation, Afraid, Rape, and Kick questionnaire    Fear of Current or Ex-Partner: No    Emotionally Abused: No    Physically Abused: No    Sexually Abused: No      Review of Systems  All other systems reviewed and are negative.      Objective:   Physical Exam Vitals reviewed.  Constitutional:      Appearance: She is well-developed and normal weight. She is not ill-appearing or toxic-appearing.  HENT:     Right Ear: There is impacted cerumen. Tympanic membrane is not perforated, erythematous, retracted or bulging.  Cardiovascular:     Rate and Rhythm: Normal rate and regular rhythm.     Heart sounds: Normal heart sounds.  Pulmonary:     Effort: Pulmonary effort is normal. Tachypnea present. No accessory muscle usage or respiratory distress.     Breath sounds: No stridor. Examination of the right-lower field reveals decreased breath sounds. Decreased breath sounds present. No wheezing, rhonchi or rales.  Musculoskeletal:     Lumbar back: Tenderness present. Decreased range  of motion.     Right lower leg: No edema.     Left lower leg: No edema.  Neurological:     Mental Status: She is alert.           Assessment & Plan:  Arterial thrombosis (HCC) - Plan: PT with INR/Fingerstick  Blood clotting disorder ([place]) - Plan: PT with INR/Fingerstick  Anticoagulant long-term use - Plan: PT with INR/Fingerstick Patient is INR is therapeutic, continue Coumadin at his current dose.  I removed the cerumen impaction with alligator forceps.  Treat otitis externa with Cortisporin HC otic 2 to 3 drops 4 times daily until better.  Recommended mineral oil daily to help prevent cerumen impaction in the future

## 2022-04-21 DIAGNOSIS — Z436 Encounter for attention to other artificial openings of urinary tract: Secondary | ICD-10-CM | POA: Diagnosis not present

## 2022-04-21 DIAGNOSIS — Z466 Encounter for fitting and adjustment of urinary device: Secondary | ICD-10-CM | POA: Diagnosis not present

## 2022-04-21 DIAGNOSIS — N135 Crossing vessel and stricture of ureter without hydronephrosis: Secondary | ICD-10-CM | POA: Diagnosis not present

## 2022-04-25 DIAGNOSIS — Z5111 Encounter for antineoplastic chemotherapy: Secondary | ICD-10-CM | POA: Diagnosis not present

## 2022-04-25 DIAGNOSIS — C563 Malignant neoplasm of bilateral ovaries: Secondary | ICD-10-CM | POA: Diagnosis not present

## 2022-04-28 DIAGNOSIS — N1339 Other hydronephrosis: Secondary | ICD-10-CM | POA: Diagnosis not present

## 2022-05-21 DIAGNOSIS — H25043 Posterior subcapsular polar age-related cataract, bilateral: Secondary | ICD-10-CM | POA: Diagnosis not present

## 2022-05-21 DIAGNOSIS — H2513 Age-related nuclear cataract, bilateral: Secondary | ICD-10-CM | POA: Diagnosis not present

## 2022-05-21 DIAGNOSIS — I10 Essential (primary) hypertension: Secondary | ICD-10-CM | POA: Diagnosis not present

## 2022-05-21 DIAGNOSIS — H04123 Dry eye syndrome of bilateral lacrimal glands: Secondary | ICD-10-CM | POA: Diagnosis not present

## 2022-05-21 DIAGNOSIS — H2511 Age-related nuclear cataract, right eye: Secondary | ICD-10-CM | POA: Diagnosis not present

## 2022-05-26 DIAGNOSIS — E039 Hypothyroidism, unspecified: Secondary | ICD-10-CM | POA: Diagnosis not present

## 2022-05-26 DIAGNOSIS — I10 Essential (primary) hypertension: Secondary | ICD-10-CM | POA: Diagnosis not present

## 2022-05-26 DIAGNOSIS — I749 Embolism and thrombosis of unspecified artery: Secondary | ICD-10-CM | POA: Diagnosis not present

## 2022-05-26 DIAGNOSIS — C800 Disseminated malignant neoplasm, unspecified: Secondary | ICD-10-CM | POA: Diagnosis not present

## 2022-05-26 DIAGNOSIS — K222 Esophageal obstruction: Secondary | ICD-10-CM | POA: Diagnosis not present

## 2022-05-26 DIAGNOSIS — M059 Rheumatoid arthritis with rheumatoid factor, unspecified: Secondary | ICD-10-CM | POA: Diagnosis not present

## 2022-05-26 DIAGNOSIS — R911 Solitary pulmonary nodule: Secondary | ICD-10-CM | POA: Diagnosis not present

## 2022-05-26 DIAGNOSIS — M940 Chondrocostal junction syndrome [Tietze]: Secondary | ICD-10-CM | POA: Diagnosis not present

## 2022-05-26 DIAGNOSIS — Z01818 Encounter for other preprocedural examination: Secondary | ICD-10-CM | POA: Diagnosis not present

## 2022-06-03 ENCOUNTER — Ambulatory Visit: Payer: Medicare (Managed Care) | Admitting: Podiatry

## 2022-06-03 DIAGNOSIS — M8580 Other specified disorders of bone density and structure, unspecified site: Secondary | ICD-10-CM | POA: Diagnosis not present

## 2022-06-03 DIAGNOSIS — M87871 Other osteonecrosis, right ankle: Secondary | ICD-10-CM | POA: Diagnosis not present

## 2022-06-03 DIAGNOSIS — G629 Polyneuropathy, unspecified: Secondary | ICD-10-CM | POA: Diagnosis not present

## 2022-06-03 DIAGNOSIS — E039 Hypothyroidism, unspecified: Secondary | ICD-10-CM | POA: Diagnosis not present

## 2022-06-03 DIAGNOSIS — N1330 Unspecified hydronephrosis: Secondary | ICD-10-CM | POA: Diagnosis not present

## 2022-06-03 DIAGNOSIS — Z6833 Body mass index (BMI) 33.0-33.9, adult: Secondary | ICD-10-CM | POA: Diagnosis not present

## 2022-06-03 DIAGNOSIS — E669 Obesity, unspecified: Secondary | ICD-10-CM | POA: Diagnosis not present

## 2022-06-03 DIAGNOSIS — M069 Rheumatoid arthritis, unspecified: Secondary | ICD-10-CM | POA: Diagnosis not present

## 2022-06-03 DIAGNOSIS — K219 Gastro-esophageal reflux disease without esophagitis: Secondary | ICD-10-CM | POA: Diagnosis not present

## 2022-06-03 DIAGNOSIS — Z7901 Long term (current) use of anticoagulants: Secondary | ICD-10-CM | POA: Diagnosis not present

## 2022-06-03 DIAGNOSIS — Z9221 Personal history of antineoplastic chemotherapy: Secondary | ICD-10-CM | POA: Diagnosis not present

## 2022-06-03 DIAGNOSIS — Z87891 Personal history of nicotine dependence: Secondary | ICD-10-CM | POA: Diagnosis not present

## 2022-06-03 DIAGNOSIS — G8929 Other chronic pain: Secondary | ICD-10-CM | POA: Diagnosis not present

## 2022-06-03 DIAGNOSIS — I748 Embolism and thrombosis of other arteries: Secondary | ICD-10-CM | POA: Diagnosis not present

## 2022-06-03 DIAGNOSIS — Z436 Encounter for attention to other artificial openings of urinary tract: Secondary | ICD-10-CM | POA: Diagnosis not present

## 2022-06-06 DIAGNOSIS — C563 Malignant neoplasm of bilateral ovaries: Secondary | ICD-10-CM | POA: Diagnosis not present

## 2022-06-06 DIAGNOSIS — C786 Secondary malignant neoplasm of retroperitoneum and peritoneum: Secondary | ICD-10-CM | POA: Diagnosis not present

## 2022-06-06 DIAGNOSIS — N898 Other specified noninflammatory disorders of vagina: Secondary | ICD-10-CM | POA: Diagnosis not present

## 2022-06-15 ENCOUNTER — Other Ambulatory Visit: Payer: Self-pay | Admitting: Family Medicine

## 2022-06-16 NOTE — Telephone Encounter (Signed)
Requested medication (s) are due for refill today - yes  Requested medication (s) are on the active medication list -yes  Future visit scheduled -no  Last refill: 03/19/22 #90  Notes to clinic: OV 04/15/22, fails lab protocol- over 1 year 06/18/2019  Requested Prescriptions  Pending Prescriptions Disp Refills   atorvastatin (LIPITOR) 80 MG tablet [Pharmacy Med Name: Atorvastatin Calcium 80 MG Oral Tablet] 90 tablet 0    Sig: Take 1 tablet by mouth once daily     Cardiovascular:  Antilipid - Statins Failed - 06/15/2022  7:20 AM      Failed - Lipid Panel in normal range within the last 12 months    Cholesterol, Total  Date Value Ref Range Status  10/02/2016 174 100 - 199 mg/dL Final   Cholesterol  Date Value Ref Range Status  09/02/2019 171 <200 mg/dL Final  06/18/2011 183 0 - 200 mg/dL Final   Ldl Cholesterol, Calc  Date Value Ref Range Status  06/18/2011 128 (H) 0 - 100 mg/dL Final   LDL Cholesterol (Calc)  Date Value Ref Range Status  09/02/2019 108 (H) mg/dL (calc) Final    Comment:    Reference range: <100 . Desirable range <100 mg/dL for primary prevention;   <70 mg/dL for patients with CHD or diabetic patients  with > or = 2 CHD risk factors. Marland Kitchen LDL-C is now calculated using the Martin-Hopkins  calculation, which is a validated novel method providing  better accuracy than the Friedewald equation in the  estimation of LDL-C.  Cresenciano Genre et al. Annamaria Helling. 0459;977(41): 2061-2068  (http://education.[facility].com/faq/FAQ164)    Direct LDL  Date Value Ref Range Status  06/30/2016 113 <130 mg/dL Final    Comment:      Desirable range <100 mg/dL for patients with CHD or diabetes and <70 mg/dL for diabetic patients with known heart disease.      HDL Cholesterol  Date Value Ref Range Status  06/18/2011 28 (L) 40 - 60 mg/dL Final   HDL  Date Value Ref Range Status  09/02/2019 29 (L) > OR = 50 mg/dL Final  10/02/2016 38 (L) >39 mg/dL Final   Triglycerides   Date Value Ref Range Status  09/02/2019 220 (H) <150 mg/dL Final    Comment:    . If a non-fasting specimen was collected, consider repeat triglyceride testing on a fasting specimen if clinically indicated.  Yates Decamp et al. J. of Clin. Lipidol. 4239;5:320-233. Marland Kitchen   06/18/2011 133 0 - 200 mg/dL Final         Passed - Patient is not pregnant      Passed - Valid encounter within last 12 months    Recent Outpatient Visits           10 months ago Chest pain, unspecified type   [place] Susy Frizzle, MD   11 months ago Fever, unspecified fever cause   [place] Pickard, Cammie Mcgee, MD   1 year ago Arterial thrombosis [facility])   [place] [facility] Pickard, Cammie Mcgee, MD   1 year ago Arterial thrombosis [facility])   [facility] Pickard, Cammie Mcgee, MD   1 year ago Arterial thrombosis [facility])   [place] Pickard, Cammie Mcgee, MD                 Requested Prescriptions  Pending Prescriptions Disp Refills   atorvastatin (LIPITOR) 80 MG tablet [Pharmacy Med Name: Atorvastatin Calcium 80 MG Oral Tablet] 90  tablet 0    Sig: Take 1 tablet by mouth once daily     Cardiovascular:  Antilipid - Statins Failed - 06/15/2022  7:20 AM      Failed - Lipid Panel in normal range within the last 12 months    Cholesterol, Total  Date Value Ref Range Status  10/02/2016 174 100 - 199 mg/dL Final   Cholesterol  Date Value Ref Range Status  09/02/2019 171 <200 mg/dL Final  06/18/2011 183 0 - 200 mg/dL Final   Ldl Cholesterol, Calc  Date Value Ref Range Status  06/18/2011 128 (H) 0 - 100 mg/dL Final   LDL Cholesterol (Calc)  Date Value Ref Range Status  09/02/2019 108 (H) mg/dL (calc) Final    Comment:    Reference range: <100 . Desirable range <100 mg/dL for primary prevention;   <70 mg/dL for patients with CHD or diabetic patients  with > or = 2 CHD risk factors. Marland Kitchen LDL-C is now calculated using the  Martin-Hopkins  calculation, which is a validated novel method providing  better accuracy than the Friedewald equation in the  estimation of LDL-C.  Cresenciano Genre et al. Annamaria Helling. 6203;559(74): 2061-2068  (http://education.[facility].com/faq/FAQ164)    Direct LDL  Date Value Ref Range Status  06/30/2016 113 <130 mg/dL Final    Comment:      Desirable range <100 mg/dL for patients with CHD or diabetes and <70 mg/dL for diabetic patients with known heart disease.      HDL Cholesterol  Date Value Ref Range Status  06/18/2011 28 (L) 40 - 60 mg/dL Final   HDL  Date Value Ref Range Status  09/02/2019 29 (L) > OR = 50 mg/dL Final  10/02/2016 38 (L) >39 mg/dL Final   Triglycerides  Date Value Ref Range Status  09/02/2019 220 (H) <150 mg/dL Final    Comment:    . If a non-fasting specimen was collected, consider repeat triglyceride testing on a fasting specimen if clinically indicated.  Yates Decamp et al. J. of Clin. Lipidol. 1638;4:536-468. Marland Kitchen   06/18/2011 133 0 - 200 mg/dL Final         Passed - Patient is not pregnant      Passed - Valid encounter within last 12 months    Recent Outpatient Visits           10 months ago Chest pain, unspecified type   [place] Susy Frizzle, MD   11 months ago Fever, unspecified fever cause   [place] Pickard, Cammie Mcgee, MD   1 year ago Arterial thrombosis [facility])   [facility] Pickard, Cammie Mcgee, MD   1 year ago Arterial thrombosis [facility])   [facility] Pickard, Cammie Mcgee, MD   1 year ago Arterial thrombosis [facility])   [facility] Pickard, Cammie Mcgee, MD

## 2022-06-27 DIAGNOSIS — Z79634 Long term (current) use of topoisomerase inhibitor: Secondary | ICD-10-CM | POA: Diagnosis not present

## 2022-06-27 DIAGNOSIS — Z5111 Encounter for antineoplastic chemotherapy: Secondary | ICD-10-CM | POA: Diagnosis not present

## 2022-06-27 DIAGNOSIS — C563 Malignant neoplasm of bilateral ovaries: Secondary | ICD-10-CM | POA: Diagnosis not present

## 2022-07-02 ENCOUNTER — Encounter: Payer: Self-pay | Admitting: Podiatry

## 2022-07-02 ENCOUNTER — Ambulatory Visit (INDEPENDENT_AMBULATORY_CARE_PROVIDER_SITE_OTHER): Payer: Medicare (Managed Care) | Admitting: Podiatry

## 2022-07-02 DIAGNOSIS — M79674 Pain in right toe(s): Secondary | ICD-10-CM | POA: Diagnosis not present

## 2022-07-02 DIAGNOSIS — B351 Tinea unguium: Secondary | ICD-10-CM

## 2022-07-02 DIAGNOSIS — M79675 Pain in left toe(s): Secondary | ICD-10-CM

## 2022-07-02 DIAGNOSIS — D689 Coagulation defect, unspecified: Secondary | ICD-10-CM

## 2022-07-02 NOTE — Progress Notes (Signed)
This patient returns to my office for at risk foot care.  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  This patient presents for at risk foot care today.  General Appearance  Alert, conversant and in no acute stress.  Vascular  Dorsalis pedis and posterior tibial  pulses are palpable  bilaterally.  Capillary return is within normal limits  bilaterally. Temperature is within normal limits  bilaterally.  Neurologic  Senn-Weinstein monofilament wire test within normal limits  bilaterally. Muscle power within normal limits bilaterally.  Nails Thick disfigured discolored nails with subungual debris  from hallux to fifth toes bilaterally. No evidence of bacterial infection or drainage bilaterally.  Orthopedic  No limitations of motion  feet .  No crepitus or effusions noted.  No bony pathology or digital deformities noted. Right foot pain.  Skin  normotropic skin with no porokeratosis noted bilaterally.  No signs of infections or ulcers noted.     Onychomycosis  Pain in right toes  Pain in left toes  Consent was obtained for treatment procedures.   Mechanical debridement of nails 1-5  bilaterally performed with a nail nipper.  Filed with dremel without incident.    Return office visit  3  months                    Told patient to return for periodic foot care and evaluation due to potential at risk complications.   Gardiner Barefoot DPM

## 2022-07-04 DIAGNOSIS — Z5111 Encounter for antineoplastic chemotherapy: Secondary | ICD-10-CM | POA: Diagnosis not present

## 2022-07-04 DIAGNOSIS — R188 Other ascites: Secondary | ICD-10-CM | POA: Diagnosis not present

## 2022-07-04 DIAGNOSIS — Z79899 Other long term (current) drug therapy: Secondary | ICD-10-CM | POA: Diagnosis not present

## 2022-07-04 DIAGNOSIS — I878 Other specified disorders of veins: Secondary | ICD-10-CM | POA: Diagnosis not present

## 2022-07-04 DIAGNOSIS — C563 Malignant neoplasm of bilateral ovaries: Secondary | ICD-10-CM | POA: Diagnosis not present

## 2022-07-04 DIAGNOSIS — Z5181 Encounter for therapeutic drug level monitoring: Secondary | ICD-10-CM | POA: Diagnosis not present

## 2022-07-11 DIAGNOSIS — C563 Malignant neoplasm of bilateral ovaries: Secondary | ICD-10-CM | POA: Diagnosis not present

## 2022-07-16 DIAGNOSIS — Z01818 Encounter for other preprocedural examination: Secondary | ICD-10-CM | POA: Diagnosis not present

## 2022-07-16 DIAGNOSIS — R180 Malignant ascites: Secondary | ICD-10-CM | POA: Diagnosis not present

## 2022-07-16 DIAGNOSIS — C563 Malignant neoplasm of bilateral ovaries: Secondary | ICD-10-CM | POA: Diagnosis not present

## 2022-07-16 DIAGNOSIS — R188 Other ascites: Secondary | ICD-10-CM | POA: Diagnosis not present

## 2022-07-16 DIAGNOSIS — Z01812 Encounter for preprocedural laboratory examination: Secondary | ICD-10-CM | POA: Diagnosis not present

## 2022-07-25 DIAGNOSIS — Z5111 Encounter for antineoplastic chemotherapy: Secondary | ICD-10-CM | POA: Diagnosis not present

## 2022-07-25 DIAGNOSIS — C563 Malignant neoplasm of bilateral ovaries: Secondary | ICD-10-CM | POA: Diagnosis not present

## 2022-07-31 DIAGNOSIS — Z7982 Long term (current) use of aspirin: Secondary | ICD-10-CM | POA: Diagnosis not present

## 2022-07-31 DIAGNOSIS — C563 Malignant neoplasm of bilateral ovaries: Secondary | ICD-10-CM | POA: Diagnosis not present

## 2022-07-31 DIAGNOSIS — R180 Malignant ascites: Secondary | ICD-10-CM | POA: Diagnosis not present

## 2022-07-31 DIAGNOSIS — Z79890 Hormone replacement therapy (postmenopausal): Secondary | ICD-10-CM | POA: Diagnosis not present

## 2022-07-31 DIAGNOSIS — Z79899 Other long term (current) drug therapy: Secondary | ICD-10-CM | POA: Diagnosis not present

## 2022-08-01 DIAGNOSIS — C563 Malignant neoplasm of bilateral ovaries: Secondary | ICD-10-CM | POA: Diagnosis not present

## 2022-08-06 DIAGNOSIS — C563 Malignant neoplasm of bilateral ovaries: Secondary | ICD-10-CM | POA: Diagnosis not present

## 2022-08-06 DIAGNOSIS — R180 Malignant ascites: Secondary | ICD-10-CM | POA: Diagnosis not present

## 2022-08-07 ENCOUNTER — Other Ambulatory Visit: Payer: Self-pay | Admitting: Family Medicine

## 2022-08-08 DIAGNOSIS — Z5111 Encounter for antineoplastic chemotherapy: Secondary | ICD-10-CM | POA: Diagnosis not present

## 2022-08-08 DIAGNOSIS — C563 Malignant neoplasm of bilateral ovaries: Secondary | ICD-10-CM | POA: Diagnosis not present

## 2022-08-09 ENCOUNTER — Other Ambulatory Visit: Payer: Self-pay | Admitting: Family Medicine

## 2022-08-11 NOTE — Telephone Encounter (Signed)
Requested Prescriptions  Pending Prescriptions Disp Refills   losartan (COZAAR) 50 MG tablet [Pharmacy Med Name: Losartan Potassium 50 MG Oral Tablet] 90 tablet 0    Sig: Take 1 tablet by mouth once daily     Cardiovascular:  Angiotensin Receptor Blockers Failed - 08/09/2022 11:36 AM      Failed - Cr in normal range and within 180 days    Creat  Date Value Ref Range Status  09/24/2020 0.59 0.50 - 0.99 mg/dL Final    Comment:    For patients >63 years of age, the reference limit for Creatinine is approximately 13% higher for people identified as African-American. .    Creatinine, Ser  Date Value Ref Range Status  10/19/2021 0.99 0.44 - 1.00 mg/dL Final         Failed - K in normal range and within 180 days    Potassium  Date Value Ref Range Status  10/19/2021 2.9 (L) 3.5 - 5.1 mmol/L Final  06/17/2011 3.1 (L) 3.5 - 5.1 mmol/L Final         Failed - Last BP in normal range    BP Readings from Last 1 Encounters:  12/20/21 (!) 146/80         Failed - Valid encounter within last 6 months    Recent Outpatient Visits           1 year ago Chest pain, unspecified type   [place] Susy Frizzle, MD   1 year ago Fever, unspecified fever cause   [place] Pickard, Cammie Mcgee, MD   1 year ago Arterial thrombosis ([place])   [place] Pickard, Cammie Mcgee, MD   1 year ago Arterial thrombosis ([place])   [place] Susy Frizzle, MD   1 year ago Arterial thrombosis ([place])   [place] Pickard, Cammie Mcgee, MD              Passed - Patient is not pregnant       amLODipine (NORVASC) 10 MG tablet [Pharmacy Med Name: amLODIPine Besylate 10 MG Oral Tablet] 90 tablet 0    Sig: Take 1 tablet by mouth once daily     Cardiovascular: Calcium Channel Blockers 2 Failed - 08/09/2022 11:36 AM      Failed - Last BP in normal range    BP Readings from Last 1 Encounters:  12/20/21 (!) 146/80          Failed - Valid encounter within last 6 months    Recent Outpatient Visits           1 year ago Chest pain, unspecified type   [place] Susy Frizzle, MD   1 year ago Fever, unspecified fever cause   [place], Cammie Mcgee, MD   1 year ago Arterial thrombosis [facility])   [place] Pickard, Cammie Mcgee, MD   1 year ago Arterial thrombosis [facility])   [facility] Pickard, Cammie Mcgee, MD   1 year ago Arterial thrombosis [facility])   [place], Cammie Mcgee, MD              Passed - Last Heart Rate in normal range    Pulse Readings from Last 1 Encounters:  12/20/21 84

## 2022-08-12 ENCOUNTER — Other Ambulatory Visit: Payer: Self-pay | Admitting: Family Medicine

## 2022-08-12 ENCOUNTER — Telehealth: Payer: Self-pay

## 2022-08-12 MED ORDER — CEPHALEXIN 500 MG PO CAPS: 500.0000 mg | ORAL_CAPSULE | Freq: Three times a day (TID) | ORAL | 0 refills | Status: DC

## 2022-08-12 NOTE — Telephone Encounter (Signed)
Pt came by office. Pt had a drain placed 2 weeks ago for issues with ascites. Pt states she has had increased tenderness and redness around drain site since Friday. Pt asks if an antibiotic could be sent in for her. Thank you.

## 2022-08-21 DIAGNOSIS — C563 Malignant neoplasm of bilateral ovaries: Secondary | ICD-10-CM | POA: Diagnosis not present

## 2022-08-22 DIAGNOSIS — D508 Other iron deficiency anemias: Secondary | ICD-10-CM | POA: Diagnosis not present

## 2022-08-22 DIAGNOSIS — C563 Malignant neoplasm of bilateral ovaries: Secondary | ICD-10-CM | POA: Diagnosis not present

## 2022-08-22 DIAGNOSIS — Z5111 Encounter for antineoplastic chemotherapy: Secondary | ICD-10-CM | POA: Diagnosis not present

## 2022-08-29 DIAGNOSIS — R180 Malignant ascites: Secondary | ICD-10-CM | POA: Diagnosis not present

## 2022-08-29 DIAGNOSIS — C563 Malignant neoplasm of bilateral ovaries: Secondary | ICD-10-CM | POA: Diagnosis not present

## 2022-09-08 ENCOUNTER — Other Ambulatory Visit: Payer: Medicare (Managed Care)

## 2022-09-08 DIAGNOSIS — N1330 Unspecified hydronephrosis: Secondary | ICD-10-CM | POA: Diagnosis not present

## 2022-09-09 ENCOUNTER — Other Ambulatory Visit: Payer: Medicare (Managed Care)

## 2022-09-09 DIAGNOSIS — I749 Embolism and thrombosis of unspecified artery: Secondary | ICD-10-CM

## 2022-09-09 DIAGNOSIS — Z7901 Long term (current) use of anticoagulants: Secondary | ICD-10-CM

## 2022-09-09 DIAGNOSIS — D689 Coagulation defect, unspecified: Secondary | ICD-10-CM

## 2022-09-09 DIAGNOSIS — R300 Dysuria: Secondary | ICD-10-CM | POA: Diagnosis not present

## 2022-09-09 LAB — PT WITH INR/FINGERSTICK
INR, fingerstick: 1.8 ratio — ABNORMAL HIGH
PT, fingerstick: 21.5 s — ABNORMAL HIGH (ref 10.5–13.1)

## 2022-09-10 LAB — URINE CULTURE
MICRO NUMBER:: 14862229
Result:: NO GROWTH
SPECIMEN QUALITY:: ADEQUATE

## 2022-09-12 ENCOUNTER — Other Ambulatory Visit: Payer: Self-pay | Admitting: Family Medicine

## 2022-09-12 NOTE — Telephone Encounter (Signed)
Requested Prescriptions  Pending Prescriptions Disp Refills   atorvastatin (LIPITOR) 80 MG tablet [Pharmacy Med Name: Atorvastatin Calcium 80 MG Oral Tablet] 90 tablet 0    Sig: TAKE 1 TABLET BY MOUTH ONCE DAILY ---PT  NEED  CPE  APPT  W/PCP  FOR  FURTHER  REFILLS     Cardiovascular:  Antilipid - Statins Failed - 09/12/2022  9:35 AM      Failed - Valid encounter within last 12 months    Recent Outpatient Visits           1 year ago Chest pain, unspecified type   [facility] Donita Brooks, MD   1 year ago Fever, unspecified fever cause   [facility] Pickard, Priscille Heidelberg, MD   1 year ago Arterial thrombosis [facility])   [facility] Pickard, Priscille Heidelberg, MD   1 year ago Arterial thrombosis [facility])   [facility] Pickard, Priscille Heidelberg, MD   1 year ago Arterial thrombosis (HCC)   [facility] Pickard, Priscille Heidelberg, MD              Failed - Lipid Panel in normal range within the last 12 months    Cholesterol, Total  Date Value Ref Range Status  10/02/2016 174 100 - 199 mg/dL Final   Cholesterol  Date Value Ref Range Status  09/02/2019 171 <200 mg/dL Final  13/12/6576 469 0 - 200 mg/dL Final   Ldl Cholesterol, Calc  Date Value Ref Range Status  06/18/2011 128 (H) 0 - 100 mg/dL Final   LDL Cholesterol (Calc)  Date Value Ref Range Status  09/02/2019 108 (H) mg/dL (calc) Final    Comment:    Reference range: <100 . Desirable range <100 mg/dL for primary prevention;   <70 mg/dL for patients with CHD or diabetic patients  with > or = 2 CHD risk factors. Marland Kitchen LDL-C is now calculated using the Martin-Hopkins  calculation, which is a validated novel method providing  better accuracy than the Friedewald equation in the  estimation of LDL-C.  Horald Pollen et al. Lenox Ahr. 6295;284(13): 2061-2068  (http://education.[facility].com/faq/FAQ164)    Direct LDL  Date Value Ref Range Status  06/30/2016 113 <130 mg/dL  Final    Comment:      Desirable range <100 mg/dL for patients with CHD or diabetes and <70 mg/dL for diabetic patients with known heart disease.      HDL Cholesterol  Date Value Ref Range Status  06/18/2011 28 (L) 40 - 60 mg/dL Final   HDL  Date Value Ref Range Status  09/02/2019 29 (L) > OR = 50 mg/dL Final  24/40/1027 38 (L) >39 mg/dL Final   Triglycerides  Date Value Ref Range Status  09/02/2019 220 (H) <150 mg/dL Final    Comment:    . If a non-fasting specimen was collected, consider repeat triglyceride testing on a fasting specimen if clinically indicated.  Perry Mount et al. J. of Clin. Lipidol. 2015;9:129-169. Marland Kitchen   06/18/2011 133 0 - 200 mg/dL Final         Passed - Patient is not pregnant

## 2022-09-13 IMAGING — CT CT ANGIO CHEST
2 of 6 series · 18 of 46 positions shown · IV contrast (agent unspecified)
Comparison: Chest radiograph of earlier today.  08/01/2021 CT

CLINICAL DATA: Shortness of breath. Left flank pain. Bacterial
pneumonia.

EXAM:
CT ANGIOGRAPHY CHEST WITH CONTRAST
TECHNIQUE: Multidetector CT imaging of the chest was performed using the
standard protocol during bolus administration of intravenous
contrast. Multiplanar CT image reconstructions and MIPs were
obtained to evaluate the vascular anatomy.

[Series 5: thins · axial · 0.71mm/px · z∈[+1522,+1789]mm · 15 of 297 slices shown]
[im 15/297  lung]
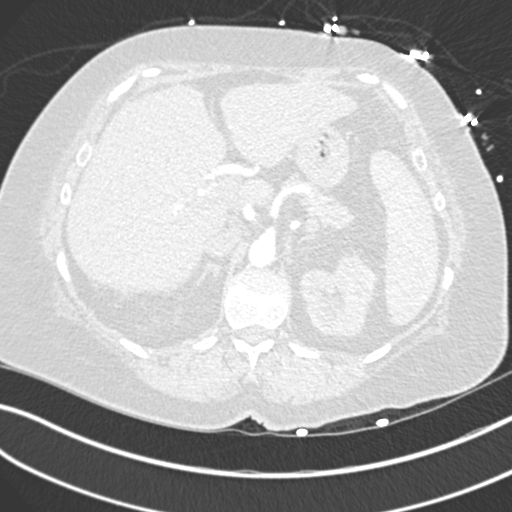
[im 30/297  soft-tissue]
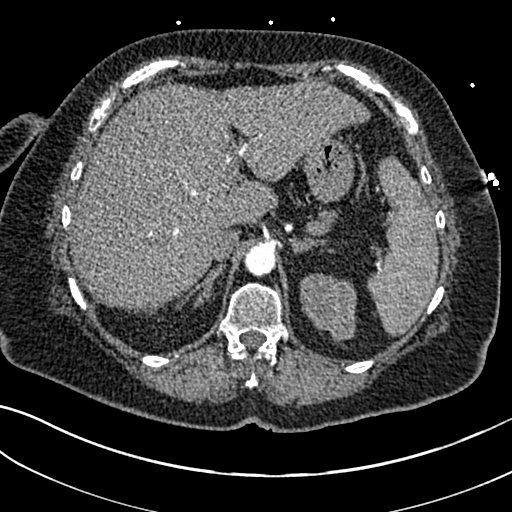
[im 60/297  lung]
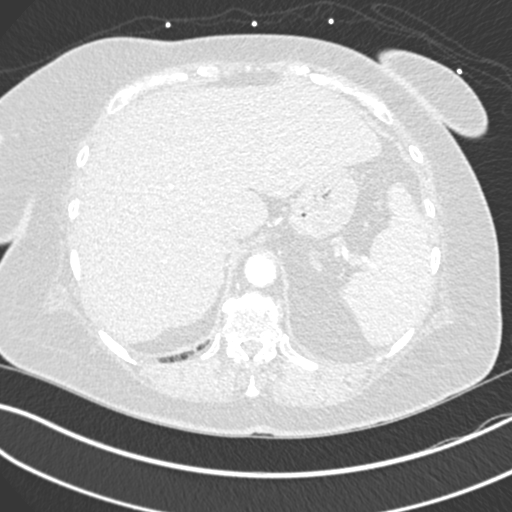
[im 75/297  soft-tissue]
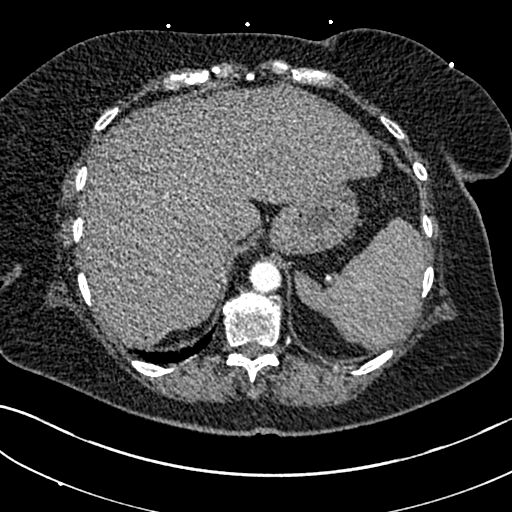
[im 89/297  lung]
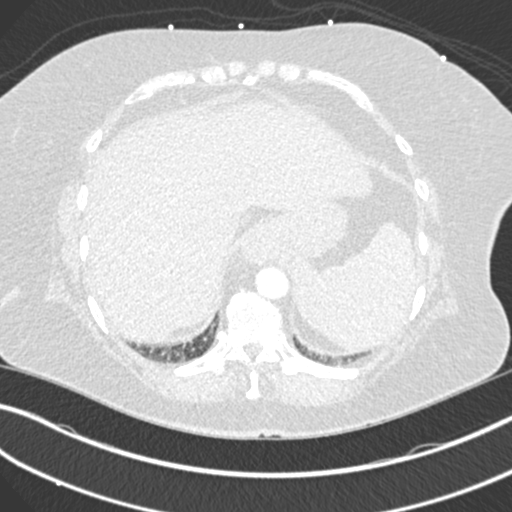
[im 104/297  soft-tissue]
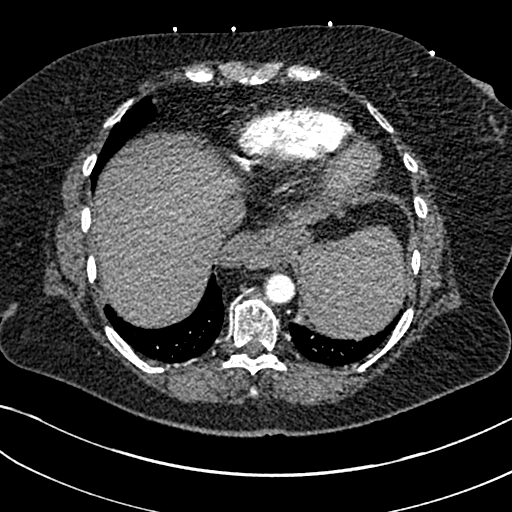
[im 134/297  lung]
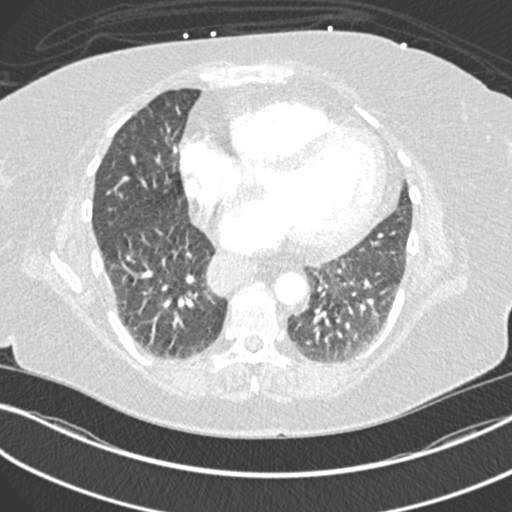
[im 149/297  soft-tissue]
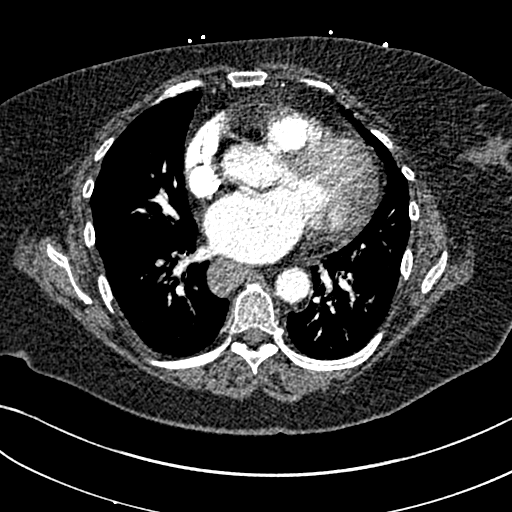
[im 163/297  lung]
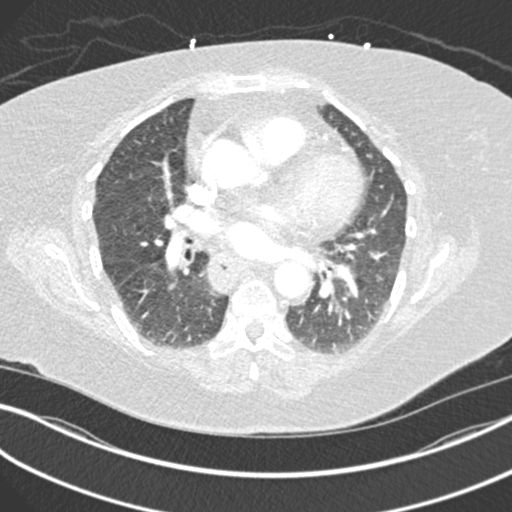
[im 193/297  soft-tissue]
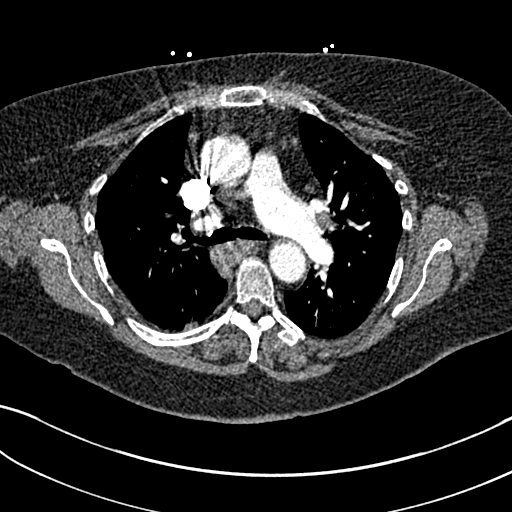
[im 208/297  lung]
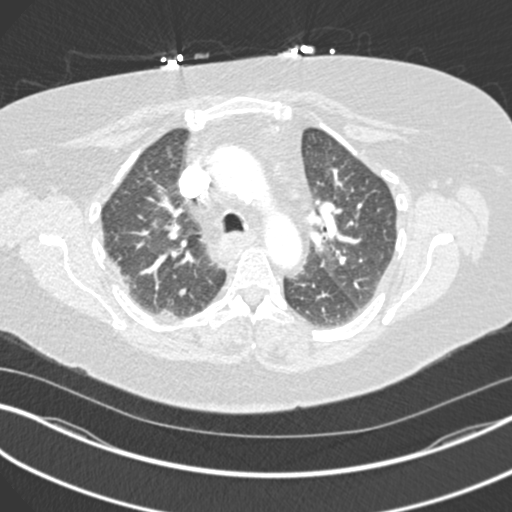
[im 223/297  soft-tissue]
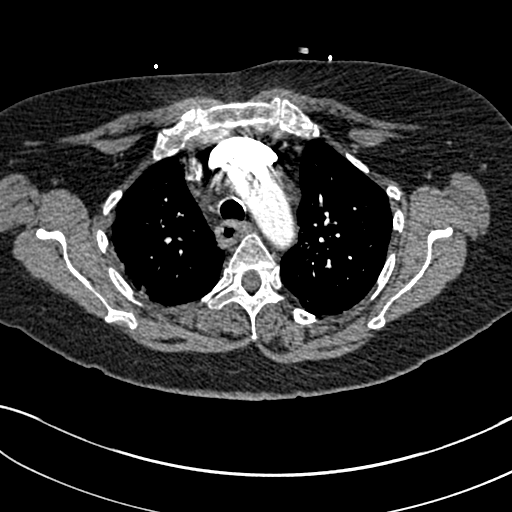
[im 237/297  lung]
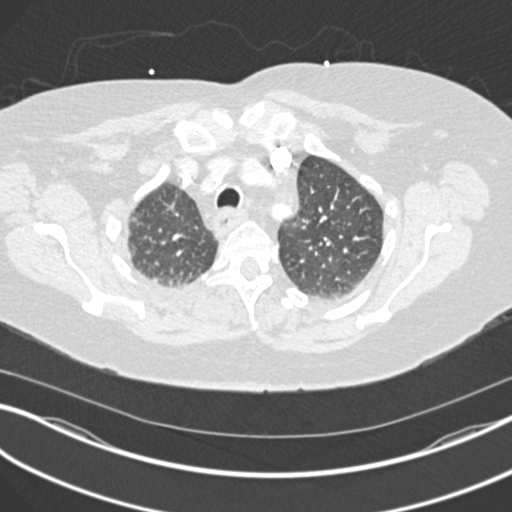
[im 267/297  soft-tissue]
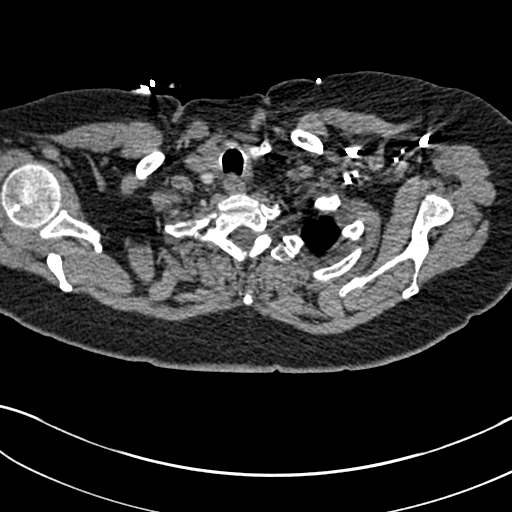
[im 282/297  lung]
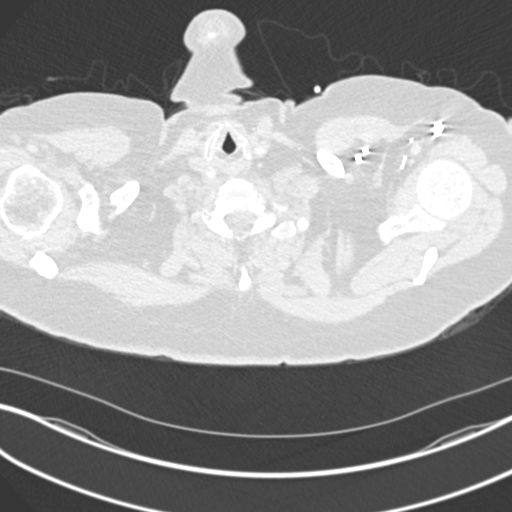

[Series 6: coronal mpr · coronal · 0.58mm/px · 3 of 151 slices shown]
[im 38/151  soft-tissue]
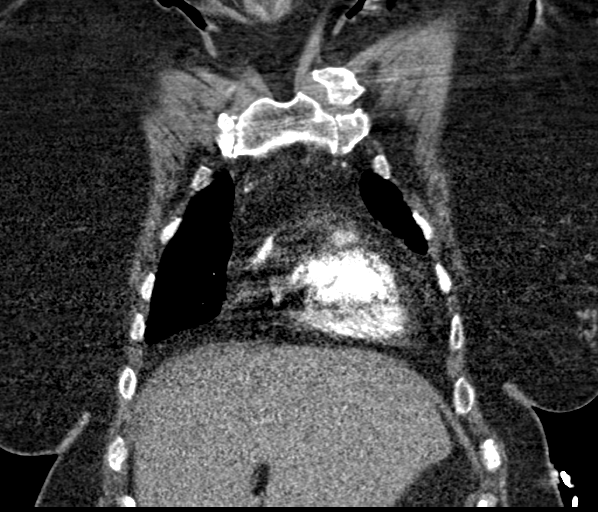
[im 76/151  soft-tissue]
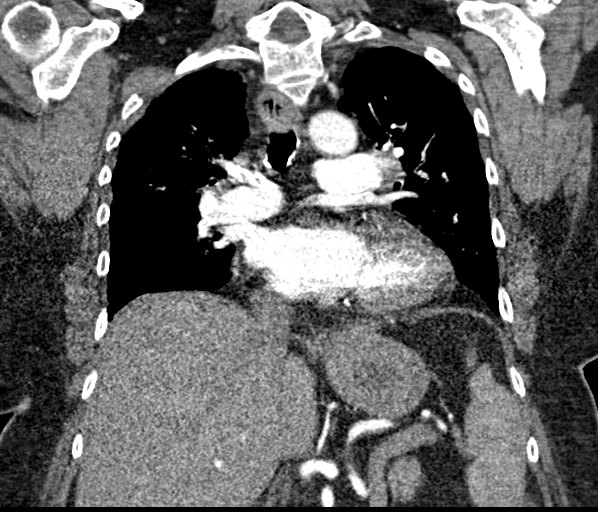
[im 113/151  soft-tissue]
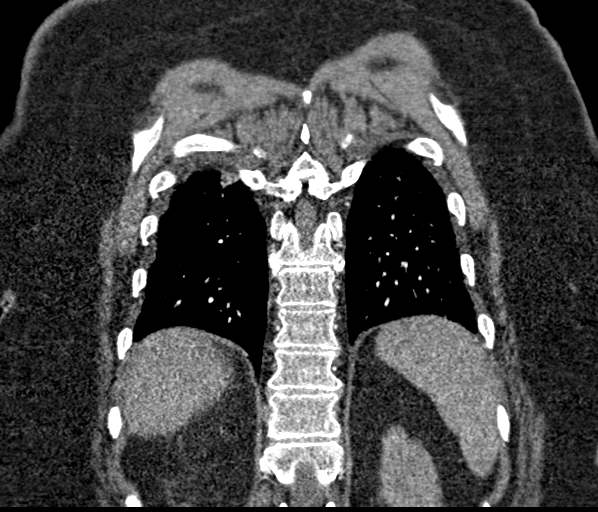

[18 of 46 positions shown; findings below may reference images not displayed]

RADIATION DOSE REDUCTION: This exam was performed according to the
departmental dose-optimization program which includes automated
exposure control, adjustment of the mA and/or kV according to
patient size and/or use of iterative reconstruction technique.

CONTRAST:  80mL OMNIPAQUE IOHEXOL 350 MG/ML SOLN
FINDINGS: Cardiovascular: The quality of this exam for evaluation of pulmonary
embolism is poor to moderate. Limitations include patient body
habitus and mild motion. The bolus is also suboptimally timed.

No central, lobar, or large segmental pulmonary embolism identified.

Aortic atherosclerosis. Tortuous thoracic aorta. Mild cardiomegaly,
without pericardial effusion. Lad coronary artery calcification.

Mediastinum/Nodes: Atrophic or surgically absent left thyroid lobe.
No middle mediastinal or hilar adenopathy. Small anterior
mediastinal nodes are not pathologic by size criteria and likely
reactive.

Small to moderate hiatal hernia with moderate diffuse mid and lower
esophageal wall thickening.

Lungs/Pleura: Resolved right-sided pleural effusion. Mild
centrilobular emphysema. 5 mm right apical pulmonary nodule on 45/10
is similar on the prior.

Upper Abdomen: Deferred to today's abdominal CT, dictated
separately. Cirrhosis

Musculoskeletal: Mid and lower thoracic spondylosis.

Review of the MIP images confirms the above findings.
IMPRESSION: 1. Multifactorial degradation, without evidence of pulmonary
embolism to the large segmental level.
2. Resolved right-sided pleural effusion.
3. Hiatal hernia. Esophageal wall thickening is most consistent with
esophagitis.
4. Age advanced coronary artery atherosclerosis. Recommend
assessment of coronary risk factors.
5. Aortic atherosclerosis (ZYNNL-ZH9.9) and emphysema ([id number].P).
6. Right apical 5 mm pulmonary nodule. Non-contrast chest CT can be
considered in 12 months, given risk factors for primary bronchogenic
carcinoma. This recommendation follows the consensus statement:
Guidelines for Management of Incidental Pulmonary Nodules Detected

## 2022-09-18 DIAGNOSIS — R188 Other ascites: Secondary | ICD-10-CM | POA: Diagnosis not present

## 2022-09-18 DIAGNOSIS — C563 Malignant neoplasm of bilateral ovaries: Secondary | ICD-10-CM | POA: Diagnosis not present

## 2022-09-18 DIAGNOSIS — K668 Other specified disorders of peritoneum: Secondary | ICD-10-CM | POA: Diagnosis not present

## 2022-09-18 DIAGNOSIS — K769 Liver disease, unspecified: Secondary | ICD-10-CM | POA: Diagnosis not present

## 2022-09-19 DIAGNOSIS — Z5111 Encounter for antineoplastic chemotherapy: Secondary | ICD-10-CM | POA: Diagnosis not present

## 2022-09-19 DIAGNOSIS — D6481 Anemia due to antineoplastic chemotherapy: Secondary | ICD-10-CM | POA: Diagnosis not present

## 2022-09-19 DIAGNOSIS — T451X5A Adverse effect of antineoplastic and immunosuppressive drugs, initial encounter: Secondary | ICD-10-CM | POA: Diagnosis not present

## 2022-09-19 DIAGNOSIS — C563 Malignant neoplasm of bilateral ovaries: Secondary | ICD-10-CM | POA: Diagnosis not present

## 2022-09-20 DIAGNOSIS — C563 Malignant neoplasm of bilateral ovaries: Secondary | ICD-10-CM | POA: Diagnosis not present

## 2022-09-20 DIAGNOSIS — R180 Malignant ascites: Secondary | ICD-10-CM | POA: Diagnosis not present

## 2022-09-25 DIAGNOSIS — I10 Essential (primary) hypertension: Secondary | ICD-10-CM | POA: Diagnosis not present

## 2022-09-25 DIAGNOSIS — E669 Obesity, unspecified: Secondary | ICD-10-CM | POA: Diagnosis not present

## 2022-09-25 DIAGNOSIS — M0570 Rheumatoid arthritis with rheumatoid factor of unspecified site without organ or systems involvement: Secondary | ICD-10-CM | POA: Diagnosis not present

## 2022-09-25 DIAGNOSIS — Z7982 Long term (current) use of aspirin: Secondary | ICD-10-CM | POA: Diagnosis not present

## 2022-09-25 DIAGNOSIS — Z6832 Body mass index (BMI) 32.0-32.9, adult: Secondary | ICD-10-CM | POA: Diagnosis not present

## 2022-09-25 DIAGNOSIS — G629 Polyneuropathy, unspecified: Secondary | ICD-10-CM | POA: Diagnosis not present

## 2022-09-25 DIAGNOSIS — Z466 Encounter for fitting and adjustment of urinary device: Secondary | ICD-10-CM | POA: Diagnosis not present

## 2022-09-25 DIAGNOSIS — Z79899 Other long term (current) drug therapy: Secondary | ICD-10-CM | POA: Diagnosis not present

## 2022-09-25 DIAGNOSIS — I743 Embolism and thrombosis of arteries of the lower extremities: Secondary | ICD-10-CM | POA: Diagnosis not present

## 2022-09-25 DIAGNOSIS — N1330 Unspecified hydronephrosis: Secondary | ICD-10-CM | POA: Diagnosis not present

## 2022-09-25 DIAGNOSIS — C563 Malignant neoplasm of bilateral ovaries: Secondary | ICD-10-CM | POA: Diagnosis not present

## 2022-09-25 DIAGNOSIS — Z8543 Personal history of malignant neoplasm of ovary: Secondary | ICD-10-CM | POA: Diagnosis not present

## 2022-09-25 DIAGNOSIS — Z7901 Long term (current) use of anticoagulants: Secondary | ICD-10-CM | POA: Diagnosis not present

## 2022-09-25 DIAGNOSIS — Z87891 Personal history of nicotine dependence: Secondary | ICD-10-CM | POA: Diagnosis not present

## 2022-09-30 ENCOUNTER — Encounter: Payer: Self-pay | Admitting: Podiatry

## 2022-09-30 ENCOUNTER — Ambulatory Visit (INDEPENDENT_AMBULATORY_CARE_PROVIDER_SITE_OTHER): Payer: Medicare (Managed Care) | Admitting: Podiatry

## 2022-09-30 DIAGNOSIS — B351 Tinea unguium: Secondary | ICD-10-CM

## 2022-09-30 DIAGNOSIS — M79675 Pain in left toe(s): Secondary | ICD-10-CM

## 2022-09-30 DIAGNOSIS — D689 Coagulation defect, unspecified: Secondary | ICD-10-CM | POA: Diagnosis not present

## 2022-09-30 DIAGNOSIS — M79674 Pain in right toe(s): Secondary | ICD-10-CM

## 2022-09-30 NOTE — Progress Notes (Signed)
This patient returns to my office for at risk foot care.  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  This patient presents for at risk foot care today.  General Appearance  Alert, conversant and in no acute stress.  Vascular  Dorsalis pedis and posterior tibial  pulses are palpable  bilaterally.  Capillary return is within normal limits  bilaterally. Temperature is within normal limits  bilaterally.  Neurologic  Senn-Weinstein monofilament wire test within normal limits  bilaterally. Muscle power within normal limits bilaterally.  Nails Thick disfigured discolored nails with subungual debris  from hallux to fifth toes bilaterally. No evidence of bacterial infection or drainage bilaterally.  Orthopedic  No limitations of motion  feet .  No crepitus or effusions noted.  No bony pathology or digital deformities noted. Right foot pain.  Skin  normotropic skin with no porokeratosis noted bilaterally.  No signs of infections or ulcers noted.     Onychomycosis  Pain in right toes  Pain in left toes  Consent was obtained for treatment procedures.   Mechanical debridement of nails 1-5  bilaterally performed with a nail nipper.  Filed with dremel without incident.    Return office visit  3  months                    Told patient to return for periodic foot care and evaluation due to potential at risk complications.   Criag Wicklund DPM   

## 2022-10-02 DIAGNOSIS — M059 Rheumatoid arthritis with rheumatoid factor, unspecified: Secondary | ICD-10-CM | POA: Diagnosis not present

## 2022-10-02 DIAGNOSIS — Z79899 Other long term (current) drug therapy: Secondary | ICD-10-CM | POA: Diagnosis not present

## 2022-10-03 DIAGNOSIS — R6889 Other general symptoms and signs: Secondary | ICD-10-CM | POA: Diagnosis not present

## 2022-10-03 DIAGNOSIS — R6883 Chills (without fever): Secondary | ICD-10-CM | POA: Diagnosis not present

## 2022-10-03 DIAGNOSIS — C563 Malignant neoplasm of bilateral ovaries: Secondary | ICD-10-CM | POA: Diagnosis not present

## 2022-10-03 DIAGNOSIS — T8089XA Other complications following infusion, transfusion and therapeutic injection, initial encounter: Secondary | ICD-10-CM | POA: Diagnosis not present

## 2022-10-03 DIAGNOSIS — T458X5A Adverse effect of other primarily systemic and hematological agents, initial encounter: Secondary | ICD-10-CM | POA: Diagnosis not present

## 2022-10-03 DIAGNOSIS — Z5111 Encounter for antineoplastic chemotherapy: Secondary | ICD-10-CM | POA: Diagnosis not present

## 2022-10-03 DIAGNOSIS — Z79634 Long term (current) use of topoisomerase inhibitor: Secondary | ICD-10-CM | POA: Diagnosis not present

## 2022-10-07 ENCOUNTER — Emergency Department (HOSPITAL_COMMUNITY): Payer: Medicare (Managed Care)

## 2022-10-07 ENCOUNTER — Ambulatory Visit (INDEPENDENT_AMBULATORY_CARE_PROVIDER_SITE_OTHER): Payer: Medicare (Managed Care) | Admitting: Family Medicine

## 2022-10-07 ENCOUNTER — Encounter: Payer: Self-pay | Admitting: Family Medicine

## 2022-10-07 ENCOUNTER — Emergency Department (HOSPITAL_COMMUNITY)
Admission: EM | Admit: 2022-10-07 | Discharge: 2022-10-07 | Disposition: A | Discharge status: Home / Self Care | Payer: Medicare (Managed Care) | Attending: Emergency Medicine | Admitting: Emergency Medicine

## 2022-10-07 ENCOUNTER — Encounter (HOSPITAL_COMMUNITY): Payer: Self-pay

## 2022-10-07 ENCOUNTER — Other Ambulatory Visit: Payer: Self-pay

## 2022-10-07 VITALS — BP 112/62 | HR 91 | Ht 67.0 in | Wt 203.6 lb

## 2022-10-07 DIAGNOSIS — E039 Hypothyroidism, unspecified: Secondary | ICD-10-CM | POA: Insufficient documentation

## 2022-10-07 DIAGNOSIS — R0789 Other chest pain: Secondary | ICD-10-CM

## 2022-10-07 DIAGNOSIS — K5900 Constipation, unspecified: Secondary | ICD-10-CM | POA: Insufficient documentation

## 2022-10-07 DIAGNOSIS — R1084 Generalized abdominal pain: Secondary | ICD-10-CM

## 2022-10-07 DIAGNOSIS — R100 Acute abdomen: Secondary | ICD-10-CM | POA: Diagnosis not present

## 2022-10-07 DIAGNOSIS — Z8543 Personal history of malignant neoplasm of ovary: Secondary | ICD-10-CM | POA: Diagnosis not present

## 2022-10-07 DIAGNOSIS — M545 Low back pain, unspecified: Secondary | ICD-10-CM | POA: Insufficient documentation

## 2022-10-07 DIAGNOSIS — Z79899 Other long term (current) drug therapy: Secondary | ICD-10-CM | POA: Diagnosis not present

## 2022-10-07 DIAGNOSIS — I10 Essential (primary) hypertension: Secondary | ICD-10-CM | POA: Diagnosis not present

## 2022-10-07 DIAGNOSIS — M549 Dorsalgia, unspecified: Secondary | ICD-10-CM | POA: Diagnosis not present

## 2022-10-07 DIAGNOSIS — Z7901 Long term (current) use of anticoagulants: Secondary | ICD-10-CM | POA: Diagnosis not present

## 2022-10-07 DIAGNOSIS — I700 Atherosclerosis of aorta: Secondary | ICD-10-CM | POA: Diagnosis not present

## 2022-10-07 DIAGNOSIS — R109 Unspecified abdominal pain: Secondary | ICD-10-CM

## 2022-10-07 DIAGNOSIS — R110 Nausea: Secondary | ICD-10-CM | POA: Insufficient documentation

## 2022-10-07 LAB — CBC WITH DIFFERENTIAL/PLATELET
Abs Immature Granulocytes: 0 10*3/uL (ref 0.00–0.07)
Basophils Absolute: 0 10*3/uL (ref 0.0–0.1)
Basophils Relative: 0 %
Eosinophils Absolute: 0 10*3/uL (ref 0.0–0.5)
Eosinophils Relative: 1 %
HCT: 25.8 % — ABNORMAL LOW (ref 36.0–46.0)
Hemoglobin: 8 g/dL — ABNORMAL LOW (ref 12.0–15.0)
Lymphocytes Relative: 26 %
Lymphs Abs: 0.3 10*3/uL — ABNORMAL LOW (ref 0.7–4.0)
MCH: 26.4 pg (ref 26.0–34.0)
MCHC: 31 g/dL (ref 30.0–36.0)
MCV: 85.1 fL (ref 80.0–100.0)
Monocytes Absolute: 0 10*3/uL — ABNORMAL LOW (ref 0.1–1.0)
Monocytes Relative: 0 %
Neutro Abs: 0.7 10*3/uL — ABNORMAL LOW (ref 1.7–7.7)
Neutrophils Relative %: 73 %
Platelets: 144 10*3/uL — ABNORMAL LOW (ref 150–400)
RBC: 3.03 MIL/uL — ABNORMAL LOW (ref 3.87–5.11)
RDW: 17.2 % — ABNORMAL HIGH (ref 11.5–15.5)
WBC: 1.1 10*3/uL — CL (ref 4.0–10.5)
nRBC: 0 % (ref 0.0–0.2)
nRBC: 1 /100 WBC — ABNORMAL HIGH

## 2022-10-07 LAB — I-STAT CHEM 8, ED
BUN: 4 mg/dL — ABNORMAL LOW (ref 8–23)
Calcium, Ion: 0.95 mmol/L — ABNORMAL LOW (ref 1.15–1.40)
Chloride: 102 mmol/L (ref 98–111)
Creatinine, Ser: 0.4 mg/dL — ABNORMAL LOW (ref 0.44–1.00)
Glucose, Bld: 74 mg/dL (ref 70–99)
HCT: 29 % — ABNORMAL LOW (ref 36.0–46.0)
Hemoglobin: 9.9 g/dL — ABNORMAL LOW (ref 12.0–15.0)
Potassium: 2.8 mmol/L — ABNORMAL LOW (ref 3.5–5.1)
Sodium: 137 mmol/L (ref 135–145)
TCO2: 24 mmol/L (ref 22–32)

## 2022-10-07 LAB — MAGNESIUM: Magnesium: 1.3 mg/dL — ABNORMAL LOW (ref 1.7–2.4)

## 2022-10-07 LAB — BODY FLUID CELL COUNT WITH DIFFERENTIAL
Eos, Fluid: 1 %
Lymphs, Fluid: 87 %
Monocyte-Macrophage-Serous Fluid: 5 % — ABNORMAL LOW (ref 50–90)
Neutrophil Count, Fluid: 7 % (ref 0–25)
Total Nucleated Cell Count, Fluid: 90 cu mm (ref 0–1000)

## 2022-10-07 LAB — TROPONIN I (HIGH SENSITIVITY)
Troponin I (High Sensitivity): 5 ng/L (ref <18)
Troponin I (High Sensitivity): 5 ng/L (ref <18)

## 2022-10-07 LAB — COMPREHENSIVE METABOLIC PANEL
ALT: 21 U/L (ref 0–44)
AST: 27 U/L (ref 15–41)
Albumin: 2.2 g/dL — ABNORMAL LOW (ref 3.5–5.0)
Alkaline Phosphatase: 102 U/L (ref 38–126)
Anion gap: 8 (ref 5–15)
BUN: 6 mg/dL — ABNORMAL LOW (ref 8–23)
CO2: 26 mmol/L (ref 22–32)
Calcium: 8.2 mg/dL — ABNORMAL LOW (ref 8.9–10.3)
Chloride: 101 mmol/L (ref 98–111)
Creatinine, Ser: 0.68 mg/dL (ref 0.44–1.00)
GFR, Estimated: >60 mL/min (ref >60)
Glucose, Bld: 97 mg/dL (ref 70–99)
Potassium: 3.6 mmol/L (ref 3.5–5.1)
Sodium: 135 mmol/L (ref 135–145)
Total Bilirubin: 0.4 mg/dL (ref 0.3–1.2)
Total Protein: 5.2 g/dL — ABNORMAL LOW (ref 6.5–8.1)

## 2022-10-07 LAB — PROTIME-INR
INR: 3.5 — ABNORMAL HIGH (ref 0.8–1.2)
Prothrombin Time: 35.6 seconds — ABNORMAL HIGH (ref 11.4–15.2)

## 2022-10-07 LAB — CBC
HCT: 26 % — ABNORMAL LOW (ref 36.0–46.0)
Hemoglobin: 8.1 g/dL — ABNORMAL LOW (ref 12.0–15.0)
MCH: 26.9 pg (ref 26.0–34.0)
MCHC: 31.2 g/dL (ref 30.0–36.0)
MCV: 86.4 fL (ref 80.0–100.0)
Platelets: 148 10*3/uL — ABNORMAL LOW (ref 150–400)
RBC: 3.01 MIL/uL — ABNORMAL LOW (ref 3.87–5.11)
RDW: 17.2 % — ABNORMAL HIGH (ref 11.5–15.5)
WBC: 1.2 10*3/uL — CL (ref 4.0–10.5)
nRBC: 0 % (ref 0.0–0.2)

## 2022-10-07 LAB — URINALYSIS, ROUTINE W REFLEX MICROSCOPIC
Bilirubin Urine: NEGATIVE
Glucose, UA: NEGATIVE mg/dL
Ketones, ur: NEGATIVE mg/dL
Leukocytes,Ua: NEGATIVE
Nitrite: NEGATIVE
Protein, ur: NEGATIVE mg/dL
Specific Gravity, Urine: 1.006 (ref 1.005–1.030)
pH: 7 (ref 5.0–8.0)

## 2022-10-07 LAB — BODY FLUID CULTURE W GRAM STAIN

## 2022-10-07 LAB — CBG MONITORING, ED: Glucose-Capillary: 76 mg/dL (ref 70–99)

## 2022-10-07 LAB — GLUCOSE, PLEURAL OR PERITONEAL FLUID: Glucose, Fluid: 59 mg/dL

## 2022-10-07 LAB — LIPASE, BLOOD: Lipase: 26 U/L (ref 11–51)

## 2022-10-07 LAB — HEMOGLOBIN, FINGERSTICK: POC HEMOGLOBIN: 9.6 g/dL — ABNORMAL LOW (ref 12.0–15.0)

## 2022-10-07 MED ORDER — ACETAMINOPHEN 325 MG PO TABS
650.0000 mg | ORAL_TABLET | Freq: Once | ORAL | Status: AC
  Administered 2022-10-07: 650 mg via ORAL
  Filled 2022-10-07: qty 2

## 2022-10-07 MED ORDER — FENTANYL CITRATE PF 50 MCG/ML IJ SOSY: 50.0000 ug | PREFILLED_SYRINGE | Freq: Once | INTRAMUSCULAR | Status: DC

## 2022-10-07 MED ORDER — SENNOSIDES-DOCUSATE SODIUM 8.6-50 MG PO TABS: 1.0000 | ORAL_TABLET | Freq: Every day | ORAL | 0 refills | Status: AC | PRN

## 2022-10-07 MED ORDER — METHOCARBAMOL 500 MG PO TABS
500.0000 mg | ORAL_TABLET | Freq: Once | ORAL | Status: AC
  Administered 2022-10-07: 500 mg via ORAL
  Filled 2022-10-07: qty 1

## 2022-10-07 MED ORDER — MAGNESIUM SULFATE 2 GM/50ML IV SOLN
2.0000 g | Freq: Once | INTRAVENOUS | Status: AC
  Administered 2022-10-07: 2 g via INTRAVENOUS
  Filled 2022-10-07: qty 50

## 2022-10-07 MED ORDER — SODIUM CHLORIDE 0.9 % IV SOLN
2.0000 g | Freq: Once | INTRAVENOUS | Status: DC
  Administered 2022-10-07: 2 g via INTRAVENOUS
  Filled 2022-10-07: qty 20

## 2022-10-07 MED ORDER — IOHEXOL 350 MG/ML SOLN
100.0000 mL | Freq: Once | INTRAVENOUS | Status: AC | PRN
  Administered 2022-10-07: 100 mL via INTRAVENOUS

## 2022-10-07 MED ORDER — LACTATED RINGERS IV BOLUS
500.0000 mL | Freq: Once | INTRAVENOUS | Status: AC
  Administered 2022-10-07: 500 mL via INTRAVENOUS

## 2022-10-07 MED ORDER — HEPARIN SOD (PORK) LOCK FLUSH 100 UNIT/ML IV SOLN
500.0000 [IU] | Freq: Once | INTRAVENOUS | Status: AC
  Administered 2022-10-07: 500 [IU]
  Filled 2022-10-07: qty 5

## 2022-10-07 MED ORDER — HEPARIN SODIUM (PORCINE) 5000 UNIT/ML IJ SOLN
INTRAMUSCULAR | Status: AC
  Filled 2022-10-07: qty 1

## 2022-10-07 NOTE — ED Triage Notes (Signed)
PT arrives via [facility] from [facility].with a c/o of abdominal pain and back pain.Had explosive diarrhea wed and thurs and hasn't had a bm since then, is currently doing chemo. (20yrs) Has been taking oxy for her back pain.Yesterday tried to use the bathroom and brown mucus came out. Back pain is flank and intermittent.PT did drain her pleurux tube for her ascites but not much came out

## 2022-10-07 NOTE — ED Notes (Signed)
Pt ambulated to restroom with minimal assistance.

## 2022-10-07 NOTE — ED Provider Notes (Signed)
Patient received as sign-out from Dr Durwin Nora.  Briefly this is a 62 yo female with metastatic cancer and chronic ascites presenting to the ED with abdominal pain, constipation, vomiting  CT dissection study showing focal area of loculated ascites  Physical Exam  BP (!) 161/104   Pulse 79   Temp 98.2 F (36.8 C) (Oral)   Resp 20   SpO2 100%   Physical Exam  Procedures  Procedures  ED Course / MDM   Clinical Course as of 10/07/22 2043  Tue Oct 07, 2022  1815 I spoken to the [facility] transfer center and awaiting callback from their on-call oncologist about potentially admitting and transferring the patient.  Patient continues to have some mild to moderate abdominal pain, no fever.  She has some mild abdominal tenderness but no rigidity.  We have been unable to locate appropriate equipment to attach to her abdominal drain at this time to draw off ascites for testing - but nursing is talking to IR and surgical units.  I have initiated antibiotics. [MT]  1850 I spoke to DR Leroy Kennedy from [facility].  We reviewed the patient's workup in the ED, including the lab work, which is consistent with pancytopenia from her chemotherapy according to the specialist.  Given that the patient has been afebrile with normal vital signs observed for nearly 9 hours in the ED, the specialist recommended consideration for discharge with close outpatient follow-up.  Their office can contact the patient to arrange for this and check on the patient's symptoms.  I do think this is reasonable.  On my reassessment the patient does not have significant abdominal tenderness or rigidity; my suspicion for SBP remains relatively low. [MT]  2042 Neutrophil Count, Fluid: 7 [MT]  2042 Abdominal fluid sample does not appear consistent with infection.  Neutrophils within normal limits.  Patient was reassessed and she is quite comfortable with the idea of going home, and I think this is a reasonable plan at this point.  The patient  had been ordered initially Rocephin, empirically, but I have a lower suspicion at this point for sepsis do not believe she is needing ongoing antibiotics.  She will follow-up with her specialist team.  We discussed a laxative regimen I will prescribe Peri-Colace for her. [MT]    Clinical Course User Index [MT] Aston Lawhorn, Kermit Balo, MD   Medical Decision Making Amount and/or Complexity of Data Reviewed Labs: ordered. Decision-making details documented in ED Course. Radiology: ordered.  Risk OTC drugs. Prescription drug management.          Terald Sleeper, MD 10/07/22 2043

## 2022-10-07 NOTE — ED Provider Notes (Signed)
[place] EMERGENCY DEPARTMENT AT [facility] Provider Note   CSN: [id number] Arrival date & time: 10/07/22  1191     History  Chief Complaint  Patient presents with   Abdominal Pain   Back Pain    Donna [name] is a 62 y.o. female.   Abdominal Pain Associated symptoms: constipation, diarrhea and nausea   Back Pain Associated symptoms: abdominal pain   Patient presents for abdominal and back pain.  Medical history includes HTN, GERD, arthritis, HLD, hypothyroidism, metastatic ovarian cancer, rheumatoid arthritis, anemia.  She is on warfarin.  She receives her cancer care through [facility].  Ovarian cancer was initially diagnosed in 2016.  She underwent surgery and chemotherapy at the time.  In 2020, she was found to have progressive disease.  She has been undergoing additional rounds of chemotherapy since 2021.  She is now on topotecan.  Last infusion was 11 days ago.  When they accessed her port that time, she experienced upper back pain.  She has had ongoing intermittent back pain since then.  She did start using oxycodone due to the back pain.  She has chronic constipation and does take stool softeners.  1 week ago, she had 2 days of diarrhea.  She then had constipation for 5 days.  She treated her constipation with over-the-counter laxatives and MiraLAX.  She had 1 small mucousy bowel movement yesterday.  Over the past several days, she has had progressive abdominal pain, greater on the left side.  She feels that her abdomen is distended.  She has a Pleurx catheter in her abdomen and states that the output yesterday was less than normal.  She was seen at her primary care doctor's office this morning and was advised to come to the ED for further evaluation.  Current pain is 7/10 in severity.  Although she has had recent nausea, she denies any currently.     Home Medications Prior to Admission medications   Medication Sig Start Date End Date Taking? Authorizing Provider   albuterol (VENTOLIN HFA) 108 (90 Base) MCG/ACT inhaler Inhale 2 puffs into the lungs every 6 (six) hours as needed for wheezing or shortness of breath. 09/01/21   Rolla Etienne, NP  ALPRAZolam Prudy Feeler) 0.5 MG tablet Take 1 tablet (0.5 mg total) by mouth 3 (three) times daily as needed. Patient taking differently: Take 0.5 mg by mouth 3 (three) times daily as needed for anxiety. 06/11/21   Donita Brooks, MD  amLODipine (NORVASC) 10 MG tablet Take 1 tablet by mouth once daily 08/11/22   Donita Brooks, MD  aspirin EC 81 MG tablet Take 81 mg by mouth daily. Swallow whole.    [provider]  atorvastatin (LIPITOR) 80 MG tablet TAKE 1 TABLET BY MOUTH ONCE DAILY ---PT  NEED  CPE  APPT  W/PCP  FOR  FURTHER  REFILLS 09/12/22   Donita Brooks, MD  calcium carbonate (OS-CAL - DOSED IN MG OF ELEMENTAL CALCIUM) 1250 (500 Ca) MG tablet Take 1 tablet (500 mg of elemental calcium total) by mouth 3 (three) times daily with meals. Patient taking differently: Take 1 tablet by mouth daily with breakfast. 08/17/20   Rodolph Bong, MD  cephALEXin (KEFLEX) 500 MG capsule Take 1 capsule (500 mg total) by mouth 3 (three) times daily. 08/12/22   Donita Brooks, MD  Cholecalciferol (VITAMIN D3) 50 MCG (2000 UT) capsule Take 2,000 Units by mouth daily. 10/17/18   [provider]  cyclophosphamide (CYTOXAN) 50 MG  capsule Take 50mg  (1 capsule) by mouth daily every morning with a full glass of water. 03/03/22   [provider]  gabapentin (NEURONTIN) 300 MG capsule TAKE 1 CAPSULE TWICE A DAY 08/07/22   Donita Brooks, MD  leflunomide (ARAVA) 20 MG tablet Take 20 mg by mouth daily. 01/20/19   [provider]  levothyroxine (SYNTHROID) 175 MCG tablet TAKE 1 TABLET DAILY BEFORE BREAKFAST (NEED APPOINTMENT TO CHECK TSH AS SOON AS POSSIBLE) 04/07/22   Donita Brooks, MD  losartan (COZAAR) 50 MG tablet Take 1 tablet by mouth once daily 08/11/22   Donita Brooks, MD  MAGNESIUM PO Take  800 mg by mouth daily.    [provider]  neomycin-polymyxin-hydrocortisone (CORTISPORIN) OTIC solution Place 3 drops into the right ear 4 (four) times daily. 04/15/22   Donita Brooks, MD  omeprazole (PRILOSEC) 40 MG capsule Take 1 capsule (40 mg total) by mouth in the morning and at bedtime. Patient taking differently: Take 40 mg by mouth daily as needed (acid reflux / GERD). 01/16/20   Donita Brooks, MD  ondansetron (ZOFRAN) 8 MG tablet Take 8 mg by mouth daily as needed for nausea, vomiting or refractory nausea / vomiting. 08/10/18   [provider]  oxyCODONE (OXY IR/ROXICODONE) 5 MG immediate release tablet Take 1 tablet (5 mg total) by mouth 4 (four) times daily as needed for moderate pain. 11/25/21   Donita Brooks, MD  potassium chloride SA (KLOR-CON M) 20 MEQ tablet TAKE 2  BY MOUTH ONCE DAILY 12/30/21   Donita Brooks, MD  PREDNISOLONE ACETATE OP Place 1 drop into both eyes See admin instructions. Take 1 drop both eyes on the day before, day of and 7 days after chemotherapy    [provider]  vitamin B-12 (CYANOCOBALAMIN) 1000 MCG tablet Take 1,000 mcg by mouth daily.    [provider]  warfarin (COUMADIN) 5 MG tablet TAKE ONE AND ONE-HALF TABLETS ONCE DAILY ON TUESDAY, THURSDAY, SATURDAY AND SUNDAY, THEN TAKE 2 TABLETS ONCE DAILY ON OTHER DAYS 02/20/22   Donita Brooks, MD      Allergies    Paclitaxel    Review of Systems   Review of Systems  Constitutional:  Positive for appetite change.  Gastrointestinal:  Positive for abdominal distention, abdominal pain, constipation, diarrhea and nausea.  Musculoskeletal:  Positive for back pain.  All other systems reviewed and are negative.   Physical Exam Updated Vital Signs BP (!) 138/90   Pulse 77   Temp 98.1 F (36.7 C) (Oral)   Resp (!) 22   SpO2 100%  Physical Exam Vitals and nursing note reviewed.  Constitutional:      General: She is not in acute distress.    Appearance:  She is well-developed. She is not ill-appearing, toxic-appearing or diaphoretic.  HENT:     Head: Normocephalic and atraumatic.     Mouth/Throat:     Mouth: Mucous membranes are moist.  Eyes:     General: No scleral icterus.    Extraocular Movements: Extraocular movements intact.     Conjunctiva/sclera: Conjunctivae normal.  Cardiovascular:     Rate and Rhythm: Normal rate and regular rhythm.  Pulmonary:     Effort: Pulmonary effort is normal. No respiratory distress.  Abdominal:     General: There is distension.     Palpations: Abdomen is soft.     Tenderness: There is generalized abdominal tenderness. There is no guarding or rebound.  Musculoskeletal:  General: No swelling.     Cervical back: Neck supple.  Skin:    General: Skin is warm and dry.     Coloration: Skin is not cyanotic, jaundiced or pale.  Neurological:     General: No focal deficit present.     Mental Status: She is alert and oriented to person, place, and time.  Psychiatric:        Mood and Affect: Mood normal.        Behavior: Behavior normal.     ED Results / Procedures / Treatments   Labs (all labs ordered are listed, but only abnormal results are displayed) Labs Reviewed  COMPREHENSIVE METABOLIC PANEL - Abnormal; Notable for the following components:      Result Value   BUN 6 (*)    Calcium 8.2 (*)    Total Protein 5.2 (*)    Albumin 2.2 (*)    All other components within normal limits  CBC - Abnormal; Notable for the following components:   WBC 1.2 (*)    RBC 3.01 (*)    Hemoglobin 8.1 (*)    HCT 26.0 (*)    RDW 17.2 (*)    Platelets 148 (*)    All other components within normal limits  URINALYSIS, ROUTINE W REFLEX MICROSCOPIC - Abnormal; Notable for the following components:   Color, Urine STRAW (*)    Hgb urine dipstick MODERATE (*)    Bacteria, UA RARE (*)    All other components within normal limits  MAGNESIUM - Abnormal; Notable for the following components:   Magnesium 1.3 (*)     All other components within normal limits  PROTIME-INR - Abnormal; Notable for the following components:   Prothrombin Time 35.6 (*)    INR 3.5 (*)    All other components within normal limits  CBC WITH DIFFERENTIAL/PLATELET - Abnormal; Notable for the following components:   WBC 1.1 (*)    RBC 3.03 (*)    Hemoglobin 8.0 (*)    HCT 25.8 (*)    RDW 17.2 (*)    Platelets 144 (*)    Neutro Abs 0.7 (*)    Lymphs Abs 0.3 (*)    Monocytes Absolute 0.0 (*)    nRBC 1 (*)    All other components within normal limits  I-STAT CHEM 8, ED - Abnormal; Notable for the following components:   Potassium 2.8 (*)    BUN 4 (*)    Creatinine, Ser 0.40 (*)    Calcium, Ion 0.95 (*)    Hemoglobin 9.9 (*)    HCT 29.0 (*)    All other components within normal limits  BODY FLUID CULTURE W GRAM STAIN  GRAM STAIN  LIPASE, BLOOD  PATHOLOGIST SMEAR REVIEW  SYNOVIAL CELL COUNT + DIFF, W/ CRYSTALS  GLUCOSE, BODY FLUID OTHER            CBG MONITORING, ED  TROPONIN I (HIGH SENSITIVITY)  TROPONIN I (HIGH SENSITIVITY)    EKG EKG Interpretation  Date/Time:  Tuesday Oct 07 2022 10:47:25 EDT Ventricular Rate:  69 PR Interval:  155 QRS Duration: 93 QT Interval:  397 QTC Calculation: 426 R Axis:   24 Text Interpretation: Sinus rhythm Probable left atrial enlargement Low voltage, precordial leads Borderline T abnormalities, anterior leads Confirmed by Gloris Manchester ([id number]) on 10/07/2022 11:43:12 AM  Radiology CT Angio Chest/Abd/Pel for Dissection W and/or Wo Contrast  Result Date: 10/07/2022 CLINICAL DATA:  History of ovarian cancer. Acute aortic syndrome suspected. * Tracking Code: BO *  EXAM: CT ANGIOGRAPHY CHEST, ABDOMEN AND PELVIS TECHNIQUE: Non-contrast CT of the chest was initially obtained. Multidetector CT imaging through the chest, abdomen and pelvis was performed using the standard protocol during bolus administration of intravenous contrast. Multiplanar reconstructed images and MIPs were obtained  and reviewed to evaluate the vascular anatomy. RADIATION DOSE REDUCTION: This exam was performed according to the departmental dose-optimization program which includes automated exposure control, adjustment of the mA and/or kV according to patient size and/or use of iterative reconstruction technique. CONTRAST:  OMNIPAQUE IOHEXOL 350 MG/ML SOLN COMPARISON:  CT AP 10/19/2021 and CT angio chest 10/19/2021 FINDINGS: CTA CHEST FINDINGS Cardiovascular: Preferential opacification of the thoracic aorta. No evidence of thoracic aortic aneurysm or dissection. Normal heart size. No pericardial effusion. Mediastinum/Nodes: Aortic atherosclerosis and coronary artery calcifications. Scalp thyroid gland and trachea demonstrate no significant findings. Circumferential wall thickening involving the mid and distal esophagus noted. Moderate size hiatal hernia. Ascites identified within the hernia sac. No enlarged mediastinal or hilar lymph nodes. Lungs/Pleura: Small to moderate left pleural effusion. Mild increase interstitial markings identified may reflect interstitial edema. No airspace consolidation or pneumothorax identified. Biapical pleuroparenchymal scarring noted. Several lung nodules are noted including: -Small nodule within the right lung base measures 5 mm, image 55/7. New from 10/19/2021 -Stable nodule in the posterior right upper lobe measures 3 mm, image 30/7. Subpleural nodule in -New posterior right apex measures 7 mm, image 20/7. Musculoskeletal: No chest wall abnormality. No acute or significant osseous findings. Review of the MIP images confirms the above findings. CTA ABDOMEN AND PELVIS FINDINGS VASCULAR Aorta: Normal caliber aorta without aneurysm, dissection, vasculitis or significant stenosis. Extensive aortic atherosclerosis. Celiac: Patent without evidence of aneurysm, dissection, vasculitis or significant stenosis. SMA: Patent without evidence of aneurysm, dissection, vasculitis or significant  stenosis. Renals: No evidence for aneurysm, dissection or vasculitis. Calcified plaque identified at the origin of bilateral renal arteries with approximately 50% stenosis on the right and 30% stenosis on the left. 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 Distal reconstitution of the IMA branches. Inflow: Patent without evidence of aneurysm, dissection, vasculitis or significant stenosis. Veins: No obvious venous abnormality within the limitations of this arterial phase study. Review of the MIP images confirms the above findings. NON-VASCULAR Hepatobiliary: He there is an indeterminate low-density structure within segment 4 a measuring 1.1 cm, image 76/6. Not seen on previous imaging. Gallbladder appears normal. No bile duct dilatation identified. Pancreas: Unremarkable. No pancreatic ductal dilatation or surrounding inflammatory changes. Spleen: Normal in size without focal abnormality. Adrenals/Urinary Tract: Normal adrenal glands. Mild left renal atrophy with cortical scarring. Left-sided hydronephrosis is identified with nephroureteral stent in place. No right-sided hydronephrosis. Soft tissue mass associated with the left side of vaginal cuff is identified which encases the distal left ureter and invades the left lateral bladder base. This measures 3.2 x 2.9 cm, image 184/6. On the exam from 10/19/2021 this measured 2.7 x 2.0 cm. Stomach/Bowel: Moderate hiatal hernia. Stomach is nondistended. There is no pathologic dilatation of the large or small bowel loops. No bowel wall thickening or inflammation identified. Lymphatic: No mesenteric or retroperitoneal adenopathy. Reproductive: No pelvic or inguinal adenopathy. Status post hysterectomy. The soft tissue nodule along the left side of vaginal cuff measures 3.2 x 2.9 cm, image 184/6. Other: There is a percutaneous drainage catheter which enters from a right lower quadrant approach. The tip terminates along the undersurface of the left lateral abdominal wall. Moderate  volume of ascites is identified which appears partially loculated. For example  within the right hemiabdomen there is a focal area of loculated ascites measuring 14.7 x 13.0 cm, image 26/6. Within the lesser sac there is focal fluid E measuring 9.4 x 9.3 cm, image 91/6. Peritoneal nodularity is again identified scratch set peritoneal nodularity is identified. Soft tissue nodule along the undersurface of the ventral abdominal wall measures 4.1 x 1.5 cm, image 118/6. This is a new finding from the previous exam. Musculoskeletal: No acute or significant osseous findings. Review of the MIP images confirms the above findings. IMPRESSION: 1. No evidence for aortic dissection or aneurysm. 2. Left-sided hydronephrosis with nephroureteral stent in place. 3. Interval increase in size of soft tissue mass associated with the left side of vaginal cuff which encases the distal left ureter and invades the left lateral bladder base. 4. Moderate volume of ascites which appears partially loculated. 5. 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 6. New small to moderate left pleural effusion. 7. New nonspecific small lung nodules are identified within the right lung base and posterior right apex. 8. Indeterminate low-density structure within segment 4a measuring 1.1 cm. Not seen on previous imaging. If clinically indicated, consider more definitive characterization with nonemergent contrast enhanced MRI of the liver. 9. Hiatal hernia with circumferential wall thickening involving the mid and distal esophagus. Correlate for any clinical signs or symptoms of esophagitis. Electronically Signed   By: Signa Kell M.D.   On: 10/07/2022 13:03    Procedures Procedures    Medications Ordered in ED Medications  lactated ringers bolus 500 mL (0 mLs Intravenous Stopped 10/07/22 1300)  iohexol (OMNIPAQUE) 350 MG/ML injection 100 mL (100 mLs Intravenous Contrast Given 10/07/22 1228)  magnesium sulfate IVPB 2 g 50 mL (0  g Intravenous Stopped 10/07/22 1627)  acetaminophen (TYLENOL) tablet 650 mg (650 mg Oral Given 10/07/22 1543)  methocarbamol (ROBAXIN) tablet 500 mg (500 mg Oral Given 10/07/22 1543)    ED Course/ Medical Decision Making/ A&P                             Medical Decision Making Amount and/or Complexity of Data Reviewed Labs: ordered. Radiology: ordered.  Risk OTC drugs. Prescription drug management.   This patient presents to the ED for concern of abdominal pain, this involves an extensive number of treatment options, and is a complaint that carries with it a high risk of complications and morbidity.  The differential diagnosis includes constipation, worsening of cancer, bowel obstruction, mesenteric ischemia, infection   Co morbidities that complicate the patient evaluation  HTN, GERD, arthritis, HLD, hypothyroidism, metastatic ovarian cancer, rheumatoid arthritis, anemia   Additional history obtained:  Additional history obtained from N/A External records from outside source obtained and reviewed including EMR   Lab Tests:  I Ordered, and personally interpreted labs.  The pertinent results include: Leukopenia, decreased hemoglobin from baseline, hypomagnesemia, normal troponin   Imaging Studies ordered:  I ordered imaging studies including CTA dissection study I independently visualized and interpreted imaging which showed interval increase in left-sided vaginal cuff mass, ascites (now partially likely related), peritoneal nodules; new liver lesion and new right lung nodules; left pleural effusion I agree with the radiologist interpretation   Cardiac Monitoring: / EKG:  The patient was maintained on a cardiac monitor.  I personally viewed and interpreted the cardiac monitored which showed an underlying rhythm of: Sinus rhythm   Problem List / ED Course / Critical interventions / Medication management  Patient presents for abdominal and  back pain.  Back pain has been  ongoing for the past 1.5 weeks.  She has been treating it with oxycodone.  After 2 days of diarrhea last week, she has since has constipation and progressive abdominal pain and distention.  She was seen by primary care doctor earlier today and advised to come to the ED.  On arrival in the ED, vital signs are normal.  Patient is well-appearing on exam.  Her abdomen is mildly distended.  A Pleurx catheter is present in the right lower quadrant.  Abdomen is soft but she does endorse some generalized tenderness, greater on the left side.  Patient declines any pain medication at this time.  She denies any current nausea.  IV fluids were ordered for hydration given her decreased p.o. intake lately.  Patient to undergo lab work and CT imaging.  Lab work and CT imaging findings are noted above.  Although patient is concerned of constipation, there does not appear to be a large stool burden on CT imaging.  She did undergo an enema which produced a small amount of stool.  I suspect her abdominal discomfort is secondary to worsening ascites and intra-abdominal nodules.  Ascites today appears to be loculated which limits the drainage from her indwelling peritoneal catheter.  Given her leukopenia, this does raise concern for peritoneal infection.  I spoke with the patient about this who is agreeable to drain some of her peritoneal fluid for testing.  Nursing to work with her to get supplies that she needs.  Consult to gyn-onc was placed.  Care patient was signed out to oncoming ED provider. I ordered medication including IVF for hydration; magnesium sulfate for hypomagnesemia; Tylenol and Robaxin for analgesia Reevaluation of the patient after these medicines showed that the patient improved I have reviewed the patients home medicines and have made adjustments as needed   Social Determinants of Health:  Has access to outpatient care         Final Clinical Impression(s) / ED Diagnoses Final diagnoses:   Generalized abdominal pain    Rx / DC Orders ED Discharge Orders     None         Gloris Manchester, MD 10/07/22 1705

## 2022-10-07 NOTE — Discharge Instructions (Signed)
Please follow-up with your oncology team from [facility].  We spoke to them on the phone today from the emergency department (oncall oncologist Dr. Leroy Kennedy) and reviewed your blood test.  Your blood counts, white blood cell count, hemoglobin, platelets are low, which is likely a side effect of your chemotherapy.  We do not see clear signs of infection by testing your abdominal fluid.  Your specialist team felt it was reasonable for you to follow-up with them outpatient in the office.  If you do begin developing fevers, persistent vomiting, worsening severe abdominal pain, lightheadedness or loss of consciousness, please return to the emergency department.  I would also recommend a bowel cleanout, consider using the Peri-Colace stool softener laxative that was prescribed, as well as a rectal suppository for the next 2 or 3 days, to stimulate your bowel movement.

## 2022-10-07 NOTE — ED Notes (Signed)
Bladder scan post void 89 ml.

## 2022-10-07 NOTE — Progress Notes (Signed)
No ma'am  Subjective:    Patient ID: Donna [name], female    DOB: 1960/12/20, 62 y.o.   MRN: [id number]  Patient has a history of stage IV metastatic ovarian cancer.  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  Hgb was recently 7.3 at her oncologist ([facility]).  Received 1 unit of PRBC due to anemia from chemotherapy.  Fingerstick hgb today is 9.6.  He presented with acute abdomen.  She is been having low back pain for the last 3 weeks.  The pain is migratory.  At times it is between her shoulder blades but sounds in her lower back.  The pain is constant.  There is no exacerbating or alleviating factors.  However she has now developed severe abdominal pain.  She is tender to palpation in all 4 abdominal quadrants with voluntary and and involuntary guarding.  She grabs my hand when I palpate her abdomen due to the pain to try to make..  She has hyperactive bowel sounds.  She states that she is "constipated".  She is only been able to have small bowel movements over the last few days that are incomplete.  She denies any melena or hematochezia.  She denies any nausea or vomiting.  She denies any dysuria or hematuria. Past Medical History:  Diagnosis Date   Anxiety    Arterial thrombosis (HCC)    right sfa, popliteal artery.  Require thromectomy at [facility] and lifelong anticoagulation.   Cancer (HCC)    ovarian, recurrent on chemo (carboplatin, doxorubicin) at [facility]   Colon polyp    Depression    Full dentures    GERD (gastroesophageal reflux disease)    HLD (hyperlipidemia) 12/03/2012   Hypertension    Hypothyroidism    IBS (irritable bowel syndrome)    Neck mass    Nephrostomy status (HCC)    Neuropathy    feet   Rheumatoid arthritis (HCC)    at [facility]    Wears glasses    Past Surgical History:  Procedure Laterality Date   ABDOMINAL HYSTERECTOMY     ABLATION     COLONOSCOPY     CYST REMOVAL NECK  05/13/12   EAR CYST  EXCISION  05/13/2012   Procedure: CYST REMOVAL;  Surgeon: Axel Filler, MD;  Location: [facility] OR;  Service: General;  Laterality: Left;  excision of neck cyst   IR NEPHROSTOMY EXCHANGE LEFT  09/02/2021   THYROID SURGERY  10/2011 - approximate   ablation   TONSILLECTOMY     UPPER GI ENDOSCOPY     Current Outpatient Medications on File Prior to Visit  Medication Sig Dispense Refill   albuterol (VENTOLIN HFA) 108 (90 Base) MCG/ACT inhaler Inhale 2 puffs into the lungs every 6 (six) hours as needed for wheezing or shortness of breath. 8 g 0   ALPRAZolam (XANAX) 0.5 MG tablet Take 1 tablet (0.5 mg total) by mouth 3 (three) times daily as needed. (Patient taking differently: Take 0.5 mg by mouth 3 (three) times daily as needed for anxiety.) 30 tablet 0   amLODipine (NORVASC) 10 MG tablet Take 1 tablet by mouth once daily 90 tablet 0   aspirin EC 81 MG tablet Take 81 mg by mouth daily. Swallow whole.     atorvastatin (LIPITOR) 80 MG tablet TAKE 1 TABLET BY MOUTH ONCE DAILY ---PT  NEED  CPE  APPT  W/PCP  FOR  FURTHER  REFILLS 90 tablet 0  calcium carbonate (OS-CAL - DOSED IN MG OF ELEMENTAL CALCIUM) 1250 (500 Ca) MG tablet Take 1 tablet (500 mg of elemental calcium total) by mouth 3 (three) times daily with meals. (Patient taking differently: Take 1 tablet by mouth daily with breakfast.) 90 tablet 0   cephALEXin (KEFLEX) 500 MG capsule Take 1 capsule (500 mg total) by mouth 3 (three) times daily. 21 capsule 0   Cholecalciferol (VITAMIN D3) 50 MCG (2000 UT) capsule Take 2,000 Units by mouth daily.     cyclophosphamide (CYTOXAN) 50 MG capsule Take 50mg  (1 capsule) by mouth daily every morning with a full glass of water.     gabapentin (NEURONTIN) 300 MG capsule TAKE 1 CAPSULE TWICE A DAY 180 capsule 3   leflunomide (ARAVA) 20 MG tablet Take 20 mg by mouth daily.     levothyroxine (SYNTHROID) 175 MCG tablet TAKE 1 TABLET DAILY BEFORE BREAKFAST (NEED APPOINTMENT TO CHECK TSH AS SOON AS POSSIBLE) 90 tablet 3    losartan (COZAAR) 50 MG tablet Take 1 tablet by mouth once daily 90 tablet 0   MAGNESIUM PO Take 800 mg by mouth daily.     neomycin-polymyxin-hydrocortisone (CORTISPORIN) OTIC solution Place 3 drops into the right ear 4 (four) times daily. 10 mL 0   omeprazole (PRILOSEC) 40 MG capsule Take 1 capsule (40 mg total) by mouth in the morning and at bedtime. (Patient taking differently: Take 40 mg by mouth daily as needed (acid reflux / GERD).) 60 capsule 3   ondansetron (ZOFRAN) 8 MG tablet Take 8 mg by mouth daily as needed for nausea, vomiting or refractory nausea / vomiting.     oxyCODONE (OXY IR/ROXICODONE) 5 MG immediate release tablet Take 1 tablet (5 mg total) by mouth 4 (four) times daily as needed for moderate pain. 60 tablet 0   potassium chloride SA (KLOR-CON M) 20 MEQ tablet TAKE 2  BY MOUTH ONCE DAILY 180 tablet 3   PREDNISOLONE ACETATE OP Place 1 drop into both eyes See admin instructions. Take 1 drop both eyes on the day before, day of and 7 days after chemotherapy     vitamin B-12 (CYANOCOBALAMIN) 1000 MCG tablet Take 1,000 mcg by mouth daily.     warfarin (COUMADIN) 5 MG tablet TAKE ONE AND ONE-HALF TABLETS ONCE DAILY ON TUESDAY, THURSDAY, SATURDAY AND SUNDAY, THEN TAKE 2 TABLETS ONCE DAILY ON OTHER DAYS 135 tablet 3   No current facility-administered medications on file prior to visit.   Allergies  Allergen Reactions   Paclitaxel Other (See Comments)    Flushing, Itching, Throat tightness   Social History   Socioeconomic History   Marital status: Single    Spouse name: Not on file   Number of children: Not on file   Years of education: Not on file   Highest education level: Not on file  Occupational History   Not on file  Tobacco Use   Smoking status: Former    Years: 32    Types: Cigarettes    Quit date: 06/19/2011    Years since quitting: 11.3   Smokeless tobacco: Never  Vaping Use   Vaping Use: Never used  Substance and Sexual Activity   Alcohol use: No    Drug use: No   Sexual activity: Not on file  Other Topics Concern   Not on file  Social History Narrative   Not on file   Social Determinants of Health   Financial Resource Strain: Low Risk  (10/31/2021)   Overall Financial Resource Strain (  CARDIA)    Difficulty of Paying Living Expenses: Not hard at all  Food Insecurity: No Food Insecurity (10/31/2021)   Hunger Vital Sign    Worried About Running Out of Food in the Last Year: Never true    Ran Out of Food in the Last Year: Never true  Transportation Needs: No Transportation Needs (10/31/2021)   PRAPARE - Administrator, Civil Service (Medical): No    Lack of Transportation (Non-Medical): No  Physical Activity: Insufficiently Active (10/31/2021)   Exercise Vital Sign    Days of Exercise per Week: 3 days    Minutes of Exercise per Session: 20 min  Stress: No Stress Concern Present (10/31/2021)   [employer] of Occupational Health - Occupational Stress Questionnaire    Feeling of Stress : Only a little  Social Connections: Moderately Integrated (10/31/2021)   Social Connection and Isolation Panel [NHANES]    Frequency of Communication with Friends and Family: More than three times a week    Frequency of Social Gatherings with Friends and Family: Twice a week    Attends Religious Services: More than 4 times per year    Active Member of [employer] or Organizations: Yes    Attends Banker Meetings: More than 4 times per year    Marital Status: Never married  Intimate Partner Violence: Not At Risk (10/31/2021)   Humiliation, Afraid, Rape, and Kick questionnaire    Fear of Current or Ex-Partner: No    Emotionally Abused: No    Physically Abused: No    Sexually Abused: No      Review of Systems  All other systems reviewed and are negative.      Objective:   Physical Exam Vitals reviewed.  Constitutional:      General: She is in acute distress.     Appearance: She is well-developed and normal weight.  She is ill-appearing. She is not toxic-appearing.  HENT:     Right Ear: There is impacted cerumen. Tympanic membrane is not perforated, erythematous, retracted or bulging.  Cardiovascular:     Rate and Rhythm: Regular rhythm. Tachycardia present.     Heart sounds: Normal heart sounds.  Pulmonary:     Effort: Pulmonary effort is normal. Tachypnea present. No accessory muscle usage or respiratory distress.     Breath sounds: No stridor. No decreased breath sounds, wheezing, rhonchi or rales.  Abdominal:     Palpations: Abdomen is soft.     Tenderness: There is abdominal tenderness. There is guarding and rebound.  Musculoskeletal:     Lumbar back: Tenderness present. Decreased range of motion.     Right lower leg: No edema.     Left lower leg: No edema.  Neurological:     Mental Status: She is alert.           Assessment & Plan:  Other chest pain - Plan: EKG 12-Lead, Hemoglobin, fingerstick  Acute abdomen EKG today shows normal sinus rhythm with nonspecific ST changes in the inferior leads.  Is not 2.3.  She also has poor R wave progression in the precordial leads V2 through V4.  This is also chronic.  Her hemoglobin is improved.  Therefore I do not feel that her chest pain is due to severe emergent anemia.  However the patient has an acute abdomen.  Given her past medical history of concerned about a partial bowel obstruction versus an arterial thrombus in one of the mesenteric arteries.  I feel that the patient needs emergent  imaging of the abdomen and pelvis.  She is unable to drive due to acute pain.  Therefore we will call 911.

## 2022-10-08 LAB — CULTURE, BLOOD (ROUTINE X 2)

## 2022-10-09 LAB — CULTURE, BLOOD (ROUTINE X 2)
Culture: NO GROWTH
Special Requests: ADEQUATE

## 2022-10-09 LAB — BODY FLUID CULTURE W GRAM STAIN

## 2022-10-09 LAB — ANAEROBIC CULTURE W GRAM STAIN

## 2022-10-09 LAB — PATHOLOGIST SMEAR REVIEW

## 2022-10-10 LAB — BODY FLUID CULTURE W GRAM STAIN

## 2022-10-11 LAB — CULTURE, BLOOD (ROUTINE X 2): Special Requests: ADEQUATE

## 2022-10-11 LAB — ANAEROBIC CULTURE W GRAM STAIN

## 2022-10-11 LAB — BODY FLUID CULTURE W GRAM STAIN

## 2022-10-12 ENCOUNTER — Telehealth (HOSPITAL_BASED_OUTPATIENT_CLINIC_OR_DEPARTMENT_OTHER): Payer: Self-pay | Admitting: *Deleted

## 2022-10-12 DIAGNOSIS — Z87891 Personal history of nicotine dependence: Secondary | ICD-10-CM | POA: Diagnosis not present

## 2022-10-12 DIAGNOSIS — B957 Other staphylococcus as the cause of diseases classified elsewhere: Secondary | ICD-10-CM | POA: Diagnosis not present

## 2022-10-12 DIAGNOSIS — K219 Gastro-esophageal reflux disease without esophagitis: Secondary | ICD-10-CM | POA: Diagnosis not present

## 2022-10-12 DIAGNOSIS — J90 Pleural effusion, not elsewhere classified: Secondary | ICD-10-CM | POA: Diagnosis not present

## 2022-10-12 DIAGNOSIS — B9561 Methicillin susceptible Staphylococcus aureus infection as the cause of diseases classified elsewhere: Secondary | ICD-10-CM | POA: Diagnosis not present

## 2022-10-12 DIAGNOSIS — R197 Diarrhea, unspecified: Secondary | ICD-10-CM | POA: Diagnosis not present

## 2022-10-12 DIAGNOSIS — Z79890 Hormone replacement therapy (postmenopausal): Secondary | ICD-10-CM | POA: Diagnosis not present

## 2022-10-12 DIAGNOSIS — E876 Hypokalemia: Secondary | ICD-10-CM | POA: Diagnosis not present

## 2022-10-12 DIAGNOSIS — R1111 Vomiting without nausea: Secondary | ICD-10-CM | POA: Diagnosis not present

## 2022-10-12 DIAGNOSIS — I10 Essential (primary) hypertension: Secondary | ICD-10-CM | POA: Diagnosis not present

## 2022-10-12 DIAGNOSIS — A4902 Methicillin resistant Staphylococcus aureus infection, unspecified site: Secondary | ICD-10-CM | POA: Diagnosis not present

## 2022-10-12 DIAGNOSIS — C569 Malignant neoplasm of unspecified ovary: Secondary | ICD-10-CM | POA: Diagnosis not present

## 2022-10-12 DIAGNOSIS — E039 Hypothyroidism, unspecified: Secondary | ICD-10-CM | POA: Diagnosis not present

## 2022-10-12 DIAGNOSIS — I498 Other specified cardiac arrhythmias: Secondary | ICD-10-CM | POA: Diagnosis not present

## 2022-10-12 DIAGNOSIS — K659 Peritonitis, unspecified: Secondary | ICD-10-CM | POA: Diagnosis not present

## 2022-10-12 DIAGNOSIS — Z7982 Long term (current) use of aspirin: Secondary | ICD-10-CM | POA: Diagnosis not present

## 2022-10-12 DIAGNOSIS — C562 Malignant neoplasm of left ovary: Secondary | ICD-10-CM | POA: Diagnosis not present

## 2022-10-12 DIAGNOSIS — A419 Sepsis, unspecified organism: Secondary | ICD-10-CM | POA: Diagnosis not present

## 2022-10-12 DIAGNOSIS — C800 Disseminated malignant neoplasm, unspecified: Secondary | ICD-10-CM | POA: Diagnosis not present

## 2022-10-12 DIAGNOSIS — M545 Low back pain, unspecified: Secondary | ICD-10-CM | POA: Diagnosis not present

## 2022-10-12 DIAGNOSIS — Z978 Presence of other specified devices: Secondary | ICD-10-CM | POA: Diagnosis not present

## 2022-10-12 DIAGNOSIS — R1084 Generalized abdominal pain: Secondary | ICD-10-CM | POA: Diagnosis not present

## 2022-10-12 DIAGNOSIS — M546 Pain in thoracic spine: Secondary | ICD-10-CM | POA: Diagnosis not present

## 2022-10-12 DIAGNOSIS — Z7901 Long term (current) use of anticoagulants: Secondary | ICD-10-CM | POA: Diagnosis not present

## 2022-10-12 DIAGNOSIS — R918 Other nonspecific abnormal finding of lung field: Secondary | ICD-10-CM | POA: Diagnosis not present

## 2022-10-12 DIAGNOSIS — Z4682 Encounter for fitting and adjustment of non-vascular catheter: Secondary | ICD-10-CM | POA: Diagnosis not present

## 2022-10-12 DIAGNOSIS — R188 Other ascites: Secondary | ICD-10-CM | POA: Diagnosis not present

## 2022-10-12 DIAGNOSIS — Z79634 Long term (current) use of topoisomerase inhibitor: Secondary | ICD-10-CM | POA: Diagnosis not present

## 2022-10-12 DIAGNOSIS — K449 Diaphragmatic hernia without obstruction or gangrene: Secondary | ICD-10-CM | POA: Diagnosis not present

## 2022-10-12 DIAGNOSIS — E785 Hyperlipidemia, unspecified: Secondary | ICD-10-CM | POA: Diagnosis not present

## 2022-10-12 DIAGNOSIS — M0600 Rheumatoid arthritis without rheumatoid factor, unspecified site: Secondary | ICD-10-CM | POA: Diagnosis not present

## 2022-10-12 DIAGNOSIS — R231 Pallor: Secondary | ICD-10-CM | POA: Diagnosis not present

## 2022-10-12 DIAGNOSIS — A4102 Sepsis due to Methicillin resistant Staphylococcus aureus: Secondary | ICD-10-CM | POA: Diagnosis not present

## 2022-10-12 LAB — ANAEROBIC CULTURE W GRAM STAIN

## 2022-10-12 LAB — CULTURE, BLOOD (ROUTINE X 2): Culture: NO GROWTH

## 2022-10-12 NOTE — Progress Notes (Signed)
ED Antimicrobial Stewardship Positive Culture Follow Up   Donna [name] is an 62 y.o. female who presented to [facility] on 10/07/2022 with a chief complaint of  Chief Complaint  Patient presents with   Abdominal Pain   Back Pain    Recent Results (from the past 720 hour(s))  Blood culture (routine x 2)     Status: None   Collection Time: 10/07/22  5:38 PM   Specimen: BLOOD LEFT ARM  Result Value Ref Range Status   Specimen Description BLOOD LEFT ARM  Final   Special Requests   Final    BOTTLES DRAWN AEROBIC AND ANAEROBIC Blood Culture adequate volume   Culture   Final    NO GROWTH 5 DAYS Performed at [facility], [address], [place], [place] [address]    Report Status 10/12/2022 FINAL  Final  Blood culture (routine x 2)     Status: None   Collection Time: 10/07/22  6:50 PM   Specimen: BLOOD  Result Value Ref Range Status   Specimen Description BLOOD SITE NOT SPECIFIED  Final   Special Requests   Final    BOTTLES DRAWN AEROBIC AND ANAEROBIC Blood Culture adequate volume   Culture   Final    NO GROWTH 5 DAYS Performed at [facility], [address], [place], [place] [address]    Report Status 10/12/2022 FINAL  Final  Body fluid culture w Gram Stain     Status: None   Collection Time: 10/07/22  7:10 PM   Specimen: Body Fluid  Result Value Ref Range Status   Specimen Description FLUID PERITONEAL  Final   Special Requests NONE  Final   Gram Stain   Final    RARE WBC PRESENT, PREDOMINANTLY MONONUCLEAR NO ORGANISMS SEEN Performed at [facility], [address], [place], [place] [address]    Culture   Final    RARE STAPHYLOCOCCUS AUREUS CRITICAL RESULT CALLED TO, READ BACK BY AND VERIFIED WITH: ED CHARGE NURSE Dorothea Glassman [id number] AT 1236 BY CM RARE STAPHYLOCOCCUS EPIDERMIDIS    Report Status 10/11/2022 FINAL  Final   Organism ID, Bacteria STAPHYLOCOCCUS AUREUS  Final   Organism ID, Bacteria STAPHYLOCOCCUS EPIDERMIDIS  Final      Susceptibility    Staphylococcus aureus - MIC*    CIPROFLOXACIN <=0.5 SENSITIVE Sensitive     ERYTHROMYCIN >=8 RESISTANT Resistant     GENTAMICIN <=0.5 SENSITIVE Sensitive     OXACILLIN <=0.25 SENSITIVE Sensitive     TETRACYCLINE <=1 SENSITIVE Sensitive     VANCOMYCIN <=0.5 SENSITIVE Sensitive     TRIMETH/SULFA <=10 SENSITIVE Sensitive     CLINDAMYCIN RESISTANT Resistant     RIFAMPIN <=0.5 SENSITIVE Sensitive     Inducible Clindamycin POSITIVE Resistant     LINEZOLID 2 SENSITIVE Sensitive     * RARE STAPHYLOCOCCUS AUREUS   Staphylococcus epidermidis - MIC*    CIPROFLOXACIN 4 RESISTANT Resistant     ERYTHROMYCIN >=8 RESISTANT Resistant     GENTAMICIN 4 SENSITIVE Sensitive     OXACILLIN >=4 RESISTANT Resistant     TETRACYCLINE >=16 RESISTANT Resistant     VANCOMYCIN 2 SENSITIVE Sensitive     TRIMETH/SULFA 80 RESISTANT Resistant     CLINDAMYCIN RESISTANT Resistant     RIFAMPIN <=0.5 SENSITIVE Sensitive     Inducible Clindamycin POSITIVE Resistant     * RARE STAPHYLOCOCCUS EPIDERMIDIS  Anaerobic culture w Gram Stain     Status: None (Preliminary result)   Collection  Time: 10/07/22  7:10 PM   Specimen: Fluid  Result Value Ref Range Status   Specimen Description FLUID PERITONEAL  Final   Special Requests NONE  Final   Gram Stain   Final    FEW WBC SEEN NO ORGANISMS SEEN Performed at [facility], [address], [place], [place] [address]    Culture   Final    NO ANAEROBES ISOLATED; CULTURE IN PROGRESS FOR 5 DAYS   Report Status PENDING  Incomplete    [x]  Patient currently being seen at [facility] for peritonitis. Chart review reveals that they are aware of the above currently. Patient initiated on zosyn and vancomycin there. No further action is required.  Delmar Landau, PharmD, BCPS 10/12/2022 11:14 AM ED Clinical Pharmacist -  [phone]

## 2022-10-12 NOTE — Telephone Encounter (Signed)
Post ED Visit - Positive Culture Follow-up  Culture report reviewed by antimicrobial stewardship pharmacist: [facility] []  Enzo Bi, Pharm.D. []  Celedonio Miyamoto, Pharm.D., BCPS AQ-ID []  Garvin Fila, Pharm.D., BCPS []  Georgina Pillion, Pharm.D., BCPS []  [place], [place].D., BCPS, AAHIVP []  Estella Husk, Pharm.D., BCPS, AAHIVP []  Lysle Pearl, PharmD, BCPS []  Phillips Climes, PharmD, BCPS []  Agapito Games, PharmD, BCPS []  Verlan Friends, PharmD []  Mervyn Gay, PharmD, BCPS [x]  Delmar Landau PharmD  [facility] []  Len Childs, PharmD []  Greer Pickerel, PharmD []  Adalberto Cole, PharmD []  Perlie Gold, Rph []  Lonell Face) Jean Rosenthal, PharmD []  Earl Many, PharmD []  Junita Push, PharmD []  Dorna Leitz, PharmD []  Terrilee Files, PharmD []  Lynann Beaver, PharmD []  Keturah Barre, PharmD []  Loralee Pacas, PharmD []  Bernadene Person, PharmD   Positive body fluid culture Pt being treated for same at [facility]. PT on vanc/zosyn. No action needed [name] 10/12/2022, 1:04 PM

## 2022-10-13 DIAGNOSIS — I10 Essential (primary) hypertension: Secondary | ICD-10-CM | POA: Diagnosis not present

## 2022-10-13 DIAGNOSIS — C562 Malignant neoplasm of left ovary: Secondary | ICD-10-CM | POA: Diagnosis not present

## 2022-10-13 DIAGNOSIS — E039 Hypothyroidism, unspecified: Secondary | ICD-10-CM | POA: Diagnosis not present

## 2022-10-13 DIAGNOSIS — K659 Peritonitis, unspecified: Secondary | ICD-10-CM | POA: Diagnosis not present

## 2022-10-13 DIAGNOSIS — A419 Sepsis, unspecified organism: Secondary | ICD-10-CM | POA: Diagnosis not present

## 2022-10-13 DIAGNOSIS — A4902 Methicillin resistant Staphylococcus aureus infection, unspecified site: Secondary | ICD-10-CM | POA: Diagnosis not present

## 2022-10-14 DIAGNOSIS — Z4682 Encounter for fitting and adjustment of non-vascular catheter: Secondary | ICD-10-CM | POA: Diagnosis not present

## 2022-10-14 DIAGNOSIS — K659 Peritonitis, unspecified: Secondary | ICD-10-CM | POA: Diagnosis not present

## 2022-10-14 DIAGNOSIS — I498 Other specified cardiac arrhythmias: Secondary | ICD-10-CM | POA: Diagnosis not present

## 2022-10-14 DIAGNOSIS — B957 Other staphylococcus as the cause of diseases classified elsewhere: Secondary | ICD-10-CM | POA: Diagnosis not present

## 2022-10-14 DIAGNOSIS — Z79634 Long term (current) use of topoisomerase inhibitor: Secondary | ICD-10-CM | POA: Diagnosis not present

## 2022-10-14 DIAGNOSIS — C800 Disseminated malignant neoplasm, unspecified: Secondary | ICD-10-CM | POA: Diagnosis not present

## 2022-10-14 DIAGNOSIS — B9561 Methicillin susceptible Staphylococcus aureus infection as the cause of diseases classified elsewhere: Secondary | ICD-10-CM | POA: Diagnosis not present

## 2022-10-14 DIAGNOSIS — Z978 Presence of other specified devices: Secondary | ICD-10-CM | POA: Diagnosis not present

## 2022-10-14 DIAGNOSIS — C569 Malignant neoplasm of unspecified ovary: Secondary | ICD-10-CM | POA: Diagnosis not present

## 2022-10-15 DIAGNOSIS — K659 Peritonitis, unspecified: Secondary | ICD-10-CM | POA: Diagnosis not present

## 2022-10-15 DIAGNOSIS — Z978 Presence of other specified devices: Secondary | ICD-10-CM | POA: Diagnosis not present

## 2022-10-15 DIAGNOSIS — B957 Other staphylococcus as the cause of diseases classified elsewhere: Secondary | ICD-10-CM | POA: Diagnosis not present

## 2022-10-15 DIAGNOSIS — C569 Malignant neoplasm of unspecified ovary: Secondary | ICD-10-CM | POA: Diagnosis not present

## 2022-10-15 DIAGNOSIS — Z79634 Long term (current) use of topoisomerase inhibitor: Secondary | ICD-10-CM | POA: Diagnosis not present

## 2022-10-15 DIAGNOSIS — B9561 Methicillin susceptible Staphylococcus aureus infection as the cause of diseases classified elsewhere: Secondary | ICD-10-CM | POA: Diagnosis not present

## 2022-10-15 DIAGNOSIS — C800 Disseminated malignant neoplasm, unspecified: Secondary | ICD-10-CM | POA: Diagnosis not present

## 2022-10-16 DIAGNOSIS — R188 Other ascites: Secondary | ICD-10-CM | POA: Diagnosis not present

## 2022-10-16 DIAGNOSIS — M546 Pain in thoracic spine: Secondary | ICD-10-CM | POA: Diagnosis not present

## 2022-10-16 DIAGNOSIS — K659 Peritonitis, unspecified: Secondary | ICD-10-CM | POA: Diagnosis not present

## 2022-10-16 DIAGNOSIS — C569 Malignant neoplasm of unspecified ovary: Secondary | ICD-10-CM | POA: Diagnosis not present

## 2022-10-17 DIAGNOSIS — K659 Peritonitis, unspecified: Secondary | ICD-10-CM | POA: Diagnosis not present

## 2022-10-18 DIAGNOSIS — K659 Peritonitis, unspecified: Secondary | ICD-10-CM | POA: Diagnosis not present

## 2022-10-19 DIAGNOSIS — K659 Peritonitis, unspecified: Secondary | ICD-10-CM | POA: Diagnosis not present

## 2022-10-21 DIAGNOSIS — D649 Anemia, unspecified: Secondary | ICD-10-CM | POA: Diagnosis not present

## 2022-10-21 DIAGNOSIS — J90 Pleural effusion, not elsewhere classified: Secondary | ICD-10-CM | POA: Diagnosis not present

## 2022-10-21 DIAGNOSIS — Z5181 Encounter for therapeutic drug level monitoring: Secondary | ICD-10-CM | POA: Diagnosis not present

## 2022-10-21 DIAGNOSIS — C563 Malignant neoplasm of bilateral ovaries: Secondary | ICD-10-CM | POA: Diagnosis not present

## 2022-10-21 DIAGNOSIS — K659 Peritonitis, unspecified: Secondary | ICD-10-CM | POA: Diagnosis not present

## 2022-10-21 DIAGNOSIS — C786 Secondary malignant neoplasm of retroperitoneum and peritoneum: Secondary | ICD-10-CM | POA: Diagnosis not present

## 2022-10-21 DIAGNOSIS — E878 Other disorders of electrolyte and fluid balance, not elsewhere classified: Secondary | ICD-10-CM | POA: Diagnosis not present

## 2022-10-21 DIAGNOSIS — K2289 Other specified disease of esophagus: Secondary | ICD-10-CM | POA: Diagnosis not present

## 2022-10-21 DIAGNOSIS — Z7901 Long term (current) use of anticoagulants: Secondary | ICD-10-CM | POA: Diagnosis not present

## 2022-10-23 DIAGNOSIS — E878 Other disorders of electrolyte and fluid balance, not elsewhere classified: Secondary | ICD-10-CM | POA: Diagnosis not present

## 2022-10-23 DIAGNOSIS — D649 Anemia, unspecified: Secondary | ICD-10-CM | POA: Diagnosis not present

## 2022-10-24 DIAGNOSIS — C787 Secondary malignant neoplasm of liver and intrahepatic bile duct: Secondary | ICD-10-CM | POA: Diagnosis not present

## 2022-10-24 DIAGNOSIS — E039 Hypothyroidism, unspecified: Secondary | ICD-10-CM | POA: Diagnosis not present

## 2022-10-24 DIAGNOSIS — C563 Malignant neoplasm of bilateral ovaries: Secondary | ICD-10-CM | POA: Diagnosis not present

## 2022-10-24 DIAGNOSIS — Z86718 Personal history of other venous thrombosis and embolism: Secondary | ICD-10-CM | POA: Diagnosis not present

## 2022-10-24 DIAGNOSIS — Z79634 Long term (current) use of topoisomerase inhibitor: Secondary | ICD-10-CM | POA: Diagnosis not present

## 2022-10-24 DIAGNOSIS — Z79899 Other long term (current) drug therapy: Secondary | ICD-10-CM | POA: Diagnosis not present

## 2022-10-24 DIAGNOSIS — Z5111 Encounter for antineoplastic chemotherapy: Secondary | ICD-10-CM | POA: Diagnosis not present

## 2022-10-24 DIAGNOSIS — D630 Anemia in neoplastic disease: Secondary | ICD-10-CM | POA: Diagnosis not present

## 2022-10-24 DIAGNOSIS — M87876 Other osteonecrosis, unspecified foot: Secondary | ICD-10-CM | POA: Diagnosis not present

## 2022-10-24 DIAGNOSIS — G629 Polyneuropathy, unspecified: Secondary | ICD-10-CM | POA: Diagnosis not present

## 2022-10-24 DIAGNOSIS — R180 Malignant ascites: Secondary | ICD-10-CM | POA: Diagnosis not present

## 2022-10-24 DIAGNOSIS — I10 Essential (primary) hypertension: Secondary | ICD-10-CM | POA: Diagnosis not present

## 2022-10-24 DIAGNOSIS — Z7901 Long term (current) use of anticoagulants: Secondary | ICD-10-CM | POA: Diagnosis not present

## 2022-10-24 DIAGNOSIS — R5383 Other fatigue: Secondary | ICD-10-CM | POA: Diagnosis not present

## 2022-10-24 DIAGNOSIS — R110 Nausea: Secondary | ICD-10-CM | POA: Diagnosis not present

## 2022-10-24 DIAGNOSIS — C800 Disseminated malignant neoplasm, unspecified: Secondary | ICD-10-CM | POA: Diagnosis not present

## 2022-11-04 DIAGNOSIS — C800 Disseminated malignant neoplasm, unspecified: Secondary | ICD-10-CM | POA: Diagnosis not present

## 2022-11-04 DIAGNOSIS — Z79634 Long term (current) use of topoisomerase inhibitor: Secondary | ICD-10-CM | POA: Diagnosis not present

## 2022-11-04 DIAGNOSIS — K659 Peritonitis, unspecified: Secondary | ICD-10-CM | POA: Diagnosis not present

## 2022-11-04 DIAGNOSIS — Z978 Presence of other specified devices: Secondary | ICD-10-CM | POA: Diagnosis not present

## 2022-11-04 DIAGNOSIS — B961 Klebsiella pneumoniae [K. pneumoniae] as the cause of diseases classified elsewhere: Secondary | ICD-10-CM | POA: Diagnosis not present

## 2022-11-04 DIAGNOSIS — C569 Malignant neoplasm of unspecified ovary: Secondary | ICD-10-CM | POA: Diagnosis not present

## 2022-11-04 DIAGNOSIS — B957 Other staphylococcus as the cause of diseases classified elsewhere: Secondary | ICD-10-CM | POA: Diagnosis not present

## 2022-11-06 DIAGNOSIS — Z01812 Encounter for preprocedural laboratory examination: Secondary | ICD-10-CM | POA: Diagnosis not present

## 2022-11-06 DIAGNOSIS — R180 Malignant ascites: Secondary | ICD-10-CM | POA: Diagnosis not present

## 2022-11-06 DIAGNOSIS — K659 Peritonitis, unspecified: Secondary | ICD-10-CM | POA: Diagnosis not present

## 2022-11-06 DIAGNOSIS — R188 Other ascites: Secondary | ICD-10-CM | POA: Diagnosis not present

## 2022-11-07 DIAGNOSIS — Z9221 Personal history of antineoplastic chemotherapy: Secondary | ICD-10-CM | POA: Diagnosis not present

## 2022-11-07 DIAGNOSIS — C800 Disseminated malignant neoplasm, unspecified: Secondary | ICD-10-CM | POA: Diagnosis not present

## 2022-11-07 DIAGNOSIS — C786 Secondary malignant neoplasm of retroperitoneum and peritoneum: Secondary | ICD-10-CM | POA: Diagnosis not present

## 2022-11-07 DIAGNOSIS — C563 Malignant neoplasm of bilateral ovaries: Secondary | ICD-10-CM | POA: Diagnosis not present

## 2022-11-07 NOTE — Progress Notes (Signed)
[facility] Quality Team Note  Name: Donna [name] Date of Birth: [birth year] MRN: [id number] Date: 11/07/2022  [facility] Quality Team has reviewed this patient's chart, please see recommendations below:  [facility] Quality Other; (TRANSITIONS OF CARE POST DISCHARGE. PATIENT RECENTLY DISCHARGED FROM HOSPITAL ON 10/19/2022. PATIENT NEEDS A MEDICATION RECONCILIATION BY A PRESCRIBING PROVIDER BEFORE 11/18/2022. DOCUMENTATION MUST MENTION HOSPITALIZATION FOR COMPLIANCE)

## 2022-11-13 ENCOUNTER — Telehealth: Payer: Self-pay

## 2022-11-13 ENCOUNTER — Ambulatory Visit (INDEPENDENT_AMBULATORY_CARE_PROVIDER_SITE_OTHER): Payer: Medicare (Managed Care)

## 2022-11-13 VITALS — Ht 67.0 in | Wt 203.0 lb

## 2022-11-13 DIAGNOSIS — Z0000 Encounter for general adult medical examination without abnormal findings: Secondary | ICD-10-CM

## 2022-11-13 NOTE — Progress Notes (Signed)
Subjective:   Donna [name] is a 62 y.o. female who presents for Medicare Annual (Subsequent) preventive examination.  Visit Complete: Virtual  I connected with  Donna [name] on 11/13/22 by a audio enabled telemedicine application and verified that I am speaking with the correct person using two identifiers.  Patient Location: Home  Provider Location: Home Office  I discussed the limitations of evaluation and management by telemedicine. The patient expressed understanding and agreed to proceed.  Patient Medicare AWV questionnaire was completed by the patient on 11/10/22; I have confirmed that all information answered by patient is correct and no changes since this date.  Review of Systems     Cardiac Risk Factors include: dyslipidemia;hypertension;sedentary lifestyle     Objective:    Today's Vitals   11/13/22 1415  Weight: 203 lb (92.1 kg)  Height: 5\' 7"  (1.702 m)   Body mass index is 31.79 kg/m.     11/13/2022    2:40 PM 10/07/2022    3:06 PM 10/31/2021   11:15 AM 10/19/2021    3:16 PM 08/15/2020    3:08 AM 08/15/2014    6:50 AM 05/13/2012   11:11 AM  Advanced Directives  Does Patient Have a Medical Advance Directive? No No No No No No   Would patient like information on creating a medical advance directive? Yes (MAU/Ambulatory/Procedural Areas - Information given) No - Patient declined No - Patient declined No - Patient declined No - Patient declined No - patient declined information   Pre-existing out of facility DNR order (yellow form or pink MOST form)       No    Current Medications (verified) Outpatient Encounter Medications as of 11/13/2022  Medication Sig   ALPRAZolam (XANAX) 0.5 MG tablet Take 1 tablet (0.5 mg total) by mouth 3 (three) times daily as needed. (Patient taking differently: Take 0.5 mg by mouth 3 (three) times daily as needed for anxiety.)   amLODipine (NORVASC) 10 MG tablet Take 1 tablet by mouth once daily   aspirin EC 81 MG tablet Take 81 mg by  mouth daily. Swallow whole.   atorvastatin (LIPITOR) 80 MG tablet TAKE 1 TABLET BY MOUTH ONCE DAILY ---PT  NEED  CPE  APPT  W/PCP  FOR  FURTHER  REFILLS   calcium carbonate (OS-CAL - DOSED IN MG OF ELEMENTAL CALCIUM) 1250 (500 Ca) MG tablet Take 1 tablet (500 mg of elemental calcium total) by mouth 3 (three) times daily with meals. (Patient taking differently: Take 1 tablet by mouth daily with breakfast.)   Cholecalciferol (VITAMIN D3) 50 MCG (2000 UT) capsule Take 2,000 Units by mouth daily.   gabapentin (NEURONTIN) 300 MG capsule TAKE 1 CAPSULE TWICE A DAY   leflunomide (ARAVA) 20 MG tablet Take 20 mg by mouth daily.   levothyroxine (SYNTHROID) 175 MCG tablet TAKE 1 TABLET DAILY BEFORE BREAKFAST (NEED APPOINTMENT TO CHECK TSH AS SOON AS POSSIBLE)   losartan (COZAAR) 50 MG tablet Take 1 tablet by mouth once daily   MAGNESIUM PO Take 800 mg by mouth daily.   omeprazole (PRILOSEC) 40 MG capsule Take 1 capsule (40 mg total) by mouth in the morning and at bedtime. (Patient taking differently: Take 40 mg by mouth daily as needed (acid reflux / GERD).)   ondansetron (ZOFRAN) 8 MG tablet Take 8 mg by mouth daily as needed for nausea, vomiting or refractory nausea / vomiting.   oxyCODONE (OXY IR/ROXICODONE) 5 MG immediate release tablet Take 1 tablet (5 mg total) by mouth  4 (four) times daily as needed for moderate pain.   potassium chloride SA (KLOR-CON M) 20 MEQ tablet TAKE 2  BY MOUTH ONCE DAILY   vitamin B-12 (CYANOCOBALAMIN) 1000 MCG tablet Take 1,000 mcg by mouth daily.   warfarin (COUMADIN) 5 MG tablet TAKE ONE AND ONE-HALF TABLETS ONCE DAILY ON TUESDAY, THURSDAY, SATURDAY AND SUNDAY, THEN TAKE 2 TABLETS ONCE DAILY ON OTHER DAYS   [DISCONTINUED] albuterol (VENTOLIN HFA) 108 (90 Base) MCG/ACT inhaler Inhale 2 puffs into the lungs every 6 (six) hours as needed for wheezing or shortness of breath.   [DISCONTINUED] cephALEXin (KEFLEX) 500 MG capsule Take 1 capsule (500 mg total) by mouth 3 (three) times  daily.   [DISCONTINUED] cyclophosphamide (CYTOXAN) 50 MG capsule Take 50mg  (1 capsule) by mouth daily every morning with a full glass of water.   [DISCONTINUED] neomycin-polymyxin-hydrocortisone (CORTISPORIN) OTIC solution Place 3 drops into the right ear 4 (four) times daily.   [DISCONTINUED] PREDNISOLONE ACETATE OP Place 1 drop into both eyes See admin instructions. Take 1 drop both eyes on the day before, day of and 7 days after chemotherapy   No facility-administered encounter medications on file as of 11/13/2022.    Allergies (verified) Paclitaxel   History: Past Medical History:  Diagnosis Date   Anxiety    Arterial thrombosis (HCC)    right sfa, popliteal artery.  Require thromectomy at [facility] and lifelong anticoagulation.   Cancer (HCC)    ovarian, recurrent on chemo (carboplatin, doxorubicin) at [facility]   Colon polyp    Depression    Full dentures    GERD (gastroesophageal reflux disease)    HLD (hyperlipidemia) 12/03/2012   Hypertension    Hypothyroidism    IBS (irritable bowel syndrome)    Neck mass    Nephrostomy status (HCC)    Neuropathy    feet   Rheumatoid arthritis (HCC)    at [facility] ring    Wears glasses    Past Surgical History:  Procedure Laterality Date   ABDOMINAL HYSTERECTOMY     ABLATION     COLONOSCOPY     CYST REMOVAL NECK  05/13/12   EAR CYST EXCISION  05/13/2012   Procedure: CYST REMOVAL;  Surgeon: Axel Filler, MD;  Location: [facility] OR;  Service: General;  Laterality: Left;  excision of neck cyst   IR NEPHROSTOMY EXCHANGE LEFT  09/02/2021   THYROID SURGERY  10/2011 - approximate   ablation   TONSILLECTOMY     UPPER GI ENDOSCOPY     Family History  Problem Relation Age of Onset   Heart attack Mother    Hypertension Mother    Diabetes Mother    Heart disease Mother    CVA Father    Hypertension Father    Diabetes Father    Stroke Father    Heart attack Sister        CABG   Diabetes Sister    Stroke Sister    Hypertension  Sister    Hyperlipidemia Sister    Kidney disease Sister    Stroke Sister    Heart attack Sister    Cancer Brother        lung cancer - stage 4   Cancer Brother        skin    Heart disease Maternal Grandfather    Social History   Socioeconomic History   Marital status: Single    Spouse name: Not on file   Number of children: Not on file  Years of education: Not on file   Highest education level: Not on file  Occupational History   Not on file  Tobacco Use   Smoking status: Former    Years: 32    Types: Cigarettes    Quit date: 06/19/2011    Years since quitting: 11.4   Smokeless tobacco: Never  Vaping Use   Vaping Use: Never used  Substance and Sexual Activity   Alcohol use: No   Drug use: No   Sexual activity: Not on file  Other Topics Concern   Not on file  Social History Narrative   Not on file   Social Determinants of Health   Financial Resource Strain: Low Risk  (11/13/2022)   Overall Financial Resource Strain (CARDIA)    Difficulty of Paying Living Expenses: Not hard at all  Food Insecurity: No Food Insecurity (11/13/2022)   Hunger Vital Sign    Worried About Running Out of Food in the Last Year: Never true    Ran Out of Food in the Last Year: Never true  Transportation Needs: No Transportation Needs (11/13/2022)   PRAPARE - Administrator, Civil Service (Medical): No    Lack of Transportation (Non-Medical): No  Physical Activity: Inactive (11/13/2022)   Exercise Vital Sign    Days of Exercise per Week: 0 days    Minutes of Exercise per Session: 0 min  Stress: Stress Concern Present (11/13/2022)   [employer] of Occupational Health - Occupational Stress Questionnaire    Feeling of Stress : To some extent  Social Connections: Unknown (11/13/2022)   Social Connection and Isolation Panel [NHANES]    Frequency of Communication with Friends and Family: More than three times a week    Frequency of Social Gatherings with Friends and Family:  Patient declined    Attends Religious Services: Patient declined    Database administrator or Organizations: Yes    Attends Engineer, structural: Patient declined    Marital Status: Never married    Tobacco Counseling Counseling given: Not Answered   Clinical Intake:  Pre-visit preparation completed: Yes  Pain : No/denies pain     Diabetes: No  How often do you need to have someone help you when you read instructions, pamphlets, or other written materials from your doctor or pharmacy?: 1 - Never  Interpreter Needed?: No  Information entered by :: Kandis Fantasia LPN   Activities of Daily Living    11/13/2022    2:40 PM 11/10/2022    3:45 PM  In your present state of health, do you have any difficulty performing the following activities:  Hearing? 0 0  Vision? 1 1  Difficulty concentrating or making decisions? 0 0  Walking or climbing stairs? 1 1  Dressing or bathing? 0 0  Doing errands, shopping? 1 1  Preparing Food and eating ? N N  Using the Toilet? N N  In the past six months, have you accidently leaked urine? N N  Do you have problems with loss of bowel control? N N  Managing your Medications? N N  Managing your Finances? N N  Housekeeping or managing your Housekeeping? Malvin Johns    Patient Care Team: Donita Brooks, MD as PCP - General (Family Medicine) Helane Gunther, DPM as Consulting Physician (Podiatry) Leida Lauth, MD as Referring Physician (Obstetrics) Lorina Rabon, MD as Referring Physician (Internal Medicine) Brayton El, MD as Referring Physician (Internal Medicine)  Indicate any recent Medical Services you may have received  from other than [facility] providers in the past year (date may be approximate).     Assessment:   This is a routine wellness examination for Neila.  Hearing/Vision screen Hearing Screening - Comments:: Denies hearing difficulties   Vision Screening - Comments:: No vision problems at this time   Dietary  issues and exercise activities discussed:     Goals Addressed             This Visit's Progress    Enjoy life and get the most out of everyday!       COMPLETED: Exercise 3x per week (30 min per time)       Try chair exercises.       Depression Screen    11/13/2022    2:38 PM 10/07/2022    9:47 AM 04/15/2022    8:33 AM 10/31/2021   11:11 AM 07/01/2021    4:08 PM 11/04/2017    3:03 PM  PHQ 2/9 Scores  PHQ - 2 Score 0 0 2 1 1 2   PHQ- 9 Score   9   2    Fall Risk    11/13/2022    2:39 PM 11/10/2022    3:45 PM 10/07/2022    9:47 AM 04/15/2022    8:33 AM 10/31/2021   11:16 AM  Fall Risk   Falls in the past year? 0 0 0 0 0  Number falls in past yr: 0  0 0 0  Injury with Fall? 0 0 0 0 0  Risk for fall due to : No Fall Risks  No Fall Risks No Fall Risks No Fall Risks  Follow up Falls prevention discussed;Education provided;Falls evaluation completed  Falls prevention discussed Falls prevention discussed Falls prevention discussed    MEDICARE RISK AT HOME:  Medicare Risk at Home - 11/13/22 1439     Any stairs in or around the home? No    If so, are there any without handrails? No    Home free of loose throw rugs in walkways, pet beds, electrical cords, etc? Yes    Adequate lighting in your home to reduce risk of falls? Yes    Life alert? No    Use of a cane, walker or w/c? Yes    Grab bars in the bathroom? No    Shower chair or bench in shower? Yes    Elevated toilet seat or a handicapped toilet? No             TIMED UP AND GO:  Was the test performed?  No    Cognitive Function:        11/13/2022    2:40 PM 10/31/2021   11:19 AM  6CIT Screen  What Year? 0 points 0 points  What month? 0 points 0 points  What time? 0 points 0 points  Count back from 20 0 points 0 points  Months in reverse 0 points 0 points  Repeat phrase 0 points 0 points  Total Score 0 points 0 points    Immunizations Immunization History  Administered Date(s) Administered   Fluad  Quad(high Dose 65+) 03/07/2021   Influenza,inj,Quad PF,6+ Mos 03/23/2015, 02/11/2016, 04/07/2017, 03/12/2018, 01/25/2019, 02/03/2020, 02/07/2022   Moderna Sars-Covid-2 Vaccination 09/05/2019, 10/05/2019   Pneumococcal Polysaccharide-23 11/04/2017   Tdap 08/22/2018    TDAP status: Up to date  Pneumococcal vaccine status: Up to date  Covid-19 vaccine status: Information provided on how to obtain vaccines.   Qualifies for Shingles Vaccine? Yes   Zostavax completed No  Shingrix Completed?: No.    Education has been provided regarding the importance of this vaccine. Patient has been advised to call insurance company to determine out of pocket expense if they have not yet received this vaccine. Advised may also receive vaccine at local pharmacy or Health Dept. Verbalized acceptance and understanding.  Screening Tests Health Maintenance  Topic Date Due   COVID-19 Vaccine (3 - Moderna risk series) 11/02/2019   Zoster Vaccines- Shingrix (1 of 2) 01/07/2023 (Originally 05/29/1979)   PAP SMEAR-Modifier  02/13/2023 (Originally 10/24/2017)   Colonoscopy  10/07/2023 (Originally 07/17/2020)   INFLUENZA VACCINE  12/18/2022   Lung Cancer Screening  10/07/2023   Medicare Annual Wellness (AWV)  11/13/2023   DTaP/Tdap/Td (2 - Td or Tdap) 08/21/2028   Hepatitis C Screening  Completed   HIV Screening  Completed   HPV VACCINES  Aged Out   MAMMOGRAM  Discontinued    Health Maintenance  Health Maintenance Due  Topic Date Due   COVID-19 Vaccine (3 - Moderna risk series) 11/02/2019    Colorectal cancer screening: No longer required.   Mammogram status: No longer required due to current health status.  Lung Cancer Screening: (Low Dose CT Chest recommended if Age 30-80 years, 20 pack-year currently smoking OR have quit w/in 15years.) does not qualify.   Lung Cancer Screening Referral: n/a  Additional Screening:  Hepatitis C Screening: does qualify; Completed 11/04/17  Vision Screening: Recommended  annual ophthalmology exams for early detection of glaucoma and other disorders of the eye. Is the patient up to date with their annual eye exam?  No  Who is the provider or what is the name of the office in which the patient attends annual eye exams? none If pt is not established with a provider, would they like to be referred to a provider to establish care? No .   Dental Screening: Recommended annual dental exams for proper oral hygiene  Community Resource Referral / Chronic Care Management: CRR required this visit?  No   CCM required this visit?  No     Plan:     I have personally reviewed and noted the following in the patient's chart:   Medical and social history Use of alcohol, tobacco or illicit drugs  Current medications and supplements including opioid prescriptions. Patient is currently taking opioid prescriptions. Information provided to patient regarding non-opioid alternatives. Patient advised to discuss non-opioid treatment plan with their provider. Functional ability and status Nutritional status Physical activity Advanced directives List of other physicians Hospitalizations, surgeries, and ER visits in previous 12 months Vitals Screenings to include cognitive, depression, and falls Referrals and appointments  In addition, I have reviewed and discussed with patient certain preventive protocols, quality metrics, and best practice recommendations. A written personalized care plan for preventive services as well as general preventive health recommendations were provided to patient.     Kandis Fantasia [place], [place]   1/61/0960   After Visit Summary: (MyChart) Due to this being a telephonic visit, the after visit summary with patients personalized plan was offered to patient via MyChart   Nurse Notes: FYI: Patient is coming in for appointment on 11/24/22 for discussion on palliative care vs. Hospice.  She has questions on different options with keeping pcp and if pcp  can continue to order paracentesis if needed and if she can continue maintenance medications for now.  Would also like more information on if [facility] can take her as a patient.

## 2022-11-13 NOTE — Patient Instructions (Signed)
Donna [name] , Thank you for taking time to come for your Medicare Wellness Visit. I appreciate your ongoing commitment to your health goals. Please review the following plan we discussed and let me know if I can assist you in the future.   These are the goals we discussed:  Goals      Enjoy life and get the most out of everyday!        This is a list of the screening recommended for you and due dates:  Health Maintenance  Topic Date Due   COVID-19 Vaccine (3 - Moderna risk series) 11/02/2019   Zoster (Shingles) Vaccine (1 of 2) 01/07/2023*   Pap Smear  02/13/2023*   Colon Cancer Screening  10/07/2023*   Flu Shot  12/18/2022   Screening for Lung Cancer  10/07/2023   Medicare Annual Wellness Visit  11/13/2023   DTaP/Tdap/Td vaccine (2 - Td or Tdap) 08/21/2028   Hepatitis C Screening  Completed   HIV Screening  Completed   HPV Vaccine  Aged Out   Mammogram  Discontinued  *Topic was postponed. The date shown is not the original due date.    Advanced directives: Information on Advanced Care Planning can be found at [facility] Advance Health Care Directives Advance Health Care Directives ([email or web address] Please bring a copy of your health care power of attorney and living will to the office to be added to your chart at your convenience.  Conditions/risks identified: Aim for 30 minutes of exercise or brisk walking, 6-8 glasses of water, and 5 servings of fruits and vegetables each day.  Next appointment: Follow up in one year for your annual wellness visit.   Preventive Care 40-64 Years, Female Preventive care refers to lifestyle choices and visits with your health care provider that can promote health and wellness. What does preventive care include? A yearly physical exam. This is also called an annual well check. Dental exams once or twice a year. Routine eye exams. Ask your health care provider how often you should have your eyes checked. Personal lifestyle  choices, including: Daily care of your teeth and gums. Regular physical activity. Eating a healthy diet. Avoiding tobacco and drug use. Limiting alcohol use. Practicing safe sex. Taking low-dose aspirin daily starting at age 62. Taking vitamin and mineral supplements as recommended by your health care provider. What happens during an annual well check? The services and screenings done by your health care provider during your annual well check will depend on your age, overall health, lifestyle risk factors, and family history of disease. Counseling  Your health care provider may ask you questions about your: Alcohol use. Tobacco use. Drug use. Emotional well-being. Home and relationship well-being. Sexual activity. Eating habits. Work and work Astronomer. Method of birth control. Menstrual cycle. Pregnancy history. Screening  You may have the following tests or measurements: Height, weight, and BMI. Blood pressure. Lipid and cholesterol levels. These may be checked every 5 years, or more frequently if you are over 45 years old. Skin check. Lung cancer screening. You may have this screening every year starting at age 35 if you have a 30-pack-year history of smoking and currently smoke or have quit within the past 15 years. Fecal occult blood test (FOBT) of the stool. You may have this test every year starting at age 49. Flexible sigmoidoscopy or colonoscopy. You may have a sigmoidoscopy every 5 years or a colonoscopy every 10 years starting at age 76. Hepatitis C blood test. Hepatitis B  blood test. Sexually transmitted disease (STD) testing. Diabetes screening. This is done by checking your blood sugar (glucose) after you have not eaten for a while (fasting). You may have this done every 1-3 years. Mammogram. This may be done every 1-2 years. Talk to your health care provider about when you should start having regular mammograms. This may depend on whether you have a family  history of breast cancer. BRCA-related cancer screening. This may be done if you have a family history of breast, ovarian, tubal, or peritoneal cancers. Pelvic exam and Pap test. This may be done every 3 years starting at age 69. Starting at age 72, this may be done every 5 years if you have a Pap test in combination with an HPV test. Bone density scan. This is done to screen for osteoporosis. You may have this scan if you are at high risk for osteoporosis. Discuss your test results, treatment options, and if necessary, the need for more tests with your health care provider. Vaccines  Your health care provider may recommend certain vaccines, such as: Influenza vaccine. This is recommended every year. Tetanus, diphtheria, and acellular pertussis (Tdap, Td) vaccine. You may need a Td booster every 10 years. Zoster vaccine. You may need this after age 81. Pneumococcal 13-valent conjugate (PCV13) vaccine. You may need this if you have certain conditions and were not previously vaccinated. Pneumococcal polysaccharide (PPSV23) vaccine. You may need one or two doses if you smoke cigarettes or if you have certain conditions. Talk to your health care provider about which screenings and vaccines you need and how often you need them. This information is not intended to replace advice given to you by your health care provider. Make sure you discuss any questions you have with your health care provider. Document Released: 06/01/2015 Document Revised: 01/23/2016 Document Reviewed: 03/06/2015 Elsevier Interactive Patient Education  2017 [employer].    Fall Prevention in the Home Falls can cause injuries. They can happen to people of all ages. There are many things you can do to make your home safe and to help prevent falls. What can I do on the outside of my home? Regularly fix the edges of walkways and driveways and fix any cracks. Remove anything that might make you trip as you walk through a door, such  as a raised step or threshold. Trim any bushes or trees on the path to your home. Use bright outdoor lighting. Clear any walking paths of anything that might make someone trip, such as rocks or tools. Regularly check to see if handrails are loose or broken. Make sure that both sides of any steps have handrails. Any raised decks and porches should have guardrails on the edges. Have any leaves, snow, or ice cleared regularly. Use sand or salt on walking paths during winter. Clean up any spills in your garage right away. This includes oil or grease spills. What can I do in the bathroom? Use night lights. Install grab bars by the toilet and in the tub and shower. Do not use towel bars as grab bars. Use non-skid mats or decals in the tub or shower. If you need to sit down in the shower, use a plastic, non-slip stool. Keep the floor dry. Clean up any water that spills on the floor as soon as it happens. Remove soap buildup in the tub or shower regularly. Attach bath mats securely with double-sided non-slip rug tape. Do not have throw rugs and other things on the floor that can make  you trip. What can I do in the bedroom? Use night lights. Make sure that you have a light by your bed that is easy to reach. Do not use any sheets or blankets that are too big for your bed. They should not hang down onto the floor. Have a firm chair that has side arms. You can use this for support while you get dressed. Do not have throw rugs and other things on the floor that can make you trip. What can I do in the kitchen? Clean up any spills right away. Avoid walking on wet floors. Keep items that you use a lot in easy-to-reach places. If you need to reach something above you, use a strong step stool that has a grab bar. Keep electrical cords out of the way. Do not use floor polish or wax that makes floors slippery. If you must use wax, use non-skid floor wax. Do not have throw rugs and other things on the  floor that can make you trip. What can I do with my stairs? Do not leave any items on the stairs. Make sure that there are handrails on both sides of the stairs and use them. Fix handrails that are broken or loose. Make sure that handrails are as long as the stairways. Check any carpeting to make sure that it is firmly attached to the stairs. Fix any carpet that is loose or worn. Avoid having throw rugs at the top or bottom of the stairs. If you do have throw rugs, attach them to the floor with carpet tape. Make sure that you have a light switch at the top of the stairs and the bottom of the stairs. If you do not have them, ask someone to add them for you. What else can I do to help prevent falls? Wear shoes that: Do not have high heels. Have rubber bottoms. Are comfortable and fit you well. Are closed at the toe. Do not wear sandals. If you use a stepladder: Make sure that it is fully opened. Do not climb a closed stepladder. Make sure that both sides of the stepladder are locked into place. Ask someone to hold it for you, if possible. Clearly mark and make sure that you can see: Any grab bars or handrails. First and last steps. Where the edge of each step is. Use tools that help you move around (mobility aids) if they are needed. These include: Canes. Walkers. Scooters. Crutches. Turn on the lights when you go into a dark area. Replace any light bulbs as soon as they burn out. Set up your furniture so you have a clear path. Avoid moving your furniture around. If any of your floors are uneven, fix them. If there are any pets around you, be aware of where they are. Review your medicines with your doctor. Some medicines can make you feel dizzy. This can increase your chance of falling. Ask your doctor what other things that you can do to help prevent falls. This information is not intended to replace advice given to you by your health care provider. Make sure you discuss any questions  you have with your health care provider. Document Released: 03/01/2009 Document Revised: 10/11/2015 Document Reviewed: 06/09/2014 Elsevier Interactive Patient Education  2017 [employer].

## 2022-11-13 NOTE — Telephone Encounter (Signed)
FYI: Patient is coming in for appointment on 11/24/22 for discussion on palliative care vs. Hospice.  She has questions on different options with keeping pcp and if pcp can continue to order paracentesis if needed and if she can continue maintenance medications for now.  Would also like more information on if [facility] can take her as a patient.

## 2022-11-17 ENCOUNTER — Other Ambulatory Visit: Payer: Self-pay | Admitting: Family Medicine

## 2022-11-24 ENCOUNTER — Ambulatory Visit (INDEPENDENT_AMBULATORY_CARE_PROVIDER_SITE_OTHER): Payer: Medicare (Managed Care) | Admitting: Family Medicine

## 2022-11-24 ENCOUNTER — Encounter: Payer: Self-pay | Admitting: Family Medicine

## 2022-11-24 VITALS — BP 118/64 | HR 87 | Temp 98.0°F | Ht 67.0 in | Wt 183.8 lb

## 2022-11-24 DIAGNOSIS — Z7901 Long term (current) use of anticoagulants: Secondary | ICD-10-CM

## 2022-11-24 DIAGNOSIS — C569 Malignant neoplasm of unspecified ovary: Secondary | ICD-10-CM

## 2022-11-24 DIAGNOSIS — I749 Embolism and thrombosis of unspecified artery: Secondary | ICD-10-CM | POA: Diagnosis not present

## 2022-11-24 DIAGNOSIS — I743 Embolism and thrombosis of arteries of the lower extremities: Secondary | ICD-10-CM | POA: Diagnosis not present

## 2022-11-24 LAB — PT WITH INR/FINGERSTICK
INR, fingerstick: 3.2 ratio — ABNORMAL HIGH
PT, fingerstick: 38.8 s — ABNORMAL HIGH (ref 10.5–13.1)

## 2022-11-24 MED ORDER — CLOTRIMAZOLE-BETAMETHASONE 1-0.05 % EX CREA
1.0000 | TOPICAL_CREAM | Freq: Every day | CUTANEOUS | 2 refills | Status: DC
Start: 2022-11-24 — End: 2023-02-28

## 2022-11-24 MED ORDER — OXYCODONE HCL 5 MG PO TABS: 5.0000 mg | ORAL_TABLET | Freq: Four times a day (QID) | ORAL | 0 refills | Status: DC | PRN

## 2022-11-24 NOTE — Progress Notes (Signed)
No ma'am  Subjective:    Patient ID: Donna [name], female    DOB: 1960-11-04, 62 y.o.   MRN: [id number]  Patient has a history of stage IV metastatic ovarian cancer.  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 Admitted in June for bacterial peritonitis with source being pleurx catheter.  I have reviewed oncology's most recent note and included it below for my reference:  "Recurrent metastatic, Platinum resistant ovarian cancer. Currently on topotecan 3 mg/m2. Consideration of cycle # 5. Labs are adequate to proceed with treatment. Based on her continued decline in functional status and progression of symptoms we will discontinue chemotherapy. We discussed ongoing management options. She has platinum resistant disease and has had 9 prior lines of therapy. She is having a harder time tolerating treatment and is having more symptoms of disease. We discussed clinical trials, standard of care treatment, or transitioning to comfort care. Based on her current symptoms and recent difficulty tolerating treatment she desires to proceed with comfort care and hospice services.  She will discuss the risks/benefits of coumadin with her PCP given her history of arterial clot and stent. . Hydronephrosis was noted on imaging with PCN placed 07/29/21. Hydronephrosis resolved with PCN, and PCN was replaced with ureteral stent placed 05/2022. Ureteral stent exchanged 09/25/22."  11/24/22 Here for follow up.  Patient would like to start hospice.  She is dealing with chronic abdominal pain due to malignancy.  She states that she has pain in her abdomen and in her lower back on a daily basis.  She is taking 2 oxycodone a day and sometimes needs up to 4.  She also reports early satiety and nausea although the Zofran that the oncologist gave her from doing seems to be helping.  She has an intertrigo like rash underneath her right breast.  Patient is at peace with seems to be  handling the difficult situation amazingly well.  We did check her Coumadin today and found her INR to be 3.2.  She is currently taking 5 mg on Monday Wednesday Friday and 10 mg on Tuesday, Thursday, Saturday, and Sunday.  We discussed whether the patient wants to continue Coumadin.  She has a history of an arterial clot.  She is extremely afraid that she will have another clot if she stops the Coumadin.  Therefore she prefers to stay on Coumadin at the present time even while starting hospice Past Medical History:  Diagnosis Date   Anxiety    Arterial thrombosis (HCC)    right sfa, popliteal artery.  Require thromectomy at [facility] and lifelong anticoagulation.   Cancer (HCC)    ovarian, recurrent on chemo (carboplatin, doxorubicin) at [facility]   Colon polyp    Depression    Full dentures    GERD (gastroesophageal reflux disease)    HLD (hyperlipidemia) 12/03/2012   Hypertension    Hypothyroidism    IBS (irritable bowel syndrome)    Neck mass    Nephrostomy status (HCC)    Neuropathy    feet   Rheumatoid arthritis (HCC)    at [facility] ring    Wears glasses    Past Surgical History:  Procedure Laterality Date   ABDOMINAL HYSTERECTOMY     ABLATION     COLONOSCOPY     CYST REMOVAL NECK  05/13/12   EAR CYST EXCISION  05/13/2012   Procedure: CYST REMOVAL;  Surgeon: Axel Filler, MD;  Location: [facility] OR;  Service: General;  Laterality: Left;  excision of neck cyst   IR NEPHROSTOMY EXCHANGE LEFT  09/02/2021   THYROID SURGERY  10/2011 - approximate   ablation   TONSILLECTOMY     UPPER GI ENDOSCOPY     Current Outpatient Medications on File Prior to Visit  Medication Sig Dispense Refill   ALPRAZolam (XANAX) 0.5 MG tablet Take 1 tablet (0.5 mg total) by mouth 3 (three) times daily as needed. (Patient taking differently: Take 0.5 mg by mouth 3 (three) times daily as needed for anxiety.) 30 tablet 0   amLODipine (NORVASC) 10 MG tablet Take 1 tablet by mouth once daily 90 tablet 0    aspirin EC 81 MG tablet Take 81 mg by mouth daily. Swallow whole.     atorvastatin (LIPITOR) 80 MG tablet TAKE 1 TABLET BY MOUTH ONCE DAILY ---PT  NEED  CPE  APPT  W/PCP  FOR  FURTHER  REFILLS 90 tablet 0   calcium carbonate (OS-CAL - DOSED IN MG OF ELEMENTAL CALCIUM) 1250 (500 Ca) MG tablet Take 1 tablet (500 mg of elemental calcium total) by mouth 3 (three) times daily with meals. (Patient taking differently: Take 1 tablet by mouth daily with breakfast.) 90 tablet 0   Cholecalciferol (VITAMIN D3) 50 MCG (2000 UT) capsule Take 2,000 Units by mouth daily.     gabapentin (NEURONTIN) 300 MG capsule TAKE 1 CAPSULE TWICE A DAY 180 capsule 3   leflunomide (ARAVA) 20 MG tablet Take 20 mg by mouth daily.     levothyroxine (SYNTHROID) 175 MCG tablet TAKE 1 TABLET DAILY BEFORE BREAKFAST (NEED APPOINTMENT TO CHECK TSH AS SOON AS POSSIBLE) 90 tablet 3   losartan (COZAAR) 50 MG tablet Take 1 tablet by mouth once daily 90 tablet 0   MAGNESIUM PO Take 800 mg by mouth daily.     omeprazole (PRILOSEC) 40 MG capsule Take 1 capsule (40 mg total) by mouth in the morning and at bedtime. (Patient taking differently: Take 40 mg by mouth daily as needed (acid reflux / GERD).) 60 capsule 3   ondansetron (ZOFRAN) 8 MG tablet Take 8 mg by mouth daily as needed for nausea, vomiting or refractory nausea / vomiting.     oxyCODONE (OXY IR/ROXICODONE) 5 MG immediate release tablet Take 1 tablet (5 mg total) by mouth 4 (four) times daily as needed for moderate pain. 60 tablet 0   potassium chloride SA (KLOR-CON M) 20 MEQ tablet TAKE 2  BY MOUTH ONCE DAILY 180 tablet 3   vitamin B-12 (CYANOCOBALAMIN) 1000 MCG tablet Take 1,000 mcg by mouth daily.     warfarin (COUMADIN) 5 MG tablet TAKE ONE AND ONE-HALF TABLETS ONCE DAILY ON TUESDAY, THURSDAY, SATURDAY AND SUNDAY, THEN TAKE 2 TABLETS ONCE DAILY ON OTHER DAYS 135 tablet 3   No current facility-administered medications on file prior to visit.   Allergies  Allergen Reactions    Paclitaxel Other (See Comments)    Flushing, Itching, Throat tightness   Social History   Socioeconomic History   Marital status: Single    Spouse name: Not on file   Number of children: Not on file   Years of education: Not on file   Highest education level: Associate degree: occupational, Scientist, product/process development, or vocational program  Occupational History   Not on file  Tobacco Use   Smoking status: Former    Years: 32    Types: Cigarettes    Quit date: 06/19/2011    Years since quitting: 11.4   Smokeless tobacco: Never  Vaping Use   Vaping Use: Never used  Substance and Sexual Activity   Alcohol use: No   Drug use: No   Sexual activity: Not on file  Other Topics Concern   Not on file  Social History Narrative   Not on file   Social Determinants of Health   Financial Resource Strain: Medium Risk (11/22/2022)   Overall Financial Resource Strain (CARDIA)    Difficulty of Paying Living Expenses: Somewhat hard  Food Insecurity: No Food Insecurity (11/22/2022)   Hunger Vital Sign    Worried About Running Out of Food in the Last Year: Never true    Ran Out of Food in the Last Year: Never true  Transportation Needs: No Transportation Needs (11/22/2022)   PRAPARE - Administrator, Civil Service (Medical): No    Lack of Transportation (Non-Medical): No  Physical Activity: Inactive (11/22/2022)   Exercise Vital Sign    Days of Exercise per Week: 0 days    Minutes of Exercise per Session: 0 min  Stress: Stress Concern Present (11/22/2022)   [employer] of Occupational Health - Occupational Stress Questionnaire    Feeling of Stress : To some extent  Social Connections: Moderately Integrated (11/22/2022)   Social Connection and Isolation Panel [NHANES]    Frequency of Communication with Friends and Family: More than three times a week    Frequency of Social Gatherings with Friends and Family: Once a week    Attends Religious Services: More than 4 times per year    Active Member of  [employer] or Organizations: Yes    Attends Banker Meetings: 1 to 4 times per year    Marital Status: Never married  Intimate Partner Violence: Not At Risk (11/13/2022)   Humiliation, Afraid, Rape, and Kick questionnaire    Fear of Current or Ex-Partner: No    Emotionally Abused: No    Physically Abused: No    Sexually Abused: No      Review of Systems  All other systems reviewed and are negative.      Objective:   Physical Exam Vitals reviewed.  Constitutional:      General: She is not in acute distress.    Appearance: She is well-developed and normal weight. She is not ill-appearing or toxic-appearing.  HENT:     Right Ear: Tympanic membrane is not perforated, erythematous, retracted or bulging.  Cardiovascular:     Rate and Rhythm: Regular rhythm. Tachycardia present.     Heart sounds: Normal heart sounds.  Pulmonary:     Effort: Pulmonary effort is normal. Tachypnea present. No accessory muscle usage or respiratory distress.     Breath sounds: No stridor. No decreased breath sounds, wheezing, rhonchi or rales.  Abdominal:     Palpations: Abdomen is soft.     Tenderness: There is abdominal tenderness. There is no guarding.  Musculoskeletal:     Lumbar back: Tenderness present. Decreased range of motion.     Right lower leg: No edema.     Left lower leg: No edema.  Neurological:     Mental Status: She is alert.           Assessment & Plan:  Malignant neoplasm of ovary, unspecified laterality (HCC) - Plan: Ambulatory referral to Hospice, PT with INR/Fingerstick  Anticoagulant long-term use  Arterial thrombosis (HCC) My heart goes out to this patient.  In accordance with her wishes I will consult hospice.  She wants to continue Coumadin at the present time have  asked the patient to decrease Coumadin to 10 mg on Monday and Friday and 5 mg on all other days.  I want her to skip today's dose.  Recheck INR in 4 weeks.  I will refill the patient's oxycodone  and I will give her 180 tablets.  If she is taking 2 a day this would last 3 months.  I am doing this to try to decrease the amount of time she needs to contact the office.  I will treat the intertrigo like rash under her right breast with Lotrisone cream twice daily for 14 days

## 2022-12-22 ENCOUNTER — Other Ambulatory Visit: Payer: Medicare (Managed Care)

## 2022-12-22 DIAGNOSIS — D689 Coagulation defect, unspecified: Secondary | ICD-10-CM

## 2022-12-22 DIAGNOSIS — Z7901 Long term (current) use of anticoagulants: Secondary | ICD-10-CM | POA: Diagnosis not present

## 2022-12-22 LAB — PT WITH INR/FINGERSTICK
INR, fingerstick: 2.8 ratio — ABNORMAL HIGH
PT, fingerstick: 33.4 s — ABNORMAL HIGH (ref 10.5–13.1)

## 2022-12-31 ENCOUNTER — Ambulatory Visit: Payer: Medicare (Managed Care) | Admitting: Podiatry

## 2023-01-02 DIAGNOSIS — R0902 Hypoxemia: Secondary | ICD-10-CM | POA: Diagnosis not present

## 2023-01-02 DIAGNOSIS — C787 Secondary malignant neoplasm of liver and intrahepatic bile duct: Secondary | ICD-10-CM | POA: Diagnosis not present

## 2023-01-02 DIAGNOSIS — E860 Dehydration: Secondary | ICD-10-CM | POA: Diagnosis not present

## 2023-01-02 DIAGNOSIS — Z7901 Long term (current) use of anticoagulants: Secondary | ICD-10-CM | POA: Diagnosis not present

## 2023-01-02 DIAGNOSIS — C569 Malignant neoplasm of unspecified ovary: Secondary | ICD-10-CM | POA: Diagnosis not present

## 2023-01-02 DIAGNOSIS — Z86718 Personal history of other venous thrombosis and embolism: Secondary | ICD-10-CM | POA: Diagnosis not present

## 2023-01-02 DIAGNOSIS — D649 Anemia, unspecified: Secondary | ICD-10-CM | POA: Diagnosis not present

## 2023-01-02 DIAGNOSIS — J90 Pleural effusion, not elsewhere classified: Secondary | ICD-10-CM | POA: Diagnosis not present

## 2023-01-02 DIAGNOSIS — C563 Malignant neoplasm of bilateral ovaries: Secondary | ICD-10-CM | POA: Diagnosis not present

## 2023-01-05 ENCOUNTER — Ambulatory Visit (INDEPENDENT_AMBULATORY_CARE_PROVIDER_SITE_OTHER): Payer: Medicare (Managed Care) | Admitting: Family Medicine

## 2023-01-05 ENCOUNTER — Encounter: Payer: Self-pay | Admitting: Family Medicine

## 2023-01-05 VITALS — BP 120/70 | HR 95 | Temp 97.6°F | Ht 67.0 in | Wt 170.4 lb

## 2023-01-05 DIAGNOSIS — R0902 Hypoxemia: Secondary | ICD-10-CM | POA: Diagnosis not present

## 2023-01-05 DIAGNOSIS — C569 Malignant neoplasm of unspecified ovary: Secondary | ICD-10-CM | POA: Diagnosis not present

## 2023-01-05 DIAGNOSIS — J910 Malignant pleural effusion: Secondary | ICD-10-CM

## 2023-01-05 MED ORDER — PREDNISONE 20 MG PO TABS: 40.0000 mg | ORAL_TABLET | Freq: Every day | ORAL | 0 refills | Status: DC

## 2023-01-05 NOTE — Progress Notes (Signed)
No ma'am  Subjective:    Patient ID: Donna [name], female    DOB: 17-Nov-1960, 62 y.o.   MRN: [id number]  Patient has a history of stage IV metastatic ovarian cancer.  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 Admitted in June for bacterial peritonitis with source being pleurx catheter.  I have reviewed oncology's most recent note and included it below for my reference:  "Recurrent metastatic, Platinum resistant ovarian cancer. Currently on topotecan 3 mg/m2. Consideration of cycle # 5. Labs are adequate to proceed with treatment. Based on her continued decline in functional status and progression of symptoms we will discontinue chemotherapy. We discussed ongoing management options. She has platinum resistant disease and has had 9 prior lines of therapy. She is having a harder time tolerating treatment and is having more symptoms of disease. We discussed clinical trials, standard of care treatment, or transitioning to comfort care. Based on her current symptoms and recent difficulty tolerating treatment she desires to proceed with comfort care and hospice services.  She will discuss the risks/benefits of coumadin with her PCP given her history of arterial clot and stent. . Hydronephrosis was noted on imaging with PCN placed 07/29/21. Hydronephrosis resolved with PCN, and PCN was replaced with ureteral stent placed 05/2022. Ureteral stent exchanged 09/25/22."  11/24/22 Here for follow up.  Patient would like to start hospice.  She is dealing with chronic abdominal pain due to malignancy.  She states that she has pain in her abdomen and in her lower back on a daily basis.  She is taking 2 oxycodone a day and sometimes needs up to 4.  She also reports early satiety and nausea although the Zofran that the oncologist gave her from doing seems to be helping.  She has an intertrigo like rash underneath her right breast.  Patient is at peace with seems to be  handling the difficult situation amazingly well.  We did check her Coumadin today and found her INR to be 3.2.  She is currently taking 5 mg on Monday Wednesday Friday and 10 mg on Tuesday, Thursday, Saturday, and Sunday.  We discussed whether the patient wants to continue Coumadin.  She has a history of an arterial clot.  She is extremely afraid that she will have another clot if she stops the Coumadin.  Therefore she prefers to stay on Coumadin at the present time even while starting hospice  At that time, my plan was: My heart goes out to this patient.  In accordance with her wishes I will consult hospice.  She wants to continue Coumadin at the present time have asked the patient to decrease Coumadin to 10 mg on Monday and Friday and 5 mg on all other days.  I want her to skip today's dose.  Recheck INR in 4 weeks.  I will refill the patient's oxycodone and I will give her 180 tablets.  If she is taking 2 a day this would last 3 months.  I am doing this to try to decrease the amount of time she needs to contact the office.  I will treat the intertrigo like rash under her right breast with Lotrisone cream twice daily for 14 days  01/05/23 Patient went to [facility] over the weekend with shortness of breath and pain under her right scapula.  Patient was diagnosed with malignant pleural effusions.  Oncology has determined that the patient has no other treatment options for her malignancy.  However the patient is concerned because on hospice she was told that she could not have a thoracentesis.  She would like a thoracentesis for symptomatic relief to help with the shortness of breath and dyspnea.  She reports severe dyspnea and in fact is on oxygen 2 L via nasal cannula to adapt home care.  She was discharged from the hospital with this.  She is interested in switching from hospice to palliative care so that she can get a therapeutic thoracentesis if possible. Past Medical History:  Diagnosis Date    Anxiety    Arterial thrombosis (HCC)    right sfa, popliteal artery.  Require thromectomy at [facility] and lifelong anticoagulation.   Cancer (HCC)    ovarian, recurrent on chemo (carboplatin, doxorubicin) at [facility]   Colon polyp    Depression    Full dentures    GERD (gastroesophageal reflux disease)    HLD (hyperlipidemia) 12/03/2012   Hypertension    Hypothyroidism    IBS (irritable bowel syndrome)    Neck mass    Nephrostomy status (HCC)    Neuropathy    feet   Rheumatoid arthritis (HCC)    at [facility]    Wears glasses    Past Surgical History:  Procedure Laterality Date   ABDOMINAL HYSTERECTOMY     ABLATION     COLONOSCOPY     CYST REMOVAL NECK  05/13/12   EAR CYST EXCISION  05/13/2012   Procedure: CYST REMOVAL;  Surgeon: Axel Filler, MD;  Location: [facility] OR;  Service: General;  Laterality: Left;  excision of neck cyst   IR NEPHROSTOMY EXCHANGE LEFT  09/02/2021   THYROID SURGERY  10/2011 - approximate   ablation   TONSILLECTOMY     UPPER GI ENDOSCOPY     Current Outpatient Medications on File Prior to Visit  Medication Sig Dispense Refill   ALPRAZolam (XANAX) 0.5 MG tablet Take 1 tablet (0.5 mg total) by mouth 3 (three) times daily as needed. (Patient taking differently: Take 0.5 mg by mouth 3 (three) times daily as needed for anxiety.) 30 tablet 0   amLODipine (NORVASC) 10 MG tablet Take 1 tablet by mouth once daily 90 tablet 0   aspirin EC 81 MG tablet Take 81 mg by mouth daily. Swallow whole.     atorvastatin (LIPITOR) 80 MG tablet TAKE 1 TABLET BY MOUTH ONCE DAILY ---PT  NEED  CPE  APPT  W/PCP  FOR  FURTHER  REFILLS 90 tablet 0   calcium carbonate (OS-CAL - DOSED IN MG OF ELEMENTAL CALCIUM) 1250 (500 Ca) MG tablet Take 1 tablet (500 mg of elemental calcium total) by mouth 3 (three) times daily with meals. (Patient taking differently: Take 1 tablet by mouth daily with breakfast.) 90 tablet 0   Cholecalciferol (VITAMIN D3) 50 MCG (2000 UT) capsule Take 2,000  Units by mouth daily.     clotrimazole-betamethasone (LOTRISONE) cream Apply 1 Application topically daily. 30 g 2   gabapentin (NEURONTIN) 300 MG capsule TAKE 1 CAPSULE TWICE A DAY 180 capsule 3   leflunomide (ARAVA) 20 MG tablet Take 20 mg by mouth daily.     levothyroxine (SYNTHROID) 175 MCG tablet TAKE 1 TABLET DAILY BEFORE BREAKFAST (NEED APPOINTMENT TO CHECK TSH AS SOON AS POSSIBLE) 90 tablet 3   losartan (COZAAR) 50 MG tablet Take 1 tablet by mouth once daily 90 tablet 0   MAGNESIUM PO Take 800 mg by mouth daily.     omeprazole (PRILOSEC) 40 MG capsule Take  1 capsule (40 mg total) by mouth in the morning and at bedtime. (Patient taking differently: Take 40 mg by mouth daily as needed (acid reflux / GERD).) 60 capsule 3   ondansetron (ZOFRAN) 8 MG tablet Take 8 mg by mouth daily as needed for nausea, vomiting or refractory nausea / vomiting.     oxyCODONE (OXY IR/ROXICODONE) 5 MG immediate release tablet Take 1 tablet (5 mg total) by mouth 4 (four) times daily as needed for moderate pain. 180 tablet 0   potassium chloride SA (KLOR-CON M) 20 MEQ tablet TAKE 2  BY MOUTH ONCE DAILY 180 tablet 3   vitamin B-12 (CYANOCOBALAMIN) 1000 MCG tablet Take 1,000 mcg by mouth daily.     warfarin (COUMADIN) 5 MG tablet TAKE ONE AND ONE-HALF TABLETS ONCE DAILY ON TUESDAY, THURSDAY, SATURDAY AND SUNDAY, THEN TAKE 2 TABLETS ONCE DAILY ON OTHER DAYS 135 tablet 3   No current facility-administered medications on file prior to visit.   Allergies  Allergen Reactions   Paclitaxel Other (See Comments)    Flushing, Itching, Throat tightness   Social History   Socioeconomic History   Marital status: Single    Spouse name: Not on file   Number of children: Not on file   Years of education: Not on file   Highest education level: Associate degree: occupational, Scientist, product/process development, or vocational program  Occupational History   Not on file  Tobacco Use   Smoking status: Former    Current packs/day: 0.00    Types:  Cigarettes    Start date: 06/19/1979    Quit date: 06/19/2011    Years since quitting: 11.5   Smokeless tobacco: Never  Vaping Use   Vaping status: Never Used  Substance and Sexual Activity   Alcohol use: No   Drug use: No   Sexual activity: Not on file  Other Topics Concern   Not on file  Social History Narrative   Not on file   Social Determinants of Health   Financial Resource Strain: Medium Risk (11/22/2022)   Overall Financial Resource Strain (CARDIA)    Difficulty of Paying Living Expenses: Somewhat hard  Food Insecurity: No Food Insecurity (11/22/2022)   Hunger Vital Sign    Worried About Running Out of Food in the Last Year: Never true    Ran Out of Food in the Last Year: Never true  Transportation Needs: No Transportation Needs (11/22/2022)   PRAPARE - Administrator, Civil Service (Medical): No    Lack of Transportation (Non-Medical): No  Recent Concern: Transportation Needs - Unmet Transportation Needs (10/12/2022)   Received from [facility]   Transportation    In the past 12 months, has lack of reliable transportation kept you from medical appointments, meetings, work or from getting things needed for daily living? : Yes  Physical Activity: Inactive (11/22/2022)   Exercise Vital Sign    Days of Exercise per Week: 0 days    Minutes of Exercise per Session: 0 min  Stress: Stress Concern Present (11/22/2022)   [employer] of Occupational Health - Occupational Stress Questionnaire    Feeling of Stress : To some extent  Social Connections: Moderately Integrated (11/22/2022)   Social Connection and Isolation Panel [NHANES]    Frequency of Communication with Friends and Family: More than three times a week    Frequency of Social Gatherings with Friends and Family: Once a week    Attends Religious Services: More than 4 times per year    Active Member  of Clubs or Organizations: Yes    Attends Banker Meetings: 1 to 4 times per year    Marital  Status: Never married  Intimate Partner Violence: Not At Risk (11/13/2022)   Humiliation, Afraid, Rape, and Kick questionnaire    Fear of Current or Ex-Partner: No    Emotionally Abused: No    Physically Abused: No    Sexually Abused: No      Review of Systems  All other systems reviewed and are negative.      Objective:   Physical Exam Vitals reviewed.  Constitutional:      General: She is not in acute distress.    Appearance: She is well-developed and normal weight. She is not ill-appearing or toxic-appearing.  HENT:     Right Ear: Tympanic membrane is not perforated, erythematous, retracted or bulging.  Cardiovascular:     Rate and Rhythm: Regular rhythm. Tachycardia present.     Heart sounds: Normal heart sounds.  Pulmonary:     Effort: Pulmonary effort is normal. Tachypnea present. No accessory muscle usage or respiratory distress.     Breath sounds: No stridor. Examination of the right-lower field reveals decreased breath sounds. Examination of the left-lower field reveals decreased breath sounds. Decreased breath sounds present. No wheezing, rhonchi or rales.  Abdominal:     Palpations: Abdomen is soft.     Tenderness: There is abdominal tenderness. There is no guarding.  Musculoskeletal:     Lumbar back: Tenderness present. Decreased range of motion.     Right lower leg: No edema.     Left lower leg: No edema.  Neurological:     Mental Status: She is alert.           Assessment & Plan:  Malignant pleural effusion  Malignant neoplasm of ovary, unspecified laterality (HCC)  Hypoxia I will switch the patient from hospice to palliative care if that is what she chooses.  I have asked her to discuss with her hospice team to determine if hospice will cover therapeutic thoracentesis.  Meanwhile, I will consult interventional radiology to schedule the patient for therapeutic thoracentesis at [facility] of the right pleural space.  Start prednisone 40 mg daily  empirically to see if this will help with the malignant pleural effusion.  If effusion reaccumulate after her thoracentesis, we will discontinue prednisone altogether.  If effusion is slow to reaccumulate, we will slowly wean the patient off prednisone.  Meanwhile hold Coumadin until contacted by interventional radiology

## 2023-01-06 ENCOUNTER — Other Ambulatory Visit (HOSPITAL_COMMUNITY): Payer: Self-pay | Admitting: Hospice and Palliative Medicine

## 2023-01-06 ENCOUNTER — Telehealth: Payer: Self-pay | Admitting: Family Medicine

## 2023-01-06 DIAGNOSIS — R180 Malignant ascites: Secondary | ICD-10-CM

## 2023-01-06 DIAGNOSIS — J910 Malignant pleural effusion: Secondary | ICD-10-CM

## 2023-01-06 NOTE — Telephone Encounter (Signed)
Received call from Gene Middlesex with [facility]. They will cover for a therapeutic thoracentesis. Please call him with any questions at [phone].

## 2023-01-07 NOTE — Addendum Note (Signed)
Addended by: Venia Carbon K on: 01/07/2023 10:34 AM   Modules accepted: Orders

## 2023-01-12 ENCOUNTER — Ambulatory Visit (HOSPITAL_COMMUNITY)
Admission: RE | Admit: 2023-01-12 | Discharge: 2023-01-12 | Disposition: A | Discharge status: Home / Self Care | Payer: Medicare (Managed Care) | Source: Ambulatory Visit | Attending: Hospice and Palliative Medicine | Admitting: Hospice and Palliative Medicine

## 2023-01-12 ENCOUNTER — Ambulatory Visit (HOSPITAL_COMMUNITY)
Admission: RE | Admit: 2023-01-12 | Discharge: 2023-01-12 | Disposition: A | Discharge status: Home / Self Care | Payer: Medicare (Managed Care) | Source: Ambulatory Visit | Attending: Physician Assistant | Admitting: Physician Assistant

## 2023-01-12 VITALS — BP 122/67

## 2023-01-12 DIAGNOSIS — R180 Malignant ascites: Secondary | ICD-10-CM | POA: Insufficient documentation

## 2023-01-12 DIAGNOSIS — J9811 Atelectasis: Secondary | ICD-10-CM | POA: Diagnosis not present

## 2023-01-12 DIAGNOSIS — Z48813 Encounter for surgical aftercare following surgery on the respiratory system: Secondary | ICD-10-CM | POA: Diagnosis not present

## 2023-01-12 DIAGNOSIS — C569 Malignant neoplasm of unspecified ovary: Secondary | ICD-10-CM | POA: Diagnosis present

## 2023-01-12 DIAGNOSIS — J910 Malignant pleural effusion: Secondary | ICD-10-CM | POA: Diagnosis not present

## 2023-01-12 DIAGNOSIS — J90 Pleural effusion, not elsewhere classified: Secondary | ICD-10-CM | POA: Diagnosis not present

## 2023-01-12 HISTORY — PX: IR THORACENTESIS ASP PLEURAL SPACE W/IMG GUIDE: IMG5380

## 2023-01-12 MED ORDER — LIDOCAINE HCL 1 % IJ SOLN
INTRAMUSCULAR | Status: AC
  Filled 2023-01-12: qty 20

## 2023-01-12 MED ORDER — OXYCODONE HCL 5 MG PO TABS
ORAL_TABLET | ORAL | Status: AC
  Filled 2023-01-12: qty 1

## 2023-01-12 MED ORDER — OXYCODONE HCL 5 MG PO TABS
5.0000 mg | ORAL_TABLET | Freq: Once | ORAL | Status: AC
  Administered 2023-01-12: 5 mg via ORAL

## 2023-01-12 NOTE — Procedures (Signed)
PROCEDURE SUMMARY:  Successful US guided left thoracentesis. Yielded 650 mL of blood tinged fluid (pt on Coumadin) Patient tolerated procedure well. No immediate complications. EBL = trace   *Note* Patient with terminal ovarian cancer c/o profound discomfort today while here for procedure. Pain is due to her metastatic disease. She usually takes Oxycodone immediate release, 5 mg. She was due for a dose while here and asked if I would order a dose for her. I did order one Oxy tab for her which she took just prior to procedure.    Latravis Grine S Mickala Laton PA-C 01/12/2023 1:15 PM

## 2023-02-13 ENCOUNTER — Telehealth: Payer: Self-pay | Admitting: Family Medicine

## 2023-02-17 NOTE — Telephone Encounter (Signed)
Received call from patient's niece Toniann Fail to advise provider that the patient passed away last night.

## 2023-02-17 DEATH — deceased

## 2023-02-27 ENCOUNTER — Other Ambulatory Visit: Payer: Self-pay

## 2023-02-27 DIAGNOSIS — D689 Coagulation defect, unspecified: Secondary | ICD-10-CM

## 2023-02-27 DIAGNOSIS — I749 Embolism and thrombosis of unspecified artery: Secondary | ICD-10-CM

## 2023-02-27 NOTE — Telephone Encounter (Signed)
Prescription Request  02/27/2023  LOV: 01/05/23  What is the name of the medication or equipment? warfarin (COUMADIN) 5 MG tablet [[id number]]  Have you contacted your pharmacy to request a refill? Yes   Which pharmacy would you like this sent to?  [facility] - [place], [place] - [address] [place] [address] Phone: [phone] Fax: [phone]    Patient notified that their request is being sent to the clinical staff for review and that they should receive a response within 2 business days.   Please advise at [facility] [phone]

## 2023-02-27 NOTE — Telephone Encounter (Signed)
Requested medication (s) are due for refill today: yes  Requested medication (s) are on the active medication list: yes  Last refill:  02/20/22 #135/3  Future visit scheduled: no  Notes to clinic:  Unable to refill per protocol due to failed labs, no updated results. Spoke with Nanette, FC she is going to call pt to schedule PT/INR appt     Requested Prescriptions  Pending Prescriptions Disp Refills   warfarin (COUMADIN) 5 MG tablet 135 tablet 3    Sig: TAKE ONE AND ONE-HALF TABLETS ONCE DAILY ON TUESDAY, THURSDAY, SATURDAY AND SUNDAY, THEN TAKE 2 TABLETS ONCE DAILY ON OTHER DAYS     Hematology:  Anticoagulants - warfarin Failed - 02/27/2023 10:48 AM      Failed - Manual Review: If patient's warfarin is managed by Anti-Coag team, route request to them. If not, route request to the provider.      Failed - INR in normal range and within 30 days    INR, fingerstick  Date Value Ref Range Status  12/22/2022 2.8 (H) ratio Final    Comment:    INRs >2.9 may be falsely elevated in patients receiving either unfractionated Heparin or Low Molecular Weight Heparin. Follow up testing in a hospital laboratory may be helpful if clinically indicated.  . INR results of > or = 5.0 should be verified using the standard venipuncture procedure. Reference Range                     0.9-1.1 Moderate-intensity Warfarin Therapy 2.0-3.0 Higher-intensity Warfarin Therapy   3.0-4.0  .          Failed - HCT in normal range and within 360 days    HCT  Date Value Ref Range Status  10/07/2022 29.0 (L) 36.0 - 46.0 % Final  06/17/2011 40.8 35.0 - 47.0 % Final         Failed - Valid encounter within last 3 months    Recent Outpatient Visits           1 year ago Chest pain, unspecified type   [facility] Donita Brooks, MD   1 year ago Fever, unspecified fever cause   [facility] Pickard, Priscille Heidelberg, MD   1 year ago Arterial thrombosis [facility])   [facility] Donita Brooks, MD   2 years ago Arterial thrombosis [facility])   [facility] Donita Brooks, MD   2 years ago Arterial thrombosis [facility])   [facility] Pickard, Priscille Heidelberg, MD              Passed - Patient is not pregnant

## 2023-03-31 ENCOUNTER — Other Ambulatory Visit: Payer: Self-pay | Admitting: Family Medicine
